# Patient Record
Sex: Female | Born: 1935 | Race: White | Hispanic: No | Smoking: Former smoker
Health system: Southern US, Community
[De-identification: ages and names within clinical notes are randomized; demographics above are authoritative.]

## PROBLEM LIST (undated history)

## (undated) DIAGNOSIS — R918 Other nonspecific abnormal finding of lung field: Secondary | ICD-10-CM

## (undated) DIAGNOSIS — I10 Essential (primary) hypertension: Secondary | ICD-10-CM

## (undated) DIAGNOSIS — K7689 Other specified diseases of liver: Secondary | ICD-10-CM

## (undated) DIAGNOSIS — Z923 Personal history of irradiation: Secondary | ICD-10-CM

## (undated) DIAGNOSIS — R0600 Dyspnea, unspecified: Secondary | ICD-10-CM

## (undated) DIAGNOSIS — H409 Unspecified glaucoma: Secondary | ICD-10-CM

## (undated) DIAGNOSIS — J449 Chronic obstructive pulmonary disease, unspecified: Secondary | ICD-10-CM

## (undated) DIAGNOSIS — R042 Hemoptysis: Secondary | ICD-10-CM

## (undated) DIAGNOSIS — C50919 Malignant neoplasm of unspecified site of unspecified female breast: Secondary | ICD-10-CM

## (undated) HISTORY — PX: GALLBLADDER SURGERY: SHX652

## (undated) HISTORY — PX: EYE SURGERY: SHX253

---

## 1958-10-25 HISTORY — PX: TONSILLECTOMY: SUR1361

## 1986-10-25 HISTORY — PX: CHOLECYSTECTOMY: SHX55

## 2003-10-26 DIAGNOSIS — C50919 Malignant neoplasm of unspecified site of unspecified female breast: Secondary | ICD-10-CM

## 2003-10-26 HISTORY — PX: BREAST EXCISIONAL BIOPSY: SUR124

## 2003-10-26 HISTORY — DX: Malignant neoplasm of unspecified site of unspecified female breast: C50.919

## 2004-03-25 HISTORY — PX: BREAST LUMPECTOMY: SHX2

## 2004-04-01 ENCOUNTER — Encounter: Admission: RE | Admit: 2004-04-01 | Discharge: 2004-04-01 | Payer: Self-pay | Admitting: *Deleted

## 2004-04-13 ENCOUNTER — Encounter (HOSPITAL_COMMUNITY): Admission: RE | Admit: 2004-04-13 | Discharge: 2004-07-12 | Payer: Self-pay | Admitting: Surgery

## 2004-04-20 ENCOUNTER — Ambulatory Visit (HOSPITAL_BASED_OUTPATIENT_CLINIC_OR_DEPARTMENT_OTHER): Admission: RE | Admit: 2004-04-20 | Discharge: 2004-04-20 | Payer: Self-pay | Admitting: Surgery

## 2004-04-20 ENCOUNTER — Encounter: Admission: RE | Admit: 2004-04-20 | Discharge: 2004-04-20 | Payer: Self-pay | Admitting: Surgery

## 2004-04-20 ENCOUNTER — Ambulatory Visit (HOSPITAL_COMMUNITY): Admission: RE | Admit: 2004-04-20 | Discharge: 2004-04-20 | Payer: Self-pay | Admitting: Surgery

## 2004-07-25 ENCOUNTER — Ambulatory Visit: Payer: Self-pay | Admitting: Internal Medicine

## 2004-10-07 ENCOUNTER — Ambulatory Visit: Payer: Self-pay | Admitting: Internal Medicine

## 2004-10-25 ENCOUNTER — Ambulatory Visit: Payer: Self-pay | Admitting: Internal Medicine

## 2004-11-12 ENCOUNTER — Encounter: Admission: RE | Admit: 2004-11-12 | Discharge: 2004-11-12 | Payer: Self-pay | Admitting: Surgery

## 2004-12-24 ENCOUNTER — Ambulatory Visit: Payer: Self-pay | Admitting: Internal Medicine

## 2005-01-23 ENCOUNTER — Ambulatory Visit: Payer: Self-pay | Admitting: Internal Medicine

## 2005-04-06 ENCOUNTER — Encounter: Admission: RE | Admit: 2005-04-06 | Discharge: 2005-04-06 | Payer: Self-pay | Admitting: Surgery

## 2005-05-04 ENCOUNTER — Ambulatory Visit: Payer: Self-pay | Admitting: Internal Medicine

## 2005-05-25 ENCOUNTER — Ambulatory Visit: Payer: Self-pay | Admitting: Internal Medicine

## 2005-07-01 ENCOUNTER — Ambulatory Visit: Payer: Self-pay | Admitting: Internal Medicine

## 2005-11-02 ENCOUNTER — Ambulatory Visit: Payer: Self-pay | Admitting: Internal Medicine

## 2005-11-25 ENCOUNTER — Ambulatory Visit: Payer: Self-pay | Admitting: Internal Medicine

## 2006-04-04 ENCOUNTER — Ambulatory Visit: Payer: Self-pay | Admitting: Oncology

## 2006-04-24 ENCOUNTER — Ambulatory Visit: Payer: Self-pay | Admitting: Oncology

## 2006-08-04 ENCOUNTER — Ambulatory Visit: Payer: Self-pay | Admitting: Radiation Oncology

## 2006-10-25 HISTORY — PX: JOINT REPLACEMENT: SHX530

## 2006-11-03 ENCOUNTER — Ambulatory Visit: Payer: Self-pay | Admitting: Oncology

## 2006-11-25 ENCOUNTER — Ambulatory Visit: Payer: Self-pay | Admitting: Oncology

## 2006-12-07 ENCOUNTER — Other Ambulatory Visit: Payer: Self-pay

## 2006-12-08 ENCOUNTER — Inpatient Hospital Stay: Payer: Self-pay | Admitting: Unknown Physician Specialty

## 2007-04-25 ENCOUNTER — Ambulatory Visit: Payer: Self-pay | Admitting: Internal Medicine

## 2007-05-12 ENCOUNTER — Ambulatory Visit: Payer: Self-pay | Admitting: Internal Medicine

## 2007-05-26 ENCOUNTER — Ambulatory Visit: Payer: Self-pay | Admitting: Internal Medicine

## 2007-07-26 ENCOUNTER — Ambulatory Visit: Payer: Self-pay | Admitting: Radiation Oncology

## 2007-08-17 ENCOUNTER — Ambulatory Visit: Payer: Self-pay | Admitting: Radiation Oncology

## 2007-08-26 ENCOUNTER — Ambulatory Visit: Payer: Self-pay | Admitting: Radiation Oncology

## 2007-10-26 ENCOUNTER — Ambulatory Visit: Payer: Self-pay | Admitting: Internal Medicine

## 2007-11-08 ENCOUNTER — Ambulatory Visit: Payer: Self-pay | Admitting: Internal Medicine

## 2007-11-26 ENCOUNTER — Ambulatory Visit: Payer: Self-pay | Admitting: Internal Medicine

## 2008-04-24 ENCOUNTER — Ambulatory Visit: Payer: Self-pay | Admitting: Internal Medicine

## 2008-05-25 ENCOUNTER — Ambulatory Visit: Payer: Self-pay | Admitting: Internal Medicine

## 2008-07-25 ENCOUNTER — Ambulatory Visit: Payer: Self-pay | Admitting: Radiation Oncology

## 2008-07-30 ENCOUNTER — Ambulatory Visit: Payer: Self-pay | Admitting: Internal Medicine

## 2008-08-25 ENCOUNTER — Ambulatory Visit: Payer: Self-pay | Admitting: Internal Medicine

## 2008-08-25 ENCOUNTER — Ambulatory Visit: Payer: Self-pay | Admitting: Radiation Oncology

## 2008-10-25 ENCOUNTER — Ambulatory Visit: Payer: Self-pay | Admitting: Internal Medicine

## 2008-11-05 ENCOUNTER — Ambulatory Visit: Payer: Self-pay | Admitting: Internal Medicine

## 2008-11-25 ENCOUNTER — Ambulatory Visit: Payer: Self-pay | Admitting: Internal Medicine

## 2009-07-25 ENCOUNTER — Ambulatory Visit: Payer: Self-pay | Admitting: Internal Medicine

## 2009-08-07 ENCOUNTER — Ambulatory Visit: Payer: Self-pay | Admitting: Internal Medicine

## 2009-08-25 ENCOUNTER — Ambulatory Visit: Payer: Self-pay | Admitting: Internal Medicine

## 2010-10-25 HISTORY — PX: COLONOSCOPY: SHX174

## 2011-07-06 ENCOUNTER — Ambulatory Visit: Payer: Self-pay | Admitting: Gastroenterology

## 2011-12-27 ENCOUNTER — Ambulatory Visit: Payer: Self-pay | Admitting: Internal Medicine

## 2012-03-28 DIAGNOSIS — L219 Seborrheic dermatitis, unspecified: Secondary | ICD-10-CM | POA: Insufficient documentation

## 2012-03-28 DIAGNOSIS — N189 Chronic kidney disease, unspecified: Secondary | ICD-10-CM | POA: Insufficient documentation

## 2012-07-25 ENCOUNTER — Ambulatory Visit: Payer: Self-pay | Admitting: Internal Medicine

## 2012-07-27 ENCOUNTER — Ambulatory Visit: Payer: Self-pay | Admitting: Internal Medicine

## 2013-03-15 ENCOUNTER — Ambulatory Visit: Payer: Self-pay | Admitting: Internal Medicine

## 2013-08-08 ENCOUNTER — Ambulatory Visit: Payer: Self-pay

## 2014-08-12 ENCOUNTER — Ambulatory Visit: Payer: Self-pay

## 2014-12-13 DIAGNOSIS — L920 Granuloma annulare: Secondary | ICD-10-CM | POA: Insufficient documentation

## 2015-05-27 ENCOUNTER — Other Ambulatory Visit: Payer: Self-pay | Admitting: Physician Assistant

## 2015-05-27 DIAGNOSIS — M5442 Lumbago with sciatica, left side: Secondary | ICD-10-CM

## 2015-05-27 DIAGNOSIS — R9389 Abnormal findings on diagnostic imaging of other specified body structures: Secondary | ICD-10-CM

## 2015-05-29 ENCOUNTER — Ambulatory Visit
Admission: RE | Admit: 2015-05-29 | Discharge: 2015-05-29 | Disposition: A | Discharge status: Home / Self Care | Payer: Medicare Other | Source: Ambulatory Visit | Attending: Physician Assistant | Admitting: Physician Assistant

## 2015-05-29 DIAGNOSIS — R934 Abnormal findings on diagnostic imaging of urinary organs: Secondary | ICD-10-CM | POA: Insufficient documentation

## 2015-05-29 DIAGNOSIS — M5442 Lumbago with sciatica, left side: Secondary | ICD-10-CM | POA: Insufficient documentation

## 2015-05-29 DIAGNOSIS — R938 Abnormal findings on diagnostic imaging of other specified body structures: Secondary | ICD-10-CM | POA: Diagnosis not present

## 2015-05-29 DIAGNOSIS — R9389 Abnormal findings on diagnostic imaging of other specified body structures: Secondary | ICD-10-CM

## 2015-06-02 ENCOUNTER — Other Ambulatory Visit: Payer: Self-pay | Admitting: Physician Assistant

## 2015-06-02 DIAGNOSIS — M5442 Lumbago with sciatica, left side: Secondary | ICD-10-CM

## 2015-06-03 ENCOUNTER — Ambulatory Visit
Admission: RE | Admit: 2015-06-03 | Discharge: 2015-06-03 | Disposition: A | Discharge status: Home / Self Care | Payer: Medicare Other | Source: Ambulatory Visit | Attending: Physician Assistant | Admitting: Physician Assistant

## 2015-06-03 ENCOUNTER — Other Ambulatory Visit: Payer: Self-pay | Admitting: Primary Care

## 2015-06-03 ENCOUNTER — Ambulatory Visit
Admission: RE | Admit: 2015-06-03 | Discharge: 2015-06-03 | Disposition: A | Discharge status: Home / Self Care | Payer: Medicare Other | Source: Ambulatory Visit | Attending: Primary Care | Admitting: Primary Care

## 2015-06-03 DIAGNOSIS — M5442 Lumbago with sciatica, left side: Secondary | ICD-10-CM

## 2015-06-03 DIAGNOSIS — M79605 Pain in left leg: Secondary | ICD-10-CM

## 2015-06-03 DIAGNOSIS — M5126 Other intervertebral disc displacement, lumbar region: Secondary | ICD-10-CM | POA: Diagnosis not present

## 2015-06-03 DIAGNOSIS — M5416 Radiculopathy, lumbar region: Secondary | ICD-10-CM | POA: Diagnosis not present

## 2015-06-16 DIAGNOSIS — M5432 Sciatica, left side: Secondary | ICD-10-CM | POA: Insufficient documentation

## 2015-06-18 ENCOUNTER — Other Ambulatory Visit: Payer: Self-pay | Admitting: Infectious Diseases

## 2015-06-18 DIAGNOSIS — Z1231 Encounter for screening mammogram for malignant neoplasm of breast: Secondary | ICD-10-CM

## 2015-08-14 ENCOUNTER — Other Ambulatory Visit: Payer: Self-pay | Admitting: Infectious Diseases

## 2015-08-14 ENCOUNTER — Ambulatory Visit
Admission: RE | Admit: 2015-08-14 | Discharge: 2015-08-14 | Disposition: A | Discharge status: Home / Self Care | Payer: Medicare Other | Source: Ambulatory Visit | Attending: Infectious Diseases | Admitting: Infectious Diseases

## 2015-08-14 DIAGNOSIS — Z1231 Encounter for screening mammogram for malignant neoplasm of breast: Secondary | ICD-10-CM

## 2015-08-14 HISTORY — DX: Malignant neoplasm of unspecified site of unspecified female breast: C50.919

## 2015-11-06 DIAGNOSIS — I10 Essential (primary) hypertension: Secondary | ICD-10-CM | POA: Diagnosis not present

## 2015-11-06 DIAGNOSIS — N2581 Secondary hyperparathyroidism of renal origin: Secondary | ICD-10-CM | POA: Diagnosis not present

## 2015-11-06 DIAGNOSIS — D631 Anemia in chronic kidney disease: Secondary | ICD-10-CM | POA: Diagnosis not present

## 2015-11-06 DIAGNOSIS — R319 Hematuria, unspecified: Secondary | ICD-10-CM | POA: Diagnosis not present

## 2015-11-06 DIAGNOSIS — N183 Chronic kidney disease, stage 3 (moderate): Secondary | ICD-10-CM | POA: Diagnosis not present

## 2015-11-14 DIAGNOSIS — E785 Hyperlipidemia, unspecified: Secondary | ICD-10-CM | POA: Diagnosis not present

## 2015-11-14 DIAGNOSIS — I129 Hypertensive chronic kidney disease with stage 1 through stage 4 chronic kidney disease, or unspecified chronic kidney disease: Secondary | ICD-10-CM | POA: Diagnosis not present

## 2015-11-14 DIAGNOSIS — N183 Chronic kidney disease, stage 3 (moderate): Secondary | ICD-10-CM | POA: Diagnosis not present

## 2015-12-15 DIAGNOSIS — E7800 Pure hypercholesterolemia, unspecified: Secondary | ICD-10-CM | POA: Diagnosis not present

## 2015-12-15 DIAGNOSIS — I10 Essential (primary) hypertension: Secondary | ICD-10-CM | POA: Diagnosis not present

## 2015-12-22 DIAGNOSIS — E7801 Familial hypercholesterolemia: Secondary | ICD-10-CM | POA: Diagnosis not present

## 2015-12-22 DIAGNOSIS — R7301 Impaired fasting glucose: Secondary | ICD-10-CM | POA: Diagnosis not present

## 2015-12-22 DIAGNOSIS — L409 Psoriasis, unspecified: Secondary | ICD-10-CM | POA: Diagnosis not present

## 2015-12-22 DIAGNOSIS — I10 Essential (primary) hypertension: Secondary | ICD-10-CM | POA: Diagnosis not present

## 2015-12-22 DIAGNOSIS — N183 Chronic kidney disease, stage 3 (moderate): Secondary | ICD-10-CM | POA: Diagnosis not present

## 2016-01-09 DIAGNOSIS — J40 Bronchitis, not specified as acute or chronic: Secondary | ICD-10-CM | POA: Diagnosis not present

## 2016-01-23 DIAGNOSIS — H40003 Preglaucoma, unspecified, bilateral: Secondary | ICD-10-CM | POA: Diagnosis not present

## 2016-03-29 DIAGNOSIS — M5136 Other intervertebral disc degeneration, lumbar region: Secondary | ICD-10-CM | POA: Diagnosis not present

## 2016-03-29 DIAGNOSIS — M9903 Segmental and somatic dysfunction of lumbar region: Secondary | ICD-10-CM | POA: Diagnosis not present

## 2016-03-29 DIAGNOSIS — M9905 Segmental and somatic dysfunction of pelvic region: Secondary | ICD-10-CM | POA: Diagnosis not present

## 2016-03-29 DIAGNOSIS — M5417 Radiculopathy, lumbosacral region: Secondary | ICD-10-CM | POA: Diagnosis not present

## 2016-03-30 DIAGNOSIS — M9905 Segmental and somatic dysfunction of pelvic region: Secondary | ICD-10-CM | POA: Diagnosis not present

## 2016-03-30 DIAGNOSIS — M5136 Other intervertebral disc degeneration, lumbar region: Secondary | ICD-10-CM | POA: Diagnosis not present

## 2016-03-30 DIAGNOSIS — M9903 Segmental and somatic dysfunction of lumbar region: Secondary | ICD-10-CM | POA: Diagnosis not present

## 2016-03-30 DIAGNOSIS — M5417 Radiculopathy, lumbosacral region: Secondary | ICD-10-CM | POA: Diagnosis not present

## 2016-04-02 DIAGNOSIS — M9903 Segmental and somatic dysfunction of lumbar region: Secondary | ICD-10-CM | POA: Diagnosis not present

## 2016-04-02 DIAGNOSIS — M5417 Radiculopathy, lumbosacral region: Secondary | ICD-10-CM | POA: Diagnosis not present

## 2016-04-02 DIAGNOSIS — M5136 Other intervertebral disc degeneration, lumbar region: Secondary | ICD-10-CM | POA: Diagnosis not present

## 2016-04-02 DIAGNOSIS — M9905 Segmental and somatic dysfunction of pelvic region: Secondary | ICD-10-CM | POA: Diagnosis not present

## 2016-04-05 DIAGNOSIS — M5136 Other intervertebral disc degeneration, lumbar region: Secondary | ICD-10-CM | POA: Diagnosis not present

## 2016-04-05 DIAGNOSIS — M9905 Segmental and somatic dysfunction of pelvic region: Secondary | ICD-10-CM | POA: Diagnosis not present

## 2016-04-05 DIAGNOSIS — M9903 Segmental and somatic dysfunction of lumbar region: Secondary | ICD-10-CM | POA: Diagnosis not present

## 2016-04-05 DIAGNOSIS — M5417 Radiculopathy, lumbosacral region: Secondary | ICD-10-CM | POA: Diagnosis not present

## 2016-04-07 DIAGNOSIS — M5136 Other intervertebral disc degeneration, lumbar region: Secondary | ICD-10-CM | POA: Diagnosis not present

## 2016-04-07 DIAGNOSIS — M9903 Segmental and somatic dysfunction of lumbar region: Secondary | ICD-10-CM | POA: Diagnosis not present

## 2016-04-07 DIAGNOSIS — M5417 Radiculopathy, lumbosacral region: Secondary | ICD-10-CM | POA: Diagnosis not present

## 2016-04-07 DIAGNOSIS — M9905 Segmental and somatic dysfunction of pelvic region: Secondary | ICD-10-CM | POA: Diagnosis not present

## 2016-04-09 DIAGNOSIS — M9903 Segmental and somatic dysfunction of lumbar region: Secondary | ICD-10-CM | POA: Diagnosis not present

## 2016-04-09 DIAGNOSIS — M5136 Other intervertebral disc degeneration, lumbar region: Secondary | ICD-10-CM | POA: Diagnosis not present

## 2016-04-09 DIAGNOSIS — M9905 Segmental and somatic dysfunction of pelvic region: Secondary | ICD-10-CM | POA: Diagnosis not present

## 2016-04-09 DIAGNOSIS — M5417 Radiculopathy, lumbosacral region: Secondary | ICD-10-CM | POA: Diagnosis not present

## 2016-04-19 DIAGNOSIS — M5136 Other intervertebral disc degeneration, lumbar region: Secondary | ICD-10-CM | POA: Diagnosis not present

## 2016-04-19 DIAGNOSIS — M5417 Radiculopathy, lumbosacral region: Secondary | ICD-10-CM | POA: Diagnosis not present

## 2016-04-19 DIAGNOSIS — M9903 Segmental and somatic dysfunction of lumbar region: Secondary | ICD-10-CM | POA: Diagnosis not present

## 2016-04-19 DIAGNOSIS — M9905 Segmental and somatic dysfunction of pelvic region: Secondary | ICD-10-CM | POA: Diagnosis not present

## 2016-04-21 DIAGNOSIS — M5417 Radiculopathy, lumbosacral region: Secondary | ICD-10-CM | POA: Diagnosis not present

## 2016-04-21 DIAGNOSIS — M9903 Segmental and somatic dysfunction of lumbar region: Secondary | ICD-10-CM | POA: Diagnosis not present

## 2016-04-21 DIAGNOSIS — M5136 Other intervertebral disc degeneration, lumbar region: Secondary | ICD-10-CM | POA: Diagnosis not present

## 2016-04-21 DIAGNOSIS — M9905 Segmental and somatic dysfunction of pelvic region: Secondary | ICD-10-CM | POA: Diagnosis not present

## 2016-04-23 DIAGNOSIS — M5136 Other intervertebral disc degeneration, lumbar region: Secondary | ICD-10-CM | POA: Diagnosis not present

## 2016-04-23 DIAGNOSIS — M5417 Radiculopathy, lumbosacral region: Secondary | ICD-10-CM | POA: Diagnosis not present

## 2016-04-23 DIAGNOSIS — M9905 Segmental and somatic dysfunction of pelvic region: Secondary | ICD-10-CM | POA: Diagnosis not present

## 2016-04-23 DIAGNOSIS — M9903 Segmental and somatic dysfunction of lumbar region: Secondary | ICD-10-CM | POA: Diagnosis not present

## 2016-04-26 DIAGNOSIS — M9905 Segmental and somatic dysfunction of pelvic region: Secondary | ICD-10-CM | POA: Diagnosis not present

## 2016-04-26 DIAGNOSIS — M5417 Radiculopathy, lumbosacral region: Secondary | ICD-10-CM | POA: Diagnosis not present

## 2016-04-26 DIAGNOSIS — M5136 Other intervertebral disc degeneration, lumbar region: Secondary | ICD-10-CM | POA: Diagnosis not present

## 2016-04-26 DIAGNOSIS — M9903 Segmental and somatic dysfunction of lumbar region: Secondary | ICD-10-CM | POA: Diagnosis not present

## 2016-04-28 DIAGNOSIS — M9903 Segmental and somatic dysfunction of lumbar region: Secondary | ICD-10-CM | POA: Diagnosis not present

## 2016-04-28 DIAGNOSIS — M9905 Segmental and somatic dysfunction of pelvic region: Secondary | ICD-10-CM | POA: Diagnosis not present

## 2016-04-28 DIAGNOSIS — M5417 Radiculopathy, lumbosacral region: Secondary | ICD-10-CM | POA: Diagnosis not present

## 2016-04-28 DIAGNOSIS — M5136 Other intervertebral disc degeneration, lumbar region: Secondary | ICD-10-CM | POA: Diagnosis not present

## 2016-04-30 DIAGNOSIS — M9903 Segmental and somatic dysfunction of lumbar region: Secondary | ICD-10-CM | POA: Diagnosis not present

## 2016-04-30 DIAGNOSIS — M9905 Segmental and somatic dysfunction of pelvic region: Secondary | ICD-10-CM | POA: Diagnosis not present

## 2016-04-30 DIAGNOSIS — M5417 Radiculopathy, lumbosacral region: Secondary | ICD-10-CM | POA: Diagnosis not present

## 2016-04-30 DIAGNOSIS — M5136 Other intervertebral disc degeneration, lumbar region: Secondary | ICD-10-CM | POA: Diagnosis not present

## 2016-05-03 DIAGNOSIS — M9903 Segmental and somatic dysfunction of lumbar region: Secondary | ICD-10-CM | POA: Diagnosis not present

## 2016-05-03 DIAGNOSIS — M5136 Other intervertebral disc degeneration, lumbar region: Secondary | ICD-10-CM | POA: Diagnosis not present

## 2016-05-03 DIAGNOSIS — M9905 Segmental and somatic dysfunction of pelvic region: Secondary | ICD-10-CM | POA: Diagnosis not present

## 2016-05-03 DIAGNOSIS — M5417 Radiculopathy, lumbosacral region: Secondary | ICD-10-CM | POA: Diagnosis not present

## 2016-05-05 DIAGNOSIS — M9903 Segmental and somatic dysfunction of lumbar region: Secondary | ICD-10-CM | POA: Diagnosis not present

## 2016-05-05 DIAGNOSIS — M5417 Radiculopathy, lumbosacral region: Secondary | ICD-10-CM | POA: Diagnosis not present

## 2016-05-05 DIAGNOSIS — M5136 Other intervertebral disc degeneration, lumbar region: Secondary | ICD-10-CM | POA: Diagnosis not present

## 2016-05-05 DIAGNOSIS — M9905 Segmental and somatic dysfunction of pelvic region: Secondary | ICD-10-CM | POA: Diagnosis not present

## 2016-05-07 DIAGNOSIS — M9905 Segmental and somatic dysfunction of pelvic region: Secondary | ICD-10-CM | POA: Diagnosis not present

## 2016-05-07 DIAGNOSIS — M5136 Other intervertebral disc degeneration, lumbar region: Secondary | ICD-10-CM | POA: Diagnosis not present

## 2016-05-07 DIAGNOSIS — M5417 Radiculopathy, lumbosacral region: Secondary | ICD-10-CM | POA: Diagnosis not present

## 2016-05-07 DIAGNOSIS — M9903 Segmental and somatic dysfunction of lumbar region: Secondary | ICD-10-CM | POA: Diagnosis not present

## 2016-05-10 DIAGNOSIS — N183 Chronic kidney disease, stage 3 (moderate): Secondary | ICD-10-CM | POA: Diagnosis not present

## 2016-05-10 DIAGNOSIS — N2581 Secondary hyperparathyroidism of renal origin: Secondary | ICD-10-CM | POA: Diagnosis not present

## 2016-05-10 DIAGNOSIS — M9903 Segmental and somatic dysfunction of lumbar region: Secondary | ICD-10-CM | POA: Diagnosis not present

## 2016-05-10 DIAGNOSIS — I10 Essential (primary) hypertension: Secondary | ICD-10-CM | POA: Diagnosis not present

## 2016-05-10 DIAGNOSIS — M5417 Radiculopathy, lumbosacral region: Secondary | ICD-10-CM | POA: Diagnosis not present

## 2016-05-10 DIAGNOSIS — M5136 Other intervertebral disc degeneration, lumbar region: Secondary | ICD-10-CM | POA: Diagnosis not present

## 2016-05-10 DIAGNOSIS — R319 Hematuria, unspecified: Secondary | ICD-10-CM | POA: Diagnosis not present

## 2016-05-10 DIAGNOSIS — D631 Anemia in chronic kidney disease: Secondary | ICD-10-CM | POA: Diagnosis not present

## 2016-05-10 DIAGNOSIS — M9905 Segmental and somatic dysfunction of pelvic region: Secondary | ICD-10-CM | POA: Diagnosis not present

## 2016-05-10 DIAGNOSIS — I129 Hypertensive chronic kidney disease with stage 1 through stage 4 chronic kidney disease, or unspecified chronic kidney disease: Secondary | ICD-10-CM | POA: Diagnosis not present

## 2016-05-12 DIAGNOSIS — M9905 Segmental and somatic dysfunction of pelvic region: Secondary | ICD-10-CM | POA: Diagnosis not present

## 2016-05-12 DIAGNOSIS — M9903 Segmental and somatic dysfunction of lumbar region: Secondary | ICD-10-CM | POA: Diagnosis not present

## 2016-05-12 DIAGNOSIS — M5136 Other intervertebral disc degeneration, lumbar region: Secondary | ICD-10-CM | POA: Diagnosis not present

## 2016-05-12 DIAGNOSIS — M5417 Radiculopathy, lumbosacral region: Secondary | ICD-10-CM | POA: Diagnosis not present

## 2016-05-14 DIAGNOSIS — N183 Chronic kidney disease, stage 3 (moderate): Secondary | ICD-10-CM | POA: Diagnosis not present

## 2016-05-14 DIAGNOSIS — E785 Hyperlipidemia, unspecified: Secondary | ICD-10-CM | POA: Diagnosis not present

## 2016-05-14 DIAGNOSIS — I129 Hypertensive chronic kidney disease with stage 1 through stage 4 chronic kidney disease, or unspecified chronic kidney disease: Secondary | ICD-10-CM | POA: Diagnosis not present

## 2016-05-17 DIAGNOSIS — M9905 Segmental and somatic dysfunction of pelvic region: Secondary | ICD-10-CM | POA: Diagnosis not present

## 2016-05-17 DIAGNOSIS — M9903 Segmental and somatic dysfunction of lumbar region: Secondary | ICD-10-CM | POA: Diagnosis not present

## 2016-05-17 DIAGNOSIS — M5136 Other intervertebral disc degeneration, lumbar region: Secondary | ICD-10-CM | POA: Diagnosis not present

## 2016-05-17 DIAGNOSIS — M5417 Radiculopathy, lumbosacral region: Secondary | ICD-10-CM | POA: Diagnosis not present

## 2016-05-19 DIAGNOSIS — M9903 Segmental and somatic dysfunction of lumbar region: Secondary | ICD-10-CM | POA: Diagnosis not present

## 2016-05-19 DIAGNOSIS — M5136 Other intervertebral disc degeneration, lumbar region: Secondary | ICD-10-CM | POA: Diagnosis not present

## 2016-05-19 DIAGNOSIS — M5417 Radiculopathy, lumbosacral region: Secondary | ICD-10-CM | POA: Diagnosis not present

## 2016-05-19 DIAGNOSIS — M9905 Segmental and somatic dysfunction of pelvic region: Secondary | ICD-10-CM | POA: Diagnosis not present

## 2016-05-24 DIAGNOSIS — M9903 Segmental and somatic dysfunction of lumbar region: Secondary | ICD-10-CM | POA: Diagnosis not present

## 2016-05-24 DIAGNOSIS — M5417 Radiculopathy, lumbosacral region: Secondary | ICD-10-CM | POA: Diagnosis not present

## 2016-05-24 DIAGNOSIS — M9905 Segmental and somatic dysfunction of pelvic region: Secondary | ICD-10-CM | POA: Diagnosis not present

## 2016-05-24 DIAGNOSIS — M5136 Other intervertebral disc degeneration, lumbar region: Secondary | ICD-10-CM | POA: Diagnosis not present

## 2016-05-31 DIAGNOSIS — M9903 Segmental and somatic dysfunction of lumbar region: Secondary | ICD-10-CM | POA: Diagnosis not present

## 2016-05-31 DIAGNOSIS — M9905 Segmental and somatic dysfunction of pelvic region: Secondary | ICD-10-CM | POA: Diagnosis not present

## 2016-05-31 DIAGNOSIS — M5417 Radiculopathy, lumbosacral region: Secondary | ICD-10-CM | POA: Diagnosis not present

## 2016-05-31 DIAGNOSIS — M5136 Other intervertebral disc degeneration, lumbar region: Secondary | ICD-10-CM | POA: Diagnosis not present

## 2016-06-08 DIAGNOSIS — M9903 Segmental and somatic dysfunction of lumbar region: Secondary | ICD-10-CM | POA: Diagnosis not present

## 2016-06-08 DIAGNOSIS — M5136 Other intervertebral disc degeneration, lumbar region: Secondary | ICD-10-CM | POA: Diagnosis not present

## 2016-06-08 DIAGNOSIS — M5417 Radiculopathy, lumbosacral region: Secondary | ICD-10-CM | POA: Diagnosis not present

## 2016-06-08 DIAGNOSIS — M9905 Segmental and somatic dysfunction of pelvic region: Secondary | ICD-10-CM | POA: Diagnosis not present

## 2016-06-15 DIAGNOSIS — E7801 Familial hypercholesterolemia: Secondary | ICD-10-CM | POA: Diagnosis not present

## 2016-06-15 DIAGNOSIS — I10 Essential (primary) hypertension: Secondary | ICD-10-CM | POA: Diagnosis not present

## 2016-06-15 DIAGNOSIS — R7301 Impaired fasting glucose: Secondary | ICD-10-CM | POA: Diagnosis not present

## 2016-06-15 DIAGNOSIS — L409 Psoriasis, unspecified: Secondary | ICD-10-CM | POA: Diagnosis not present

## 2016-06-15 DIAGNOSIS — N183 Chronic kidney disease, stage 3 (moderate): Secondary | ICD-10-CM | POA: Diagnosis not present

## 2016-06-22 ENCOUNTER — Other Ambulatory Visit: Payer: Self-pay | Admitting: Infectious Diseases

## 2016-06-22 DIAGNOSIS — N183 Chronic kidney disease, stage 3 (moderate): Secondary | ICD-10-CM | POA: Diagnosis not present

## 2016-06-22 DIAGNOSIS — Z0000 Encounter for general adult medical examination without abnormal findings: Secondary | ICD-10-CM | POA: Diagnosis not present

## 2016-06-22 DIAGNOSIS — R7301 Impaired fasting glucose: Secondary | ICD-10-CM | POA: Diagnosis not present

## 2016-06-22 DIAGNOSIS — Z1231 Encounter for screening mammogram for malignant neoplasm of breast: Secondary | ICD-10-CM

## 2016-06-22 DIAGNOSIS — M5432 Sciatica, left side: Secondary | ICD-10-CM | POA: Diagnosis not present

## 2016-06-22 DIAGNOSIS — Z1239 Encounter for other screening for malignant neoplasm of breast: Secondary | ICD-10-CM | POA: Diagnosis not present

## 2016-06-22 DIAGNOSIS — E7801 Familial hypercholesterolemia: Secondary | ICD-10-CM | POA: Diagnosis not present

## 2016-06-22 DIAGNOSIS — I10 Essential (primary) hypertension: Secondary | ICD-10-CM | POA: Diagnosis not present

## 2016-07-07 DIAGNOSIS — M9905 Segmental and somatic dysfunction of pelvic region: Secondary | ICD-10-CM | POA: Diagnosis not present

## 2016-07-07 DIAGNOSIS — M5136 Other intervertebral disc degeneration, lumbar region: Secondary | ICD-10-CM | POA: Diagnosis not present

## 2016-07-07 DIAGNOSIS — M9903 Segmental and somatic dysfunction of lumbar region: Secondary | ICD-10-CM | POA: Diagnosis not present

## 2016-07-07 DIAGNOSIS — M5417 Radiculopathy, lumbosacral region: Secondary | ICD-10-CM | POA: Diagnosis not present

## 2016-07-21 DIAGNOSIS — H40003 Preglaucoma, unspecified, bilateral: Secondary | ICD-10-CM | POA: Diagnosis not present

## 2016-07-26 DIAGNOSIS — H40003 Preglaucoma, unspecified, bilateral: Secondary | ICD-10-CM | POA: Diagnosis not present

## 2016-08-03 DIAGNOSIS — R200 Anesthesia of skin: Secondary | ICD-10-CM | POA: Diagnosis not present

## 2016-08-18 ENCOUNTER — Other Ambulatory Visit: Payer: Self-pay | Admitting: Infectious Diseases

## 2016-08-18 ENCOUNTER — Ambulatory Visit
Admission: RE | Admit: 2016-08-18 | Discharge: 2016-08-18 | Disposition: A | Discharge status: Home / Self Care | Payer: Medicare Other | Source: Ambulatory Visit | Attending: Infectious Diseases | Admitting: Infectious Diseases

## 2016-08-18 DIAGNOSIS — Z1231 Encounter for screening mammogram for malignant neoplasm of breast: Secondary | ICD-10-CM | POA: Diagnosis not present

## 2016-08-18 HISTORY — DX: Personal history of irradiation: Z92.3

## 2016-11-09 DIAGNOSIS — I129 Hypertensive chronic kidney disease with stage 1 through stage 4 chronic kidney disease, or unspecified chronic kidney disease: Secondary | ICD-10-CM | POA: Diagnosis not present

## 2016-11-09 DIAGNOSIS — N2581 Secondary hyperparathyroidism of renal origin: Secondary | ICD-10-CM | POA: Diagnosis not present

## 2016-11-09 DIAGNOSIS — D631 Anemia in chronic kidney disease: Secondary | ICD-10-CM | POA: Diagnosis not present

## 2016-11-09 DIAGNOSIS — R319 Hematuria, unspecified: Secondary | ICD-10-CM | POA: Diagnosis not present

## 2016-11-09 DIAGNOSIS — N183 Chronic kidney disease, stage 3 (moderate): Secondary | ICD-10-CM | POA: Diagnosis not present

## 2016-11-16 DIAGNOSIS — R809 Proteinuria, unspecified: Secondary | ICD-10-CM | POA: Diagnosis not present

## 2016-11-16 DIAGNOSIS — N183 Chronic kidney disease, stage 3 (moderate): Secondary | ICD-10-CM | POA: Diagnosis not present

## 2016-11-16 DIAGNOSIS — I129 Hypertensive chronic kidney disease with stage 1 through stage 4 chronic kidney disease, or unspecified chronic kidney disease: Secondary | ICD-10-CM | POA: Diagnosis not present

## 2016-11-16 DIAGNOSIS — R319 Hematuria, unspecified: Secondary | ICD-10-CM | POA: Diagnosis not present

## 2016-12-22 DIAGNOSIS — R7301 Impaired fasting glucose: Secondary | ICD-10-CM | POA: Diagnosis not present

## 2016-12-22 DIAGNOSIS — N183 Chronic kidney disease, stage 3 (moderate): Secondary | ICD-10-CM | POA: Diagnosis not present

## 2016-12-22 DIAGNOSIS — E7801 Familial hypercholesterolemia: Secondary | ICD-10-CM | POA: Diagnosis not present

## 2016-12-22 DIAGNOSIS — I10 Essential (primary) hypertension: Secondary | ICD-10-CM | POA: Diagnosis not present

## 2016-12-29 DIAGNOSIS — N183 Chronic kidney disease, stage 3 (moderate): Secondary | ICD-10-CM | POA: Diagnosis not present

## 2016-12-29 DIAGNOSIS — E7801 Familial hypercholesterolemia: Secondary | ICD-10-CM | POA: Diagnosis not present

## 2016-12-29 DIAGNOSIS — R7301 Impaired fasting glucose: Secondary | ICD-10-CM | POA: Diagnosis not present

## 2016-12-29 DIAGNOSIS — I10 Essential (primary) hypertension: Secondary | ICD-10-CM | POA: Diagnosis not present

## 2017-01-27 DIAGNOSIS — H40003 Preglaucoma, unspecified, bilateral: Secondary | ICD-10-CM | POA: Diagnosis not present

## 2017-05-17 DIAGNOSIS — D631 Anemia in chronic kidney disease: Secondary | ICD-10-CM | POA: Diagnosis not present

## 2017-05-17 DIAGNOSIS — N183 Chronic kidney disease, stage 3 (moderate): Secondary | ICD-10-CM | POA: Diagnosis not present

## 2017-05-17 DIAGNOSIS — I10 Essential (primary) hypertension: Secondary | ICD-10-CM | POA: Diagnosis not present

## 2017-05-17 DIAGNOSIS — R319 Hematuria, unspecified: Secondary | ICD-10-CM | POA: Diagnosis not present

## 2017-05-17 DIAGNOSIS — I129 Hypertensive chronic kidney disease with stage 1 through stage 4 chronic kidney disease, or unspecified chronic kidney disease: Secondary | ICD-10-CM | POA: Diagnosis not present

## 2017-05-17 DIAGNOSIS — N2581 Secondary hyperparathyroidism of renal origin: Secondary | ICD-10-CM | POA: Diagnosis not present

## 2017-05-23 DIAGNOSIS — I129 Hypertensive chronic kidney disease with stage 1 through stage 4 chronic kidney disease, or unspecified chronic kidney disease: Secondary | ICD-10-CM | POA: Diagnosis not present

## 2017-05-23 DIAGNOSIS — N183 Chronic kidney disease, stage 3 (moderate): Secondary | ICD-10-CM | POA: Diagnosis not present

## 2017-05-23 DIAGNOSIS — N2581 Secondary hyperparathyroidism of renal origin: Secondary | ICD-10-CM | POA: Diagnosis not present

## 2017-06-24 DIAGNOSIS — R7301 Impaired fasting glucose: Secondary | ICD-10-CM | POA: Diagnosis not present

## 2017-06-24 DIAGNOSIS — I10 Essential (primary) hypertension: Secondary | ICD-10-CM | POA: Diagnosis not present

## 2017-06-24 DIAGNOSIS — N183 Chronic kidney disease, stage 3 (moderate): Secondary | ICD-10-CM | POA: Diagnosis not present

## 2017-06-24 DIAGNOSIS — E7801 Familial hypercholesterolemia: Secondary | ICD-10-CM | POA: Diagnosis not present

## 2017-07-01 ENCOUNTER — Other Ambulatory Visit: Payer: Self-pay | Admitting: Infectious Diseases

## 2017-07-01 DIAGNOSIS — R053 Chronic cough: Secondary | ICD-10-CM

## 2017-07-01 DIAGNOSIS — R9389 Abnormal findings on diagnostic imaging of other specified body structures: Secondary | ICD-10-CM | POA: Insufficient documentation

## 2017-07-01 DIAGNOSIS — R938 Abnormal findings on diagnostic imaging of other specified body structures: Secondary | ICD-10-CM | POA: Diagnosis not present

## 2017-07-01 DIAGNOSIS — R05 Cough: Secondary | ICD-10-CM

## 2017-07-01 DIAGNOSIS — E7801 Familial hypercholesterolemia: Secondary | ICD-10-CM | POA: Diagnosis not present

## 2017-07-01 DIAGNOSIS — N183 Chronic kidney disease, stage 3 (moderate): Secondary | ICD-10-CM | POA: Diagnosis not present

## 2017-07-01 DIAGNOSIS — I10 Essential (primary) hypertension: Secondary | ICD-10-CM | POA: Diagnosis not present

## 2017-07-01 DIAGNOSIS — Z0000 Encounter for general adult medical examination without abnormal findings: Secondary | ICD-10-CM | POA: Diagnosis not present

## 2017-07-01 DIAGNOSIS — M5432 Sciatica, left side: Secondary | ICD-10-CM | POA: Diagnosis not present

## 2017-07-01 DIAGNOSIS — R7301 Impaired fasting glucose: Secondary | ICD-10-CM | POA: Diagnosis not present

## 2017-07-04 ENCOUNTER — Other Ambulatory Visit: Payer: Self-pay | Admitting: Infectious Diseases

## 2017-07-04 ENCOUNTER — Ambulatory Visit
Admission: RE | Admit: 2017-07-04 | Discharge: 2017-07-04 | Disposition: A | Discharge status: Home / Self Care | Payer: Medicare Other | Source: Ambulatory Visit | Attending: Infectious Diseases | Admitting: Infectious Diseases

## 2017-07-04 DIAGNOSIS — Z853 Personal history of malignant neoplasm of breast: Secondary | ICD-10-CM | POA: Insufficient documentation

## 2017-07-04 DIAGNOSIS — R05 Cough: Secondary | ICD-10-CM | POA: Diagnosis present

## 2017-07-04 DIAGNOSIS — R938 Abnormal findings on diagnostic imaging of other specified body structures: Secondary | ICD-10-CM | POA: Diagnosis present

## 2017-07-04 DIAGNOSIS — I700 Atherosclerosis of aorta: Secondary | ICD-10-CM | POA: Diagnosis not present

## 2017-07-04 DIAGNOSIS — J90 Pleural effusion, not elsewhere classified: Secondary | ICD-10-CM | POA: Diagnosis not present

## 2017-07-04 DIAGNOSIS — J9811 Atelectasis: Secondary | ICD-10-CM | POA: Insufficient documentation

## 2017-07-04 DIAGNOSIS — K769 Liver disease, unspecified: Secondary | ICD-10-CM | POA: Insufficient documentation

## 2017-07-04 DIAGNOSIS — R053 Chronic cough: Secondary | ICD-10-CM

## 2017-07-04 DIAGNOSIS — Z9889 Other specified postprocedural states: Secondary | ICD-10-CM | POA: Diagnosis not present

## 2017-07-04 DIAGNOSIS — I2510 Atherosclerotic heart disease of native coronary artery without angina pectoris: Secondary | ICD-10-CM | POA: Insufficient documentation

## 2017-07-04 DIAGNOSIS — Z1231 Encounter for screening mammogram for malignant neoplasm of breast: Secondary | ICD-10-CM

## 2017-07-04 DIAGNOSIS — I313 Pericardial effusion (noninflammatory): Secondary | ICD-10-CM | POA: Insufficient documentation

## 2017-07-04 DIAGNOSIS — R9389 Abnormal findings on diagnostic imaging of other specified body structures: Secondary | ICD-10-CM

## 2017-07-04 MED ORDER — IOPAMIDOL (ISOVUE-300) INJECTION 61%
60.0000 mL | Freq: Once | INTRAVENOUS | Status: AC | PRN
  Administered 2017-07-04: 60 mL via INTRAVENOUS

## 2017-07-08 ENCOUNTER — Ambulatory Visit
Admission: RE | Admit: 2017-07-08 | Discharge: 2017-07-08 | Disposition: A | Discharge status: Home / Self Care | Payer: Medicare Other | Source: Ambulatory Visit | Attending: Infectious Diseases | Admitting: Infectious Diseases

## 2017-07-08 DIAGNOSIS — K7689 Other specified diseases of liver: Secondary | ICD-10-CM | POA: Insufficient documentation

## 2017-07-08 DIAGNOSIS — K769 Liver disease, unspecified: Secondary | ICD-10-CM | POA: Diagnosis present

## 2017-07-08 MED ORDER — GADOBENATE DIMEGLUMINE 529 MG/ML IV SOLN
10.0000 mL | Freq: Once | INTRAVENOUS | Status: AC | PRN
  Administered 2017-07-08: 8 mL via INTRAVENOUS

## 2017-07-20 DIAGNOSIS — R0989 Other specified symptoms and signs involving the circulatory and respiratory systems: Secondary | ICD-10-CM | POA: Diagnosis not present

## 2017-07-26 DIAGNOSIS — H40003 Preglaucoma, unspecified, bilateral: Secondary | ICD-10-CM | POA: Diagnosis not present

## 2017-08-02 DIAGNOSIS — H40053 Ocular hypertension, bilateral: Secondary | ICD-10-CM | POA: Diagnosis not present

## 2017-08-05 DIAGNOSIS — I10 Essential (primary) hypertension: Secondary | ICD-10-CM | POA: Diagnosis not present

## 2017-08-05 DIAGNOSIS — R053 Chronic cough: Secondary | ICD-10-CM | POA: Insufficient documentation

## 2017-08-05 DIAGNOSIS — R05 Cough: Secondary | ICD-10-CM | POA: Insufficient documentation

## 2017-08-05 DIAGNOSIS — R9389 Abnormal findings on diagnostic imaging of other specified body structures: Secondary | ICD-10-CM | POA: Diagnosis not present

## 2017-08-05 DIAGNOSIS — J9811 Atelectasis: Secondary | ICD-10-CM | POA: Diagnosis not present

## 2017-08-05 DIAGNOSIS — R0609 Other forms of dyspnea: Secondary | ICD-10-CM | POA: Diagnosis not present

## 2017-08-19 ENCOUNTER — Ambulatory Visit
Admission: RE | Admit: 2017-08-19 | Discharge: 2017-08-19 | Disposition: A | Discharge status: Home / Self Care | Payer: Medicare Other | Source: Ambulatory Visit | Attending: Infectious Diseases | Admitting: Infectious Diseases

## 2017-08-19 DIAGNOSIS — R0609 Other forms of dyspnea: Secondary | ICD-10-CM | POA: Diagnosis not present

## 2017-08-19 DIAGNOSIS — Z1231 Encounter for screening mammogram for malignant neoplasm of breast: Secondary | ICD-10-CM | POA: Diagnosis not present

## 2017-08-25 DIAGNOSIS — J449 Chronic obstructive pulmonary disease, unspecified: Secondary | ICD-10-CM | POA: Diagnosis not present

## 2017-08-25 DIAGNOSIS — R0602 Shortness of breath: Secondary | ICD-10-CM | POA: Diagnosis not present

## 2017-08-25 DIAGNOSIS — J310 Chronic rhinitis: Secondary | ICD-10-CM | POA: Diagnosis not present

## 2017-09-20 DIAGNOSIS — R0609 Other forms of dyspnea: Secondary | ICD-10-CM | POA: Diagnosis not present

## 2017-09-20 DIAGNOSIS — R05 Cough: Secondary | ICD-10-CM | POA: Diagnosis not present

## 2017-09-20 DIAGNOSIS — J449 Chronic obstructive pulmonary disease, unspecified: Secondary | ICD-10-CM | POA: Diagnosis not present

## 2017-10-24 DIAGNOSIS — R05 Cough: Secondary | ICD-10-CM | POA: Diagnosis not present

## 2017-10-24 DIAGNOSIS — I10 Essential (primary) hypertension: Secondary | ICD-10-CM | POA: Diagnosis not present

## 2017-10-24 DIAGNOSIS — N183 Chronic kidney disease, stage 3 (moderate): Secondary | ICD-10-CM | POA: Diagnosis not present

## 2017-10-24 DIAGNOSIS — R9389 Abnormal findings on diagnostic imaging of other specified body structures: Secondary | ICD-10-CM | POA: Diagnosis not present

## 2017-10-25 DIAGNOSIS — R042 Hemoptysis: Secondary | ICD-10-CM

## 2017-10-25 HISTORY — DX: Hemoptysis: R04.2

## 2017-12-07 DIAGNOSIS — R0902 Hypoxemia: Secondary | ICD-10-CM | POA: Diagnosis not present

## 2017-12-07 DIAGNOSIS — R0602 Shortness of breath: Secondary | ICD-10-CM | POA: Diagnosis not present

## 2017-12-07 DIAGNOSIS — J439 Emphysema, unspecified: Secondary | ICD-10-CM | POA: Diagnosis not present

## 2017-12-20 DIAGNOSIS — R9389 Abnormal findings on diagnostic imaging of other specified body structures: Secondary | ICD-10-CM | POA: Diagnosis not present

## 2017-12-20 DIAGNOSIS — R05 Cough: Secondary | ICD-10-CM | POA: Diagnosis not present

## 2017-12-20 DIAGNOSIS — R042 Hemoptysis: Secondary | ICD-10-CM | POA: Diagnosis not present

## 2017-12-28 DIAGNOSIS — I10 Essential (primary) hypertension: Secondary | ICD-10-CM | POA: Diagnosis not present

## 2017-12-28 DIAGNOSIS — R42 Dizziness and giddiness: Secondary | ICD-10-CM | POA: Diagnosis not present

## 2017-12-28 DIAGNOSIS — E7801 Familial hypercholesterolemia: Secondary | ICD-10-CM | POA: Diagnosis not present

## 2017-12-28 DIAGNOSIS — R05 Cough: Secondary | ICD-10-CM | POA: Diagnosis not present

## 2017-12-28 DIAGNOSIS — J42 Unspecified chronic bronchitis: Secondary | ICD-10-CM | POA: Diagnosis not present

## 2017-12-28 DIAGNOSIS — N183 Chronic kidney disease, stage 3 (moderate): Secondary | ICD-10-CM | POA: Diagnosis not present

## 2017-12-28 DIAGNOSIS — R7301 Impaired fasting glucose: Secondary | ICD-10-CM | POA: Diagnosis not present

## 2018-01-02 DIAGNOSIS — R7301 Impaired fasting glucose: Secondary | ICD-10-CM | POA: Diagnosis not present

## 2018-01-02 DIAGNOSIS — K7689 Other specified diseases of liver: Secondary | ICD-10-CM | POA: Diagnosis not present

## 2018-01-02 DIAGNOSIS — R911 Solitary pulmonary nodule: Secondary | ICD-10-CM | POA: Diagnosis not present

## 2018-01-02 DIAGNOSIS — N183 Chronic kidney disease, stage 3 (moderate): Secondary | ICD-10-CM | POA: Diagnosis not present

## 2018-01-02 DIAGNOSIS — R9389 Abnormal findings on diagnostic imaging of other specified body structures: Secondary | ICD-10-CM | POA: Diagnosis not present

## 2018-01-02 DIAGNOSIS — R634 Abnormal weight loss: Secondary | ICD-10-CM | POA: Diagnosis not present

## 2018-01-02 DIAGNOSIS — E7801 Familial hypercholesterolemia: Secondary | ICD-10-CM | POA: Diagnosis not present

## 2018-01-02 DIAGNOSIS — I10 Essential (primary) hypertension: Secondary | ICD-10-CM | POA: Diagnosis not present

## 2018-01-03 DIAGNOSIS — R911 Solitary pulmonary nodule: Secondary | ICD-10-CM | POA: Diagnosis not present

## 2018-01-03 DIAGNOSIS — R0602 Shortness of breath: Secondary | ICD-10-CM | POA: Diagnosis not present

## 2018-01-03 DIAGNOSIS — R634 Abnormal weight loss: Secondary | ICD-10-CM | POA: Diagnosis not present

## 2018-01-05 ENCOUNTER — Other Ambulatory Visit: Payer: Self-pay | Admitting: Specialist

## 2018-01-05 DIAGNOSIS — R0602 Shortness of breath: Secondary | ICD-10-CM

## 2018-01-16 ENCOUNTER — Ambulatory Visit
Admission: RE | Admit: 2018-01-16 | Discharge: 2018-01-16 | Disposition: A | Discharge status: Home / Self Care | Payer: Medicare Other | Source: Ambulatory Visit | Attending: Specialist | Admitting: Specialist

## 2018-01-16 DIAGNOSIS — J439 Emphysema, unspecified: Secondary | ICD-10-CM | POA: Diagnosis not present

## 2018-01-16 DIAGNOSIS — R911 Solitary pulmonary nodule: Secondary | ICD-10-CM | POA: Insufficient documentation

## 2018-01-16 DIAGNOSIS — K449 Diaphragmatic hernia without obstruction or gangrene: Secondary | ICD-10-CM | POA: Insufficient documentation

## 2018-01-16 DIAGNOSIS — J90 Pleural effusion, not elsewhere classified: Secondary | ICD-10-CM | POA: Diagnosis not present

## 2018-01-16 DIAGNOSIS — R0602 Shortness of breath: Secondary | ICD-10-CM | POA: Diagnosis present

## 2018-01-16 DIAGNOSIS — R634 Abnormal weight loss: Secondary | ICD-10-CM | POA: Diagnosis present

## 2018-01-16 DIAGNOSIS — R918 Other nonspecific abnormal finding of lung field: Secondary | ICD-10-CM | POA: Insufficient documentation

## 2018-01-16 DIAGNOSIS — I700 Atherosclerosis of aorta: Secondary | ICD-10-CM | POA: Insufficient documentation

## 2018-01-16 DIAGNOSIS — I2510 Atherosclerotic heart disease of native coronary artery without angina pectoris: Secondary | ICD-10-CM | POA: Insufficient documentation

## 2018-01-18 ENCOUNTER — Other Ambulatory Visit: Payer: Self-pay | Admitting: Specialist

## 2018-01-18 DIAGNOSIS — R918 Other nonspecific abnormal finding of lung field: Secondary | ICD-10-CM

## 2018-01-18 DIAGNOSIS — J439 Emphysema, unspecified: Secondary | ICD-10-CM | POA: Diagnosis not present

## 2018-01-23 DIAGNOSIS — R918 Other nonspecific abnormal finding of lung field: Secondary | ICD-10-CM

## 2018-01-23 HISTORY — DX: Other nonspecific abnormal finding of lung field: R91.8

## 2018-01-24 ENCOUNTER — Ambulatory Visit
Admission: RE | Admit: 2018-01-24 | Discharge: 2018-01-24 | Disposition: A | Discharge status: Home / Self Care | Payer: Medicare Other | Source: Ambulatory Visit | Attending: Specialist | Admitting: Specialist

## 2018-01-24 DIAGNOSIS — K7689 Other specified diseases of liver: Secondary | ICD-10-CM | POA: Diagnosis not present

## 2018-01-24 DIAGNOSIS — J90 Pleural effusion, not elsewhere classified: Secondary | ICD-10-CM | POA: Insufficient documentation

## 2018-01-24 DIAGNOSIS — J439 Emphysema, unspecified: Secondary | ICD-10-CM | POA: Insufficient documentation

## 2018-01-24 DIAGNOSIS — R918 Other nonspecific abnormal finding of lung field: Secondary | ICD-10-CM | POA: Insufficient documentation

## 2018-01-24 DIAGNOSIS — I700 Atherosclerosis of aorta: Secondary | ICD-10-CM | POA: Insufficient documentation

## 2018-01-24 DIAGNOSIS — N281 Cyst of kidney, acquired: Secondary | ICD-10-CM | POA: Insufficient documentation

## 2018-01-24 DIAGNOSIS — I2510 Atherosclerotic heart disease of native coronary artery without angina pectoris: Secondary | ICD-10-CM | POA: Insufficient documentation

## 2018-01-24 LAB — GLUCOSE, CAPILLARY: Glucose-Capillary: 106 mg/dL — ABNORMAL HIGH (ref 65–99)

## 2018-01-24 MED ORDER — FLUDEOXYGLUCOSE F - 18 (FDG) INJECTION
8.6300 | Freq: Once | INTRAVENOUS | Status: AC | PRN
  Administered 2018-01-24: 8.63 via INTRAVENOUS

## 2018-01-25 DIAGNOSIS — R918 Other nonspecific abnormal finding of lung field: Secondary | ICD-10-CM | POA: Diagnosis not present

## 2018-01-25 DIAGNOSIS — R05 Cough: Secondary | ICD-10-CM | POA: Diagnosis not present

## 2018-01-31 DIAGNOSIS — H40053 Ocular hypertension, bilateral: Secondary | ICD-10-CM | POA: Diagnosis not present

## 2018-02-08 ENCOUNTER — Ambulatory Visit (INDEPENDENT_AMBULATORY_CARE_PROVIDER_SITE_OTHER): Payer: Medicare Other | Admitting: Internal Medicine

## 2018-02-08 ENCOUNTER — Telehealth: Payer: Self-pay | Admitting: *Deleted

## 2018-02-08 ENCOUNTER — Encounter: Payer: Self-pay | Admitting: Internal Medicine

## 2018-02-08 VITALS — BP 116/62 | HR 91 | Resp 16 | Ht 64.0 in | Wt 155.0 lb

## 2018-02-08 DIAGNOSIS — J449 Chronic obstructive pulmonary disease, unspecified: Secondary | ICD-10-CM | POA: Diagnosis not present

## 2018-02-08 DIAGNOSIS — E785 Hyperlipidemia, unspecified: Secondary | ICD-10-CM | POA: Insufficient documentation

## 2018-02-08 DIAGNOSIS — R918 Other nonspecific abnormal finding of lung field: Secondary | ICD-10-CM

## 2018-02-08 DIAGNOSIS — I10 Essential (primary) hypertension: Secondary | ICD-10-CM | POA: Insufficient documentation

## 2018-02-08 MED ORDER — UMECLIDINIUM-VILANTEROL 62.5-25 MCG/INH IN AEPB: 1.0000 | INHALATION_SPRAY | Freq: Every day | RESPIRATORY_TRACT | 0 refills | Status: DC

## 2018-02-08 NOTE — Addendum Note (Signed)
Addended by: Stephanie Coup on: 02/08/2018 12:11 PM   Modules accepted: Orders

## 2018-02-08 NOTE — H&P (View-Only) (Signed)
[facility] Consultation      Assessment and Plan:  Right hilar lung mass with right hilar lymphadenopathy. -Suspicious for cancer, will plan for EBUS bronchoscopy with biopsies. --Will call pt's son with results of biopsy.  --Pt will follow up with Dr. Raul Del afterwards.   COPD with dyspnea on exertion. - Mild dyspnea on exertion, patient is mildly symptomatic, though she has significant emphysematous changes on imaging. -She uses albuterol MDI occasionally with minimal relief.  She is given a sample of Anoro inhaler to use once daily until the plan of bronchoscopy.  Date: 02/08/2018  MRN# [id number] Brenda Parker 16-Jul-1936    Brenda Parker is a 82 y.o. old female seen in consultation for chief complaint of:    Chief Complaint  Patient presents with  . Consult    Referred by Dr. Raul Del for eval of lung mass possible bronch.RUL central lung mass    HPI:  She has a history of breast cancer, several years ago on the right breast and received radiation, it was found in June of 2005.  She coughed up some blood a few weeks ago, got a course of abx, and symptoms improved. Imaging showed a mass, she started coughing up blood yesterday.  She has COPD has an albuterol inhaler which she uses about once per day but does not think that it does not do anything. 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 She lives by herself and does her own ADL's.  She smoked until 2007, was smoking about a ppd.  No known cardiac issues.  Denies reflux, sinus drainage other than upon waking.   Additional surgical history:  Colonoscopy in 2012. Left hip replaced in 2008,  Cholecystectomy in 1988.  Tonsillectomy 1960.   Imaging personally reviewed, CT chest 01/16/18, there is right upper lobe scattered infiltrate, with volume loss and partial atelectasis.  There is a cut off at the right upper lobe bronchus, and narrowing of the bronchus intermedius, suggestive of endobronchial  lesions.  There is a right hilar mass, possible subcarinal lymphadenopathy, there is a mass in the liver, described by the radiologist as benign likely liver cyst.  PET scan 01/24/18, right suprahilar mass with SUV of 20, satellite lesions in the right upper lobe which were hypermetabolic and right lower lobe.  No extra pulmonary hypermetabolic findings. MRI 07/08/17; imaging in the liver consistent with liver cysts.   PMHX:   Past Medical History:  Diagnosis Date  . Breast cancer [facility]) 2005   right breast  . Personal history of radiation therapy    Surgical Hx:  Past Surgical History:  Procedure Laterality Date  . BREAST EXCISIONAL BIOPSY Right 2005   positive, radiation   Family Hx:  Family History  Problem Relation Age of Onset  . Breast cancer Mother 62   Social Hx:   Social History   Tobacco Use  . Smoking status: Former Research scientist (life sciences)  . Smokeless tobacco: Never Used  Substance Use Topics  . Alcohol use: Yes  . Drug use: Never   Medication:    Current Outpatient Medications:  .  albuterol (PROVENTIL HFA) 108 (90 Base) MCG/ACT inhaler, Inhale 2 puffs into the lungs as needed., Disp: , Rfl:  .  aspirin EC 81 MG tablet, Take 81 mg by mouth daily., Disp: , Rfl:  .  Cholecalciferol (VITAMIN D3) 3000 units TABS, Take 1 tablet by mouth daily., Disp: , Rfl:  .  latanoprost (XALATAN) 0.005 % ophthalmic solution, Place 1  drop into both eyes at bedtime., Disp: , Rfl:  .  simvastatin (ZOCOR) 40 MG tablet, Take 40 mg by mouth at bedtime., Disp: , Rfl:    Allergies:  Penicillins  Review of Systems: Gen:  Denies  fever, sweats, chills HEENT: Denies blurred vision, double vision. bleeds, sore throat Cvc:  No dizziness, chest pain. Resp:   Denies cough or sputum production, shortness of breath Gi: Denies swallowing difficulty, stomach pain. Gu:  Denies bladder incontinence, burning urine Ext:   No Joint pain, stiffness. Skin: No skin rash,  hives  Endoc:  No polyuria,  polydipsia. Psych: No depression, insomnia. Other:  All other systems were reviewed with the patient and were negative other that what is mentioned in the HPI.   Physical Examination:   VS: BP 116/62 (BP Location: Left Arm, Cuff Size: Normal)   Pulse 91   Resp 16   Ht 5\' 4"  (1.626 m)   Wt 155 lb (70.3 kg)   SpO2 97%   BMI 26.61 kg/m   General Appearance: No distress  Neuro:without focal findings,  speech normal,  HEENT: PERRLA, EOM intact.   Pulmonary: Decreased air entry in the right lung., No wheezing.  CardiovascularNormal S1,S2.  No m/r/g.   Abdomen: Benign, Soft, non-tender. Renal:  No costovertebral tenderness  GU:  No performed at this time. Endoc: No evident thyromegaly, no signs of acromegaly. Skin:   warm, no rashes, no ecchymosis  Extremities: normal, no cyanosis, clubbing.  Other findings:    LABORATORY PANEL:   CBC No results for input(s): WBC, HGB, HCT, PLT in the last 168 hours. ------------------------------------------------------------------------------------------------------------------  Chemistries  No results for input(s): NA, K, CL, CO2, GLUCOSE, BUN, CREATININE, CALCIUM, MG, AST, ALT, ALKPHOS, BILITOT in the last 168 hours.  Invalid input(s): GFRCGP ------------------------------------------------------------------------------------------------------------------  Cardiac Enzymes No results for input(s): TROPONINI in the last 168 hours. ------------------------------------------------------------  RADIOLOGY:  No results found.     Thank  you for the consultation and for allowing [facility] to assist in the care of your patient. Our recommendations are noted above.  Please contact us if we can be of further service.   Marda Stalker, MD.  Board Certified in Internal Medicine, Pulmonary Medicine, [place], and Sleep Medicine.  [facility] Office Number: [phone]  Patricia Pesa, M.D.  Merton Border, M.D  02/08/2018

## 2018-02-08 NOTE — Telephone Encounter (Signed)
Patient scheduled for EBUS on 4/30. CPT: [id number]....[id number] DX. Lung Mass Dr. Ashby Dawes

## 2018-02-08 NOTE — Patient Instructions (Addendum)
Will scheduled you for bronchoscopy.  Will start on Anoro once daily until bronchoscopy (will give sample).

## 2018-02-08 NOTE — Progress Notes (Signed)
[facility] Consultation      Assessment and Plan:  Right hilar lung mass with right hilar lymphadenopathy. -Suspicious for cancer, will plan for EBUS bronchoscopy with biopsies. --Will call pt's son with results of biopsy.  --Pt will follow up with Dr. Raul Del afterwards.   COPD with dyspnea on exertion. - Mild dyspnea on exertion, patient is mildly symptomatic, though she has significant emphysematous changes on imaging. -She uses albuterol MDI occasionally with minimal relief.  She is given a sample of Anoro inhaler to use once daily until the plan of bronchoscopy.  Date: 02/08/2018  MRN# [id number] Brenda Parker 06-19-1936    Brenda Parker is a 82 y.o. old female seen in consultation for chief complaint of:    Chief Complaint  Patient presents with  . Consult    Referred by Dr. Raul Del for eval of lung mass possible bronch.RUL central lung mass    HPI:  She has a history of breast cancer, several years ago on the right breast and received radiation, it was found in June of 2005.  She coughed up some blood a few weeks ago, got a course of abx, and symptoms improved. Imaging showed a mass, she started coughing up blood yesterday.  She has COPD has an albuterol inhaler which she uses about once per day but does not think that it does not do anything. 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 She lives by herself and does her own ADL's.  She smoked until 2007, was smoking about a ppd.  No known cardiac issues.  Denies reflux, sinus drainage other than upon waking.   Additional surgical history:  Colonoscopy in 2012. Left hip replaced in 2008,  Cholecystectomy in 1988.  Tonsillectomy 1960.   Imaging personally reviewed, CT chest 01/16/18, there is right upper lobe scattered infiltrate, with volume loss and partial atelectasis.  There is a cut off at the right upper lobe bronchus, and narrowing of the bronchus intermedius, suggestive of endobronchial  lesions.  There is a right hilar mass, possible subcarinal lymphadenopathy, there is a mass in the liver, described by the radiologist as benign likely liver cyst.  PET scan 01/24/18, right suprahilar mass with SUV of 20, satellite lesions in the right upper lobe which were hypermetabolic and right lower lobe.  No extra pulmonary hypermetabolic findings. MRI 07/08/17; imaging in the liver consistent with liver cysts.   PMHX:   Past Medical History:  Diagnosis Date  . Breast cancer [facility]) 2005   right breast  . Personal history of radiation therapy    Surgical Hx:  Past Surgical History:  Procedure Laterality Date  . BREAST EXCISIONAL BIOPSY Right 2005   positive, radiation   Family Hx:  Family History  Problem Relation Age of Onset  . Breast cancer Mother 55   Social Hx:   Social History   Tobacco Use  . Smoking status: Former Research scientist (life sciences)  . Smokeless tobacco: Never Used  Substance Use Topics  . Alcohol use: Yes  . Drug use: Never   Medication:    Current Outpatient Medications:  .  albuterol (PROVENTIL HFA) 108 (90 Base) MCG/ACT inhaler, Inhale 2 puffs into the lungs as needed., Disp: , Rfl:  .  aspirin EC 81 MG tablet, Take 81 mg by mouth daily., Disp: , Rfl:  .  Cholecalciferol (VITAMIN D3) 3000 units TABS, Take 1 tablet by mouth daily., Disp: , Rfl:  .  latanoprost (XALATAN) 0.005 % ophthalmic solution, Place 1  drop into both eyes at bedtime., Disp: , Rfl:  .  simvastatin (ZOCOR) 40 MG tablet, Take 40 mg by mouth at bedtime., Disp: , Rfl:    Allergies:  Penicillins  Review of Systems: Gen:  Denies  fever, sweats, chills HEENT: Denies blurred vision, double vision. bleeds, sore throat Cvc:  No dizziness, chest pain. Resp:   Denies cough or sputum production, shortness of breath Gi: Denies swallowing difficulty, stomach pain. Gu:  Denies bladder incontinence, burning urine Ext:   No Joint pain, stiffness. Skin: No skin rash,  hives  Endoc:  No polyuria,  polydipsia. Psych: No depression, insomnia. Other:  All other systems were reviewed with the patient and were negative other that what is mentioned in the HPI.   Physical Examination:   VS: BP 116/62 (BP Location: Left Arm, Cuff Size: Normal)   Pulse 91   Resp 16   Ht 5\' 4"  (1.626 m)   Wt 155 lb (70.3 kg)   SpO2 97%   BMI 26.61 kg/m   General Appearance: No distress  Neuro:without focal findings,  speech normal,  HEENT: PERRLA, EOM intact.   Pulmonary: Decreased air entry in the right lung., No wheezing.  CardiovascularNormal S1,S2.  No m/r/g.   Abdomen: Benign, Soft, non-tender. Renal:  No costovertebral tenderness  GU:  No performed at this time. Endoc: No evident thyromegaly, no signs of acromegaly. Skin:   warm, no rashes, no ecchymosis  Extremities: normal, no cyanosis, clubbing.  Other findings:    LABORATORY PANEL:   CBC No results for input(s): WBC, HGB, HCT, PLT in the last 168 hours. ------------------------------------------------------------------------------------------------------------------  Chemistries  No results for input(s): NA, K, CL, CO2, GLUCOSE, BUN, CREATININE, CALCIUM, MG, AST, ALT, ALKPHOS, BILITOT in the last 168 hours.  Invalid input(s): GFRCGP ------------------------------------------------------------------------------------------------------------------  Cardiac Enzymes No results for input(s): TROPONINI in the last 168 hours. ------------------------------------------------------------  RADIOLOGY:  No results found.     Thank  you for the consultation and for allowing [facility] to assist in the care of your patient. Our recommendations are noted above.  Please contact us if we can be of further service.   Marda Stalker, MD.  Board Certified in Internal Medicine, Pulmonary Medicine, [place], and Sleep Medicine.  [facility] Office Number: [phone]  Patricia Pesa, M.D.  Merton Border, M.D  02/08/2018

## 2018-02-09 NOTE — Telephone Encounter (Signed)
No PA required with [facility] or [employer] for EBUS. Procedure Codes [phone] or [phone] as long as done outpatient. Rhonda J Cobb

## 2018-02-10 ENCOUNTER — Telehealth: Payer: Self-pay | Admitting: Internal Medicine

## 2018-02-10 MED ORDER — UMECLIDINIUM-VILANTEROL 62.5-25 MCG/INH IN AEPB: 1.0000 | INHALATION_SPRAY | Freq: Every day | RESPIRATORY_TRACT | 0 refills | Status: AC

## 2018-02-10 NOTE — Telephone Encounter (Signed)
Pt is aware of below message and voiced his understanding.  One sample of Anoro has been placed up front for pick up. Nothing further is needed.

## 2018-02-10 NOTE — Telephone Encounter (Signed)
Pt states she has a bronc scheduled for 02/21/18 She states she was given samples of Anoro, but only has 1 box of it and it only has about 7 days worth in it. She is asking for another box. Also she states she saw on Tv people with Glaucoma should not be taking this  Would like advise on this as well   Please call back

## 2018-02-10 NOTE — Telephone Encounter (Signed)
It is ok for her to take Anoro, even with glaucoma, especially since this is a short course. Ok to give another sample to last until bronch.

## 2018-02-10 NOTE — Telephone Encounter (Signed)
Called and spoke to pt.  Pt states she is scheduled for bronch 02/21/18. Pt states she was provided with a sample of Anoro and was instructed to take this medication until Broch.  Pt states she has 1 box of Anoro left and will run out before bronch.  She also states she seen on TV where people with Glaucoma should not take this medication.  Will leave sample of Anoro up front after receiving DR response.   DR please advise. thanks

## 2018-02-14 ENCOUNTER — Other Ambulatory Visit: Payer: Self-pay

## 2018-02-14 ENCOUNTER — Encounter
Admission: RE | Admit: 2018-02-14 | Discharge: 2018-02-14 | Disposition: A | Discharge status: Home / Self Care | Payer: Medicare Other | Source: Ambulatory Visit | Attending: Internal Medicine | Admitting: Internal Medicine

## 2018-02-14 DIAGNOSIS — Z01810 Encounter for preprocedural cardiovascular examination: Secondary | ICD-10-CM | POA: Diagnosis not present

## 2018-02-14 HISTORY — DX: Other specified diseases of liver: K76.89

## 2018-02-14 HISTORY — DX: Essential (primary) hypertension: I10

## 2018-02-14 HISTORY — DX: Chronic obstructive pulmonary disease, unspecified: J44.9

## 2018-02-14 HISTORY — DX: Hemoptysis: R04.2

## 2018-02-14 HISTORY — DX: Other nonspecific abnormal finding of lung field: R91.8

## 2018-02-14 HISTORY — DX: Unspecified glaucoma: H40.9

## 2018-02-14 HISTORY — DX: Dyspnea, unspecified: R06.00

## 2018-02-14 NOTE — Patient Instructions (Signed)
Your procedure is scheduled on:  Tuesday, April 30TH  Report to [facility].  DO NOT STOP ON THE FIRST FLOOR TO REGISTER  To find out your arrival time please call [phone] between 1PM - 3PM                  on Monday, April 29TH  Remember: Instructions that are not followed completely may result in serious  medical risk, up to and including death, or upon the discretion of your surgeon  and anesthesiologist your surgery may need to be rescheduled.     _X__ 1. Do not eat food after midnight the night before your procedure.                 No gum chewing or hard candies.                   You may drink clear liquids up to 2 hours                 before you are scheduled to arrive for your surgery-                    DO not drink clear liquids within 2 hours of the start of your surgery.                 Clear Liquids include:  water, apple juice without pulp, clear carbohydrate                 drink such as Clearfast of Gatorade, Black Coffee or Tea (Do not add                 anything to coffee or tea).  __X__2.  On the morning of surgery brush your teeth with toothpaste and water,                        you may rinse your mouth with mouthwash if you wish.                               Do not swallow any toothpaste of mouthwash.     _X__ 3.  No Alcohol for 24 hours before or after surgery.   _X__ 4.  Do Not Smoke or use e-cigarettes For 24 Hours Prior to Your Surgery.                 Do not use any chewable tobacco products for at least 6 hours prior to                 surgery.  ____  5.  Bring all medications with you on the day of surgery if instructed.   ____  6.  Notify your doctor if there is any change in your medical condition      (cold, fever, infections).     Do not wear jewelry, make-up, hairpins, clips or nail polish. Do not wear lotions, powders, or perfumes. You may wear deodorant. Do not shave 48 hours prior to  surgery. Men may shave face and neck. Do not bring valuables to the hospital.    [facility] is not responsible for any belongings or valuables.  Contacts, dentures or bridgework may not be worn into surgery. Leave your suitcase in the car. After surgery it may be brought to your room. For patients  admitted to the hospital, discharge time is determined by your treatment team.   Patients discharged the day of surgery will not be allowed to drive home.   Please read over the following fact sheets that you were given:    PREPARING FOR SURGERY   ____ Take these medicines the morning of surgery with A SIP OF WATER:    1. ANORO  2. BRING ALBUTEROL WITH YOU TO [facility]  3. ZOCOR/SIMVASTATIN  4.  5.  6.  ____ Fleet Enema (as directed)   ____ Use CHG Soap as directed  _X__ Use inhalers on the day of surgery  _X___ Stop ALL ASPIRIN  PRODUCTS NOW!  ____ Stop Anti-inflammatories NOW!   ____ Stop supplements until after surgery.               YOU MAY CONTINUE YOUR VITAMIN D3 SUPPLEMENT BUT DO NOT TAKE ON THE MORNING OF SURGERY  ____ Bring C-Pap to the hospital.

## 2018-02-20 DIAGNOSIS — R918 Other nonspecific abnormal finding of lung field: Secondary | ICD-10-CM | POA: Diagnosis not present

## 2018-02-20 DIAGNOSIS — R634 Abnormal weight loss: Secondary | ICD-10-CM | POA: Diagnosis not present

## 2018-02-20 DIAGNOSIS — E7801 Familial hypercholesterolemia: Secondary | ICD-10-CM | POA: Diagnosis not present

## 2018-02-20 DIAGNOSIS — I10 Essential (primary) hypertension: Secondary | ICD-10-CM | POA: Diagnosis not present

## 2018-02-21 ENCOUNTER — Ambulatory Visit
Admission: RE | Admit: 2018-02-21 | Discharge: 2018-02-21 | Disposition: A | Discharge status: Home / Self Care | Payer: Medicare Other | Source: Ambulatory Visit | Attending: Internal Medicine | Admitting: Internal Medicine

## 2018-02-21 ENCOUNTER — Ambulatory Visit: Payer: Medicare Other | Admitting: Anesthesiology

## 2018-02-21 ENCOUNTER — Encounter
Admission: RE | Disposition: A | Discharge status: Home / Self Care | Payer: Self-pay | Source: Ambulatory Visit | Attending: Internal Medicine

## 2018-02-21 ENCOUNTER — Other Ambulatory Visit: Payer: Self-pay

## 2018-02-21 DIAGNOSIS — E785 Hyperlipidemia, unspecified: Secondary | ICD-10-CM | POA: Diagnosis not present

## 2018-02-21 DIAGNOSIS — C3401 Malignant neoplasm of right main bronchus: Secondary | ICD-10-CM | POA: Diagnosis not present

## 2018-02-21 DIAGNOSIS — R918 Other nonspecific abnormal finding of lung field: Secondary | ICD-10-CM | POA: Diagnosis not present

## 2018-02-21 DIAGNOSIS — I129 Hypertensive chronic kidney disease with stage 1 through stage 4 chronic kidney disease, or unspecified chronic kidney disease: Secondary | ICD-10-CM | POA: Diagnosis not present

## 2018-02-21 DIAGNOSIS — Z853 Personal history of malignant neoplasm of breast: Secondary | ICD-10-CM | POA: Diagnosis not present

## 2018-02-21 DIAGNOSIS — J449 Chronic obstructive pulmonary disease, unspecified: Secondary | ICD-10-CM | POA: Diagnosis not present

## 2018-02-21 DIAGNOSIS — Z7982 Long term (current) use of aspirin: Secondary | ICD-10-CM | POA: Insufficient documentation

## 2018-02-21 DIAGNOSIS — Z87891 Personal history of nicotine dependence: Secondary | ICD-10-CM | POA: Diagnosis not present

## 2018-02-21 DIAGNOSIS — C3412 Malignant neoplasm of upper lobe, left bronchus or lung: Secondary | ICD-10-CM | POA: Insufficient documentation

## 2018-02-21 DIAGNOSIS — N189 Chronic kidney disease, unspecified: Secondary | ICD-10-CM | POA: Diagnosis not present

## 2018-02-21 DIAGNOSIS — Z923 Personal history of irradiation: Secondary | ICD-10-CM | POA: Insufficient documentation

## 2018-02-21 DIAGNOSIS — C3411 Malignant neoplasm of upper lobe, right bronchus or lung: Secondary | ICD-10-CM | POA: Diagnosis not present

## 2018-02-21 DIAGNOSIS — C3491 Malignant neoplasm of unspecified part of right bronchus or lung: Secondary | ICD-10-CM | POA: Diagnosis not present

## 2018-02-21 DIAGNOSIS — Z79899 Other long term (current) drug therapy: Secondary | ICD-10-CM | POA: Insufficient documentation

## 2018-02-21 DIAGNOSIS — C3490 Malignant neoplasm of unspecified part of unspecified bronchus or lung: Secondary | ICD-10-CM | POA: Diagnosis not present

## 2018-02-21 HISTORY — PX: ENDOBRONCHIAL ULTRASOUND: SHX5096

## 2018-02-21 SURGERY — ENDOBRONCHIAL ULTRASOUND (EBUS)
Anesthesia: General

## 2018-02-21 MED ORDER — ONDANSETRON HCL 4 MG/2ML IJ SOLN: 4.0000 mg | Freq: Once | INTRAMUSCULAR | Status: DC | PRN

## 2018-02-21 MED ORDER — ONDANSETRON HCL 4 MG/2ML IJ SOLN
INTRAMUSCULAR | Status: AC
  Filled 2018-02-21: qty 2

## 2018-02-21 MED ORDER — PHENYLEPHRINE HCL 10 MG/ML IJ SOLN
INTRAMUSCULAR | Status: DC | PRN
  Administered 2018-02-21 (×2): 50 ug via INTRAVENOUS

## 2018-02-21 MED ORDER — PROPOFOL 10 MG/ML IV BOLUS
INTRAVENOUS | Status: AC
  Filled 2018-02-21: qty 20

## 2018-02-21 MED ORDER — LIDOCAINE HCL 2 % EX GEL
1.0000 "application " | Freq: Once | CUTANEOUS | Status: DC
  Filled 2018-02-21: qty 5

## 2018-02-21 MED ORDER — ESMOLOL HCL 100 MG/10ML IV SOLN
INTRAVENOUS | Status: DC | PRN
  Administered 2018-02-21: 5 mg via INTRAVENOUS
  Administered 2018-02-21: 10 mg via INTRAVENOUS

## 2018-02-21 MED ORDER — FENTANYL CITRATE (PF) 100 MCG/2ML IJ SOLN: 25.0000 ug | INTRAMUSCULAR | Status: DC | PRN

## 2018-02-21 MED ORDER — ONDANSETRON HCL 4 MG/2ML IJ SOLN
INTRAMUSCULAR | Status: DC | PRN
  Administered 2018-02-21: 4 mg via INTRAVENOUS

## 2018-02-21 MED ORDER — PHENYLEPHRINE HCL 0.25 % NA SOLN
1.0000 | Freq: Four times a day (QID) | NASAL | Status: DC | PRN
  Filled 2018-02-21: qty 15

## 2018-02-21 MED ORDER — SUGAMMADEX SODIUM 200 MG/2ML IV SOLN
INTRAVENOUS | Status: DC | PRN
  Administered 2018-02-21: 140 mg via INTRAVENOUS

## 2018-02-21 MED ORDER — SUCCINYLCHOLINE CHLORIDE 20 MG/ML IJ SOLN
INTRAMUSCULAR | Status: AC
  Filled 2018-02-21: qty 1

## 2018-02-21 MED ORDER — ESMOLOL HCL 100 MG/10ML IV SOLN
INTRAVENOUS | Status: AC
  Filled 2018-02-21: qty 10

## 2018-02-21 MED ORDER — FENTANYL CITRATE (PF) 100 MCG/2ML IJ SOLN
INTRAMUSCULAR | Status: DC | PRN
  Administered 2018-02-21 (×2): 25 ug via INTRAVENOUS
  Administered 2018-02-21: 50 ug via INTRAVENOUS

## 2018-02-21 MED ORDER — DEXAMETHASONE SODIUM PHOSPHATE 10 MG/ML IJ SOLN
INTRAMUSCULAR | Status: AC
  Filled 2018-02-21: qty 1

## 2018-02-21 MED ORDER — MIDAZOLAM HCL 2 MG/2ML IJ SOLN
INTRAMUSCULAR | Status: AC
Start: 2018-02-21 — End: 2018-02-21
  Filled 2018-02-21: qty 2

## 2018-02-21 MED ORDER — SUCCINYLCHOLINE CHLORIDE 20 MG/ML IJ SOLN
INTRAMUSCULAR | Status: DC | PRN
  Administered 2018-02-21: 100 mg via INTRAVENOUS

## 2018-02-21 MED ORDER — ROCURONIUM BROMIDE 50 MG/5ML IV SOLN
INTRAVENOUS | Status: AC
  Filled 2018-02-21: qty 1

## 2018-02-21 MED ORDER — LACTATED RINGERS IV SOLN
INTRAVENOUS | Status: DC
  Administered 2018-02-21: 12:00:00 via INTRAVENOUS

## 2018-02-21 MED ORDER — DEXAMETHASONE SODIUM PHOSPHATE 10 MG/ML IJ SOLN
INTRAMUSCULAR | Status: DC | PRN
  Administered 2018-02-21: 5 mg via INTRAVENOUS

## 2018-02-21 MED ORDER — FENTANYL CITRATE (PF) 100 MCG/2ML IJ SOLN
INTRAMUSCULAR | Status: AC
  Filled 2018-02-21: qty 2

## 2018-02-21 MED ORDER — LIDOCAINE HCL (CARDIAC) PF 100 MG/5ML IV SOSY
PREFILLED_SYRINGE | INTRAVENOUS | Status: DC | PRN
  Administered 2018-02-21: 100 mg via INTRAVENOUS

## 2018-02-21 MED ORDER — ROCURONIUM BROMIDE 100 MG/10ML IV SOLN
INTRAVENOUS | Status: DC | PRN
  Administered 2018-02-21: 5 mg via INTRAVENOUS
  Administered 2018-02-21: 15 mg via INTRAVENOUS

## 2018-02-21 MED ORDER — PROPOFOL 10 MG/ML IV BOLUS
INTRAVENOUS | Status: DC | PRN
  Administered 2018-02-21: 150 mg via INTRAVENOUS

## 2018-02-21 MED ORDER — BUTAMBEN-TETRACAINE-BENZOCAINE 2-2-14 % EX AERO
1.0000 | INHALATION_SPRAY | Freq: Once | CUTANEOUS | Status: DC
  Filled 2018-02-21: qty 20

## 2018-02-21 NOTE — Anesthesia Post-op Follow-up Note (Signed)
Anesthesia QCDR form completed.        

## 2018-02-21 NOTE — Anesthesia Procedure Notes (Signed)
Procedure Name: Intubation Date/Time: 02/21/2018 1:18 PM Performed by: Dionne Bucy, CRNA Pre-anesthesia Checklist: Patient identified, Patient being monitored, Timeout performed, Emergency Drugs available and Suction available Patient Re-evaluated:Patient Re-evaluated prior to induction Oxygen Delivery Method: Circle system utilized Preoxygenation: Pre-oxygenation with 100% oxygen Induction Type: IV induction Ventilation: Mask ventilation without difficulty Laryngoscope Size: Mac and 3 Grade View: Grade I Tube type: Oral Tube size: 8.0 mm Number of attempts: 1 Airway Equipment and Method: Stylet Placement Confirmation: ETT inserted through vocal cords under direct vision,  positive ETCO2 and breath sounds checked- equal and bilateral Secured at: 21 cm Tube secured with: Tape Dental Injury: Teeth and Oropharynx as per pre-operative assessment

## 2018-02-21 NOTE — Discharge Instructions (Signed)
Needle Biopsy of the Lung, Care After This sheet gives you information about how to care for yourself after your procedure. Your health care provider may also give you more specific instructions. If you have problems or questions, contact your health care provider. What can I expect after the procedure? After the procedure, it is common to have:  Soreness, pain, and tenderness where a tissue sample was taken (biopsy site).  A cough.  A sore throat.  Follow these instructions at home: Biopsy site care  Follow instructions from your health care provider about when to remove the bandage that was placed on the biopsy site.  Keep the bandage dry until it has been removed.  Check your biopsy site every day for signs of infection. Check for: ? More redness, swelling, or pain. ? More fluid or blood. ? Warmth to the touch. ? Pus or a bad smell. General instructions  Rest as directed by your health care provider. Ask your health care provider what activities are safe for you.  Do not take baths, swim, or use a hot tub until your health care provider approves.  Take over-the-counter and prescription medicines only as told by your health care provider.  If you have airplane travel scheduled, talk with your health care provider about when it is safe for you to travel by airplane.  It is up to you to get the results of your procedure. Ask your health care provider, or the department that is doing the procedure, when your results will be ready.  Keep all follow-up visits as told by your health care provider. This is important. Contact a health care provider if:  You have more redness, swelling, or pain around your biopsy site.  You have more fluid or blood coming from your biopsy site.  Your biopsy site feels warm to the touch.  You have pus or a bad smell coming from your biopsy site.  You have a fever.  You have pain that does not get better with medicine. Get help right away  if:  You have problems breathing.  You have chest pain.  You cough up blood.  You faint.  You have a fast heart rate. Summary  After a needle biopsy of the lung, it is common to have a cough, a sore throat, or soreness, pain, and tenderness where a tissue sample was taken (biopsy site).  You should check your biopsy area every day for signs of infection, including pus or a bad smell, warmth, more fluid or blood, or more redness, swelling, or pain.  You should not take baths, swim, or use a hot tub until your health care provider approves.  It is up to you to get the results of your procedure. Ask your health care provider, or the department that is doing the procedure, when your results will be ready. This information is not intended to replace advice given to you by your health care provider. Make sure you discuss any questions you have with your health care provider. Document Released: 08/08/2007 Document Revised: 09/01/2016 Document Reviewed: 09/01/2016 Elsevier Interactive Patient Education  2017 [place]   1) The drugs that you were given will stay in your system until tomorrow so for the next 24 hours you should not:  A) Drive an automobile B) Make any legal decisions C) Drink any alcoholic beverage   2) You may resume regular meals tomorrow.  Today it is better to start with liquids and  gradually work up to solid foods.  You may eat anything you prefer, but it is better to start with liquids, then soup and crackers, and gradually work up to solid foods.   3) Please notify your doctor immediately if you have any unusual bleeding, trouble breathing, redness and pain at the surgery site, drainage, fever, or pain not relieved by medication.    4) Additional Instructions:        Please contact your physician with any problems or Same Day Surgery at [phone], Monday through Friday 6 am to 4 pm, or [place]  at [facility] number at [phone].

## 2018-02-21 NOTE — Op Note (Signed)
[facility] Patient Name: Brenda Parker Procedure Date: 02/21/2018 1:26 PM MRN: [id number] Account #: [id number] Date of Birth: [birth year] Admit Type: Outpatient Age: 82 Room: [facility] PROCEDURE RM 01 Gender: Female Note Status: Finalized Attending MD: Laverle Hobby MD, MD Procedure:         Bronchoscopy Indications:       Right upper lobe mass Providers:         Laverle Hobby MD, MD Referring MD:       Medicines:         See the Anesthesia note for documentation of the                     administered medications Complications:     No immediate complications Procedure:         Pre-Anesthesia Assessment:                    - A History and Physical has been performed. Patient meds                     and allergies have been reviewed. The risks and benefits                     of the procedure and the sedation options and risks were                     discussed with the patient. All questions were answered                     and informed consent was obtained. Patient identification                     and proposed procedure were verified prior to the                     procedure. Mental Status Examination: normal. Airway                     Examination: normal oropharyngeal airway. Respiratory                     Examination: clear to auscultation. CV Examination:                     normal. ASA Grade Assessment: III - A patient with severe                     systemic disease. After reviewing the risks and benefits,                     the patient was deemed in satisfactory condition to                     undergo the procedure. The anesthesia plan was to use                     general anesthesia. Immediately prior to administration of                     medications, the patient was re-assessed for adequacy to                     receive sedatives. The heart rate, respiratory rate,  oxygen saturations, blood pressure, adequacy of  pulmonary                     ventilation, and response to care were monitored                     throughout the procedure. The physical status of the                     patient was re-assessed after the procedure.                    After obtaining informed consent, the bronchoscope was                     passed under direct vision. Throughout the procedure, the                     patient's blood pressure, pulse, and oxygen saturations                     were monitored continuously. the Bronchoscope was                     introduced through the mouth, via the endotracheal tube                     (the patient was intubated for the procedure) and advanced                     to the tracheobronchial tree. The procedure was                     accomplished without difficulty. The patient tolerated the                     procedure fairly well. Findings:      The nasopharynx/oropharynx appears normal. The larynx appears normal.       The vocal cords appear normal. The subglottic space is normal. The       trachea is of normal caliber. The carina is sharp. The tracheobronchial       tree of the left lung was examined to at least the first subsegmental       level. Bronchial mucosa and anatomy in the left lung are normal; there       are no endobronchial lesions, and no secretions.      Trachea/Carina Abnormalities: A small completely obstructing fungating       and infiltrative lesion was found.      Right Lung Abnormalities: A medium sized partially obstructing       fungating, infiltrative and polypoid lesion was found in the middle       portion in the right mainstem bronchus, in the bronchus intermedius and       in the right upper lobe. A large nearly obstructing (greater than 90%       obstructed) fungating lesion was found proximally, at the orifice.      Transbronchial needle aspirations of a lesion were performed in the       right paratracheal area. Two samples were  obtained.      Transbronchial biopsies of a lesion were performed in the right upper       lobe using a needle. Transbronchial biopsy technique was selected       because the sampling site  was not visible endoscopically. Three biopsy       passes were performed. Three biopsy samples were obtained.      Brushings of a lesion were obtained with a cytology brush. One sample       was obtained.      Bronchoalveolar lavage was performed in the lung and sent for cell       count, bacterial culture, viral smears & culture, and fungal & AFB       analysis and cytology. The return was bloody. Mucous plugs were present       in the return fluid.      Endobronchial biopsies of an area of infiltration were performed at the       carina using a forceps. Three samples were obtained. Impression:        - The airway examination of the left lung was normal.                    - A lesion was found.                    - A lesion was found in the right mainstem bronchus, in                     the bronchus intermedius and in the right upper lobe.                    - A lesion was found.                    - A transbronchial needle aspiration was performed.                    - Transbronchial lung biopsies were performed.                    - Brushings were obtained.                    - Bronchoalveolar lavage was performed.                    - An endobronchial biopsy was performed.                    - A blood clot was found in the bronchus intermedius. Recommendation:    - Await test results. Dorrell Mitcheltree Verne Spurr MD, MD 02/21/2018 2:24:40 PM This report has been signed electronically. Number of Addenda: 0 Note Initiated On: 02/21/2018 1:26 PM      [facility]

## 2018-02-21 NOTE — Transfer of Care (Signed)
Immediate Anesthesia Transfer of Care Note  Patient: Brenda Parker  Procedure(s) Performed: ENDOBRONCHIAL ULTRASOUND (N/A )  Patient Location: PACU  Anesthesia Type:General  Level of Consciousness: sedated  Airway & Oxygen Therapy: Patient Spontanous Breathing and Patient connected to face mask oxygen  Post-op Assessment: Report given to RN and Post -op Vital signs reviewed and stable  Post vital signs: Reviewed and stable  Last Vitals:  Vitals Value Taken Time  BP 133/61 02/21/2018  2:21 PM  Temp    Pulse 107 02/21/2018  2:22 PM  Resp 20 02/21/2018  2:22 PM  SpO2 100 % 02/21/2018  2:22 PM  Vitals shown include unvalidated device data.  Last Pain:  Vitals:   02/21/18 1144  TempSrc: Temporal         Complications: No apparent anesthesia complications

## 2018-02-21 NOTE — Interval H&P Note (Signed)
History and Physical Interval Note:  02/21/2018 12:59 PM  Brenda Parker  has presented today for surgery, with the diagnosis of LUNG MASS  The various methods of treatment have been discussed with the patient and family. After consideration of risks, benefits and other options for treatment, the patient has consented to  Procedure(s): ENDOBRONCHIAL ULTRASOUND (N/A) as a surgical intervention .  The patient's history has been reviewed, patient examined, no change in status, stable for surgery.  I have reviewed the patient's chart and labs.  Questions were answered to the patient's satisfaction.     Laverle Hobby

## 2018-02-21 NOTE — Anesthesia Preprocedure Evaluation (Addendum)
Anesthesia Evaluation  Patient identified by MRN, date of birth, ID band Patient awake    Reviewed: Allergy & Precautions, NPO status , Patient's Chart, lab work & pertinent test results  History of Anesthesia Complications Negative for: history of anesthetic complications  Airway Mallampati: II       Dental  (+) Upper Dentures, Lower Dentures   Pulmonary neg sleep apnea, COPD,  COPD inhaler, former smoker,           Cardiovascular hypertension, Pt. on medications (-) Past MI and (-) CHF (-) dysrhythmias (-) Valvular Problems/Murmurs     Neuro/Psych neg Seizures    GI/Hepatic Neg liver ROS, neg GERD  ,  Endo/Other  neg diabetes  Renal/GU Renal InsufficiencyRenal disease     Musculoskeletal   Abdominal   Peds  Hematology   Anesthesia Other Findings   Reproductive/Obstetrics                            Anesthesia Physical Anesthesia Plan  ASA: III  Anesthesia Plan:    Post-op Pain Management:    Induction: Intravenous  PONV Risk Score and Plan: 2  Airway Management Planned: Oral ETT  Additional Equipment:   Intra-op Plan:   Post-operative Plan:   Informed Consent: I have reviewed the patients History and Physical, chart, labs and discussed the procedure including the risks, benefits and alternatives for the proposed anesthesia with the patient or authorized representative who has indicated his/her understanding and acceptance.     Plan Discussed with:   Anesthesia Plan Comments:         Anesthesia Quick Evaluation

## 2018-02-21 NOTE — Anesthesia Postprocedure Evaluation (Signed)
Anesthesia Post Note  Patient: Brenda Parker  Procedure(s) Performed: ENDOBRONCHIAL ULTRASOUND (N/A )  Patient location during evaluation: PACU Anesthesia Type: General Level of consciousness: awake and alert Pain management: pain level controlled Vital Signs Assessment: post-procedure vital signs reviewed and stable Respiratory status: spontaneous breathing and respiratory function stable Cardiovascular status: stable Anesthetic complications: no     Last Vitals:  Vitals:   02/21/18 1144 02/21/18 1421  BP: (!) 144/88 133/61  Pulse: 97 (!) 107  Resp: 16 20  Temp: (!) 36.1 C (!) 36.2 C  SpO2: 98% 100%    Last Pain:  Vitals:   02/21/18 1421  TempSrc:   PainSc: 0-No pain                 KEPHART,WILLIAM K

## 2018-02-22 LAB — ACID FAST SMEAR (AFB, MYCOBACTERIA): Acid Fast Smear: NEGATIVE

## 2018-02-23 ENCOUNTER — Other Ambulatory Visit: Payer: Self-pay | Admitting: Anatomic Pathology & Clinical Pathology

## 2018-02-23 LAB — CYTOLOGY - NON PAP

## 2018-02-24 LAB — SURGICAL PATHOLOGY

## 2018-02-25 LAB — CULTURE, BAL-QUANTITATIVE W GRAM STAIN
Culture: 3000 — AB
Special Requests: NORMAL

## 2018-02-25 LAB — CULTURE, BAL-QUANTITATIVE

## 2018-02-27 NOTE — Progress Notes (Signed)
[facility] Consultation      Assessment and Plan:  Non-small cell lung cancer. -Status post bronchoscopy on 02/21/2018, consistent with non-small cell lung cancer, favor squamous. - Positive samples in both bronchus intermedius, as well as the main carina, and lymph nodes. --Pt will follow up with Dr. Raul Del afterwards.  Patient will be referred to cancer center.  Staphylococcal bronchitis. - Bronchoscopy shows staphylococcal species (non-aureus) with methicillin resistance. - We will give course of doxycycline.  COPD with dyspnea on exertion. - Stable, continue current treatment.  Meds ordered this encounter  Medications  . doxycycline (VIBRAMYCIN) 100 MG capsule    Sig: Take 1 capsule (100 mg total) by mouth 2 (two) times daily.    Dispense:  14 capsule    Refill:  0   Orders Placed This Encounter  Procedures  . Ambulatory referral to Oncology     Date: 02/27/2018  MRN# [id number] Brenda Parker [birth year]    Brenda Parker is a 82 y.o. old female seen in consultation for chief complaint of:    Chief Complaint  Patient presents with  . Follow-up    post bronchoscopy  . Cough  . Wheezing    HPI:  She has a history of breast cancer, several years ago on the right breast and received radiation, it was found in June of 2005.  She underwent a EBUS bronchoscopy 02/21/2018, with narrowing of the right mainstem bronchus, and right upper lobe mass.  Biopsies were positive for non-small cell lung cancer, culture showed methicillin consistent staph species, sensitive to Bactrim. Procedure was well-tolerated, she has no residual symptoms.  She feels her breathing is doing well.  She has concerns about potentially going on chemotherapy, because her husband was on chemotherapy, she is worried about losing her hair and not being able to live independently anymore. 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 She lives by herself and does her own ADL's.  She smoked  until 2007, was smoking about a ppd.    Additional surgical history:  Colonoscopy in 2012. Left hip replaced in 2008,  Cholecystectomy in 1988.  Tonsillectomy 1960.   Imaging personally reviewed, CT chest 01/16/18, there is right upper lobe scattered infiltrate, with volume loss and partial atelectasis.  There is a cut off at the right upper lobe bronchus, and narrowing of the bronchus intermedius, suggestive of endobronchial lesions.  There is a right hilar mass, possible subcarinal lymphadenopathy, there is a mass in the liver, described by the radiologist as benign likely liver cyst.  PET scan 01/24/18, right suprahilar mass with SUV of 20, satellite lesions in the right upper lobe which were hypermetabolic and right lower lobe.  No extra pulmonary hypermetabolic findings. MRI 07/08/17; imaging in the liver consistent with liver cysts.   Social Hx:   Social History   Tobacco Use  . Smoking status: Former Smoker    Packs/day: 1.00    Types: Cigarettes    Last attempt to quit: 10/25/2005    Years since quitting: 12.3  . Smokeless tobacco: Never Used  Substance Use Topics  . Alcohol use: Not Currently    Comment: occasional glass of beer  . Drug use: Never   Medication:    Current Outpatient Medications:  .  albuterol (PROVENTIL HFA) 108 (90 Base) MCG/ACT inhaler, Inhale 2 puffs into the lungs every 6 (six) hours as needed for wheezing or shortness of breath. , Disp: , Rfl:  .  aspirin EC 325 MG  tablet, Take 325 mg by mouth daily as needed for moderate pain., Disp: , Rfl:  .  aspirin EC 81 MG tablet, Take 81 mg by mouth daily., Disp: , Rfl:  .  Cholecalciferol (VITAMIN D3) 3000 units TABS, Take 3,000 Units by mouth daily. , Disp: , Rfl:  .  latanoprost (XALATAN) 0.005 % ophthalmic solution, Place 1 drop into both eyes at bedtime., Disp: , Rfl:  .  simvastatin (ZOCOR) 40 MG tablet, Take 40 mg by mouth daily. , Disp: , Rfl:  .  umeclidinium-vilanterol (ANORO ELLIPTA) 62.5-25 MCG/INH  AEPB, Inhale 1 puff into the lungs daily., Disp: 60 each, Rfl: 0   Allergies:  Penicillins  Review of Systems: Gen:  Denies  fever, sweats, chills HEENT: Denies blurred vision, double vision. bleeds, sore throat Cvc:  No dizziness, chest pain. Other:  All other systems were reviewed with the patient and were negative other that what is mentioned in the HPI.   Physical Examination:   VS: BP 130/70 (BP Location: Left Arm, Cuff Size: Normal)   Pulse 83   Resp 16   Ht 5\' 4"  (1.626 m)   Wt 147 lb (66.7 kg)   SpO2 98%   BMI 25.23 kg/m   General Appearance: No distress  Neuro:without focal findings,  speech normal,  HEENT: PERRLA, EOM intact.   Pulmonary: Decreased air entry in the right lung., No wheezing.  CardiovascularNormal S1,S2.  No m/r/g.   Abdomen: Benign, Soft, non-tender. Renal:  No costovertebral tenderness  GU:  No performed at this time. Endoc: No evident thyromegaly, no signs of acromegaly. Skin:   warm, no rashes, no ecchymosis  Extremities: normal, no cyanosis, clubbing.  Other findings:    LABORATORY PANEL:   CBC No results for input(s): WBC, HGB, HCT, PLT in the last 168 hours. ------------------------------------------------------------------------------------------------------------------  Chemistries  No results for input(s): NA, K, CL, CO2, GLUCOSE, BUN, CREATININE, CALCIUM, MG, AST, ALT, ALKPHOS, BILITOT in the last 168 hours.  Invalid input(s): GFRCGP ------------------------------------------------------------------------------------------------------------------  Cardiac Enzymes No results for input(s): TROPONINI in the last 168 hours. ------------------------------------------------------------  RADIOLOGY:  No results found.     Thank  you for the consultation and for allowing [facility] to assist in the care of your patient. Our recommendations are noted above.  Please contact us if we can be of further  service.   Marda Stalker, MD.  Board Certified in Internal Medicine, Pulmonary Medicine, [place], and Sleep Medicine.  [facility] Office Number: [phone]  Patricia Pesa, M.D.  Merton Border, M.D  02/27/2018

## 2018-02-28 ENCOUNTER — Encounter: Payer: Self-pay | Admitting: Internal Medicine

## 2018-02-28 ENCOUNTER — Ambulatory Visit (INDEPENDENT_AMBULATORY_CARE_PROVIDER_SITE_OTHER): Payer: Medicare Other | Admitting: Internal Medicine

## 2018-02-28 VITALS — BP 130/70 | HR 83 | Resp 16 | Ht 64.0 in | Wt 147.0 lb

## 2018-02-28 DIAGNOSIS — R918 Other nonspecific abnormal finding of lung field: Secondary | ICD-10-CM

## 2018-02-28 DIAGNOSIS — J4489 Other specified chronic obstructive pulmonary disease: Secondary | ICD-10-CM

## 2018-02-28 DIAGNOSIS — C3490 Malignant neoplasm of unspecified part of unspecified bronchus or lung: Secondary | ICD-10-CM | POA: Diagnosis not present

## 2018-02-28 DIAGNOSIS — J449 Chronic obstructive pulmonary disease, unspecified: Secondary | ICD-10-CM

## 2018-02-28 MED ORDER — DOXYCYCLINE HYCLATE 100 MG PO CAPS: 100.0000 mg | ORAL_CAPSULE | Freq: Two times a day (BID) | ORAL | 0 refills | Status: DC

## 2018-02-28 NOTE — Patient Instructions (Addendum)
Will refer you to oncology (cancer doctor) asap to discuss further testing and treatment.  You have non-small cell lung cancer.  There are 2 general types, adenocarcinoma and squamous carcinoma, yours is most likely squamous type. Will give you a course of antibiotic to treat a mild infection that was found on the bronchoscopy.   Continue to follow up with Dr. Raul Del.

## 2018-03-01 ENCOUNTER — Other Ambulatory Visit: Payer: Self-pay | Admitting: *Deleted

## 2018-03-01 DIAGNOSIS — C3490 Malignant neoplasm of unspecified part of unspecified bronchus or lung: Secondary | ICD-10-CM

## 2018-03-03 ENCOUNTER — Inpatient Hospital Stay: Payer: Medicare Other | Attending: Oncology | Admitting: Internal Medicine

## 2018-03-03 ENCOUNTER — Telehealth: Payer: Self-pay | Admitting: Internal Medicine

## 2018-03-03 ENCOUNTER — Inpatient Hospital Stay: Payer: Medicare Other

## 2018-03-03 ENCOUNTER — Encounter: Payer: Self-pay | Admitting: Internal Medicine

## 2018-03-03 DIAGNOSIS — Z853 Personal history of malignant neoplasm of breast: Secondary | ICD-10-CM

## 2018-03-03 DIAGNOSIS — Z5111 Encounter for antineoplastic chemotherapy: Secondary | ICD-10-CM | POA: Insufficient documentation

## 2018-03-03 DIAGNOSIS — R51 Headache: Secondary | ICD-10-CM | POA: Insufficient documentation

## 2018-03-03 DIAGNOSIS — R634 Abnormal weight loss: Secondary | ICD-10-CM | POA: Insufficient documentation

## 2018-03-03 DIAGNOSIS — R5383 Other fatigue: Secondary | ICD-10-CM | POA: Diagnosis not present

## 2018-03-03 DIAGNOSIS — C3411 Malignant neoplasm of upper lobe, right bronchus or lung: Secondary | ICD-10-CM | POA: Diagnosis not present

## 2018-03-03 DIAGNOSIS — R630 Anorexia: Secondary | ICD-10-CM | POA: Insufficient documentation

## 2018-03-03 DIAGNOSIS — J90 Pleural effusion, not elsewhere classified: Secondary | ICD-10-CM | POA: Insufficient documentation

## 2018-03-03 DIAGNOSIS — Z87891 Personal history of nicotine dependence: Secondary | ICD-10-CM | POA: Diagnosis not present

## 2018-03-03 DIAGNOSIS — J449 Chronic obstructive pulmonary disease, unspecified: Secondary | ICD-10-CM | POA: Diagnosis not present

## 2018-03-03 LAB — COMPREHENSIVE METABOLIC PANEL
ALBUMIN: 3.8 g/dL (ref 3.5–5.0)
ALK PHOS: 92 U/L (ref 38–126)
ALT: 14 U/L (ref 14–54)
ANION GAP: 10 (ref 5–15)
AST: 18 U/L (ref 15–41)
BUN: 22 mg/dL — AB (ref 6–20)
CALCIUM: 9.9 mg/dL (ref 8.9–10.3)
CO2: 22 mmol/L (ref 22–32)
Chloride: 107 mmol/L (ref 101–111)
Creatinine, Ser: 1.28 mg/dL — ABNORMAL HIGH (ref 0.44–1.00)
GFR calc Af Amer: 45 mL/min — ABNORMAL LOW (ref >60)
GFR calc non Af Amer: 39 mL/min — ABNORMAL LOW (ref >60)
GLUCOSE: 110 mg/dL — AB (ref 65–99)
Potassium: 4.7 mmol/L (ref 3.5–5.1)
SODIUM: 139 mmol/L (ref 135–145)
Total Bilirubin: 0.7 mg/dL (ref 0.3–1.2)
Total Protein: 8 g/dL (ref 6.5–8.1)

## 2018-03-03 LAB — CBC WITH DIFFERENTIAL/PLATELET
BASOS PCT: 1 %
Basophils Absolute: 0.1 10*3/uL (ref 0–0.1)
EOS PCT: 2 %
Eosinophils Absolute: 0.1 10*3/uL (ref 0–0.7)
HEMATOCRIT: 33.6 % — AB (ref 35.0–47.0)
Hemoglobin: 11.1 g/dL — ABNORMAL LOW (ref 12.0–16.0)
Lymphocytes Relative: 12 %
Lymphs Abs: 1 10*3/uL (ref 1.0–3.6)
MCH: 28.5 pg (ref 26.0–34.0)
MCHC: 33 g/dL (ref 32.0–36.0)
MCV: 86.5 fL (ref 80.0–100.0)
MONOS PCT: 5 %
Monocytes Absolute: 0.5 10*3/uL (ref 0.2–0.9)
NEUTROS ABS: 7.2 10*3/uL — AB (ref 1.4–6.5)
Neutrophils Relative %: 80 %
Platelets: 501 10*3/uL — ABNORMAL HIGH (ref 150–440)
RBC: 3.88 MIL/uL (ref 3.80–5.20)
RDW: 16.5 % — AB (ref 11.5–14.5)
WBC: 9 10*3/uL (ref 3.6–11.0)

## 2018-03-03 NOTE — Assessment & Plan Note (Addendum)
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  Small pleural effusions right side]  #Long discussion the patient and family regarding overall serious diagnosis/guarded prognosis.  Discussed the staging/pathology in detail.  #I discussed the possible treatment options-chemoradiation [carbotaxol weekly-with concurrent radiation] vs. systemic chemotherapy alone carbotaxol with pembrolizumab.   #I discussed the pros and cons of each option -I will discuss with Dr. Donella Stade radiation oncology on the 13th when available; regarding the feasibility of radiation given her prior radiation to the right breast.   #Also discussed the schedule/potential side effects including but not limited to to-increasing fatigue, nausea vomiting, diarrhea, hair loss, sores in the mouth, increase risk of infection and also neuropathy.  #Discussed that in general stage III lung cancers are cured 10 to 20% times; median survival is about 18 to 24 months; whereas stage IV lung cancer incurable-life expectancy is anywhere between 12 to 18 months.   #Await imaging of the brain early next week.  #COPD/stable  # will order omniseq.   #Discussed regarding port placement; chemotherapy education.   #Follow-up with me in approximately 10 days/CBC CMP/chemotherapy carbotaxol.   # I reviewed the blood work- with the patient in detail; also reviewed the imaging independently [as summarized above]; and with the patient in detail.   # 60 minutes face-to-face with the patient discussing the above plan of care; more than 50% of time spent on prognosis/ natural history; counseling and coordination.

## 2018-03-03 NOTE — Progress Notes (Signed)
[facility] NOTE  Patient Care Team: Leonel Ramsay, MD as PCP - General (Infectious Diseases) Telford Nab, RN as Registered Nurse  CHIEF COMPLAINTS/PURPOSE OF CONSULTATION:  Lung cancer  #  Oncology History   # RUL LUNG CANCER-non-small cell; favor squamous cell; PET scan T4N0 [right upper lobe mass involving[mediastinal/blood results] vs small pleural effusion [M1]    # Molecular testing-Omniseq  # Hx of Right breast ca [s/p lumpec; RT; No chemo; "pill" x5 years]  # COPD/Hx of smoking     Primary cancer of right upper lobe of lung ([place])     HISTORY OF PRESENTING ILLNESS:  Brenda Parker 82 y.o.  female long-standing history of smoking quit in 2017; also prior history of breast cancer 2005; has been referred to [place] for further evaluation recommendations for newly diagnosed lung cancer.  Patient states to have worsening shortness of breath and cough for the last 1 month.  This led to a CT scan-that showed left upper lobe mass-for which she had a subsequent bronchoscopy biopsy.   Patient also complains of about 40 pounds weight loss in the last 3 months.  She complains of poor appetite.  She denies any pain.  Patient denies any new lumps or bumps.  She complains of headaches especially in the mornings.  Intermittent dizzy spells.  No falls.  Review of Systems  Constitutional: Positive for malaise/fatigue and weight loss. Negative for chills, diaphoresis and fever.  HENT: Negative for nosebleeds and sore throat.   Eyes: Negative for double vision.  Respiratory: Positive for cough, hemoptysis, sputum production and shortness of breath. Negative for wheezing.   Cardiovascular: Negative for chest pain, palpitations, orthopnea and leg swelling.  Gastrointestinal: Negative for abdominal pain, blood in stool, constipation, diarrhea, heartburn, melena, nausea and vomiting.  Genitourinary: Negative for dysuria, frequency and urgency.  Musculoskeletal:  Negative for back pain and joint pain.  Skin: Negative.  Negative for itching and rash.  Neurological: Positive for dizziness and headaches. Negative for tingling, focal weakness and weakness.  Endo/Heme/Allergies: Does not bruise/bleed easily.  Psychiatric/Behavioral: Negative for depression. The patient is not nervous/anxious and does not have insomnia.      Breast ncacer-[June 2014-10-06 years; right no chmeo; s/p RT; Dr.Crystal; s/p lumpec;s/p Pills x5 years ]  MEDICAL HISTORY:  Past Medical History:  Diagnosis Date  . Benign liver cyst   . Breast cancer ([place]) 2005   right breast  . COPD (chronic obstructive pulmonary disease) ([place])   . Dyspnea   . Glaucoma   . Hemoptysis 2019  . Hypertension    Not on medication for htn. patient denies this  . Lung mass 01/2018  . Personal history of radiation therapy     SURGICAL HISTORY: Past Surgical History:  Procedure Laterality Date  . BREAST EXCISIONAL BIOPSY Right 2005   positive, radiation  . CHOLECYSTECTOMY  1988  . COLONOSCOPY  2012  . ENDOBRONCHIAL ULTRASOUND N/A 02/21/2018   Procedure: ENDOBRONCHIAL ULTRASOUND;  Surgeon: Laverle Hobby, MD;  Location: [facility] ORS;  Service: Pulmonary;  Laterality: N/A;  . EYE SURGERY Bilateral 2009,2010   cataract extractions with lens implant  . GALLBLADDER SURGERY Bilateral   . JOINT REPLACEMENT Left 2008   left hip replacement  . TONSILLECTOMY  1960    SOCIAL HISTORY: [place]; self; Network engineer; hx of smoking; occsioanal alcohol Social History   Socioeconomic History  . Marital status: Widowed    Spouse name: Not on file  . Number of children: Not on file  .  Years of education: Not on file  . Highest education level: Not on file  Occupational History  . Not on file  Social Needs  . Financial resource strain: Not on file  . Food insecurity:    Worry: Not on file    Inability: Not on file  . Transportation needs:    Medical: Not on file    Non-medical: Not on file   Tobacco Use  . Smoking status: Former Smoker    Packs/day: 1.00    Types: Cigarettes    Last attempt to quit: 10/25/2005    Years since quitting: 12.3  . Smokeless tobacco: Never Used  Substance and Sexual Activity  . Alcohol use: Not Currently    Comment: occasional glass of beer  . Drug use: Never  . Sexual activity: Not Currently  Lifestyle  . Physical activity:    Days per week: Not on file    Minutes per session: Not on file  . Stress: Not on file  Relationships  . Social connections:    Talks on phone: Not on file    Gets together: Not on file    Attends religious service: Not on file    Active member of club or organization: Not on file    Attends meetings of clubs or organizations: Not on file    Relationship status: Not on file  . Intimate partner violence:    Fear of current or ex partner: Not on file    Emotionally abused: Not on file    Physically abused: Not on file    Forced sexual activity: Not on file  Other Topics Concern  . Not on file  Social History Narrative  . Not on file    FAMILY HISTORY: Family History  Problem Relation Age of Onset  . Breast cancer Mother 66    ALLERGIES:  is allergic to penicillins.  MEDICATIONS:  Current Outpatient Medications  Medication Sig Dispense Refill  . albuterol (PROVENTIL HFA) 108 (90 Base) MCG/ACT inhaler Inhale 2 puffs into the lungs every 6 (six) hours as needed for wheezing or shortness of breath.     Marland Kitchen aspirin EC 81 MG tablet Take 81 mg by mouth daily.    . Cholecalciferol (VITAMIN D3) 3000 units TABS Take 3,000 Units by mouth daily.     Marland Kitchen latanoprost (XALATAN) 0.005 % ophthalmic solution Place 1 drop into both eyes at bedtime.    Marland Kitchen doxycycline (VIBRAMYCIN) 100 MG capsule Take 1 capsule (100 mg total) by mouth 2 (two) times daily. (Patient not taking: Reported on 03/03/2018) 14 capsule 0   No current facility-administered medications for this visit.       Marland Kitchen  PHYSICAL EXAMINATION: ECOG PERFORMANCE  STATUS: 1 - Symptomatic but completely ambulatory  Vitals:   03/03/18 0934  BP: 132/76  Pulse: 80  Resp: 18  Temp: (!) 97 F (36.1 C)  SpO2: 97%   Filed Weights   03/03/18 0934  Weight: 147 lb 3.2 oz (66.8 kg)    Physical Exam  GENERAL: Well-nourished well-developed; Alert, no distress and comfortable.  Accompanied by family.  EYES: no pallor or icterus OROPHARYNX: no thrush or ulceration; NECK: supple; no lymph nodes felt. LYMPH:  no palpable lymphadenopathy in the axillary or inguinal regions LUNGS: Decreased breath sounds auscultation bilaterally. No wheeze or crackles HEART/CVS: regular rate & rhythm and no murmurs; No lower extremity edema ABDOMEN:abdomen soft, non-tender and normal bowel sounds. No hepatomegaly or splenomegaly.  Musculoskeletal:no cyanosis of digits and no clubbing  PSYCH: alert & oriented x 3 with fluent speech NEURO: no focal motor/sensory deficits SKIN:  no rashes or significant lesions  LABORATORY DATA:  I have reviewed the data as listed Lab Results  Component Value Date   WBC 9.0 03/03/2018   HGB 11.1 (L) 03/03/2018   HCT 33.6 (L) 03/03/2018   MCV 86.5 03/03/2018   PLT 501 (H) 03/03/2018   Recent Labs    03/03/18 1132  NA 139  K 4.7  CL 107  CO2 22  GLUCOSE 110*  BUN 22*  CREATININE 1.28*  CALCIUM 9.9  GFRNONAA 39*  GFRAA 45*  PROT 8.0  ALBUMIN 3.8  AST 18  ALT 14  ALKPHOS 92  BILITOT 0.7    RADIOGRAPHIC STUDIES: I have personally reviewed the radiological images as listed and agreed with the findings in the report. No results found.  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  Small pleural effusions right side]  #Long discussion the patient and family regarding overall serious diagnosis/guarded prognosis.  Discussed the staging/pathology in detail.  #I discussed the possible treatment options-chemoradiation  [carbotaxol weekly-with concurrent radiation] vs. systemic chemotherapy alone carbotaxol with pembrolizumab.   #I discussed the pros and cons of each option -I will discuss with Dr. Donella Stade radiation oncology on the 13th when available; regarding the feasibility of radiation given her prior radiation to the right breast.   #Also discussed the schedule/potential side effects including but not limited to to-increasing fatigue, nausea vomiting, diarrhea, hair loss, sores in the mouth, increase risk of infection and also neuropathy.  #Discussed that in general stage III lung cancers are cured 10 to 20% times; median survival is about 18 to 24 months; whereas stage IV lung cancer incurable-life expectancy is anywhere between 12 to 18 months.   #Await imaging of the brain early next week.  #COPD/stable  # will order omniseq.   #Discussed regarding port placement; chemotherapy education.   #Follow-up with me in approximately 10 days/CBC CMP/chemotherapy carbotaxol.   # I reviewed the blood work- with the patient in detail; also reviewed the imaging independently [as summarized above]; and with the patient in detail.   # 60 minutes face-to-face with the patient discussing the above plan of care; more than 50% of time spent on prognosis/ natural history; counseling and coordination.   All questions were answered. The patient knows to call the clinic with any problems, questions or concerns.       [name], MD 03/03/2018 8:27 PM

## 2018-03-03 NOTE — Telephone Encounter (Signed)
Brenda Parker/Brenda Parker- Please order omniseq.

## 2018-03-04 LAB — CEA: CEA: 4.9 ng/mL — ABNORMAL HIGH (ref 0.0–4.7)

## 2018-03-06 ENCOUNTER — Other Ambulatory Visit: Payer: Self-pay

## 2018-03-06 ENCOUNTER — Ambulatory Visit
Admission: RE | Admit: 2018-03-06 | Discharge: 2018-03-06 | Disposition: A | Discharge status: Home / Self Care | Payer: Medicare Other | Source: Ambulatory Visit | Attending: Oncology | Admitting: Oncology

## 2018-03-06 ENCOUNTER — Other Ambulatory Visit: Payer: Self-pay | Admitting: *Deleted

## 2018-03-06 ENCOUNTER — Inpatient Hospital Stay: Payer: Medicare Other

## 2018-03-06 ENCOUNTER — Telehealth: Payer: Self-pay | Admitting: *Deleted

## 2018-03-06 ENCOUNTER — Ambulatory Visit
Admission: RE | Admit: 2018-03-06 | Discharge: 2018-03-06 | Disposition: A | Discharge status: Home / Self Care | Payer: Medicare Other | Source: Ambulatory Visit | Attending: Radiation Oncology | Admitting: Radiation Oncology

## 2018-03-06 ENCOUNTER — Encounter: Payer: Self-pay | Admitting: Radiation Oncology

## 2018-03-06 VITALS — BP 141/77 | HR 86 | Temp 95.7°F | Resp 18 | Wt 145.8 lb

## 2018-03-06 DIAGNOSIS — I6789 Other cerebrovascular disease: Secondary | ICD-10-CM | POA: Diagnosis not present

## 2018-03-06 DIAGNOSIS — Z923 Personal history of irradiation: Secondary | ICD-10-CM | POA: Insufficient documentation

## 2018-03-06 DIAGNOSIS — Z803 Family history of malignant neoplasm of breast: Secondary | ICD-10-CM | POA: Diagnosis not present

## 2018-03-06 DIAGNOSIS — C3411 Malignant neoplasm of upper lobe, right bronchus or lung: Secondary | ICD-10-CM | POA: Diagnosis not present

## 2018-03-06 DIAGNOSIS — Z7982 Long term (current) use of aspirin: Secondary | ICD-10-CM | POA: Diagnosis not present

## 2018-03-06 DIAGNOSIS — Z87891 Personal history of nicotine dependence: Secondary | ICD-10-CM | POA: Diagnosis not present

## 2018-03-06 DIAGNOSIS — Z853 Personal history of malignant neoplasm of breast: Secondary | ICD-10-CM | POA: Diagnosis not present

## 2018-03-06 DIAGNOSIS — J90 Pleural effusion, not elsewhere classified: Secondary | ICD-10-CM | POA: Insufficient documentation

## 2018-03-06 DIAGNOSIS — K7689 Other specified diseases of liver: Secondary | ICD-10-CM | POA: Insufficient documentation

## 2018-03-06 DIAGNOSIS — Z79899 Other long term (current) drug therapy: Secondary | ICD-10-CM | POA: Insufficient documentation

## 2018-03-06 DIAGNOSIS — J449 Chronic obstructive pulmonary disease, unspecified: Secondary | ICD-10-CM | POA: Diagnosis not present

## 2018-03-06 DIAGNOSIS — I10 Essential (primary) hypertension: Secondary | ICD-10-CM | POA: Insufficient documentation

## 2018-03-06 DIAGNOSIS — Z95828 Presence of other vascular implants and grafts: Secondary | ICD-10-CM

## 2018-03-06 DIAGNOSIS — C3490 Malignant neoplasm of unspecified part of unspecified bronchus or lung: Secondary | ICD-10-CM | POA: Diagnosis not present

## 2018-03-06 DIAGNOSIS — R110 Nausea: Secondary | ICD-10-CM

## 2018-03-06 DIAGNOSIS — T451X5A Adverse effect of antineoplastic and immunosuppressive drugs, initial encounter: Secondary | ICD-10-CM

## 2018-03-06 MED ORDER — GADOBENATE DIMEGLUMINE 529 MG/ML IV SOLN
10.0000 mL | Freq: Once | INTRAVENOUS | Status: AC | PRN
  Administered 2018-03-06: 7 mL via INTRAVENOUS

## 2018-03-06 MED ORDER — PROCHLORPERAZINE MALEATE 10 MG PO TABS: 10.0000 mg | ORAL_TABLET | Freq: Four times a day (QID) | ORAL | 3 refills | Status: DC | PRN

## 2018-03-06 MED ORDER — LIDOCAINE-PRILOCAINE 2.5-2.5 % EX CREA: 1.0000 "application " | TOPICAL_CREAM | CUTANEOUS | 6 refills | Status: DC | PRN

## 2018-03-06 MED ORDER — ONDANSETRON HCL 8 MG PO TABS: 8.0000 mg | ORAL_TABLET | Freq: Three times a day (TID) | ORAL | 3 refills | Status: DC | PRN

## 2018-03-06 NOTE — Patient Instructions (Signed)

## 2018-03-06 NOTE — Progress Notes (Signed)
NEW PATIENT EVALUATION  Name: Brenda Parker  MRN: [id number]  Date:   03/06/2018     DOB: May 30, 1936   This 82 y.o. female patient presents to the clinic for initial evaluation of stage III (T4 N0 M0) non-small cell lung, carcinoma of the right lobe favoring squamous cell carcinoma in patient previously treated 14 years prior forright breast cancer status post lumpectomy and external beam radiation  REFERRING PHYSICIAN: Leonel Ramsay, MD  CHIEF COMPLAINT:  Chief Complaint  Patient presents with  . Lung Cancer    Pt is here for initial consultation of lung cancer    DIAGNOSIS: The encounter diagnosis was Primary cancer of right upper lobe of lung ([place]).   PREVIOUS INVESTIGATIONS:  PET CT and CT scans reviewed brain scan to be reviewed after this evening Pathology reports reviewed Clinical notes reviewed  HPI: Brenda Parker is a 82 year old female well known to our department abdomen treated back in 2005 for early stage right breast cancer receiving external beam whole breast radiation status post lumpectomy. She's recently presented with weight loss cough was found to have a right lung mass.by scan she has a small right pleural effusion. She underwent bronchoscopy and biopsy which was positive for non-small cell lung cancer favoring squamous cell carcinoma.PET CT scan demonstrated right upper lobe suprahilar mass encasing the right mainstem bronchus including the right upper lobe bronchus extending to the carina.There are 2 satellite lesions present in the right upper lobe both hypermetabolic. Patient also did have a slight right pleural effusion.patient is scheduled for brain scan this evening to rule out metastatic disease she does have a history of slight morning headaches. She seen today for radiation oncology opinion.  PLANNED TREATMENT REGIMEN: concurrent chemoradiation therapy with curative intent  PAST MEDICAL HISTORY:  has a past medical history of Benign liver cyst, Breast cancer  ([place]) (2005), COPD (chronic obstructive pulmonary disease) ([place]), Dyspnea, Glaucoma, Hemoptysis (2019), Hypertension, Lung mass (01/2018), and Personal history of radiation therapy.    PAST SURGICAL HISTORY:  Past Surgical History:  Procedure Laterality Date  . BREAST EXCISIONAL BIOPSY Right 2005   positive, radiation  . CHOLECYSTECTOMY  1988  . COLONOSCOPY  2012  . ENDOBRONCHIAL ULTRASOUND N/A 02/21/2018   Procedure: ENDOBRONCHIAL ULTRASOUND;  Surgeon: Laverle Hobby, MD;  Location: [facility] ORS;  Service: Pulmonary;  Laterality: N/A;  . EYE SURGERY Bilateral 2009,2010   cataract extractions with lens implant  . GALLBLADDER SURGERY Bilateral   . JOINT REPLACEMENT Left 2008   left hip replacement  . TONSILLECTOMY  1960    FAMILY HISTORY: family history includes Breast cancer (age of onset: 57) in her mother.  SOCIAL HISTORY:  reports that she quit smoking about 12 years ago. Her smoking use included cigarettes. She smoked 1.00 pack per day. She has never used smokeless tobacco. She reports that she drank alcohol. She reports that she does not use drugs.  ALLERGIES: Penicillins  MEDICATIONS:  Current Outpatient Medications  Medication Sig Dispense Refill  . albuterol (PROVENTIL HFA) 108 (90 Base) MCG/ACT inhaler Inhale 2 puffs into the lungs every 6 (six) hours as needed for wheezing or shortness of breath.     Marland Kitchen aspirin EC 81 MG tablet Take 81 mg by mouth daily.    . Cholecalciferol (VITAMIN D3) 3000 units TABS Take 3,000 Units by mouth daily.     Marland Kitchen latanoprost (XALATAN) 0.005 % ophthalmic solution Place 1 drop into both eyes at bedtime.    . lidocaine-prilocaine (EMLA) cream Apply 1 application topically  as needed. To port a cath site 30 g 6  . ondansetron (ZOFRAN) 8 MG tablet Take 1 tablet (8 mg total) by mouth every 8 (eight) hours as needed for nausea or vomiting. 20 tablet 3  . prochlorperazine (COMPAZINE) 10 MG tablet Take 1 tablet (10 mg total) by mouth every 6 (six) hours  as needed for nausea or vomiting. 30 tablet 3  . doxycycline (VIBRAMYCIN) 100 MG capsule Take 1 capsule (100 mg total) by mouth 2 (two) times daily. (Patient not taking: Reported on 03/03/2018) 14 capsule 0   No current facility-administered medications for this encounter.     ECOG PERFORMANCE STATUS:  1 - Symptomatic but completely ambulatory  REVIEW OF SYSTEMS: except for the cough weight loss Patient denies any weight loss, fatigue, weakness, fever, chills or night sweats. Patient denies any loss of vision, blurred vision. Patient denies any ringing  of the ears or hearing loss. No irregular heartbeat. Patient denies heart murmur or history of fainting. Patient denies any chest pain or pain radiating to her upper extremities. Patient denies any shortness of breath, difficulty breathing at night, cough or hemoptysis. Patient denies any swelling in the lower legs. Patient denies any nausea vomiting, vomiting of blood, or coffee ground material in the vomitus. Patient denies any stomach pain. Patient states has had normal bowel movements no significant constipation or diarrhea. Patient denies any dysuria, hematuria or significant nocturia. Patient denies any problems walking, swelling in the joints or loss of balance. Patient denies any skin changes, loss of hair or loss of weight. Patient denies any excessive worrying or anxiety or significant depression. Patient denies any problems with insomnia. Patient denies excessive thirst, polyuria, polydipsia. Patient denies any swollen glands, patient denies easy bruising or easy bleeding. Patient denies any recent infections, allergies or URI. Patient "s visual fields have not changed significantly in recent time.    PHYSICAL EXAM: BP (!) 141/77   Pulse 86   Temp (!) 95.7 F (35.4 C)   Resp 18   Wt 145 lb 13.4 oz (66.2 kg)   BMI 25.03 kg/m  Well-developed well-nourished patient in NAD. HEENT reveals PERLA, EOMI, discs not visualized.  Oral cavity is  clear. No oral mucosal lesions are identified. Neck is clear without evidence of cervical or supraclavicular adenopathy. Lungs are clear to A&P. Cardiac examination is essentially unremarkable with regular rate and rhythm without murmur rub or thrill. Abdomen is benign with no organomegaly or masses noted. Motor sensory and DTR levels are equal and symmetric in the upper and lower extremities. Cranial nerves II through XII are grossly intact. Proprioception is intact. No peripheral adenopathy or edema is identified. No motor or sensory levels are noted. Crude visual fields are within normal range.  LABORATORY DATA: pathology and cytology reports reviewed    RADIOLOGY RESULTS:CT scan PET CT scans reviewed will review her brain scan when it becomes available   IMPRESSION: stage IIIanon-small cell lung cancer of the right lung in 83 year old female previously treated to her right breast 14 years prior with external beam radiation therapy  PLAN: at this time using I MRT radiation therapy treatment planning delivering believe we can spare her skin and focus radiation on the area of tumor involvement. I would plan on delivering 6600 cGy over 6 weeks. I would use PET CT fusion study during my treatment planning. I will use concurrent chemotherapy along with radiation. Risks and benefits of treatment including possible radiation esophagitis causing dysphasia alteration of blood counts fatigue skin  reaction all were discussed in detail with the patient. She seems to comprehend my treatment plan well.There will be extra effort by both professional staff as well as technical staff to coordinate and manage concurrent chemoradiation and ensuing side effects during her treatments.I first was set up and ordered CT simulation for later this week. Patient and son both seem to comprehend my treatment plan well.  I would like to take this opportunity to thank you for allowing me to participate in the care of your  patient.Noreene Filbert, MD

## 2018-03-06 NOTE — Telephone Encounter (Signed)
Per [facility]- she will send orders for [facility]

## 2018-03-06 NOTE — Telephone Encounter (Signed)
Request for omniseq testing has been faxed to pathology.

## 2018-03-06 NOTE — Telephone Encounter (Signed)
Port a cath has been scheduled for Friday, May 14'th  at 9 am. The patient will need to arrive at 8:00 am.  NPO- after midnight and pt must bring a driver.

## 2018-03-06 NOTE — Telephone Encounter (Signed)
Spoke with patient today in her chemotherapy class. She was made aware of these apts. Son also gave verbal understanding.

## 2018-03-07 ENCOUNTER — Telehealth: Payer: Self-pay | Admitting: *Deleted

## 2018-03-07 ENCOUNTER — Other Ambulatory Visit: Payer: Self-pay | Admitting: Internal Medicine

## 2018-03-07 NOTE — Telephone Encounter (Addendum)
Phone call returned. The apt times for port a cath and sims apts were reviewed with patient. and plan of care reviewed with patient.  She inquired about her MRI results. These results were reviewed with the patient. I explained that there was no evidence of malignancy in the brain. Pt states that she is very overwhelmed at the appointments. Reassurance was provided to patient.

## 2018-03-07 NOTE — Telephone Encounter (Signed)
Answering service faxed message:  Patient needs clarification about appointment on Friday. Please call her.  [phone]      dhs

## 2018-03-07 NOTE — Progress Notes (Signed)
START ON PATHWAY REGIMEN - Non-Small Cell Lung     Administer weekly:     Paclitaxel      Carboplatin   **Always confirm dose/schedule in your pharmacy ordering system**    Patient Characteristics: Stage III - Unresectable, PS = 0, 1 AJCC T Category: T4 Current Disease Status: No Distant Mets or Local Recurrence AJCC N Category: N0 AJCC M Category: M0 AJCC 8 Stage Grouping: IIIA Performance Status: PS = 0, 1 Intent of Therapy: Curative Intent, Discussed with Patient

## 2018-03-08 ENCOUNTER — Ambulatory Visit
Admission: RE | Admit: 2018-03-08 | Discharge: 2018-03-08 | Disposition: A | Discharge status: Home / Self Care | Payer: Medicare Other | Source: Ambulatory Visit | Attending: Radiation Oncology | Admitting: Radiation Oncology

## 2018-03-08 ENCOUNTER — Encounter: Payer: Self-pay | Admitting: *Deleted

## 2018-03-08 DIAGNOSIS — C3411 Malignant neoplasm of upper lobe, right bronchus or lung: Secondary | ICD-10-CM | POA: Insufficient documentation

## 2018-03-08 DIAGNOSIS — Z87891 Personal history of nicotine dependence: Secondary | ICD-10-CM | POA: Insufficient documentation

## 2018-03-08 DIAGNOSIS — Z510 Encounter for antineoplastic radiation therapy: Secondary | ICD-10-CM | POA: Insufficient documentation

## 2018-03-08 DIAGNOSIS — Z853 Personal history of malignant neoplasm of breast: Secondary | ICD-10-CM | POA: Diagnosis not present

## 2018-03-09 ENCOUNTER — Other Ambulatory Visit: Payer: Self-pay | Admitting: Radiology

## 2018-03-09 NOTE — Progress Notes (Signed)
  Oncology Nurse Navigator Documentation  Navigator Location: [facility]Med [facility]c (03/08/18 1145) Referral date to RadOnc/MedOnc: 02/28/18 (03/08/18 1145) )Navigator Encounter Type: Follow-up Appt (03/08/18 1145)   Abnormal Finding Date: 01/17/18 (03/08/18 1145) Confirmed Diagnosis Date: 02/24/18 (03/08/18 1145)               Patient Visit Type: RadOnc (03/08/18 1145) Treatment Phase: CT SIM (03/08/18 1145) Barriers/Navigation Needs: Coordination of Care (03/08/18 1145)   Interventions: Coordination of Care (03/08/18 1145)   Coordination of Care: Appts;Chemo (03/08/18 1145)        Acuity: Level 1 (03/08/18 1145) Acuity Level 1: Initial guidance, education and coordination as needed (03/08/18 1145)  met with patient after CT simulation to review upcoming appts. All questions answered at the time of visit. Reviewed with patient date for port placement and next visit with Dr. Rogue Bussing to start chemotherapy. Pt had no further questions. Encouraged to call if needs anything further. Pt verbalized understanding.     Time Spent with Patient: 45 (03/08/18 1145)

## 2018-03-10 ENCOUNTER — Ambulatory Visit
Admission: RE | Admit: 2018-03-10 | Discharge: 2018-03-10 | Disposition: A | Discharge status: Home / Self Care | Payer: Medicare Other | Source: Ambulatory Visit | Attending: Internal Medicine | Admitting: Internal Medicine

## 2018-03-10 DIAGNOSIS — Z853 Personal history of malignant neoplasm of breast: Secondary | ICD-10-CM | POA: Diagnosis not present

## 2018-03-10 DIAGNOSIS — C3411 Malignant neoplasm of upper lobe, right bronchus or lung: Secondary | ICD-10-CM | POA: Diagnosis not present

## 2018-03-10 DIAGNOSIS — C3491 Malignant neoplasm of unspecified part of right bronchus or lung: Secondary | ICD-10-CM | POA: Diagnosis not present

## 2018-03-10 DIAGNOSIS — H409 Unspecified glaucoma: Secondary | ICD-10-CM | POA: Diagnosis not present

## 2018-03-10 DIAGNOSIS — Z96642 Presence of left artificial hip joint: Secondary | ICD-10-CM | POA: Diagnosis not present

## 2018-03-10 DIAGNOSIS — Z7982 Long term (current) use of aspirin: Secondary | ICD-10-CM | POA: Diagnosis not present

## 2018-03-10 DIAGNOSIS — Z9049 Acquired absence of other specified parts of digestive tract: Secondary | ICD-10-CM | POA: Insufficient documentation

## 2018-03-10 DIAGNOSIS — Z803 Family history of malignant neoplasm of breast: Secondary | ICD-10-CM | POA: Insufficient documentation

## 2018-03-10 DIAGNOSIS — Z79899 Other long term (current) drug therapy: Secondary | ICD-10-CM | POA: Insufficient documentation

## 2018-03-10 DIAGNOSIS — Z9841 Cataract extraction status, right eye: Secondary | ICD-10-CM | POA: Diagnosis not present

## 2018-03-10 DIAGNOSIS — I10 Essential (primary) hypertension: Secondary | ICD-10-CM | POA: Diagnosis not present

## 2018-03-10 DIAGNOSIS — Z87891 Personal history of nicotine dependence: Secondary | ICD-10-CM | POA: Diagnosis not present

## 2018-03-10 DIAGNOSIS — Z9842 Cataract extraction status, left eye: Secondary | ICD-10-CM | POA: Diagnosis not present

## 2018-03-10 DIAGNOSIS — J449 Chronic obstructive pulmonary disease, unspecified: Secondary | ICD-10-CM | POA: Insufficient documentation

## 2018-03-10 DIAGNOSIS — Z452 Encounter for adjustment and management of vascular access device: Secondary | ICD-10-CM | POA: Diagnosis not present

## 2018-03-10 DIAGNOSIS — Z9889 Other specified postprocedural states: Secondary | ICD-10-CM | POA: Insufficient documentation

## 2018-03-10 DIAGNOSIS — Z880 Allergy status to penicillin: Secondary | ICD-10-CM | POA: Diagnosis not present

## 2018-03-10 HISTORY — PX: IR FLUORO GUIDE PORT INSERTION RIGHT: IMG5741

## 2018-03-10 HISTORY — PX: PORTACATH PLACEMENT: SHX2246

## 2018-03-10 LAB — PROTIME-INR
INR: 1.06
Prothrombin Time: 13.7 seconds (ref 11.4–15.2)

## 2018-03-10 MED ORDER — HEPARIN (PORCINE) IN NACL 1000-0.9 UT/500ML-% IV SOLN
INTRAVENOUS | Status: AC
  Filled 2018-03-10: qty 500

## 2018-03-10 MED ORDER — VANCOMYCIN HCL IN DEXTROSE 1-5 GM/200ML-% IV SOLN
1000.0000 mg | INTRAVENOUS | Status: AC
  Administered 2018-03-10: 1000 mg via INTRAVENOUS
  Filled 2018-03-10: qty 200

## 2018-03-10 MED ORDER — FENTANYL CITRATE (PF) 100 MCG/2ML IJ SOLN
INTRAMUSCULAR | Status: AC
  Filled 2018-03-10: qty 2

## 2018-03-10 MED ORDER — IOPAMIDOL (ISOVUE-300) INJECTION 61%: 30.0000 mL | Freq: Once | INTRAVENOUS | Status: DC | PRN

## 2018-03-10 MED ORDER — MIDAZOLAM HCL 5 MG/5ML IJ SOLN
INTRAMUSCULAR | Status: AC
  Filled 2018-03-10: qty 5

## 2018-03-10 MED ORDER — MIDAZOLAM HCL 2 MG/2ML IJ SOLN
INTRAMUSCULAR | Status: AC | PRN
  Administered 2018-03-10 (×2): 1 mg via INTRAVENOUS

## 2018-03-10 MED ORDER — SODIUM CHLORIDE 0.9 % IV SOLN
INTRAVENOUS | Status: DC
  Administered 2018-03-10: 09:00:00 via INTRAVENOUS

## 2018-03-10 MED ORDER — FENTANYL CITRATE (PF) 100 MCG/2ML IJ SOLN
INTRAMUSCULAR | Status: AC | PRN
  Administered 2018-03-10 (×2): 25 ug via INTRAVENOUS

## 2018-03-10 MED ORDER — LIDOCAINE-EPINEPHRINE (PF) 1 %-1:200000 IJ SOLN
INTRAMUSCULAR | Status: AC
  Filled 2018-03-10: qty 30

## 2018-03-10 MED ORDER — HEPARIN SOD (PORK) LOCK FLUSH 100 UNIT/ML IV SOLN
INTRAVENOUS | Status: AC
  Filled 2018-03-10: qty 5

## 2018-03-10 NOTE — Procedures (Signed)
Placement of left jugular port.  Tip at SVC/RA junction.  Minimal blood loss and no immediate complication.

## 2018-03-10 NOTE — H&P (Signed)
Chief Complaint: Patient was seen in consultation today for port placement at the request of [name],Brenda Parker  Referring Physician(s): [name]  Supervising Physician: Markus Daft  Patient Status: [facility] - Out-pt  History of Present Illness: Brenda Parker is a 82 y.o. female with newly diagnosed lung cancer. She is to begin both chemotherapy and radiation therapy. She is referred for port placement. PMHx, meds, labs, imaging, allergies reviewed. Feels well, no recent fevers, chills, illness. Has been NPO today as directed. Family at bedside.   Past Medical History:  Diagnosis Date  . Benign liver cyst   . Breast cancer ([place]) 2005   right breast  . COPD (chronic obstructive pulmonary disease) ([place])   . Dyspnea   . Glaucoma   . Hemoptysis 2019  . Hypertension    Not on medication for htn. patient denies this  . Lung mass 01/2018  . Personal history of radiation therapy     Past Surgical History:  Procedure Laterality Date  . BREAST EXCISIONAL BIOPSY Right 2005   positive, radiation  . CHOLECYSTECTOMY  1988  . COLONOSCOPY  2012  . ENDOBRONCHIAL ULTRASOUND N/A 02/21/2018   Procedure: ENDOBRONCHIAL ULTRASOUND;  Surgeon: Laverle Hobby, MD;  Location: [facility] ORS;  Service: Pulmonary;  Laterality: N/A;  . EYE SURGERY Bilateral 2009,2010   cataract extractions with lens implant  . GALLBLADDER SURGERY Bilateral   . JOINT REPLACEMENT Left 2008   left hip replacement  . TONSILLECTOMY  1960    Allergies: Penicillins  Medications: Prior to Admission medications   Medication Sig Start Date End Date Taking? Authorizing Provider  latanoprost (XALATAN) 0.005 % ophthalmic solution Place 1 drop into both eyes at bedtime. 12/25/17  Yes [provider]  albuterol (PROVENTIL HFA) 108 (90 Base) MCG/ACT inhaler Inhale 2 puffs into the lungs every 6 (six) hours as needed for wheezing or shortness of breath.  09/28/17 09/29/18  [provider]    aspirin EC 81 MG tablet Take 81 mg by mouth daily.    [provider]  Cholecalciferol (VITAMIN D3) 3000 units TABS Take 3,000 Units by mouth daily.     [provider]  doxycycline (VIBRAMYCIN) 100 MG capsule Take 1 capsule (100 mg total) by mouth 2 (two) times daily. Patient not taking: Reported on 03/03/2018 02/28/18   Laverle Hobby, MD  lidocaine-prilocaine (EMLA) cream Apply 1 application topically as needed. To port a cath site 03/06/18   [name], MD  ondansetron (ZOFRAN) 8 MG tablet Take 1 tablet (8 mg total) by mouth every 8 (eight) hours as needed for nausea or vomiting. 03/06/18   [name], MD  prochlorperazine (COMPAZINE) 10 MG tablet Take 1 tablet (10 mg total) by mouth every 6 (six) hours as needed for nausea or vomiting. 03/06/18   [name], MD     Family History  Problem Relation Age of Onset  . Breast cancer Mother 12    Social History   Socioeconomic History  . Marital status: Widowed    Spouse name: Not on file  . Number of children: Not on file  . Years of education: Not on file  . Highest education level: Not on file  Occupational History  . Not on file  Social Needs  . Financial resource strain: Not on file  . Food insecurity:    Worry: Not on file    Inability: Not on file  . Transportation needs:    Medical: Not on file    Non-medical:  Not on file  Tobacco Use  . Smoking status: Former Smoker    Packs/day: 1.00    Types: Cigarettes    Last attempt to quit: 10/25/2005    Years since quitting: 12.3  . Smokeless tobacco: Never Used  Substance and Sexual Activity  . Alcohol use: Not Currently    Comment: occasional glass of beer  . Drug use: Never  . Sexual activity: Not Currently  Lifestyle  . Physical activity:    Days per week: Not on file    Minutes per session: Not on file  . Stress: Not on file  Relationships  . Social connections:    Talks on phone: Not on file    Gets together:  Not on file    Attends religious service: Not on file    Active member of club or organization: Not on file    Attends meetings of clubs or organizations: Not on file    Relationship status: Not on file  Other Topics Concern  . Not on file  Social History Narrative  . Not on file     Review of Systems: A 12 point ROS discussed and pertinent positives are indicated in the HPI above.  All other systems are negative.  Review of Systems  Vital Signs: BP 135/73   Pulse 77   Temp 97.7 F (36.5 C) (Oral)   Resp (!) 22   Ht 5\' 4"  (1.626 m)   Wt 145 lb (65.8 kg)   SpO2 98%   BMI 24.89 kg/m   Physical Exam  Constitutional: She is oriented to person, place, and time. She appears well-developed. No distress.  HENT:  Head: Normocephalic.  Mouth/Throat: Oropharynx is clear and moist.  Neck: Normal range of motion. No JVD present. No tracheal deviation present.  Cardiovascular: Normal rate, regular rhythm and normal heart sounds.  Neurological: She is alert and oriented to person, place, and time.  Skin: Skin is warm and dry. No erythema.  (Parker)chest/subclavicle region radiation markers noted.  Psychiatric: She has a normal mood and affect.    Imaging: Mr Brenda Parker DT Contrast  Result Date: 03/06/2018 CLINICAL DATA:  Lung cancer staging. Additional history of breast cancer. EXAM: MRI HEAD WITHOUT AND WITH CONTRAST TECHNIQUE: Multiplanar, multiecho pulse sequences of the brain and surrounding structures were obtained without and with intravenous contrast. CONTRAST:  46mL MULTIHANCE GADOBENATE DIMEGLUMINE 529 MG/ML IV SOLN COMPARISON:  None. FINDINGS: BRAIN: The midline structures are normal. There is no acute infarct or acute hemorrhage. No mass lesion, hydrocephalus, dural abnormality or extra-axial collection. Early confluent hyperintense T2-weighted signal of the periventricular and deep white matter, most commonly due to chronic ischemic microangiopathy. No age-advanced or lobar  predominant atrophy. No chronic microhemorrhage or superficial siderosis. No abnormal contrast enhancement. VASCULAR: Major intracranial arterial and venous sinus flow voids are preserved. SKULL AND UPPER CERVICAL SPINE: The visualized skull base, calvarium, upper cervical spine and extracranial soft tissues are normal. SINUSES/ORBITS: No fluid levels or advanced mucosal thickening. No mastoid or middle ear effusion. Normal orbits. IMPRESSION: No intracranial metastatic disease. Electronically Signed   By: Ulyses Jarred M.D.   On: 03/06/2018 22:16    Labs:  CBC: Recent Labs    03/03/18 1132  WBC 9.0  HGB 11.1*  HCT 33.6*  PLT 501*    COAGS: No results for input(s): INR, APTT in the last 8760 hours.  BMP: Recent Labs    03/03/18 1132  NA 139  K 4.7  CL 107  CO2 22  GLUCOSE 110*  BUN 22*  CALCIUM 9.9  CREATININE 1.28*  GFRNONAA 39*  GFRAA 45*    LIVER FUNCTION TESTS: Recent Labs    03/03/18 1132  BILITOT 0.7  AST 18  ALT 14  ALKPHOS 92  PROT 8.0  ALBUMIN 3.8    TUMOR MARKERS: No results for input(s): AFPTM, CEA, CA199, CHROMGRNA in the last 8760 hours.  Assessment and Plan: Lung cancer For port placement Labs pending Risks and benefits of image guided port-a-catheter placement was discussed with the patient including, but not limited to bleeding, infection, pneumothorax, or fibrin sheath development and need for additional procedures.  All of the patient's questions were answered, patient is agreeable to proceed. Consent signed and in chart.    Thank you for this interesting consult.  I greatly enjoyed meeting LYNDEL DANCEL and look forward to participating in their care.  A copy of this report was sent to the requesting provider on this date.  Electronically Signed: Ascencion Dike, PA-C 03/10/2018, 8:38 AM   I spent a total of 20 minutes in face to face in clinical consultation, greater than 50% of which was counseling/coordinating care for port  placement

## 2018-03-13 ENCOUNTER — Encounter: Payer: Self-pay | Admitting: Internal Medicine

## 2018-03-13 ENCOUNTER — Inpatient Hospital Stay: Payer: Medicare Other

## 2018-03-13 ENCOUNTER — Inpatient Hospital Stay (HOSPITAL_BASED_OUTPATIENT_CLINIC_OR_DEPARTMENT_OTHER): Payer: Medicare Other | Admitting: Internal Medicine

## 2018-03-13 ENCOUNTER — Encounter: Payer: Self-pay | Admitting: *Deleted

## 2018-03-13 ENCOUNTER — Other Ambulatory Visit: Payer: Self-pay

## 2018-03-13 VITALS — BP 140/89 | HR 84 | Temp 97.9°F | Resp 20 | Ht 64.0 in | Wt 142.6 lb

## 2018-03-13 VITALS — BP 129/81 | HR 77 | Resp 20

## 2018-03-13 DIAGNOSIS — R5383 Other fatigue: Secondary | ICD-10-CM | POA: Diagnosis not present

## 2018-03-13 DIAGNOSIS — J449 Chronic obstructive pulmonary disease, unspecified: Secondary | ICD-10-CM | POA: Diagnosis not present

## 2018-03-13 DIAGNOSIS — R51 Headache: Secondary | ICD-10-CM | POA: Diagnosis not present

## 2018-03-13 DIAGNOSIS — Z87891 Personal history of nicotine dependence: Secondary | ICD-10-CM | POA: Diagnosis not present

## 2018-03-13 DIAGNOSIS — Z853 Personal history of malignant neoplasm of breast: Secondary | ICD-10-CM | POA: Diagnosis not present

## 2018-03-13 DIAGNOSIS — Z5111 Encounter for antineoplastic chemotherapy: Secondary | ICD-10-CM | POA: Diagnosis not present

## 2018-03-13 DIAGNOSIS — C3411 Malignant neoplasm of upper lobe, right bronchus or lung: Secondary | ICD-10-CM

## 2018-03-13 DIAGNOSIS — R634 Abnormal weight loss: Secondary | ICD-10-CM | POA: Diagnosis not present

## 2018-03-13 DIAGNOSIS — Z510 Encounter for antineoplastic radiation therapy: Secondary | ICD-10-CM | POA: Diagnosis present

## 2018-03-13 DIAGNOSIS — R630 Anorexia: Secondary | ICD-10-CM

## 2018-03-13 DIAGNOSIS — J90 Pleural effusion, not elsewhere classified: Secondary | ICD-10-CM | POA: Diagnosis not present

## 2018-03-13 LAB — CBC WITH DIFFERENTIAL/PLATELET
BASOS ABS: 0.1 10*3/uL (ref 0–0.1)
Basophils Relative: 1 %
Eosinophils Absolute: 0.2 10*3/uL (ref 0–0.7)
Eosinophils Relative: 2 %
HEMATOCRIT: 31.3 % — AB (ref 35.0–47.0)
Hemoglobin: 10.4 g/dL — ABNORMAL LOW (ref 12.0–16.0)
LYMPHS PCT: 11 %
Lymphs Abs: 0.9 10*3/uL — ABNORMAL LOW (ref 1.0–3.6)
MCH: 28.2 pg (ref 26.0–34.0)
MCHC: 33.2 g/dL (ref 32.0–36.0)
MCV: 85 fL (ref 80.0–100.0)
Monocytes Absolute: 0.6 10*3/uL (ref 0.2–0.9)
Monocytes Relative: 7 %
NEUTROS ABS: 7 10*3/uL — AB (ref 1.4–6.5)
Neutrophils Relative %: 79 %
Platelets: 297 10*3/uL (ref 150–440)
RBC: 3.68 MIL/uL — AB (ref 3.80–5.20)
RDW: 16.4 % — ABNORMAL HIGH (ref 11.5–14.5)
WBC: 8.8 10*3/uL (ref 3.6–11.0)

## 2018-03-13 LAB — COMPREHENSIVE METABOLIC PANEL
ALK PHOS: 80 U/L (ref 38–126)
ALT: 17 U/L (ref 14–54)
AST: 25 U/L (ref 15–41)
Albumin: 3.2 g/dL — ABNORMAL LOW (ref 3.5–5.0)
Anion gap: 9 (ref 5–15)
BILIRUBIN TOTAL: 0.8 mg/dL (ref 0.3–1.2)
BUN: 20 mg/dL (ref 6–20)
CALCIUM: 9.2 mg/dL (ref 8.9–10.3)
CHLORIDE: 107 mmol/L (ref 101–111)
CO2: 22 mmol/L (ref 22–32)
CREATININE: 1.17 mg/dL — AB (ref 0.44–1.00)
GFR calc Af Amer: 50 mL/min — ABNORMAL LOW (ref >60)
GFR, EST NON AFRICAN AMERICAN: 43 mL/min — AB (ref >60)
Glucose, Bld: 109 mg/dL — ABNORMAL HIGH (ref 65–99)
Potassium: 3.6 mmol/L (ref 3.5–5.1)
Sodium: 138 mmol/L (ref 135–145)
TOTAL PROTEIN: 7 g/dL (ref 6.5–8.1)

## 2018-03-13 MED ORDER — SODIUM CHLORIDE 0.9 % IV SOLN
130.0000 mg | Freq: Once | INTRAVENOUS | Status: AC
  Administered 2018-03-13: 130 mg via INTRAVENOUS
  Filled 2018-03-13: qty 13

## 2018-03-13 MED ORDER — DEXAMETHASONE SODIUM PHOSPHATE 100 MG/10ML IJ SOLN
20.0000 mg | Freq: Once | INTRAMUSCULAR | Status: AC
  Administered 2018-03-13: 20 mg via INTRAVENOUS
  Filled 2018-03-13: qty 2

## 2018-03-13 MED ORDER — DIPHENHYDRAMINE HCL 50 MG/ML IJ SOLN
25.0000 mg | Freq: Once | INTRAMUSCULAR | Status: AC
  Administered 2018-03-13: 25 mg via INTRAVENOUS
  Filled 2018-03-13: qty 1

## 2018-03-13 MED ORDER — FAMOTIDINE IN NACL 20-0.9 MG/50ML-% IV SOLN
20.0000 mg | Freq: Once | INTRAVENOUS | Status: AC
  Administered 2018-03-13: 20 mg via INTRAVENOUS
  Filled 2018-03-13: qty 50

## 2018-03-13 MED ORDER — FLUTICASONE-SALMETEROL 500-50 MCG/DOSE IN AEPB: 1.0000 | INHALATION_SPRAY | Freq: Two times a day (BID) | RESPIRATORY_TRACT | 3 refills | Status: DC

## 2018-03-13 MED ORDER — HEPARIN SOD (PORK) LOCK FLUSH 100 UNIT/ML IV SOLN
500.0000 [IU] | Freq: Once | INTRAVENOUS | Status: AC
  Administered 2018-03-13: 500 [IU] via INTRAVENOUS
  Filled 2018-03-13: qty 5

## 2018-03-13 MED ORDER — PALONOSETRON HCL INJECTION 0.25 MG/5ML
0.2500 mg | Freq: Once | INTRAVENOUS | Status: AC
  Administered 2018-03-13: 0.25 mg via INTRAVENOUS
  Filled 2018-03-13: qty 5

## 2018-03-13 MED ORDER — SODIUM CHLORIDE 0.9 % IV SOLN
45.0000 mg/m2 | Freq: Once | INTRAVENOUS | Status: AC
  Administered 2018-03-13: 78 mg via INTRAVENOUS
  Filled 2018-03-13: qty 13

## 2018-03-13 MED ORDER — SODIUM CHLORIDE 0.9% FLUSH
10.0000 mL | Freq: Once | INTRAVENOUS | Status: AC
  Administered 2018-03-13: 10 mL via INTRAVENOUS
  Filled 2018-03-13: qty 10

## 2018-03-13 MED ORDER — SODIUM CHLORIDE 0.9 % IV SOLN
Freq: Once | INTRAVENOUS | Status: AC
  Administered 2018-03-13: 11:00:00 via INTRAVENOUS
  Filled 2018-03-13: qty 1000

## 2018-03-13 NOTE — Progress Notes (Signed)
[facility] OFFICE PROGRESS NOTE  Patient Care Team: Leonel Ramsay, MD as PCP - General (Infectious Diseases) Telford Nab, RN as Registered Nurse  Cancer Staging No matching staging information was found for the patient.   Oncology History   # RUL LUNG CANCER-non-small cell; favor squamous cell; PET scan T4N0 [right upper lobe mass involving[mediastinal/blood results] vs small pleural effusion [M1]    # Molecular testing-Omniseq  # Hx of Right breast ca [s/p lumpec; RT; No chemo; "pill" x5 years]  # COPD/Hx of smoking  --------------------------------------------------    DIAGNOSIS: [ April 2019]SQUAMOUS CELL LUNG CA  STAGE: III ;GOALS: CURATIVE  CURRENT/MOST RECENT THERAPY [ May 2019]- Carbo-taxol with RT .       Primary cancer of right upper lobe of lung ([place])   03/06/2018 -  Chemotherapy    The patient had palonosetron (ALOXI) injection 0.25 mg, 0.25 mg, Intravenous,  Once, 1 of 7 cycles Administration: 0.25 mg (03/13/2018) CARBOplatin (PARAPLATIN) 130 mg in sodium chloride 0.9 % 250 mL chemo infusion, 130 mg (100 % of original dose 131.4 mg), Intravenous,  Once, 1 of 7 cycles Dose modification:   (original dose 131.4 mg, Cycle 1) Administration: 130 mg (03/13/2018) PACLitaxel (TAXOL) 78 mg in sodium chloride 0.9 % 250 mL chemo infusion (</= 80mg /m2), 45 mg/m2 = 78 mg, Intravenous,  Once, 1 of 7 cycles Administration: 78 mg (03/13/2018)  for chemotherapy treatment.          INTERVAL HISTORY:  Brenda Parker 82 y.o.  female pleasant patient above history of newly diagnosed stage III lung cancer is here to proceed with chemo therapy.  Patient continues to have chronic mild shortness of breath.  Continues to have mild fatigue.  Positive for weight loss poor appetite.  Continues to have intermittent headaches.  Review of Systems  Constitutional: Positive for malaise/fatigue and weight loss. Negative for chills, diaphoresis and fever.  HENT:  Negative for nosebleeds and sore throat.   Eyes: Negative for double vision.  Respiratory: Positive for shortness of breath. Negative for cough, hemoptysis, sputum production and wheezing.   Cardiovascular: Negative for chest pain, palpitations, orthopnea and leg swelling.  Gastrointestinal: Negative for abdominal pain, blood in stool, constipation, diarrhea, heartburn, melena, nausea and vomiting.  Genitourinary: Negative for dysuria, frequency and urgency.  Musculoskeletal: Negative for back pain and joint pain.  Skin: Negative.  Negative for itching and rash.  Neurological: Positive for headaches. Negative for dizziness, tingling, focal weakness and weakness.  Endo/Heme/Allergies: Does not bruise/bleed easily.  Psychiatric/Behavioral: Negative for depression. The patient is nervous/anxious.       PAST MEDICAL HISTORY :  Past Medical History:  Diagnosis Date  . Benign liver cyst   . Breast cancer ([place]) 2005   right breast  . COPD (chronic obstructive pulmonary disease) ([place])   . Dyspnea   . Glaucoma   . Hemoptysis 2019  . Hypertension    Not on medication for htn. patient denies this  . Lung mass 01/2018  . Personal history of radiation therapy     PAST SURGICAL HISTORY :   Past Surgical History:  Procedure Laterality Date  . BREAST EXCISIONAL BIOPSY Right 2005   positive, radiation  . CHOLECYSTECTOMY  1988  . COLONOSCOPY  2012  . ENDOBRONCHIAL ULTRASOUND N/A 02/21/2018   Procedure: ENDOBRONCHIAL ULTRASOUND;  Surgeon: Laverle Hobby, MD;  Location: [facility] ORS;  Service: Pulmonary;  Laterality: N/A;  . EYE SURGERY Bilateral 2009,2010   cataract extractions with lens implant  .  GALLBLADDER SURGERY Bilateral   . IR FLUORO GUIDE PORT INSERTION RIGHT  03/10/2018  . JOINT REPLACEMENT Left 2008   left hip replacement  . PORTACATH PLACEMENT  03/10/2018  . TONSILLECTOMY  1960    FAMILY HISTORY :   Family History  Problem Relation Age of Onset  . Breast cancer Mother 4     SOCIAL HISTORY:   Social History   Tobacco Use  . Smoking status: Former Smoker    Packs/day: 1.00    Types: Cigarettes    Last attempt to quit: 10/25/2005    Years since quitting: 12.3  . Smokeless tobacco: Never Used  Substance Use Topics  . Alcohol use: Not Currently    Comment: occasional glass of beer  . Drug use: Never    ALLERGIES:  is allergic to penicillins.  MEDICATIONS:  Current Outpatient Medications  Medication Sig Dispense Refill  . albuterol (PROVENTIL HFA) 108 (90 Base) MCG/ACT inhaler Inhale 2 puffs into the lungs every 6 (six) hours as needed for wheezing or shortness of breath.     Marland Kitchen aspirin EC 81 MG tablet Take 81 mg by mouth daily.    . Cholecalciferol (VITAMIN D3) 3000 units TABS Take 3,000 Units by mouth daily.     Marland Kitchen latanoprost (XALATAN) 0.005 % ophthalmic solution Place 1 drop into both eyes at bedtime.    . lidocaine-prilocaine (EMLA) cream Apply 1 application topically as needed. To port a cath site 30 g 6  . Fluticasone-Salmeterol (ADVAIR DISKUS) 500-50 MCG/DOSE AEPB Inhale 1 puff into the lungs 2 (two) times daily. 1 each 3  . ondansetron (ZOFRAN) 8 MG tablet Take 1 tablet (8 mg total) by mouth every 8 (eight) hours as needed for nausea or vomiting. (Patient not taking: Reported on 03/13/2018) 20 tablet 3  . prochlorperazine (COMPAZINE) 10 MG tablet Take 1 tablet (10 mg total) by mouth every 6 (six) hours as needed for nausea or vomiting. (Patient not taking: Reported on 03/13/2018) 30 tablet 3   No current facility-administered medications for this visit.     PHYSICAL EXAMINATION: ECOG PERFORMANCE STATUS: 1 - Symptomatic but completely ambulatory  BP 140/89   Pulse 84   Temp 97.9 F (36.6 C) (Tympanic)   Resp 20   Ht 5\' 4"  (1.626 m)   Wt 142 lb 9.6 oz (64.7 kg)   BMI 24.48 kg/m   Filed Weights   03/13/18 0950  Weight: 142 lb 9.6 oz (64.7 kg)    GENERAL: Well-nourished well-developed; Alert, no distress and comfortable.  Accompanied by  her son. EYES: no pallor or icterus OROPHARYNX: no thrush or ulceration; NECK: supple; no lymph nodes felt. LYMPH:  no palpable lymphadenopathy in the axillary or inguinal regions LUNGS: Decreased breath sounds auscultation bilaterally. No wheeze or crackles HEART/CVS: regular rate & rhythm and no murmurs; No lower extremity edema ABDOMEN:abdomen soft, non-tender and normal bowel sounds. No hepatomegaly or splenomegaly.  Musculoskeletal:no cyanosis of digits and no clubbing  PSYCH: alert & oriented x 3 with fluent speech NEURO: no focal motor/sensory deficits SKIN:  no rashes or significant lesions    LABORATORY DATA:  I have reviewed the data as listed    Component Value Date/Time   NA 138 03/13/2018 0925   K 3.6 03/13/2018 0925   CL 107 03/13/2018 0925   CO2 22 03/13/2018 0925   GLUCOSE 109 (H) 03/13/2018 0925   BUN 20 03/13/2018 0925   CREATININE 1.17 (H) 03/13/2018 0925   CALCIUM 9.2 03/13/2018 0925   PROT  7.0 03/13/2018 0925   ALBUMIN 3.2 (L) 03/13/2018 0925   AST 25 03/13/2018 0925   ALT 17 03/13/2018 0925   ALKPHOS 80 03/13/2018 0925   BILITOT 0.8 03/13/2018 0925   GFRNONAA 43 (L) 03/13/2018 0925   GFRAA 50 (L) 03/13/2018 0925    No results found for: SPEP, UPEP  Lab Results  Component Value Date   WBC 8.8 03/13/2018   NEUTROABS 7.0 (H) 03/13/2018   HGB 10.4 (L) 03/13/2018   HCT 31.3 (L) 03/13/2018   MCV 85.0 03/13/2018   PLT 297 03/13/2018      Chemistry      Component Value Date/Time   NA 138 03/13/2018 0925   K 3.6 03/13/2018 0925   CL 107 03/13/2018 0925   CO2 22 03/13/2018 0925   BUN 20 03/13/2018 0925   CREATININE 1.17 (H) 03/13/2018 0925      Component Value Date/Time   CALCIUM 9.2 03/13/2018 0925   ALKPHOS 80 03/13/2018 0925   AST 25 03/13/2018 0925   ALT 17 03/13/2018 0925   BILITOT 0.8 03/13/2018 0925       RADIOGRAPHIC STUDIES: I have personally reviewed the radiological images as listed and agreed with the findings in the  report. No results found.   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  # Proceed with carbo-taxol with RT.  Labs today reviewed;  acceptable for treatment today.   #COPD/worse-recommend adding Advair.  New prescription given.  # headaches- MRI brain neg; question etiology.  Recommend NSAID use.  # weight loss/ apetite-discussed regarding protein intake referral Jolie.   # follow up in 5/28-cabo-taxol; follow-up with me in 2 weeks/labs chemotherapy   Orders Placed This Encounter  Procedures  . CBC with Differential    Standing Status:   Standing    Number of Occurrences:   20    Standing Expiration Date:   03/14/2019  . Basic metabolic panel    Standing Status:   Future    Standing Expiration Date:   03/13/2019  . Comprehensive metabolic panel    Standing Status:   Future    Standing Expiration Date:   03/13/2019   All questions were answered. The patient knows to call the clinic with any problems, questions or concerns.      [name], MD 03/14/2018 4:36 PM

## 2018-03-13 NOTE — Progress Notes (Signed)
Nutrition Assessment   Reason for Assessment:   Patient identified on Malnutrition Screening report for poor appetite and weight loss  ASSESSMENT:  82 year old female with new diagnosis of lung cancer.  Past medical history of breast cancer s/p lumpectomy 2005, COPD, HTN.  Patient receiving carbo taxol today and starting radition therapy soon.    Met with patient during infusion this am. Patient reports poor appetite for couple months.  Noted shortness of breath during meeting today.  Reports that sometimes skips breakfast and sometimes eats cereal with 2% milk, lunch is sandwich with chips and dinner sometimes frozen dinner or goes out to eat.  Reports that she lives alone and prepares meals.  Sometimes will go out to eat with friends or son.  Has not tried ensure/boost shakes yet but has been given samples.    Nutrition Focused Physical Exam: deferred  Medications: Vit D3  Labs: glucose 110, BUN 22, creatinine 1.28.    Anthropometrics:   Height: 64 inches Weight: 142 lb 9.6 oz UBW: 190 lb (about 6 months ago per patient) BMI: 24  25% weight loss in the last 6 months, signficant  Reports she does not want to regain weight  Estimated Energy Needs  Kcals: 1600-1900 calories/d Protein: 80-95 g/d Fluid: 1.9 L/d  NUTRITION DIAGNOSIS: Inadequate oral intake related to shortness of breath, lung cancer, poor appetite as evidenced by weight loss of 25% in the last 6 months   INTERVENTION:   Discussed importance of good nutrition during treatment. Discussed foods high in calories and protein and list of those foods provided to patient.   Encouraged patient to try oral nutrition supplements.  Encouraged higher calorie shakes.  Patient reports "I don't want to regain my weight."     MONITORING, EVALUATION, GOAL: Patient will consume adequate calories and protein to meet nutritional needs   NEXT VISIT: June 3 during infusion  Winnell Bento B. Zenia Resides, [place], [place] Registered  Dietitian [phone] (pager)

## 2018-03-13 NOTE — Assessment & Plan Note (Addendum)
#  Right upper lobe lung cancer-squamous cell-stage III/ unresectable; new.  # Proceed with carbo-taxol with RT.  Labs today reviewed;  acceptable for treatment today.   #COPD/worse-recommend adding Advair.  New prescription given.  # headaches- MRI brain neg; question etiology.  Recommend NSAID use.  # weight loss/ apetite-discussed regarding protein intake referral Jolie.   # follow up in 5/28-cabo-taxol; follow-up with me in 2 weeks/labs chemotherapy

## 2018-03-13 NOTE — Progress Notes (Signed)
  Oncology Nurse Navigator Documentation  Navigator Location: [facility]-Med Onc (03/13/18 1000)   )Navigator Encounter Type: Treatment (03/13/18 1000)                   Treatment Initiated Date: 03/13/18 (03/13/18 1000) Patient Visit Type: MedOnc (03/13/18 1000) Treatment Phase: First Chemo Tx (03/13/18 1000) Barriers/Navigation Needs: No barriers at this time (03/13/18 1000)   Interventions: None required (03/13/18 1000)           met with patient prior to receiving first chemo treatment today. All questions answered at the time of visit. Reviewed with pt supportive services available and who to call if develops side effects from treatment. Pt states is nervous about starting chemo today. Reassurance provided. Reviewed upcoming appts with patient. Informed that will meet with dietitian today while in infusion. Pt verbalized understanding. Nothing further needed at this time.           Time Spent with Patient: 60 (03/13/18 1000)

## 2018-03-14 DIAGNOSIS — C3411 Malignant neoplasm of upper lobe, right bronchus or lung: Secondary | ICD-10-CM | POA: Diagnosis not present

## 2018-03-14 DIAGNOSIS — Z853 Personal history of malignant neoplasm of breast: Secondary | ICD-10-CM | POA: Diagnosis not present

## 2018-03-14 DIAGNOSIS — Z510 Encounter for antineoplastic radiation therapy: Secondary | ICD-10-CM | POA: Diagnosis not present

## 2018-03-14 DIAGNOSIS — Z87891 Personal history of nicotine dependence: Secondary | ICD-10-CM | POA: Diagnosis not present

## 2018-03-14 LAB — CULTURE, FUNGUS WITHOUT SMEAR

## 2018-03-21 ENCOUNTER — Ambulatory Visit
Admission: RE | Admit: 2018-03-21 | Discharge: 2018-03-21 | Disposition: A | Discharge status: Home / Self Care | Payer: Medicare Other | Source: Ambulatory Visit | Attending: Radiation Oncology | Admitting: Radiation Oncology

## 2018-03-21 ENCOUNTER — Inpatient Hospital Stay: Payer: Medicare Other

## 2018-03-21 DIAGNOSIS — J90 Pleural effusion, not elsewhere classified: Secondary | ICD-10-CM | POA: Diagnosis not present

## 2018-03-21 DIAGNOSIS — Z5111 Encounter for antineoplastic chemotherapy: Secondary | ICD-10-CM | POA: Diagnosis not present

## 2018-03-21 DIAGNOSIS — C3411 Malignant neoplasm of upper lobe, right bronchus or lung: Secondary | ICD-10-CM | POA: Diagnosis not present

## 2018-03-21 DIAGNOSIS — J449 Chronic obstructive pulmonary disease, unspecified: Secondary | ICD-10-CM | POA: Diagnosis not present

## 2018-03-21 DIAGNOSIS — R51 Headache: Secondary | ICD-10-CM | POA: Diagnosis not present

## 2018-03-21 DIAGNOSIS — R630 Anorexia: Secondary | ICD-10-CM | POA: Diagnosis not present

## 2018-03-21 LAB — CBC WITH DIFFERENTIAL/PLATELET
Basophils Absolute: 0.1 10*3/uL (ref 0–0.1)
Basophils Relative: 1 %
EOS ABS: 0.3 10*3/uL (ref 0–0.7)
EOS PCT: 4 %
HCT: 31.3 % — ABNORMAL LOW (ref 35.0–47.0)
Hemoglobin: 10.3 g/dL — ABNORMAL LOW (ref 12.0–16.0)
LYMPHS ABS: 0.8 10*3/uL — AB (ref 1.0–3.6)
LYMPHS PCT: 10 %
MCH: 27.9 pg (ref 26.0–34.0)
MCHC: 33 g/dL (ref 32.0–36.0)
MCV: 84.4 fL (ref 80.0–100.0)
MONO ABS: 0.4 10*3/uL (ref 0.2–0.9)
MONOS PCT: 6 %
Neutro Abs: 6.4 10*3/uL (ref 1.4–6.5)
Neutrophils Relative %: 79 %
PLATELETS: 322 10*3/uL (ref 150–440)
RBC: 3.71 MIL/uL — ABNORMAL LOW (ref 3.80–5.20)
RDW: 16.7 % — AB (ref 11.5–14.5)
WBC: 8.1 10*3/uL (ref 3.6–11.0)

## 2018-03-21 LAB — BASIC METABOLIC PANEL
Anion gap: 7 (ref 5–15)
BUN: 23 mg/dL — AB (ref 6–20)
CHLORIDE: 108 mmol/L (ref 101–111)
CO2: 25 mmol/L (ref 22–32)
CREATININE: 1.29 mg/dL — AB (ref 0.44–1.00)
Calcium: 9.8 mg/dL (ref 8.9–10.3)
GFR calc Af Amer: 44 mL/min — ABNORMAL LOW (ref >60)
GFR calc non Af Amer: 38 mL/min — ABNORMAL LOW (ref >60)
GLUCOSE: 117 mg/dL — AB (ref 65–99)
POTASSIUM: 4 mmol/L (ref 3.5–5.1)
SODIUM: 140 mmol/L (ref 135–145)

## 2018-03-21 MED ORDER — PALONOSETRON HCL INJECTION 0.25 MG/5ML
0.2500 mg | Freq: Once | INTRAVENOUS | Status: AC
  Administered 2018-03-21: 0.25 mg via INTRAVENOUS
  Filled 2018-03-21: qty 5

## 2018-03-21 MED ORDER — DIPHENHYDRAMINE HCL 50 MG/ML IJ SOLN
25.0000 mg | Freq: Once | INTRAMUSCULAR | Status: AC
  Administered 2018-03-21: 25 mg via INTRAVENOUS
  Filled 2018-03-21: qty 1

## 2018-03-21 MED ORDER — SODIUM CHLORIDE 0.9 % IV SOLN
20.0000 mg | Freq: Once | INTRAVENOUS | Status: AC
  Administered 2018-03-21: 20 mg via INTRAVENOUS
  Filled 2018-03-21: qty 2

## 2018-03-21 MED ORDER — SODIUM CHLORIDE 0.9 % IV SOLN
Freq: Once | INTRAVENOUS | Status: AC
  Administered 2018-03-21: 10:00:00 via INTRAVENOUS
  Filled 2018-03-21: qty 1000

## 2018-03-21 MED ORDER — HEPARIN SOD (PORK) LOCK FLUSH 100 UNIT/ML IV SOLN
500.0000 [IU] | Freq: Once | INTRAVENOUS | Status: AC
  Administered 2018-03-21: 500 [IU] via INTRAVENOUS
  Filled 2018-03-21: qty 5

## 2018-03-21 MED ORDER — SODIUM CHLORIDE 0.9% FLUSH
10.0000 mL | INTRAVENOUS | Status: DC | PRN
  Administered 2018-03-21: 10 mL via INTRAVENOUS
  Filled 2018-03-21: qty 10

## 2018-03-21 MED ORDER — SODIUM CHLORIDE 0.9 % IV SOLN
130.0000 mg | Freq: Once | INTRAVENOUS | Status: AC
  Administered 2018-03-21: 130 mg via INTRAVENOUS
  Filled 2018-03-21: qty 13

## 2018-03-21 MED ORDER — FAMOTIDINE IN NACL 20-0.9 MG/50ML-% IV SOLN
20.0000 mg | Freq: Once | INTRAVENOUS | Status: AC
  Administered 2018-03-21: 20 mg via INTRAVENOUS
  Filled 2018-03-21: qty 50

## 2018-03-21 MED ORDER — SODIUM CHLORIDE 0.9 % IV SOLN
45.0000 mg/m2 | Freq: Once | INTRAVENOUS | Status: AC
  Administered 2018-03-21: 78 mg via INTRAVENOUS
  Filled 2018-03-21: qty 13

## 2018-03-22 ENCOUNTER — Ambulatory Visit
Admission: RE | Admit: 2018-03-22 | Discharge: 2018-03-22 | Disposition: A | Discharge status: Home / Self Care | Payer: Medicare Other | Source: Ambulatory Visit | Attending: Radiation Oncology | Admitting: Radiation Oncology

## 2018-03-22 DIAGNOSIS — Z87891 Personal history of nicotine dependence: Secondary | ICD-10-CM | POA: Diagnosis not present

## 2018-03-22 DIAGNOSIS — Z853 Personal history of malignant neoplasm of breast: Secondary | ICD-10-CM | POA: Diagnosis not present

## 2018-03-22 DIAGNOSIS — Z510 Encounter for antineoplastic radiation therapy: Secondary | ICD-10-CM | POA: Diagnosis not present

## 2018-03-22 DIAGNOSIS — C3411 Malignant neoplasm of upper lobe, right bronchus or lung: Secondary | ICD-10-CM | POA: Diagnosis not present

## 2018-03-23 ENCOUNTER — Ambulatory Visit
Admission: RE | Admit: 2018-03-23 | Discharge: 2018-03-23 | Disposition: A | Discharge status: Home / Self Care | Payer: Medicare Other | Source: Ambulatory Visit | Attending: Radiation Oncology | Admitting: Radiation Oncology

## 2018-03-23 DIAGNOSIS — C3411 Malignant neoplasm of upper lobe, right bronchus or lung: Secondary | ICD-10-CM | POA: Diagnosis not present

## 2018-03-23 DIAGNOSIS — Z853 Personal history of malignant neoplasm of breast: Secondary | ICD-10-CM | POA: Diagnosis not present

## 2018-03-23 DIAGNOSIS — Z87891 Personal history of nicotine dependence: Secondary | ICD-10-CM | POA: Diagnosis not present

## 2018-03-23 DIAGNOSIS — Z510 Encounter for antineoplastic radiation therapy: Secondary | ICD-10-CM | POA: Diagnosis not present

## 2018-03-24 ENCOUNTER — Encounter: Payer: Self-pay | Admitting: Internal Medicine

## 2018-03-24 ENCOUNTER — Ambulatory Visit
Admission: RE | Admit: 2018-03-24 | Discharge: 2018-03-24 | Disposition: A | Discharge status: Home / Self Care | Payer: Medicare Other | Source: Ambulatory Visit | Attending: Radiation Oncology | Admitting: Radiation Oncology

## 2018-03-24 DIAGNOSIS — C3411 Malignant neoplasm of upper lobe, right bronchus or lung: Secondary | ICD-10-CM | POA: Diagnosis not present

## 2018-03-24 DIAGNOSIS — Z87891 Personal history of nicotine dependence: Secondary | ICD-10-CM | POA: Diagnosis not present

## 2018-03-24 DIAGNOSIS — Z853 Personal history of malignant neoplasm of breast: Secondary | ICD-10-CM | POA: Diagnosis not present

## 2018-03-24 DIAGNOSIS — Z510 Encounter for antineoplastic radiation therapy: Secondary | ICD-10-CM | POA: Diagnosis not present

## 2018-03-27 ENCOUNTER — Inpatient Hospital Stay (HOSPITAL_BASED_OUTPATIENT_CLINIC_OR_DEPARTMENT_OTHER): Payer: Medicare Other | Admitting: Internal Medicine

## 2018-03-27 ENCOUNTER — Encounter: Payer: Self-pay | Admitting: Internal Medicine

## 2018-03-27 ENCOUNTER — Inpatient Hospital Stay: Payer: Medicare Other

## 2018-03-27 ENCOUNTER — Ambulatory Visit
Admission: RE | Admit: 2018-03-27 | Discharge: 2018-03-27 | Disposition: A | Discharge status: Home / Self Care | Payer: Medicare Other | Source: Ambulatory Visit | Attending: Radiation Oncology | Admitting: Radiation Oncology

## 2018-03-27 ENCOUNTER — Inpatient Hospital Stay: Payer: Medicare Other | Attending: Internal Medicine

## 2018-03-27 ENCOUNTER — Other Ambulatory Visit: Payer: Self-pay

## 2018-03-27 VITALS — BP 119/74 | HR 84 | Resp 18

## 2018-03-27 VITALS — BP 137/79 | HR 88 | Temp 97.6°F | Resp 16 | Ht 64.0 in | Wt 143.6 lb

## 2018-03-27 DIAGNOSIS — R110 Nausea: Secondary | ICD-10-CM | POA: Diagnosis not present

## 2018-03-27 DIAGNOSIS — Z853 Personal history of malignant neoplasm of breast: Secondary | ICD-10-CM | POA: Insufficient documentation

## 2018-03-27 DIAGNOSIS — I10 Essential (primary) hypertension: Secondary | ICD-10-CM | POA: Diagnosis not present

## 2018-03-27 DIAGNOSIS — C3411 Malignant neoplasm of upper lobe, right bronchus or lung: Secondary | ICD-10-CM | POA: Insufficient documentation

## 2018-03-27 DIAGNOSIS — Z87891 Personal history of nicotine dependence: Secondary | ICD-10-CM | POA: Insufficient documentation

## 2018-03-27 DIAGNOSIS — T451X5S Adverse effect of antineoplastic and immunosuppressive drugs, sequela: Secondary | ICD-10-CM | POA: Diagnosis not present

## 2018-03-27 DIAGNOSIS — H409 Unspecified glaucoma: Secondary | ICD-10-CM | POA: Insufficient documentation

## 2018-03-27 DIAGNOSIS — R5383 Other fatigue: Secondary | ICD-10-CM | POA: Diagnosis not present

## 2018-03-27 DIAGNOSIS — K7689 Other specified diseases of liver: Secondary | ICD-10-CM | POA: Insufficient documentation

## 2018-03-27 DIAGNOSIS — R634 Abnormal weight loss: Secondary | ICD-10-CM | POA: Insufficient documentation

## 2018-03-27 DIAGNOSIS — R042 Hemoptysis: Secondary | ICD-10-CM | POA: Insufficient documentation

## 2018-03-27 DIAGNOSIS — Z79899 Other long term (current) drug therapy: Secondary | ICD-10-CM | POA: Insufficient documentation

## 2018-03-27 DIAGNOSIS — R51 Headache: Secondary | ICD-10-CM | POA: Diagnosis not present

## 2018-03-27 DIAGNOSIS — J90 Pleural effusion, not elsewhere classified: Secondary | ICD-10-CM | POA: Diagnosis not present

## 2018-03-27 DIAGNOSIS — R630 Anorexia: Secondary | ICD-10-CM | POA: Diagnosis not present

## 2018-03-27 DIAGNOSIS — Z5111 Encounter for antineoplastic chemotherapy: Secondary | ICD-10-CM | POA: Diagnosis not present

## 2018-03-27 DIAGNOSIS — J449 Chronic obstructive pulmonary disease, unspecified: Secondary | ICD-10-CM | POA: Diagnosis not present

## 2018-03-27 DIAGNOSIS — Z510 Encounter for antineoplastic radiation therapy: Secondary | ICD-10-CM | POA: Insufficient documentation

## 2018-03-27 LAB — COMPREHENSIVE METABOLIC PANEL
ALBUMIN: 3.6 g/dL (ref 3.5–5.0)
ALT: 15 U/L (ref 14–54)
ANION GAP: 6 (ref 5–15)
AST: 17 U/L (ref 15–41)
Alkaline Phosphatase: 65 U/L (ref 38–126)
BUN: 18 mg/dL (ref 6–20)
CHLORIDE: 109 mmol/L (ref 101–111)
CO2: 24 mmol/L (ref 22–32)
Calcium: 9.6 mg/dL (ref 8.9–10.3)
Creatinine, Ser: 1.09 mg/dL — ABNORMAL HIGH (ref 0.44–1.00)
GFR calc Af Amer: 54 mL/min — ABNORMAL LOW (ref >60)
GFR calc non Af Amer: 47 mL/min — ABNORMAL LOW (ref >60)
GLUCOSE: 104 mg/dL — AB (ref 65–99)
Potassium: 4.7 mmol/L (ref 3.5–5.1)
SODIUM: 139 mmol/L (ref 135–145)
Total Bilirubin: 0.3 mg/dL (ref 0.3–1.2)
Total Protein: 7.2 g/dL (ref 6.5–8.1)

## 2018-03-27 LAB — CBC WITH DIFFERENTIAL/PLATELET
Basophils Absolute: 0.1 10*3/uL (ref 0–0.1)
Basophils Relative: 1 %
Eosinophils Absolute: 0.2 10*3/uL (ref 0–0.7)
Eosinophils Relative: 3 %
HEMATOCRIT: 32.7 % — AB (ref 35.0–47.0)
HEMOGLOBIN: 10.7 g/dL — AB (ref 12.0–16.0)
LYMPHS ABS: 0.8 10*3/uL — AB (ref 1.0–3.6)
Lymphocytes Relative: 13 %
MCH: 27.9 pg (ref 26.0–34.0)
MCHC: 32.7 g/dL (ref 32.0–36.0)
MCV: 85.5 fL (ref 80.0–100.0)
MONOS PCT: 5 %
Monocytes Absolute: 0.3 10*3/uL (ref 0.2–0.9)
NEUTROS ABS: 4.8 10*3/uL (ref 1.4–6.5)
Neutrophils Relative %: 78 %
Platelets: 335 10*3/uL (ref 150–440)
RBC: 3.83 MIL/uL (ref 3.80–5.20)
RDW: 16.4 % — ABNORMAL HIGH (ref 11.5–14.5)
WBC: 6.2 10*3/uL (ref 3.6–11.0)

## 2018-03-27 MED ORDER — PALONOSETRON HCL INJECTION 0.25 MG/5ML
0.2500 mg | Freq: Once | INTRAVENOUS | Status: AC
  Administered 2018-03-27: 0.25 mg via INTRAVENOUS
  Filled 2018-03-27: qty 5

## 2018-03-27 MED ORDER — HEPARIN SOD (PORK) LOCK FLUSH 100 UNIT/ML IV SOLN
500.0000 [IU] | Freq: Once | INTRAVENOUS | Status: DC | PRN
  Filled 2018-03-27: qty 5

## 2018-03-27 MED ORDER — SODIUM CHLORIDE 0.9% FLUSH
10.0000 mL | INTRAVENOUS | Status: DC | PRN
  Administered 2018-03-27: 10 mL via INTRAVENOUS
  Filled 2018-03-27: qty 10

## 2018-03-27 MED ORDER — FAMOTIDINE IN NACL 20-0.9 MG/50ML-% IV SOLN
20.0000 mg | Freq: Once | INTRAVENOUS | Status: AC
  Administered 2018-03-27: 20 mg via INTRAVENOUS
  Filled 2018-03-27: qty 50

## 2018-03-27 MED ORDER — SODIUM CHLORIDE 0.9 % IV SOLN
45.0000 mg/m2 | Freq: Once | INTRAVENOUS | Status: AC
  Administered 2018-03-27: 78 mg via INTRAVENOUS
  Filled 2018-03-27: qty 13

## 2018-03-27 MED ORDER — SODIUM CHLORIDE 0.9 % IV SOLN
20.0000 mg | Freq: Once | INTRAVENOUS | Status: AC
  Administered 2018-03-27: 20 mg via INTRAVENOUS
  Filled 2018-03-27: qty 2

## 2018-03-27 MED ORDER — SODIUM CHLORIDE 0.9 % IV SOLN
Freq: Once | INTRAVENOUS | Status: AC
  Administered 2018-03-27: 11:00:00 via INTRAVENOUS
  Filled 2018-03-27: qty 1000

## 2018-03-27 MED ORDER — DIPHENHYDRAMINE HCL 50 MG/ML IJ SOLN
25.0000 mg | Freq: Once | INTRAMUSCULAR | Status: AC
  Administered 2018-03-27: 25 mg via INTRAVENOUS
  Filled 2018-03-27: qty 1

## 2018-03-27 MED ORDER — HEPARIN SOD (PORK) LOCK FLUSH 100 UNIT/ML IV SOLN
500.0000 [IU] | Freq: Once | INTRAVENOUS | Status: AC
  Administered 2018-03-27: 500 [IU] via INTRAVENOUS

## 2018-03-27 MED ORDER — SODIUM CHLORIDE 0.9 % IV SOLN
130.0000 mg | Freq: Once | INTRAVENOUS | Status: AC
  Administered 2018-03-27: 130 mg via INTRAVENOUS
  Filled 2018-03-27: qty 13

## 2018-03-27 NOTE — Progress Notes (Signed)
Patient here for pretreatment check. She reports that she has been feeling lightheaded since her last treatment.Lab unable to draw from port. Flushed fine.

## 2018-03-27 NOTE — Progress Notes (Signed)
Nutrition Follow-up:  Patient with lung cancer followed by Dr. Jacinto Reap.  Met with patient during infusion today.  Patient reports appetite is about the same.  Reports that she has been trying to eat protein foods.  Reports that she tried ensure/boost shakes and reports they are really thick.    No report of nutrition impact symptoms.  Medications: reviewed  Labs: reviewed  Anthropometrics:   Weight 143 lb 9.6 oz increased from 142 lb 9.6 oz.    UBW 190 lb (about 6 months ago)   NUTRITION DIAGNOSIS: Inadequate oral intake improving   INTERVENTION:  Reviewed good sources of protein with patient. Discussed strategies to help with thickness of ensure/boost shakes.      MONITORING, EVALUATION, GOAL: Patient will consume adequate calories and protein to meet nutritional needs   NEXT VISIT: June 17 during infusion  Brenda Parker B. Zenia Resides, [place], [place] Registered Dietitian [phone] (pager)

## 2018-03-27 NOTE — Assessment & Plan Note (Addendum)
#  Right upper lobe lung cancer-squamous cell-stage III/ unresectable; on carboplatin Taxol with radiation.  Stable  #Continue carbotaxol with radiation; Labs today reviewed;  acceptable for treatment today.  Discussed that in general would recommend repeat imaging after finishing chemoradiation sometime in end of July/August.   #COPD-worse; recommend compliance with Advair albuterol.  #Weight loss appetite-stable/improved.  # Fatigue-worsened ; secondary to COPD/chemoradiation.  Monitor closely.  # lab-taxol in 1 week; in 2 week/cbc/cmp/X-MD; taxol chemo

## 2018-03-27 NOTE — Progress Notes (Signed)
[place] OFFICE PROGRESS NOTE  Patient Care Team: Leonel Ramsay, MD as PCP - General (Infectious Diseases) Telford Nab, RN as Registered Nurse  Cancer Staging No matching staging information was found for the patient.   Oncology History   # RUL LUNG CANCER-non-small cell; favor squamous cell; PET scan T4N0 [right upper lobe mass involving[mediastinal/blood results] vs small pleural effusion [?M1]   # Hx of Right breast ca [s/p lumpec; RT; No chemo; "pill" x5 years]  # COPD/Hx of smoking  MOLECULAR TESTING/OMNISEQ: PDL-1 25% [TPS; TMB-H; No targets**]  --------------------------------------------------    DIAGNOSIS: [ April 2019]SQUAMOUS CELL LUNG CA  STAGE: III ;GOALS: CURATIVE  CURRENT/MOST RECENT THERAPY [ May 2019]- Carbo-taxol with RT .       Primary cancer of right upper lobe of lung ([place])   03/06/2018 -  Chemotherapy    The patient had palonosetron (ALOXI) injection 0.25 mg, 0.25 mg, Intravenous,  Once, 3 of 8 cycles Administration: 0.25 mg (03/13/2018), 0.25 mg (03/21/2018), 0.25 mg (03/27/2018) CARBOplatin (PARAPLATIN) 130 mg in sodium chloride 0.9 % 250 mL chemo infusion, 130 mg (100 % of original dose 131.4 mg), Intravenous,  Once, 3 of 8 cycles Dose modification:   (original dose 131.4 mg, Cycle 1) Administration: 130 mg (03/13/2018), 130 mg (03/21/2018), 130 mg (03/27/2018) PACLitaxel (TAXOL) 78 mg in sodium chloride 0.9 % 250 mL chemo infusion (</= 74m/m2), 45 mg/m2 = 78 mg, Intravenous,  Once, 3 of 8 cycles Administration: 78 mg (03/13/2018), 78 mg (03/21/2018), 78 mg (03/27/2018)  for chemotherapy treatment.          INTERVAL HISTORY:  Brenda [id number].o.  female pleasant patient above history of stage III squamous cell lung cancer currently on chemoradiation is here for follow-up.  Patient continues to complain of moderate fatigue.  Complains of cough and shortness of breath.  Unfortunately not using inhalers as recommended.  Review  of Systems  Constitutional: Positive for malaise/fatigue. Negative for chills, diaphoresis, fever and weight loss.  HENT: Negative for nosebleeds and sore throat.   Eyes: Negative for double vision.  Respiratory: Positive for cough and shortness of breath. Negative for hemoptysis, sputum production and wheezing.   Cardiovascular: Negative for chest pain, palpitations, orthopnea and leg swelling.  Gastrointestinal: Negative for abdominal pain, blood in stool, constipation, diarrhea, heartburn, melena, nausea and vomiting.  Genitourinary: Negative for dysuria, frequency and urgency.  Musculoskeletal: Negative for back pain and joint pain.  Skin: Negative.  Negative for itching and rash.  Neurological: Negative for dizziness, tingling, focal weakness, weakness and headaches.  Endo/Heme/Allergies: Does not bruise/bleed easily.  Psychiatric/Behavioral: Negative for depression. The patient is nervous/anxious. The patient does not have insomnia.       PAST MEDICAL HISTORY :  Past Medical History:  Diagnosis Date  . Benign liver cyst   . [place] cancer [place]   right breast  . [place]   . Dyspnea   . Glaucoma   . Hemoptysis 2019  . Hypertension    Not on medication for htn. patient denies this  . Lung mass 01/2018  . Personal history of radiation therapy     PAST SURGICAL HISTORY :   Past Surgical History:  Procedure Laterality Date  . BREAST EXCISIONAL BIOPSY Right 2005   positive, radiation  . CHOLECYSTECTOMY  1988  . COLONOSCOPY  2012  . ENDOBRONCHIAL ULTRASOUND N/A 02/21/2018   Procedure: ENDOBRONCHIAL ULTRASOUND;  Surgeon: RLaverle Hobby MD;  Location: [facility] ORS;  Service: Pulmonary;  Laterality: N/A;  . EYE SURGERY Bilateral 2009,2010   cataract extractions with lens implant  . GALLBLADDER SURGERY Bilateral   . IR FLUORO GUIDE PORT INSERTION RIGHT  03/10/2018  . JOINT REPLACEMENT Left 2008   left hip replacement  .  PORTACATH PLACEMENT  03/10/2018  . TONSILLECTOMY  1960    FAMILY HISTORY :   Family History  Problem Relation Age of Onset  . Breast cancer Mother 70    SOCIAL HISTORY:   Social History   Tobacco Use  . Smoking status: Former Smoker    Packs/day: 1.00    Types: Cigarettes    Last attempt to quit: 10/25/2005    Years since quitting: 12.4  . Smokeless tobacco: Never Used  Substance Use Topics  . Alcohol use: Not Currently    Comment: occasional glass of beer  . Drug use: Never    ALLERGIES:  is allergic to penicillins.  MEDICATIONS:  Current Outpatient Medications  Medication Sig Dispense Refill  . albuterol (PROVENTIL HFA) 108 (90 Base) MCG/ACT inhaler Inhale 2 puffs into the lungs every 6 (six) hours as needed for wheezing or shortness of breath.     Marland Kitchen aspirin EC 81 MG tablet Take 81 mg by mouth daily.    . Cholecalciferol (VITAMIN D3) 3000 units TABS Take 3,000 Units by mouth daily.     . Fluticasone-Salmeterol (ADVAIR DISKUS) 500-50 MCG/DOSE AEPB Inhale 1 puff into the lungs 2 (two) times daily. 1 each 3  . latanoprost (XALATAN) 0.005 % ophthalmic solution Place 1 drop into both eyes at bedtime.    . lidocaine-prilocaine (EMLA) cream Apply 1 application topically as needed. To port a cath site 30 g 6  . ondansetron (ZOFRAN) 8 MG tablet Take 1 tablet (8 mg total) by mouth every 8 (eight) hours as needed for nausea or vomiting. 20 tablet 3  . prochlorperazine (COMPAZINE) 10 MG tablet Take 1 tablet (10 mg total) by mouth every 6 (six) hours as needed for nausea or vomiting. 30 tablet 3   No current facility-administered medications for this visit.     PHYSICAL EXAMINATION: ECOG PERFORMANCE STATUS: 1 - Symptomatic but completely ambulatory  BP 137/79 (BP Location: Right Arm, Patient Position: Sitting)   Pulse 88   Temp 97.6 F (36.4 C) (Tympanic)   Resp 16   Ht 5' 4"  (1.626 m)   Wt 143 lb 9.6 oz (65.1 kg)   SpO2 97%   BMI 24.65 kg/m   Filed Weights   03/27/18  0919  Weight: 143 lb 9.6 oz (65.1 kg)    GENERAL: Well-nourished well-developed; Alert, no distress and comfortable.  Accompanied by her son  EYES: no pallor or icterus OROPHARYNX: no thrush or ulceration; NECK: supple; no lymph nodes felt. LYMPH:  no palpable lymphadenopathy in the axillary or inguinal regions LUNGS: Decreased breath sounds auscultation bilaterally. No wheeze or crackles HEART/CVS: regular rate & rhythm and no murmurs; No lower extremity edema ABDOMEN:abdomen soft, non-tender and normal bowel sounds. No hepatomegaly or splenomegaly.  Musculoskeletal:no cyanosis of digits and no clubbing  PSYCH: alert & oriented x 3 with fluent speech NEURO: no focal motor/sensory deficits SKIN:  no rashes or significant lesions    LABORATORY DATA:  I have reviewed the data as listed    Component Value Date/Time   NA 139 03/27/2018 0854   K 4.7 03/27/2018 0854   CL 109 03/27/2018 0854   CO2 24 03/27/2018 0854   GLUCOSE 104 (H) 03/27/2018 5374  BUN 18 03/27/2018 0854   CREATININE 1.09 (H) 03/27/2018 0854   CALCIUM 9.6 03/27/2018 0854   PROT 7.2 03/27/2018 0854   ALBUMIN 3.6 03/27/2018 0854   AST 17 03/27/2018 0854   ALT 15 03/27/2018 0854   ALKPHOS 65 03/27/2018 0854   BILITOT 0.3 03/27/2018 0854   GFRNONAA 47 (L) 03/27/2018 0854   GFRAA 54 (L) 03/27/2018 0854    No results found for: SPEP, UPEP  Lab Results  Component Value Date   WBC 6.2 03/27/2018   NEUTROABS 4.8 03/27/2018   HGB 10.7 (L) 03/27/2018   HCT 32.7 (L) 03/27/2018   MCV 85.5 03/27/2018   PLT 335 03/27/2018      Chemistry      Component Value Date/Time   NA 139 03/27/2018 0854   K 4.7 03/27/2018 0854   CL 109 03/27/2018 0854   CO2 24 03/27/2018 0854   BUN 18 03/27/2018 0854   CREATININE 1.09 (H) 03/27/2018 0854      Component Value Date/Time   CALCIUM 9.6 03/27/2018 0854   ALKPHOS 65 03/27/2018 0854   AST 17 03/27/2018 0854   ALT 15 03/27/2018 0854   BILITOT 0.3 03/27/2018 0854        RADIOGRAPHIC STUDIES: I have personally reviewed the radiological images as listed and agreed with the findings in the report. No results found.   ASSESSMENT & PLAN:  Primary cancer of right upper lobe of lung ([place]) #Right upper lobe lung cancer-squamous cell-stage III/ unresectable; on carboplatin Taxol with radiation.  Stable  #Continue carbotaxol with radiation; Labs today reviewed;  acceptable for treatment today.  Discussed that in general would recommend repeat imaging after finishing chemoradiation sometime in end of July/August.   #COPD-worse; recommend compliance with Advair albuterol.  #Weight loss appetite-stable/improved.  # Fatigue-worsened ; secondary to COPD/chemoradiation.  Monitor closely.  # lab-taxol in 1 week; in 2 week/cbc/cmp/X-MD; taxol chemo     Orders Placed This Encounter  Procedures  . Basic metabolic panel    Standing Status:   Future    Standing Expiration Date:   03/27/2019  . Comprehensive metabolic panel    Standing Status:   Future    Standing Expiration Date:   03/27/2019   All questions were answered. The patient knows to call the clinic with any problems, questions or concerns.      [name], MD 03/27/2018 10:50 PM

## 2018-03-28 ENCOUNTER — Ambulatory Visit
Admission: RE | Admit: 2018-03-28 | Discharge: 2018-03-28 | Disposition: A | Discharge status: Home / Self Care | Payer: Medicare Other | Source: Ambulatory Visit | Attending: Radiation Oncology | Admitting: Radiation Oncology

## 2018-03-28 DIAGNOSIS — Z87891 Personal history of nicotine dependence: Secondary | ICD-10-CM | POA: Diagnosis not present

## 2018-03-28 DIAGNOSIS — C3411 Malignant neoplasm of upper lobe, right bronchus or lung: Secondary | ICD-10-CM | POA: Diagnosis not present

## 2018-03-28 DIAGNOSIS — Z510 Encounter for antineoplastic radiation therapy: Secondary | ICD-10-CM | POA: Diagnosis not present

## 2018-03-28 DIAGNOSIS — K7689 Other specified diseases of liver: Secondary | ICD-10-CM | POA: Diagnosis not present

## 2018-03-28 DIAGNOSIS — Z853 Personal history of malignant neoplasm of breast: Secondary | ICD-10-CM | POA: Diagnosis not present

## 2018-03-28 DIAGNOSIS — J449 Chronic obstructive pulmonary disease, unspecified: Secondary | ICD-10-CM | POA: Diagnosis not present

## 2018-03-28 DIAGNOSIS — I10 Essential (primary) hypertension: Secondary | ICD-10-CM | POA: Diagnosis not present

## 2018-03-29 ENCOUNTER — Ambulatory Visit
Admission: RE | Admit: 2018-03-29 | Discharge: 2018-03-29 | Disposition: A | Discharge status: Home / Self Care | Payer: Medicare Other | Source: Ambulatory Visit | Attending: Radiation Oncology | Admitting: Radiation Oncology

## 2018-03-29 DIAGNOSIS — I10 Essential (primary) hypertension: Secondary | ICD-10-CM | POA: Diagnosis not present

## 2018-03-29 DIAGNOSIS — Z510 Encounter for antineoplastic radiation therapy: Secondary | ICD-10-CM | POA: Diagnosis not present

## 2018-03-29 DIAGNOSIS — Z87891 Personal history of nicotine dependence: Secondary | ICD-10-CM | POA: Diagnosis not present

## 2018-03-29 DIAGNOSIS — J449 Chronic obstructive pulmonary disease, unspecified: Secondary | ICD-10-CM | POA: Diagnosis not present

## 2018-03-29 DIAGNOSIS — C3411 Malignant neoplasm of upper lobe, right bronchus or lung: Secondary | ICD-10-CM | POA: Diagnosis not present

## 2018-03-29 DIAGNOSIS — K7689 Other specified diseases of liver: Secondary | ICD-10-CM | POA: Diagnosis not present

## 2018-03-29 DIAGNOSIS — Z853 Personal history of malignant neoplasm of breast: Secondary | ICD-10-CM | POA: Diagnosis not present

## 2018-03-30 ENCOUNTER — Ambulatory Visit
Admission: RE | Admit: 2018-03-30 | Discharge: 2018-03-30 | Disposition: A | Discharge status: Home / Self Care | Payer: Medicare Other | Source: Ambulatory Visit | Attending: Radiation Oncology | Admitting: Radiation Oncology

## 2018-03-30 DIAGNOSIS — C3411 Malignant neoplasm of upper lobe, right bronchus or lung: Secondary | ICD-10-CM | POA: Diagnosis not present

## 2018-03-30 DIAGNOSIS — J449 Chronic obstructive pulmonary disease, unspecified: Secondary | ICD-10-CM | POA: Diagnosis not present

## 2018-03-30 DIAGNOSIS — Z510 Encounter for antineoplastic radiation therapy: Secondary | ICD-10-CM | POA: Diagnosis not present

## 2018-03-30 DIAGNOSIS — Z87891 Personal history of nicotine dependence: Secondary | ICD-10-CM | POA: Diagnosis not present

## 2018-03-30 DIAGNOSIS — Z853 Personal history of malignant neoplasm of breast: Secondary | ICD-10-CM | POA: Diagnosis not present

## 2018-03-30 DIAGNOSIS — K7689 Other specified diseases of liver: Secondary | ICD-10-CM | POA: Diagnosis not present

## 2018-03-30 DIAGNOSIS — I10 Essential (primary) hypertension: Secondary | ICD-10-CM | POA: Diagnosis not present

## 2018-03-31 ENCOUNTER — Ambulatory Visit
Admission: RE | Admit: 2018-03-31 | Discharge: 2018-03-31 | Disposition: A | Discharge status: Home / Self Care | Payer: Medicare Other | Source: Ambulatory Visit | Attending: Radiation Oncology | Admitting: Radiation Oncology

## 2018-03-31 DIAGNOSIS — J449 Chronic obstructive pulmonary disease, unspecified: Secondary | ICD-10-CM | POA: Diagnosis not present

## 2018-03-31 DIAGNOSIS — Z87891 Personal history of nicotine dependence: Secondary | ICD-10-CM | POA: Diagnosis not present

## 2018-03-31 DIAGNOSIS — Z853 Personal history of malignant neoplasm of breast: Secondary | ICD-10-CM | POA: Diagnosis not present

## 2018-03-31 DIAGNOSIS — I10 Essential (primary) hypertension: Secondary | ICD-10-CM | POA: Diagnosis not present

## 2018-03-31 DIAGNOSIS — Z510 Encounter for antineoplastic radiation therapy: Secondary | ICD-10-CM | POA: Diagnosis not present

## 2018-03-31 DIAGNOSIS — K7689 Other specified diseases of liver: Secondary | ICD-10-CM | POA: Diagnosis not present

## 2018-03-31 DIAGNOSIS — C3411 Malignant neoplasm of upper lobe, right bronchus or lung: Secondary | ICD-10-CM | POA: Diagnosis not present

## 2018-04-03 ENCOUNTER — Inpatient Hospital Stay: Payer: Medicare Other

## 2018-04-03 ENCOUNTER — Ambulatory Visit
Admission: RE | Admit: 2018-04-03 | Discharge: 2018-04-03 | Disposition: A | Discharge status: Home / Self Care | Payer: Medicare Other | Source: Ambulatory Visit | Attending: Radiation Oncology | Admitting: Radiation Oncology

## 2018-04-03 VITALS — BP 136/81 | HR 96 | Temp 96.0°F | Resp 18 | Wt 141.4 lb

## 2018-04-03 DIAGNOSIS — Z5111 Encounter for antineoplastic chemotherapy: Secondary | ICD-10-CM | POA: Diagnosis not present

## 2018-04-03 DIAGNOSIS — I10 Essential (primary) hypertension: Secondary | ICD-10-CM | POA: Diagnosis not present

## 2018-04-03 DIAGNOSIS — C3411 Malignant neoplasm of upper lobe, right bronchus or lung: Secondary | ICD-10-CM

## 2018-04-03 DIAGNOSIS — T451X5S Adverse effect of antineoplastic and immunosuppressive drugs, sequela: Secondary | ICD-10-CM | POA: Diagnosis not present

## 2018-04-03 DIAGNOSIS — Z510 Encounter for antineoplastic radiation therapy: Secondary | ICD-10-CM | POA: Diagnosis not present

## 2018-04-03 DIAGNOSIS — Z853 Personal history of malignant neoplasm of breast: Secondary | ICD-10-CM | POA: Diagnosis not present

## 2018-04-03 DIAGNOSIS — Z87891 Personal history of nicotine dependence: Secondary | ICD-10-CM | POA: Diagnosis not present

## 2018-04-03 DIAGNOSIS — J90 Pleural effusion, not elsewhere classified: Secondary | ICD-10-CM | POA: Diagnosis not present

## 2018-04-03 DIAGNOSIS — J449 Chronic obstructive pulmonary disease, unspecified: Secondary | ICD-10-CM | POA: Diagnosis not present

## 2018-04-03 DIAGNOSIS — R110 Nausea: Secondary | ICD-10-CM | POA: Diagnosis not present

## 2018-04-03 DIAGNOSIS — K7689 Other specified diseases of liver: Secondary | ICD-10-CM | POA: Diagnosis not present

## 2018-04-03 LAB — COMPREHENSIVE METABOLIC PANEL
ALBUMIN: 3.5 g/dL (ref 3.5–5.0)
ALT: 25 U/L (ref 14–54)
AST: 24 U/L (ref 15–41)
Alkaline Phosphatase: 71 U/L (ref 38–126)
Anion gap: 6 (ref 5–15)
BILIRUBIN TOTAL: 0.6 mg/dL (ref 0.3–1.2)
BUN: 17 mg/dL (ref 6–20)
CO2: 23 mmol/L (ref 22–32)
CREATININE: 1.18 mg/dL — AB (ref 0.44–1.00)
Calcium: 9.5 mg/dL (ref 8.9–10.3)
Chloride: 109 mmol/L (ref 101–111)
GFR calc Af Amer: 49 mL/min — ABNORMAL LOW (ref >60)
GFR, EST NON AFRICAN AMERICAN: 43 mL/min — AB (ref >60)
GLUCOSE: 102 mg/dL — AB (ref 65–99)
POTASSIUM: 4.1 mmol/L (ref 3.5–5.1)
Sodium: 138 mmol/L (ref 135–145)
TOTAL PROTEIN: 7.1 g/dL (ref 6.5–8.1)

## 2018-04-03 LAB — CBC WITH DIFFERENTIAL/PLATELET
BASOS ABS: 0.1 10*3/uL (ref 0–0.1)
BASOS PCT: 1 %
Eosinophils Absolute: 0.1 10*3/uL (ref 0–0.7)
Eosinophils Relative: 3 %
HEMATOCRIT: 32.1 % — AB (ref 35.0–47.0)
Hemoglobin: 10.8 g/dL — ABNORMAL LOW (ref 12.0–16.0)
LYMPHS PCT: 11 %
Lymphs Abs: 0.5 10*3/uL — ABNORMAL LOW (ref 1.0–3.6)
MCH: 28.6 pg (ref 26.0–34.0)
MCHC: 33.7 g/dL (ref 32.0–36.0)
MCV: 84.7 fL (ref 80.0–100.0)
Monocytes Absolute: 0.4 10*3/uL (ref 0.2–0.9)
Monocytes Relative: 7 %
NEUTROS PCT: 78 %
Neutro Abs: 4 10*3/uL (ref 1.4–6.5)
Platelets: 317 10*3/uL (ref 150–440)
RBC: 3.79 MIL/uL — AB (ref 3.80–5.20)
RDW: 17 % — ABNORMAL HIGH (ref 11.5–14.5)
WBC: 5.1 10*3/uL (ref 3.6–11.0)

## 2018-04-03 MED ORDER — SODIUM CHLORIDE 0.9 % IV SOLN
Freq: Once | INTRAVENOUS | Status: AC
  Administered 2018-04-03: 10:00:00 via INTRAVENOUS
  Filled 2018-04-03: qty 1000

## 2018-04-03 MED ORDER — SODIUM CHLORIDE 0.9 % IV SOLN
45.0000 mg/m2 | Freq: Once | INTRAVENOUS | Status: AC
  Administered 2018-04-03: 78 mg via INTRAVENOUS
  Filled 2018-04-03: qty 13

## 2018-04-03 MED ORDER — PALONOSETRON HCL INJECTION 0.25 MG/5ML
0.2500 mg | Freq: Once | INTRAVENOUS | Status: AC
Start: 2018-04-03 — End: 2018-04-03
  Administered 2018-04-03: 0.25 mg via INTRAVENOUS
  Filled 2018-04-03: qty 5

## 2018-04-03 MED ORDER — SODIUM CHLORIDE 0.9% FLUSH
10.0000 mL | Freq: Once | INTRAVENOUS | Status: AC
  Administered 2018-04-03: 10 mL via INTRAVENOUS
  Filled 2018-04-03: qty 10

## 2018-04-03 MED ORDER — DIPHENHYDRAMINE HCL 50 MG/ML IJ SOLN
25.0000 mg | Freq: Once | INTRAMUSCULAR | Status: AC
  Administered 2018-04-03: 25 mg via INTRAVENOUS
  Filled 2018-04-03: qty 1

## 2018-04-03 MED ORDER — FAMOTIDINE IN NACL 20-0.9 MG/50ML-% IV SOLN
20.0000 mg | Freq: Once | INTRAVENOUS | Status: AC
  Administered 2018-04-03: 20 mg via INTRAVENOUS
  Filled 2018-04-03: qty 50

## 2018-04-03 MED ORDER — HEPARIN SOD (PORK) LOCK FLUSH 100 UNIT/ML IV SOLN: 500.0000 [IU] | Freq: Once | INTRAVENOUS | Status: DC | PRN

## 2018-04-03 MED ORDER — SODIUM CHLORIDE 0.9 % IV SOLN
130.8000 mg | Freq: Once | INTRAVENOUS | Status: AC
  Administered 2018-04-03: 130 mg via INTRAVENOUS
  Filled 2018-04-03: qty 13

## 2018-04-03 MED ORDER — HEPARIN SOD (PORK) LOCK FLUSH 100 UNIT/ML IV SOLN
500.0000 [IU] | Freq: Once | INTRAVENOUS | Status: AC
  Administered 2018-04-03: 500 [IU] via INTRAVENOUS
  Filled 2018-04-03: qty 5

## 2018-04-03 MED ORDER — SODIUM CHLORIDE 0.9% FLUSH
10.0000 mL | INTRAVENOUS | Status: DC | PRN
  Filled 2018-04-03: qty 10

## 2018-04-03 MED ORDER — SODIUM CHLORIDE 0.9 % IV SOLN
20.0000 mg | Freq: Once | INTRAVENOUS | Status: AC
  Administered 2018-04-03: 20 mg via INTRAVENOUS
  Filled 2018-04-03: qty 2

## 2018-04-04 ENCOUNTER — Ambulatory Visit
Admission: RE | Admit: 2018-04-04 | Discharge: 2018-04-04 | Disposition: A | Discharge status: Home / Self Care | Payer: Medicare Other | Source: Ambulatory Visit | Attending: Radiation Oncology | Admitting: Radiation Oncology

## 2018-04-04 DIAGNOSIS — Z510 Encounter for antineoplastic radiation therapy: Secondary | ICD-10-CM | POA: Diagnosis not present

## 2018-04-04 DIAGNOSIS — K7689 Other specified diseases of liver: Secondary | ICD-10-CM | POA: Diagnosis not present

## 2018-04-04 DIAGNOSIS — Z853 Personal history of malignant neoplasm of breast: Secondary | ICD-10-CM | POA: Diagnosis not present

## 2018-04-04 DIAGNOSIS — J449 Chronic obstructive pulmonary disease, unspecified: Secondary | ICD-10-CM | POA: Diagnosis not present

## 2018-04-04 DIAGNOSIS — I10 Essential (primary) hypertension: Secondary | ICD-10-CM | POA: Diagnosis not present

## 2018-04-04 DIAGNOSIS — Z87891 Personal history of nicotine dependence: Secondary | ICD-10-CM | POA: Diagnosis not present

## 2018-04-04 DIAGNOSIS — C3411 Malignant neoplasm of upper lobe, right bronchus or lung: Secondary | ICD-10-CM | POA: Diagnosis not present

## 2018-04-04 LAB — ACID FAST CULTURE WITH REFLEXED SENSITIVITIES (MYCOBACTERIA): Acid Fast Culture: NEGATIVE

## 2018-04-05 ENCOUNTER — Ambulatory Visit
Admission: RE | Admit: 2018-04-05 | Discharge: 2018-04-05 | Disposition: A | Discharge status: Home / Self Care | Payer: Medicare Other | Source: Ambulatory Visit | Attending: Radiation Oncology | Admitting: Radiation Oncology

## 2018-04-05 DIAGNOSIS — C3411 Malignant neoplasm of upper lobe, right bronchus or lung: Secondary | ICD-10-CM | POA: Diagnosis not present

## 2018-04-05 DIAGNOSIS — K7689 Other specified diseases of liver: Secondary | ICD-10-CM | POA: Diagnosis not present

## 2018-04-05 DIAGNOSIS — Z87891 Personal history of nicotine dependence: Secondary | ICD-10-CM | POA: Diagnosis not present

## 2018-04-05 DIAGNOSIS — J449 Chronic obstructive pulmonary disease, unspecified: Secondary | ICD-10-CM | POA: Diagnosis not present

## 2018-04-05 DIAGNOSIS — Z510 Encounter for antineoplastic radiation therapy: Secondary | ICD-10-CM | POA: Diagnosis not present

## 2018-04-05 DIAGNOSIS — Z853 Personal history of malignant neoplasm of breast: Secondary | ICD-10-CM | POA: Diagnosis not present

## 2018-04-05 DIAGNOSIS — I10 Essential (primary) hypertension: Secondary | ICD-10-CM | POA: Diagnosis not present

## 2018-04-06 ENCOUNTER — Ambulatory Visit
Admission: RE | Admit: 2018-04-06 | Discharge: 2018-04-06 | Disposition: A | Discharge status: Home / Self Care | Payer: Medicare Other | Source: Ambulatory Visit | Attending: Radiation Oncology | Admitting: Radiation Oncology

## 2018-04-06 DIAGNOSIS — K7689 Other specified diseases of liver: Secondary | ICD-10-CM | POA: Diagnosis not present

## 2018-04-06 DIAGNOSIS — Z87891 Personal history of nicotine dependence: Secondary | ICD-10-CM | POA: Diagnosis not present

## 2018-04-06 DIAGNOSIS — I10 Essential (primary) hypertension: Secondary | ICD-10-CM | POA: Diagnosis not present

## 2018-04-06 DIAGNOSIS — C3411 Malignant neoplasm of upper lobe, right bronchus or lung: Secondary | ICD-10-CM | POA: Diagnosis not present

## 2018-04-06 DIAGNOSIS — Z510 Encounter for antineoplastic radiation therapy: Secondary | ICD-10-CM | POA: Diagnosis not present

## 2018-04-06 DIAGNOSIS — Z853 Personal history of malignant neoplasm of breast: Secondary | ICD-10-CM | POA: Diagnosis not present

## 2018-04-06 DIAGNOSIS — J449 Chronic obstructive pulmonary disease, unspecified: Secondary | ICD-10-CM | POA: Diagnosis not present

## 2018-04-07 ENCOUNTER — Ambulatory Visit
Admission: RE | Admit: 2018-04-07 | Discharge: 2018-04-07 | Disposition: A | Discharge status: Home / Self Care | Payer: Medicare Other | Source: Ambulatory Visit | Attending: Radiation Oncology | Admitting: Radiation Oncology

## 2018-04-07 DIAGNOSIS — J449 Chronic obstructive pulmonary disease, unspecified: Secondary | ICD-10-CM | POA: Diagnosis not present

## 2018-04-07 DIAGNOSIS — K7689 Other specified diseases of liver: Secondary | ICD-10-CM | POA: Diagnosis not present

## 2018-04-07 DIAGNOSIS — I10 Essential (primary) hypertension: Secondary | ICD-10-CM | POA: Diagnosis not present

## 2018-04-07 DIAGNOSIS — C3411 Malignant neoplasm of upper lobe, right bronchus or lung: Secondary | ICD-10-CM | POA: Diagnosis not present

## 2018-04-07 DIAGNOSIS — Z853 Personal history of malignant neoplasm of breast: Secondary | ICD-10-CM | POA: Diagnosis not present

## 2018-04-07 DIAGNOSIS — Z87891 Personal history of nicotine dependence: Secondary | ICD-10-CM | POA: Diagnosis not present

## 2018-04-07 DIAGNOSIS — Z510 Encounter for antineoplastic radiation therapy: Secondary | ICD-10-CM | POA: Diagnosis not present

## 2018-04-10 ENCOUNTER — Inpatient Hospital Stay (HOSPITAL_BASED_OUTPATIENT_CLINIC_OR_DEPARTMENT_OTHER): Payer: Medicare Other | Admitting: Oncology

## 2018-04-10 ENCOUNTER — Other Ambulatory Visit: Payer: Self-pay

## 2018-04-10 ENCOUNTER — Ambulatory Visit
Admission: RE | Admit: 2018-04-10 | Discharge: 2018-04-10 | Disposition: A | Discharge status: Home / Self Care | Payer: Medicare Other | Source: Ambulatory Visit | Attending: Radiation Oncology | Admitting: Radiation Oncology

## 2018-04-10 ENCOUNTER — Inpatient Hospital Stay: Payer: Medicare Other

## 2018-04-10 ENCOUNTER — Encounter: Payer: Self-pay | Admitting: Oncology

## 2018-04-10 VITALS — BP 135/82 | HR 88 | Temp 97.5°F | Wt 144.1 lb

## 2018-04-10 DIAGNOSIS — C3411 Malignant neoplasm of upper lobe, right bronchus or lung: Secondary | ICD-10-CM

## 2018-04-10 DIAGNOSIS — R110 Nausea: Secondary | ICD-10-CM

## 2018-04-10 DIAGNOSIS — R630 Anorexia: Secondary | ICD-10-CM

## 2018-04-10 DIAGNOSIS — R51 Headache: Secondary | ICD-10-CM | POA: Diagnosis not present

## 2018-04-10 DIAGNOSIS — Z5111 Encounter for antineoplastic chemotherapy: Secondary | ICD-10-CM

## 2018-04-10 DIAGNOSIS — J449 Chronic obstructive pulmonary disease, unspecified: Secondary | ICD-10-CM | POA: Diagnosis not present

## 2018-04-10 DIAGNOSIS — J90 Pleural effusion, not elsewhere classified: Secondary | ICD-10-CM | POA: Diagnosis not present

## 2018-04-10 DIAGNOSIS — Z853 Personal history of malignant neoplasm of breast: Secondary | ICD-10-CM | POA: Diagnosis not present

## 2018-04-10 DIAGNOSIS — R634 Abnormal weight loss: Secondary | ICD-10-CM | POA: Diagnosis not present

## 2018-04-10 DIAGNOSIS — K7689 Other specified diseases of liver: Secondary | ICD-10-CM | POA: Diagnosis not present

## 2018-04-10 DIAGNOSIS — Z510 Encounter for antineoplastic radiation therapy: Secondary | ICD-10-CM | POA: Diagnosis not present

## 2018-04-10 DIAGNOSIS — T451X5A Adverse effect of antineoplastic and immunosuppressive drugs, initial encounter: Secondary | ICD-10-CM

## 2018-04-10 DIAGNOSIS — Z87891 Personal history of nicotine dependence: Secondary | ICD-10-CM

## 2018-04-10 DIAGNOSIS — I10 Essential (primary) hypertension: Secondary | ICD-10-CM | POA: Diagnosis not present

## 2018-04-10 DIAGNOSIS — T451X5S Adverse effect of antineoplastic and immunosuppressive drugs, sequela: Secondary | ICD-10-CM

## 2018-04-10 DIAGNOSIS — H409 Unspecified glaucoma: Secondary | ICD-10-CM | POA: Diagnosis not present

## 2018-04-10 DIAGNOSIS — R5383 Other fatigue: Secondary | ICD-10-CM | POA: Diagnosis not present

## 2018-04-10 LAB — CBC WITH DIFFERENTIAL/PLATELET
BASOS ABS: 0.1 10*3/uL (ref 0–0.1)
BASOS PCT: 1 %
EOS PCT: 2 %
Eosinophils Absolute: 0.1 10*3/uL (ref 0–0.7)
HCT: 31.4 % — ABNORMAL LOW (ref 35.0–47.0)
Hemoglobin: 10.7 g/dL — ABNORMAL LOW (ref 12.0–16.0)
Lymphocytes Relative: 9 %
Lymphs Abs: 0.5 10*3/uL — ABNORMAL LOW (ref 1.0–3.6)
MCH: 28.7 pg (ref 26.0–34.0)
MCHC: 34 g/dL (ref 32.0–36.0)
MCV: 84.5 fL (ref 80.0–100.0)
MONO ABS: 0.5 10*3/uL (ref 0.2–0.9)
Monocytes Relative: 8 %
NEUTROS ABS: 4.5 10*3/uL (ref 1.4–6.5)
Neutrophils Relative %: 80 %
PLATELETS: 238 10*3/uL (ref 150–440)
RBC: 3.72 MIL/uL — AB (ref 3.80–5.20)
RDW: 17.4 % — AB (ref 11.5–14.5)
WBC: 5.7 10*3/uL (ref 3.6–11.0)

## 2018-04-10 LAB — BASIC METABOLIC PANEL
ANION GAP: 6 (ref 5–15)
BUN: 22 mg/dL — ABNORMAL HIGH (ref 6–20)
CALCIUM: 9.4 mg/dL (ref 8.9–10.3)
CO2: 23 mmol/L (ref 22–32)
Chloride: 110 mmol/L (ref 101–111)
Creatinine, Ser: 1.17 mg/dL — ABNORMAL HIGH (ref 0.44–1.00)
GFR calc Af Amer: 50 mL/min — ABNORMAL LOW (ref >60)
GFR, EST NON AFRICAN AMERICAN: 43 mL/min — AB (ref >60)
Glucose, Bld: 100 mg/dL — ABNORMAL HIGH (ref 65–99)
POTASSIUM: 4.1 mmol/L (ref 3.5–5.1)
Sodium: 139 mmol/L (ref 135–145)

## 2018-04-10 MED ORDER — SODIUM CHLORIDE 0.9% FLUSH
10.0000 mL | INTRAVENOUS | Status: DC | PRN
  Administered 2018-04-10: 10 mL via INTRAVENOUS
  Filled 2018-04-10: qty 10

## 2018-04-10 MED ORDER — HEPARIN SOD (PORK) LOCK FLUSH 100 UNIT/ML IV SOLN
500.0000 [IU] | Freq: Once | INTRAVENOUS | Status: AC
  Administered 2018-04-10: 500 [IU] via INTRAVENOUS
  Filled 2018-04-10: qty 5

## 2018-04-10 MED ORDER — FAMOTIDINE IN NACL 20-0.9 MG/50ML-% IV SOLN
20.0000 mg | Freq: Once | INTRAVENOUS | Status: AC
  Administered 2018-04-10: 20 mg via INTRAVENOUS
  Filled 2018-04-10: qty 50

## 2018-04-10 MED ORDER — SODIUM CHLORIDE 0.9 % IV SOLN
Freq: Once | INTRAVENOUS | Status: AC
  Administered 2018-04-10: 10:00:00 via INTRAVENOUS
  Filled 2018-04-10: qty 1000

## 2018-04-10 MED ORDER — PACLITAXEL CHEMO INJECTION 300 MG/50ML
45.0000 mg/m2 | Freq: Once | INTRAVENOUS | Status: AC
  Administered 2018-04-10: 78 mg via INTRAVENOUS
  Filled 2018-04-10: qty 13

## 2018-04-10 MED ORDER — DEXAMETHASONE SODIUM PHOSPHATE 100 MG/10ML IJ SOLN
20.0000 mg | Freq: Once | INTRAMUSCULAR | Status: AC
  Administered 2018-04-10: 20 mg via INTRAVENOUS
  Filled 2018-04-10: qty 2

## 2018-04-10 MED ORDER — PALONOSETRON HCL INJECTION 0.25 MG/5ML
0.2500 mg | Freq: Once | INTRAVENOUS | Status: AC
  Administered 2018-04-10: 0.25 mg via INTRAVENOUS
  Filled 2018-04-10: qty 5

## 2018-04-10 MED ORDER — SODIUM CHLORIDE 0.9 % IV SOLN
131.4000 mg | Freq: Once | INTRAVENOUS | Status: AC
  Administered 2018-04-10: 130 mg via INTRAVENOUS
  Filled 2018-04-10: qty 13

## 2018-04-10 MED ORDER — DIPHENHYDRAMINE HCL 50 MG/ML IJ SOLN
25.0000 mg | Freq: Once | INTRAMUSCULAR | Status: AC
  Administered 2018-04-10: 25 mg via INTRAVENOUS
  Filled 2018-04-10: qty 1

## 2018-04-10 NOTE — Progress Notes (Signed)
[place] OFFICE PROGRESS NOTE  Patient Care Team: Leonel Ramsay, MD as PCP - General (Infectious Diseases) Telford Nab, RN as Registered Nurse  Cancer Staging No matching staging information was found for the patient.   Oncology History   # RUL LUNG CANCER-non-small cell; favor squamous cell; PET scan T4N0 [right upper lobe mass involving[mediastinal/blood results] vs small pleural effusion [?M1]   # Hx of Right breast ca [s/p lumpec; RT; No chemo; "pill" x5 years]  # COPD/Hx of smoking  MOLECULAR TESTING/OMNISEQ: PDL-1 25% [TPS; TMB-H; No targets**]  --------------------------------------------------    DIAGNOSIS: [ April 2019]SQUAMOUS CELL LUNG CA  STAGE: III ;GOALS: CURATIVE  CURRENT/MOST RECENT THERAPY [ May 2019]- Carbo-taxol with RT .       Primary cancer of right upper lobe of lung ([place])   03/06/2018 -  Chemotherapy    The patient had palonosetron (ALOXI) injection 0.25 mg, 0.25 mg, Intravenous,  Once, 5 of 8 cycles Administration: 0.25 mg (03/13/2018), 0.25 mg (04/10/2018), 0.25 mg (03/21/2018), 0.25 mg (03/27/2018), 0.25 mg (04/03/2018) CARBOplatin (PARAPLATIN) 130 mg in sodium chloride 0.9 % 250 mL chemo infusion, 130 mg (100 % of original dose 131.4 mg), Intravenous,  Once, 5 of 8 cycles Dose modification:   (original dose 131.4 mg, Cycle 1) Administration: 130 mg (03/13/2018), 130 mg (04/10/2018), 130 mg (03/21/2018), 130 mg (03/27/2018), 130 mg (04/03/2018) PACLitaxel (TAXOL) 78 mg in sodium chloride 0.9 % 250 mL chemo infusion (</= 87m/m2), 45 mg/m2 = 78 mg, Intravenous,  Once, 5 of 8 cycles Administration: 78 mg (03/13/2018), 78 mg (04/10/2018), 78 mg (03/21/2018), 78 mg (03/27/2018), 78 mg (04/03/2018)  for chemotherapy treatment.          INTERVAL HISTORY:  Brenda [id number].o.  female pleasant patient above history of stage III squamous cell lung cancer currently on chemo and radiation for assessment prior to chemotherapy. I am covering  Dr.Brhamanday to see this patient. Medical history review was performed.  Patient was started on concurrent chemoradiation with carbotaxol since May 2019.  #Chemotherapy: Reports tolerating treatment well.  Denies any nausea vomiting. #COPD : She was prescribed with Advair inhalers but is concerned about potential worsening of glaucoma.  Shortness of breath at baseline.  Stable. #Fatigue: Stable.   Review of Systems  Constitutional: Positive for malaise/fatigue. Negative for chills, diaphoresis, fever and weight loss.  HENT: Negative for congestion, ear discharge, ear pain, nosebleeds, sinus pain and sore throat.   Eyes: Negative for double vision, photophobia, pain, discharge and redness.  Respiratory: Positive for shortness of breath. Negative for cough, hemoptysis, sputum production and wheezing.   Cardiovascular: Negative for chest pain, palpitations, orthopnea, claudication and leg swelling.  Gastrointestinal: Negative for abdominal pain, blood in stool, constipation, diarrhea, heartburn, melena, nausea and vomiting.  Genitourinary: Negative for dysuria, flank pain, frequency, hematuria and urgency.  Musculoskeletal: Negative for back pain, joint pain, myalgias and neck pain.  Skin: Negative for itching and rash.  Neurological: Negative for dizziness, tingling, tremors, focal weakness, weakness and headaches.  Endo/Heme/Allergies: Negative for environmental allergies. Does not bruise/bleed easily.  Psychiatric/Behavioral: Negative for depression and hallucinations. The patient is nervous/anxious. The patient does not have insomnia.       PAST MEDICAL HISTORY :  Past Medical History:  Diagnosis Date  . Benign liver cyst   . Breast cancer ([place] 2005   right breast  . COPD ([place] obstructive pulmonary disease) ([place]   . Dyspnea   . Glaucoma   . Hemoptysis 2019  .  Hypertension    Not on medication for htn. patient denies this  . Lung mass 01/2018  . Personal history of  radiation therapy     PAST SURGICAL HISTORY :   Past Surgical History:  Procedure Laterality Date  . BREAST EXCISIONAL BIOPSY Right 2005   positive, radiation  . CHOLECYSTECTOMY  1988  . COLONOSCOPY  2012  . ENDOBRONCHIAL ULTRASOUND N/A 02/21/2018   Procedure: ENDOBRONCHIAL ULTRASOUND;  Surgeon: Laverle Hobby, MD;  Location: [facility] ORS;  Service: Pulmonary;  Laterality: N/A;  . EYE SURGERY Bilateral 2009,2010   cataract extractions with lens implant  . GALLBLADDER SURGERY Bilateral   . IR FLUORO GUIDE PORT INSERTION RIGHT  03/10/2018  . JOINT REPLACEMENT Left 2008   left hip replacement  . PORTACATH PLACEMENT  03/10/2018  . TONSILLECTOMY  1960    FAMILY HISTORY :   Family History  Problem Relation Age of Onset  . Breast cancer Mother 19    SOCIAL HISTORY:   Social History   Tobacco Use  . Smoking status: Former Smoker    Packs/day: 1.00    Types: Cigarettes    Last attempt to quit: 10/25/2005    Years since quitting: 12.4  . Smokeless tobacco: Never Used  Substance Use Topics  . Alcohol use: Not Currently    Comment: occasional glass of beer  . Drug use: Never    ALLERGIES:  is allergic to penicillins.  MEDICATIONS:  Current Outpatient Medications  Medication Sig Dispense Refill  . aspirin EC 81 MG tablet Take 81 mg by mouth daily.    . Cholecalciferol (VITAMIN D3) 3000 units TABS Take 3,000 Units by mouth daily.     Marland Kitchen latanoprost (XALATAN) 0.005 % ophthalmic solution Place 1 drop into both eyes at bedtime.    . lidocaine-prilocaine (EMLA) cream Apply 1 application topically as needed. To port a cath site 30 g 6  . albuterol (PROVENTIL HFA) 108 (90 Base) MCG/ACT inhaler Inhale 2 puffs into the lungs every 6 (six) hours as needed for wheezing or shortness of breath.     . Fluticasone-Salmeterol (ADVAIR DISKUS) 500-50 MCG/DOSE AEPB Inhale 1 puff into the lungs 2 (two) times daily. (Patient not taking: Reported on 04/10/2018) 1 each 3  . ondansetron (ZOFRAN) 8 MG  tablet Take 1 tablet (8 mg total) by mouth every 8 (eight) hours as needed for nausea or vomiting. (Patient not taking: Reported on 04/10/2018) 20 tablet 3  . prochlorperazine (COMPAZINE) 10 MG tablet Take 1 tablet (10 mg total) by mouth every 6 (six) hours as needed for nausea or vomiting. (Patient not taking: Reported on 04/10/2018) 30 tablet 3   No current facility-administered medications for this visit.     PHYSICAL EXAMINATION: ECOG PERFORMANCE STATUS: 1 - Symptomatic but completely ambulatory  BP 135/82 (BP Location: Left Arm, Patient Position: Sitting)   Pulse 88   Temp (!) 97.5 F (36.4 C) (Oral)   Wt 144 lb 1.6 oz (65.4 kg)   BMI 24.73 kg/m   Filed Weights   04/10/18 0920  Weight: 144 lb 1.6 oz (65.4 kg)   Physical Exam  Constitutional: She is oriented to person, place, and time and well-developed, well-nourished, and in no distress. No distress.  HENT:  Head: Normocephalic and atraumatic.  Nose: Nose normal.  Mouth/Throat: Oropharynx is clear and moist. No oropharyngeal exudate.  Eyes: Pupils are equal, round, and reactive to light. EOM are normal. Left eye exhibits no discharge. No scleral icterus.  Neck: Normal range of motion. Neck  supple. No JVD present.  Cardiovascular: Normal rate, regular rhythm and normal heart sounds.  No murmur heard. Pulmonary/Chest: Effort normal. No respiratory distress. She has no wheezes.  Decreased breath bilaterally.  Abdominal: Soft. She exhibits no distension and no mass. There is no tenderness. There is no rebound.  Musculoskeletal: Normal range of motion. She exhibits no edema or tenderness.  Lymphadenopathy:    She has no cervical adenopathy.  Neurological: She is alert and oriented to person, place, and time. No cranial nerve deficit. She exhibits normal muscle tone. Coordination normal.  Skin: Skin is warm and dry. No rash noted. She is not diaphoretic. No erythema.  Psychiatric: Affect and judgment normal.     LABORATORY  DATA:  I have reviewed the data as listed    Component Value Date/Time   NA 139 04/10/2018 0902   K 4.1 04/10/2018 0902   CL 110 04/10/2018 0902   CO2 23 04/10/2018 0902   GLUCOSE 100 (H) 04/10/2018 0902   BUN 22 (H) 04/10/2018 0902   CREATININE 1.17 (H) 04/10/2018 0902   CALCIUM 9.4 04/10/2018 0902   PROT 7.1 04/03/2018 0942   ALBUMIN 3.5 04/03/2018 0942   AST 24 04/03/2018 0942   ALT 25 04/03/2018 0942   ALKPHOS 71 04/03/2018 0942   BILITOT 0.6 04/03/2018 0942   GFRNONAA 43 (L) 04/10/2018 0902   GFRAA 50 (L) 04/10/2018 0902    No results found for: SPEP, UPEP  Lab Results  Component Value Date   WBC 5.7 04/10/2018   NEUTROABS 4.5 04/10/2018   HGB 10.7 (L) 04/10/2018   HCT 31.4 (L) 04/10/2018   MCV 84.5 04/10/2018   PLT 238 04/10/2018      Chemistry      Component Value Date/Time   NA 139 04/10/2018 0902   K 4.1 04/10/2018 0902   CL 110 04/10/2018 0902   CO2 23 04/10/2018 0902   BUN 22 (H) 04/10/2018 0902   CREATININE 1.17 (H) 04/10/2018 0902      Component Value Date/Time   CALCIUM 9.4 04/10/2018 0902   ALKPHOS 71 04/03/2018 0942   AST 24 04/03/2018 0942   ALT 25 04/03/2018 0942   BILITOT 0.6 04/03/2018 0942       RADIOGRAPHIC STUDIES: I have personally reviewed the radiological images as listed and agreed with the findings in the report. No results found.   ASSESSMENT & PLAN:  1. Primary cancer of right upper lobe of lung ([place])   2. Chemotherapy-induced nausea   3. Chronic obstructive pulmonary disease, unspecified COPD type ([place])   4. Encounter for antineoplastic chemotherapy    #Lung cancer: Patient appears tolerating chemo and radiation very well.  Labs are reviewed counts acceptable proceed with today's carbotaxol treatment.  #Patient is concerned that her inhalers will need to react with her glaucoma.  Discussed with patient that with the  mild steroids she inhaled, should be fine with her glaucoma.  And she is also on glaucoma treatment.  I  also advised her to give her ophthalmologist to call and make sure that there is no interaction between the inhaler and her glaucoma.  Patient and her son voiced understanding. # Chemotherapy-induced nausea# : Stable.  Manageable with antiemetics.  She will follow-up in 1 week lab MD with Dr. B for assessment prior to next weekly treatment.  Orders Placed This Encounter  Procedures  . CBC with Differential/Platelet    Standing Status:   Future    Standing Expiration Date:   04/11/2019  .  Comprehensive metabolic panel    Standing Status:   Future    Standing Expiration Date:   04/11/2019   All questions were answered. The patient knows to call the clinic with any problems, questions or concerns.  Total face to face encounter time for this patient visit was 25 min. >50% of the time was  spent in counseling and coordination of care.     Earlie Server, MD 04/10/2018 4:38 PM

## 2018-04-10 NOTE — Progress Notes (Signed)
Nutrition Follow-up:  Patient with lung cancer followed by Dr. Jacinto Reap.  Met with patient during infusion today.  Patient reports appetite is good, tends to eat more in the evening.  "I gained 2 pounds so you should be proud."  Patient denies nutrition impact symptoms at this time.    Medications: reviewed  Labs: BUN 22, creatinine 1.17  Anthropometrics:   Weight increased to 144 lb 1.6 oz  UBW of 190 lb about 6 months ago)   NUTRITION DIAGNOSIS: Inadequate oral intake improving   INTERVENTION:   Encouraged patient to continue good nutrition during treatment    MONITORING, EVALUATION, GOAL: Patient will consume adequate calories and protein to meet nutritional needs   NEXT VISIT: June 24 during infusion  Brenda Parker, [place], [place] Registered Dietitian [phone] (pager)

## 2018-04-10 NOTE — Progress Notes (Signed)
Patient here today for follow up. Pt concerned about inhalers and side effects.

## 2018-04-11 ENCOUNTER — Ambulatory Visit
Admission: RE | Admit: 2018-04-11 | Discharge: 2018-04-11 | Disposition: A | Discharge status: Home / Self Care | Payer: Medicare Other | Source: Ambulatory Visit | Attending: Radiation Oncology | Admitting: Radiation Oncology

## 2018-04-11 DIAGNOSIS — I10 Essential (primary) hypertension: Secondary | ICD-10-CM | POA: Diagnosis not present

## 2018-04-11 DIAGNOSIS — J449 Chronic obstructive pulmonary disease, unspecified: Secondary | ICD-10-CM | POA: Diagnosis not present

## 2018-04-11 DIAGNOSIS — Z853 Personal history of malignant neoplasm of breast: Secondary | ICD-10-CM | POA: Diagnosis not present

## 2018-04-11 DIAGNOSIS — K7689 Other specified diseases of liver: Secondary | ICD-10-CM | POA: Diagnosis not present

## 2018-04-11 DIAGNOSIS — Z510 Encounter for antineoplastic radiation therapy: Secondary | ICD-10-CM | POA: Diagnosis not present

## 2018-04-11 DIAGNOSIS — Z87891 Personal history of nicotine dependence: Secondary | ICD-10-CM | POA: Diagnosis not present

## 2018-04-11 DIAGNOSIS — C3411 Malignant neoplasm of upper lobe, right bronchus or lung: Secondary | ICD-10-CM | POA: Diagnosis not present

## 2018-04-12 ENCOUNTER — Other Ambulatory Visit: Payer: Self-pay | Admitting: *Deleted

## 2018-04-12 ENCOUNTER — Ambulatory Visit
Admission: RE | Admit: 2018-04-12 | Discharge: 2018-04-12 | Disposition: A | Discharge status: Home / Self Care | Payer: Medicare Other | Source: Ambulatory Visit | Attending: Radiation Oncology | Admitting: Radiation Oncology

## 2018-04-12 DIAGNOSIS — Z87891 Personal history of nicotine dependence: Secondary | ICD-10-CM | POA: Diagnosis not present

## 2018-04-12 DIAGNOSIS — J449 Chronic obstructive pulmonary disease, unspecified: Secondary | ICD-10-CM | POA: Diagnosis not present

## 2018-04-12 DIAGNOSIS — C3411 Malignant neoplasm of upper lobe, right bronchus or lung: Secondary | ICD-10-CM | POA: Diagnosis not present

## 2018-04-12 DIAGNOSIS — Z853 Personal history of malignant neoplasm of breast: Secondary | ICD-10-CM | POA: Diagnosis not present

## 2018-04-12 DIAGNOSIS — K7689 Other specified diseases of liver: Secondary | ICD-10-CM | POA: Diagnosis not present

## 2018-04-12 DIAGNOSIS — Z510 Encounter for antineoplastic radiation therapy: Secondary | ICD-10-CM | POA: Diagnosis not present

## 2018-04-12 DIAGNOSIS — I10 Essential (primary) hypertension: Secondary | ICD-10-CM | POA: Diagnosis not present

## 2018-04-12 MED ORDER — FLUTICASONE-SALMETEROL 500-50 MCG/DOSE IN AEPB: 1.0000 | INHALATION_SPRAY | Freq: Two times a day (BID) | RESPIRATORY_TRACT | 0 refills | Status: DC

## 2018-04-13 ENCOUNTER — Ambulatory Visit
Admission: RE | Admit: 2018-04-13 | Discharge: 2018-04-13 | Disposition: A | Discharge status: Home / Self Care | Payer: Medicare Other | Source: Ambulatory Visit | Attending: Radiation Oncology | Admitting: Radiation Oncology

## 2018-04-13 DIAGNOSIS — J449 Chronic obstructive pulmonary disease, unspecified: Secondary | ICD-10-CM | POA: Diagnosis not present

## 2018-04-13 DIAGNOSIS — C3411 Malignant neoplasm of upper lobe, right bronchus or lung: Secondary | ICD-10-CM | POA: Diagnosis not present

## 2018-04-13 DIAGNOSIS — Z87891 Personal history of nicotine dependence: Secondary | ICD-10-CM | POA: Diagnosis not present

## 2018-04-13 DIAGNOSIS — Z853 Personal history of malignant neoplasm of breast: Secondary | ICD-10-CM | POA: Diagnosis not present

## 2018-04-13 DIAGNOSIS — Z510 Encounter for antineoplastic radiation therapy: Secondary | ICD-10-CM | POA: Diagnosis not present

## 2018-04-13 DIAGNOSIS — K7689 Other specified diseases of liver: Secondary | ICD-10-CM | POA: Diagnosis not present

## 2018-04-13 DIAGNOSIS — I10 Essential (primary) hypertension: Secondary | ICD-10-CM | POA: Diagnosis not present

## 2018-04-14 ENCOUNTER — Ambulatory Visit
Admission: RE | Admit: 2018-04-14 | Discharge: 2018-04-14 | Disposition: A | Discharge status: Home / Self Care | Payer: Medicare Other | Source: Ambulatory Visit | Attending: Radiation Oncology | Admitting: Radiation Oncology

## 2018-04-14 DIAGNOSIS — I10 Essential (primary) hypertension: Secondary | ICD-10-CM | POA: Diagnosis not present

## 2018-04-14 DIAGNOSIS — Z510 Encounter for antineoplastic radiation therapy: Secondary | ICD-10-CM | POA: Diagnosis not present

## 2018-04-14 DIAGNOSIS — C3411 Malignant neoplasm of upper lobe, right bronchus or lung: Secondary | ICD-10-CM | POA: Diagnosis not present

## 2018-04-14 DIAGNOSIS — C3491 Malignant neoplasm of unspecified part of right bronchus or lung: Secondary | ICD-10-CM | POA: Diagnosis not present

## 2018-04-14 DIAGNOSIS — Z853 Personal history of malignant neoplasm of breast: Secondary | ICD-10-CM | POA: Diagnosis not present

## 2018-04-14 DIAGNOSIS — J439 Emphysema, unspecified: Secondary | ICD-10-CM | POA: Diagnosis not present

## 2018-04-14 DIAGNOSIS — K7689 Other specified diseases of liver: Secondary | ICD-10-CM | POA: Diagnosis not present

## 2018-04-14 DIAGNOSIS — J449 Chronic obstructive pulmonary disease, unspecified: Secondary | ICD-10-CM | POA: Diagnosis not present

## 2018-04-14 DIAGNOSIS — Z87891 Personal history of nicotine dependence: Secondary | ICD-10-CM | POA: Diagnosis not present

## 2018-04-14 DIAGNOSIS — R0609 Other forms of dyspnea: Secondary | ICD-10-CM | POA: Diagnosis not present

## 2018-04-17 ENCOUNTER — Inpatient Hospital Stay: Payer: Medicare Other

## 2018-04-17 ENCOUNTER — Encounter: Payer: Self-pay | Admitting: Internal Medicine

## 2018-04-17 ENCOUNTER — Inpatient Hospital Stay (HOSPITAL_BASED_OUTPATIENT_CLINIC_OR_DEPARTMENT_OTHER): Payer: Medicare Other | Admitting: Internal Medicine

## 2018-04-17 ENCOUNTER — Other Ambulatory Visit: Payer: Self-pay

## 2018-04-17 ENCOUNTER — Ambulatory Visit
Admission: RE | Admit: 2018-04-17 | Discharge: 2018-04-17 | Disposition: A | Discharge status: Home / Self Care | Payer: Medicare Other | Source: Ambulatory Visit | Attending: Radiation Oncology | Admitting: Radiation Oncology

## 2018-04-17 VITALS — BP 145/85 | HR 85 | Temp 97.9°F | Resp 18 | Ht 64.0 in | Wt 145.0 lb

## 2018-04-17 DIAGNOSIS — J90 Pleural effusion, not elsewhere classified: Secondary | ICD-10-CM | POA: Diagnosis not present

## 2018-04-17 DIAGNOSIS — Z5111 Encounter for antineoplastic chemotherapy: Secondary | ICD-10-CM | POA: Diagnosis not present

## 2018-04-17 DIAGNOSIS — R51 Headache: Secondary | ICD-10-CM | POA: Diagnosis not present

## 2018-04-17 DIAGNOSIS — R630 Anorexia: Secondary | ICD-10-CM

## 2018-04-17 DIAGNOSIS — I10 Essential (primary) hypertension: Secondary | ICD-10-CM | POA: Diagnosis not present

## 2018-04-17 DIAGNOSIS — Z79899 Other long term (current) drug therapy: Secondary | ICD-10-CM

## 2018-04-17 DIAGNOSIS — H409 Unspecified glaucoma: Secondary | ICD-10-CM | POA: Diagnosis not present

## 2018-04-17 DIAGNOSIS — R634 Abnormal weight loss: Secondary | ICD-10-CM | POA: Diagnosis not present

## 2018-04-17 DIAGNOSIS — K7689 Other specified diseases of liver: Secondary | ICD-10-CM | POA: Diagnosis not present

## 2018-04-17 DIAGNOSIS — Z510 Encounter for antineoplastic radiation therapy: Secondary | ICD-10-CM | POA: Diagnosis not present

## 2018-04-17 DIAGNOSIS — Z87891 Personal history of nicotine dependence: Secondary | ICD-10-CM

## 2018-04-17 DIAGNOSIS — C3411 Malignant neoplasm of upper lobe, right bronchus or lung: Secondary | ICD-10-CM

## 2018-04-17 DIAGNOSIS — Z853 Personal history of malignant neoplasm of breast: Secondary | ICD-10-CM | POA: Diagnosis not present

## 2018-04-17 DIAGNOSIS — R110 Nausea: Secondary | ICD-10-CM | POA: Diagnosis not present

## 2018-04-17 DIAGNOSIS — R5383 Other fatigue: Secondary | ICD-10-CM

## 2018-04-17 DIAGNOSIS — J449 Chronic obstructive pulmonary disease, unspecified: Secondary | ICD-10-CM | POA: Diagnosis not present

## 2018-04-17 DIAGNOSIS — T451X5S Adverse effect of antineoplastic and immunosuppressive drugs, sequela: Secondary | ICD-10-CM | POA: Diagnosis not present

## 2018-04-17 LAB — COMPREHENSIVE METABOLIC PANEL
ALBUMIN: 3.5 g/dL (ref 3.5–5.0)
ALT: 18 U/L (ref 14–54)
AST: 20 U/L (ref 15–41)
Alkaline Phosphatase: 70 U/L (ref 38–126)
Anion gap: 7 (ref 5–15)
BUN: 20 mg/dL (ref 6–20)
CO2: 22 mmol/L (ref 22–32)
CREATININE: 1.14 mg/dL — AB (ref 0.44–1.00)
Calcium: 9.3 mg/dL (ref 8.9–10.3)
Chloride: 111 mmol/L (ref 101–111)
GFR calc non Af Amer: 45 mL/min — ABNORMAL LOW (ref >60)
GFR, EST AFRICAN AMERICAN: 52 mL/min — AB (ref >60)
GLUCOSE: 101 mg/dL — AB (ref 65–99)
Potassium: 4 mmol/L (ref 3.5–5.1)
SODIUM: 140 mmol/L (ref 135–145)
Total Bilirubin: 0.5 mg/dL (ref 0.3–1.2)
Total Protein: 6.8 g/dL (ref 6.5–8.1)

## 2018-04-17 LAB — CBC WITH DIFFERENTIAL/PLATELET
Basophils Absolute: 0 10*3/uL (ref 0–0.1)
Basophils Relative: 1 %
EOS ABS: 0.2 10*3/uL (ref 0–0.7)
Eosinophils Relative: 4 %
HCT: 31 % — ABNORMAL LOW (ref 35.0–47.0)
HEMOGLOBIN: 10.3 g/dL — AB (ref 12.0–16.0)
LYMPHS ABS: 0.4 10*3/uL — AB (ref 1.0–3.6)
Lymphocytes Relative: 9 %
MCH: 28.5 pg (ref 26.0–34.0)
MCHC: 33.2 g/dL (ref 32.0–36.0)
MCV: 85.8 fL (ref 80.0–100.0)
MONOS PCT: 8 %
Monocytes Absolute: 0.4 10*3/uL (ref 0.2–0.9)
NEUTROS PCT: 78 %
Neutro Abs: 3.7 10*3/uL (ref 1.4–6.5)
Platelets: 215 10*3/uL (ref 150–440)
RBC: 3.62 MIL/uL — ABNORMAL LOW (ref 3.80–5.20)
RDW: 17.7 % — ABNORMAL HIGH (ref 11.5–14.5)
WBC: 4.7 10*3/uL (ref 3.6–11.0)

## 2018-04-17 MED ORDER — SODIUM CHLORIDE 0.9% FLUSH
10.0000 mL | INTRAVENOUS | Status: DC | PRN
  Administered 2018-04-17: 10 mL via INTRAVENOUS
  Filled 2018-04-17: qty 10

## 2018-04-17 MED ORDER — SODIUM CHLORIDE 0.9 % IV SOLN
133.6000 mg | Freq: Once | INTRAVENOUS | Status: AC
  Administered 2018-04-17: 130 mg via INTRAVENOUS
  Filled 2018-04-17: qty 13

## 2018-04-17 MED ORDER — SODIUM CHLORIDE 0.9 % IV SOLN
Freq: Once | INTRAVENOUS | Status: AC
  Administered 2018-04-17: 11:00:00 via INTRAVENOUS
  Filled 2018-04-17: qty 1000

## 2018-04-17 MED ORDER — SODIUM CHLORIDE 0.9 % IV SOLN
20.0000 mg | Freq: Once | INTRAVENOUS | Status: AC
  Administered 2018-04-17: 20 mg via INTRAVENOUS
  Filled 2018-04-17: qty 2

## 2018-04-17 MED ORDER — DIPHENHYDRAMINE HCL 50 MG/ML IJ SOLN
25.0000 mg | Freq: Once | INTRAMUSCULAR | Status: AC
  Administered 2018-04-17: 25 mg via INTRAVENOUS
  Filled 2018-04-17: qty 1

## 2018-04-17 MED ORDER — HEPARIN SOD (PORK) LOCK FLUSH 100 UNIT/ML IV SOLN
500.0000 [IU] | Freq: Once | INTRAVENOUS | Status: AC
  Administered 2018-04-17: 500 [IU] via INTRAVENOUS
  Filled 2018-04-17: qty 5

## 2018-04-17 MED ORDER — PALONOSETRON HCL INJECTION 0.25 MG/5ML
0.2500 mg | Freq: Once | INTRAVENOUS | Status: AC
  Administered 2018-04-17: 0.25 mg via INTRAVENOUS
  Filled 2018-04-17: qty 5

## 2018-04-17 MED ORDER — FAMOTIDINE IN NACL 20-0.9 MG/50ML-% IV SOLN
20.0000 mg | Freq: Once | INTRAVENOUS | Status: AC
  Administered 2018-04-17: 20 mg via INTRAVENOUS
  Filled 2018-04-17: qty 50

## 2018-04-17 MED ORDER — SODIUM CHLORIDE 0.9 % IV SOLN
45.0000 mg/m2 | Freq: Once | INTRAVENOUS | Status: AC
  Administered 2018-04-17: 78 mg via INTRAVENOUS
  Filled 2018-04-17: qty 13

## 2018-04-17 NOTE — Progress Notes (Signed)
[place] OFFICE PROGRESS NOTE  Patient Care Team: Leonel Ramsay, MD as PCP - General (Infectious Diseases) Telford Nab, RN as Registered Nurse  Cancer Staging No matching staging information was found for the patient.   Oncology History   # RUL LUNG CANCER-non-small cell; favor squamous cell; PET scan T4N0 [right upper lobe mass involving[mediastinal/blood results] vs small pleural effusion [?M1]   # Hx of Right breast ca [s/p lumpec; RT; No chemo; "pill" x5 years]  # COPD/Hx of smoking  MOLECULAR TESTING/OMNISEQ: PDL-1 25% [TPS; TMB-H; No targets**]  --------------------------------------------------    DIAGNOSIS: [ April 2019]SQUAMOUS CELL LUNG CA  STAGE: III ;GOALS: CURATIVE  CURRENT/MOST RECENT THERAPY [ May 2019]- Carbo-taxol with RT .       Primary cancer of right upper lobe of lung ([place])   03/06/2018 -  Chemotherapy    The patient had palonosetron (ALOXI) injection 0.25 mg, 0.25 mg, Intravenous,  Once, 6 of 8 cycles Administration: 0.25 mg (03/13/2018), 0.25 mg (04/10/2018), 0.25 mg (04/17/2018), 0.25 mg (03/21/2018), 0.25 mg (03/27/2018), 0.25 mg (04/03/2018) CARBOplatin (PARAPLATIN) 130 mg in sodium chloride 0.9 % 250 mL chemo infusion, 130 mg (100 % of original dose 131.4 mg), Intravenous,  Once, 6 of 8 cycles Dose modification:   (original dose 131.4 mg, Cycle 1) Administration: 130 mg (03/13/2018), 130 mg (04/10/2018), 130 mg (04/17/2018), 130 mg (03/21/2018), 130 mg (03/27/2018), 130 mg (04/03/2018) PACLitaxel (TAXOL) 78 mg in sodium chloride 0.9 % 250 mL chemo infusion (</= 61m/m2), 45 mg/m2 = 78 mg, Intravenous,  Once, 6 of 8 cycles Administration: 78 mg (03/13/2018), 78 mg (04/10/2018), 78 mg (04/17/2018), 78 mg (03/21/2018), 78 mg (03/27/2018), 78 mg (04/03/2018)  for chemotherapy treatment.          INTERVAL HISTORY:  Brenda BEERS89y.o.  female pleasant patient above history of stage III lung cancer on chemoradiation is here for  follow-up.  Patient continues to have mild to moderate shortness of breath and cough.  Otherwise no hemoptysis.  No tingling or numbness.  Positive for fatigue.  No bone pain.   Review of Systems  Constitutional: Positive for malaise/fatigue. Negative for chills, diaphoresis, fever and weight loss.  HENT: Negative for nosebleeds and sore throat.   Eyes: Negative for double vision.  Respiratory: Positive for cough and shortness of breath. Negative for hemoptysis, sputum production and wheezing.   Cardiovascular: Negative for chest pain, palpitations, orthopnea and leg swelling.  Gastrointestinal: Negative for abdominal pain, blood in stool, constipation, diarrhea, heartburn, melena, nausea and vomiting.  Genitourinary: Negative for dysuria, frequency and urgency.  Musculoskeletal: Negative for back pain and joint pain.  Skin: Negative.  Negative for itching and rash.  Neurological: Negative for dizziness, tingling, focal weakness, weakness and headaches.  Endo/Heme/Allergies: Does not bruise/bleed easily.  Psychiatric/Behavioral: Negative for depression. The patient is not nervous/anxious and does not have insomnia.       PAST MEDICAL HISTORY :  Past Medical History:  Diagnosis Date  . Benign liver cyst   . [place] [facility] [place]   [place]  . [place]   . Dyspnea   . Glaucoma   . Hemoptysis 2019  . Hypertension    Not on medication for htn. patient denies this  . Lung mass 01/2018  . Personal history of radiation therapy     PAST SURGICAL HISTORY :   Past Surgical History:  Procedure Laterality Date  . BREAST EXCISIONAL BIOPSY Right 2005   positive,  radiation  . CHOLECYSTECTOMY  1988  . COLONOSCOPY  2012  . ENDOBRONCHIAL ULTRASOUND N/A 02/21/2018   Procedure: ENDOBRONCHIAL ULTRASOUND;  Surgeon: Laverle Hobby, MD;  Location: [facility] ORS;  Service: Pulmonary;  Laterality: N/A;  . EYE SURGERY Bilateral 2009,2010   cataract  extractions with lens implant  . GALLBLADDER SURGERY Bilateral   . IR FLUORO GUIDE PORT INSERTION RIGHT  03/10/2018  . JOINT REPLACEMENT Left 2008   left hip replacement  . PORTACATH PLACEMENT  03/10/2018  . TONSILLECTOMY  1960    FAMILY HISTORY :   Family History  Problem Relation Age of Onset  . Breast cancer Mother 61    SOCIAL HISTORY:   Social History   Tobacco Use  . Smoking status: Former Smoker    Packs/day: 1.00    Types: Cigarettes    Last attempt to quit: 10/25/2005    Years since quitting: 12.5  . Smokeless tobacco: Never Used  Substance Use Topics  . Alcohol use: Not Currently    Comment: occasional glass of beer  . Drug use: Never    ALLERGIES:  is allergic to penicillins.  MEDICATIONS:  Current Outpatient Medications  Medication Sig Dispense Refill  . albuterol (PROVENTIL HFA) 108 (90 Base) MCG/ACT inhaler Inhale 2 puffs into the lungs every 6 (six) hours as needed for wheezing or shortness of breath.     Marland Kitchen aspirin EC 81 MG tablet Take 81 mg by mouth daily.    . Cholecalciferol (VITAMIN D3) 3000 units TABS Take 3,000 Units by mouth daily.     . Fluticasone-Salmeterol (ADVAIR DISKUS) 500-50 MCG/DOSE AEPB Inhale 1 puff into the lungs 2 (two) times daily. 3 each 0  . latanoprost (XALATAN) 0.005 % ophthalmic solution Place 1 drop into both eyes at bedtime.    . lidocaine-prilocaine (EMLA) cream Apply 1 application topically as needed. To port a cath site 30 g 6  . ondansetron (ZOFRAN) 8 MG tablet Take 1 tablet (8 mg total) by mouth every 8 (eight) hours as needed for nausea or vomiting. 20 tablet 3  . prochlorperazine (COMPAZINE) 10 MG tablet Take 1 tablet (10 mg total) by mouth every 6 (six) hours as needed for nausea or vomiting. 30 tablet 3   No current facility-administered medications for this visit.     PHYSICAL EXAMINATION: ECOG PERFORMANCE STATUS: 1 - Symptomatic but completely ambulatory  BP (!) 145/85 (BP Location: Right Arm, Patient Position:  Sitting)   Pulse 85   Temp 97.9 F (36.6 C) (Tympanic)   Resp 18   Ht _0  (1.626 m)   Wt 145 lb (65.8 kg)   BMI 24.89 kg/m   Filed Weights   04/17/18 0944  Weight: 145 lb (65.8 kg)    GENERAL: Well-nourished well-developed; Alert, no distress and comfortable.  Accompanied by family.  EYES: no pallor or icterus OROPHARYNX: no thrush or ulceration; NECK: supple; no lymph nodes felt. LYMPH:  no palpable lymphadenopathy in the axillary or inguinal regions LUNGS: Decreased breath sounds auscultation bilaterally. No wheeze or crackles HEART/CVS: regular rate & rhythm and no murmurs; No lower extremity edema ABDOMEN:abdomen soft, non-tender and normal bowel sounds. No hepatomegaly or splenomegaly.  Musculoskeletal:no cyanosis of digits and no clubbing  PSYCH: alert & oriented x 3 with fluent speech NEURO: no focal motor/sensory deficits SKIN:  no rashes or significant lesions    LABORATORY DATA:  I have reviewed the data as listed    Component Value Date/Time   NA 140 04/17/2018 0929   K  4.0 04/17/2018 0929   CL 111 04/17/2018 0929   CO2 22 04/17/2018 0929   GLUCOSE 101 (H) 04/17/2018 0929   BUN 20 04/17/2018 0929   CREATININE 1.14 (H) 04/17/2018 0929   CALCIUM 9.3 04/17/2018 0929   PROT 6.8 04/17/2018 0929   ALBUMIN 3.5 04/17/2018 0929   AST 20 04/17/2018 0929   ALT 18 04/17/2018 0929   ALKPHOS 70 04/17/2018 0929   BILITOT 0.5 04/17/2018 0929   GFRNONAA 45 (L) 04/17/2018 0929   GFRAA 52 (L) 04/17/2018 0929    No results found for: SPEP, UPEP  Lab Results  Component Value Date   WBC 4.7 04/17/2018   NEUTROABS 3.7 04/17/2018   HGB 10.3 (L) 04/17/2018   HCT 31.0 (L) 04/17/2018   MCV 85.8 04/17/2018   PLT 215 04/17/2018      Chemistry      Component Value Date/Time   NA 140 04/17/2018 0929   K 4.0 04/17/2018 0929   CL 111 04/17/2018 0929   CO2 22 04/17/2018 0929   BUN 20 04/17/2018 0929   CREATININE 1.14 (H) 04/17/2018 0929      Component Value  Date/Time   CALCIUM 9.3 04/17/2018 0929   ALKPHOS 70 04/17/2018 0929   AST 20 04/17/2018 0929   ALT 18 04/17/2018 0929   BILITOT 0.5 04/17/2018 0929       RADIOGRAPHIC STUDIES: I have personally reviewed the radiological images as listed and agreed with the findings in the report. No results found.   ASSESSMENT & PLAN:  Primary cancer of right upper lobe of lung ([place]) #Right upper lobe lung cancer-squamous cell-stage III/ unresectable; on carboplatin Taxol with radiation.  Stable  #Continue carbotaxol with radiation; Labs today reviewed;  acceptable for treatment today.  Discussed that in general would recommend repeat imaging after finishing chemoradiation sometime in end of July/August.   #COPD-worse; recommend compliance with Advair albuterol.  #Weight loss appetite-stable/improved.  # Fatigue-stable.   # lab-taxol in 1 week; in 2 week/cbc/cmp/-MD; taxol chemo [most likley]     No orders of the defined types were placed in this encounter.  All questions were answered. The patient knows to call the clinic with any problems, questions or concerns.      [name], MD 04/23/2018 4:45 PM

## 2018-04-17 NOTE — Assessment & Plan Note (Addendum)
#  Right upper lobe lung cancer-squamous cell-stage III/ unresectable; on carboplatin Taxol with radiation.  Stable  #Continue carbotaxol with radiation; Labs today reviewed;  acceptable for treatment today.  Discussed that in general would recommend repeat imaging after finishing chemoradiation sometime in end of July/August.   #COPD-worse; recommend compliance with Advair albuterol.  #Weight loss appetite-stable/improved.  # Fatigue-stable.   # lab-taxol in 1 week; in 2 week/cbc/cmp/-MD; taxol chemo [most likley]

## 2018-04-17 NOTE — Progress Notes (Signed)
Nutrition Follow-up:  Patient with lung cancer followed by Dr. Jacinto Reap  Met with patient during infusion today.  Patient reports that appetite continues to be good.  Reports that yesterday ate frosted flakes for breakfast, bologna sandwich for lunch and pulled pork with sweet potatoes and vegetables for dinner last night.  Reports neighbour brought over pot roast with vegetables and will eat tonight for dinner.    Medications: reviewed  Labs: reviewed  Anthropometrics:   Weight 145 lb today increased 1 pounds from 144 lb on 6/17  UBW of 190 lb about 6 months ago   NUTRITION DIAGNOSIS: Inadequate oral intake improving   INTERVENTION:   Encouraged patient to continue good sources of protein and high calorie foods to continue weight gain.     MONITORING, EVALUATION, GOAL: Patient will consume adequate calories and protein to meet nutritional needs   NEXT VISIT: July 15 after radiation  Elicia Lui B. Zenia Resides, [place], [place] Registered Dietitian [phone] (pager)

## 2018-04-18 ENCOUNTER — Ambulatory Visit
Admission: RE | Admit: 2018-04-18 | Discharge: 2018-04-18 | Disposition: A | Discharge status: Home / Self Care | Payer: Medicare Other | Source: Ambulatory Visit | Attending: Radiation Oncology | Admitting: Radiation Oncology

## 2018-04-18 DIAGNOSIS — C3411 Malignant neoplasm of upper lobe, right bronchus or lung: Secondary | ICD-10-CM | POA: Diagnosis not present

## 2018-04-18 DIAGNOSIS — I10 Essential (primary) hypertension: Secondary | ICD-10-CM | POA: Diagnosis not present

## 2018-04-18 DIAGNOSIS — K7689 Other specified diseases of liver: Secondary | ICD-10-CM | POA: Diagnosis not present

## 2018-04-18 DIAGNOSIS — Z510 Encounter for antineoplastic radiation therapy: Secondary | ICD-10-CM | POA: Diagnosis not present

## 2018-04-18 DIAGNOSIS — J449 Chronic obstructive pulmonary disease, unspecified: Secondary | ICD-10-CM | POA: Diagnosis not present

## 2018-04-18 DIAGNOSIS — Z853 Personal history of malignant neoplasm of breast: Secondary | ICD-10-CM | POA: Diagnosis not present

## 2018-04-18 DIAGNOSIS — Z87891 Personal history of nicotine dependence: Secondary | ICD-10-CM | POA: Diagnosis not present

## 2018-04-19 ENCOUNTER — Ambulatory Visit: Payer: Medicare Other

## 2018-04-20 ENCOUNTER — Ambulatory Visit
Admission: RE | Admit: 2018-04-20 | Discharge: 2018-04-20 | Disposition: A | Discharge status: Home / Self Care | Payer: Medicare Other | Source: Ambulatory Visit | Attending: Radiation Oncology | Admitting: Radiation Oncology

## 2018-04-20 DIAGNOSIS — K7689 Other specified diseases of liver: Secondary | ICD-10-CM | POA: Diagnosis not present

## 2018-04-20 DIAGNOSIS — C3411 Malignant neoplasm of upper lobe, right bronchus or lung: Secondary | ICD-10-CM | POA: Diagnosis not present

## 2018-04-20 DIAGNOSIS — J449 Chronic obstructive pulmonary disease, unspecified: Secondary | ICD-10-CM | POA: Diagnosis not present

## 2018-04-20 DIAGNOSIS — Z853 Personal history of malignant neoplasm of breast: Secondary | ICD-10-CM | POA: Diagnosis not present

## 2018-04-20 DIAGNOSIS — I10 Essential (primary) hypertension: Secondary | ICD-10-CM | POA: Diagnosis not present

## 2018-04-20 DIAGNOSIS — Z510 Encounter for antineoplastic radiation therapy: Secondary | ICD-10-CM | POA: Diagnosis not present

## 2018-04-21 ENCOUNTER — Ambulatory Visit
Admission: RE | Admit: 2018-04-21 | Discharge: 2018-04-21 | Disposition: A | Discharge status: Home / Self Care | Payer: Medicare Other | Source: Ambulatory Visit | Attending: Radiation Oncology | Admitting: Radiation Oncology

## 2018-04-21 DIAGNOSIS — Z853 Personal history of malignant neoplasm of breast: Secondary | ICD-10-CM | POA: Diagnosis not present

## 2018-04-21 DIAGNOSIS — I10 Essential (primary) hypertension: Secondary | ICD-10-CM | POA: Diagnosis not present

## 2018-04-21 DIAGNOSIS — Z510 Encounter for antineoplastic radiation therapy: Secondary | ICD-10-CM | POA: Diagnosis not present

## 2018-04-21 DIAGNOSIS — K7689 Other specified diseases of liver: Secondary | ICD-10-CM | POA: Diagnosis not present

## 2018-04-21 DIAGNOSIS — J449 Chronic obstructive pulmonary disease, unspecified: Secondary | ICD-10-CM | POA: Diagnosis not present

## 2018-04-21 DIAGNOSIS — C3411 Malignant neoplasm of upper lobe, right bronchus or lung: Secondary | ICD-10-CM | POA: Diagnosis not present

## 2018-04-24 ENCOUNTER — Ambulatory Visit: Payer: Medicare Other

## 2018-04-24 ENCOUNTER — Ambulatory Visit
Admission: RE | Admit: 2018-04-24 | Discharge: 2018-04-24 | Disposition: A | Discharge status: Home / Self Care | Payer: Medicare Other | Source: Ambulatory Visit | Attending: Radiation Oncology | Admitting: Radiation Oncology

## 2018-04-24 ENCOUNTER — Other Ambulatory Visit: Payer: Medicare Other

## 2018-04-24 DIAGNOSIS — Z510 Encounter for antineoplastic radiation therapy: Secondary | ICD-10-CM | POA: Diagnosis not present

## 2018-04-24 DIAGNOSIS — C3411 Malignant neoplasm of upper lobe, right bronchus or lung: Secondary | ICD-10-CM | POA: Insufficient documentation

## 2018-04-24 DIAGNOSIS — Z87891 Personal history of nicotine dependence: Secondary | ICD-10-CM | POA: Diagnosis not present

## 2018-04-25 ENCOUNTER — Other Ambulatory Visit: Payer: Self-pay | Admitting: Internal Medicine

## 2018-04-25 ENCOUNTER — Inpatient Hospital Stay: Payer: Medicare Other

## 2018-04-25 ENCOUNTER — Ambulatory Visit
Admission: RE | Admit: 2018-04-25 | Discharge: 2018-04-25 | Disposition: A | Discharge status: Home / Self Care | Payer: Medicare Other | Source: Ambulatory Visit | Attending: Radiation Oncology | Admitting: Radiation Oncology

## 2018-04-25 ENCOUNTER — Inpatient Hospital Stay: Payer: Medicare Other | Attending: Internal Medicine

## 2018-04-25 VITALS — BP 123/73 | HR 85 | Temp 96.7°F | Resp 18 | Wt 143.2 lb

## 2018-04-25 DIAGNOSIS — J449 Chronic obstructive pulmonary disease, unspecified: Secondary | ICD-10-CM | POA: Insufficient documentation

## 2018-04-25 DIAGNOSIS — C3411 Malignant neoplasm of upper lobe, right bronchus or lung: Secondary | ICD-10-CM | POA: Diagnosis not present

## 2018-04-25 DIAGNOSIS — Z853 Personal history of malignant neoplasm of breast: Secondary | ICD-10-CM | POA: Diagnosis not present

## 2018-04-25 DIAGNOSIS — Z510 Encounter for antineoplastic radiation therapy: Secondary | ICD-10-CM | POA: Diagnosis not present

## 2018-04-25 DIAGNOSIS — Z87891 Personal history of nicotine dependence: Secondary | ICD-10-CM | POA: Diagnosis not present

## 2018-04-25 DIAGNOSIS — Z95828 Presence of other vascular implants and grafts: Secondary | ICD-10-CM

## 2018-04-25 DIAGNOSIS — Z5111 Encounter for antineoplastic chemotherapy: Secondary | ICD-10-CM | POA: Insufficient documentation

## 2018-04-25 LAB — CBC WITH DIFFERENTIAL/PLATELET
BASOS PCT: 1 %
Basophils Absolute: 0 10*3/uL (ref 0–0.1)
EOS ABS: 0.1 10*3/uL (ref 0–0.7)
Eosinophils Relative: 3 %
HCT: 31.5 % — ABNORMAL LOW (ref 35.0–47.0)
Hemoglobin: 10.4 g/dL — ABNORMAL LOW (ref 12.0–16.0)
LYMPHS ABS: 0.4 10*3/uL — AB (ref 1.0–3.6)
Lymphocytes Relative: 10 %
MCH: 28.6 pg (ref 26.0–34.0)
MCHC: 33 g/dL (ref 32.0–36.0)
MCV: 86.7 fL (ref 80.0–100.0)
MONOS PCT: 10 %
Monocytes Absolute: 0.5 10*3/uL (ref 0.2–0.9)
Neutro Abs: 3.3 10*3/uL (ref 1.4–6.5)
Neutrophils Relative %: 76 %
Platelets: 256 10*3/uL (ref 150–440)
RBC: 3.64 MIL/uL — AB (ref 3.80–5.20)
RDW: 20.1 % — ABNORMAL HIGH (ref 11.5–14.5)
WBC: 4.4 10*3/uL (ref 3.6–11.0)

## 2018-04-25 LAB — COMPREHENSIVE METABOLIC PANEL
ALK PHOS: 64 U/L (ref 38–126)
ALT: 18 U/L (ref 0–44)
AST: 19 U/L (ref 15–41)
Albumin: 3.8 g/dL (ref 3.5–5.0)
Anion gap: 9 (ref 5–15)
BUN: 22 mg/dL (ref 8–23)
CALCIUM: 9.5 mg/dL (ref 8.9–10.3)
CHLORIDE: 107 mmol/L (ref 98–111)
CO2: 23 mmol/L (ref 22–32)
CREATININE: 1.18 mg/dL — AB (ref 0.44–1.00)
GFR, EST AFRICAN AMERICAN: 49 mL/min — AB (ref >60)
GFR, EST NON AFRICAN AMERICAN: 43 mL/min — AB (ref >60)
Glucose, Bld: 91 mg/dL (ref 70–99)
Potassium: 4 mmol/L (ref 3.5–5.1)
Sodium: 139 mmol/L (ref 135–145)
Total Bilirubin: 0.5 mg/dL (ref 0.3–1.2)
Total Protein: 7 g/dL (ref 6.5–8.1)

## 2018-04-25 MED ORDER — HEPARIN SOD (PORK) LOCK FLUSH 100 UNIT/ML IV SOLN
500.0000 [IU] | Freq: Once | INTRAVENOUS | Status: AC
  Administered 2018-04-25: 500 [IU] via INTRAVENOUS
  Filled 2018-04-25: qty 5

## 2018-04-25 MED ORDER — SODIUM CHLORIDE 0.9 % IV SOLN
130.8000 mg | Freq: Once | INTRAVENOUS | Status: AC
  Administered 2018-04-25: 130 mg via INTRAVENOUS
  Filled 2018-04-25: qty 13

## 2018-04-25 MED ORDER — SODIUM CHLORIDE 0.9 % IV SOLN
Freq: Once | INTRAVENOUS | Status: AC
  Administered 2018-04-25: 10:00:00 via INTRAVENOUS
  Filled 2018-04-25: qty 1000

## 2018-04-25 MED ORDER — PALONOSETRON HCL INJECTION 0.25 MG/5ML
0.2500 mg | Freq: Once | INTRAVENOUS | Status: AC
  Administered 2018-04-25: 0.25 mg via INTRAVENOUS
  Filled 2018-04-25: qty 5

## 2018-04-25 MED ORDER — PACLITAXEL CHEMO INJECTION 300 MG/50ML
45.0000 mg/m2 | Freq: Once | INTRAVENOUS | Status: AC
  Administered 2018-04-25: 78 mg via INTRAVENOUS
  Filled 2018-04-25: qty 13

## 2018-04-25 MED ORDER — SODIUM CHLORIDE 0.9 % IV SOLN
20.0000 mg | Freq: Once | INTRAVENOUS | Status: AC
  Administered 2018-04-25: 20 mg via INTRAVENOUS
  Filled 2018-04-25: qty 2

## 2018-04-25 MED ORDER — DIPHENHYDRAMINE HCL 50 MG/ML IJ SOLN
25.0000 mg | Freq: Once | INTRAMUSCULAR | Status: AC
  Administered 2018-04-25: 25 mg via INTRAVENOUS
  Filled 2018-04-25: qty 1

## 2018-04-25 MED ORDER — SODIUM CHLORIDE 0.9% FLUSH
10.0000 mL | INTRAVENOUS | Status: DC | PRN
  Administered 2018-04-25: 10 mL via INTRAVENOUS
  Filled 2018-04-25: qty 10

## 2018-04-25 MED ORDER — FAMOTIDINE IN NACL 20-0.9 MG/50ML-% IV SOLN
20.0000 mg | Freq: Once | INTRAVENOUS | Status: AC
  Administered 2018-04-25: 20 mg via INTRAVENOUS
  Filled 2018-04-25: qty 50

## 2018-04-26 ENCOUNTER — Ambulatory Visit
Admission: RE | Admit: 2018-04-26 | Discharge: 2018-04-26 | Disposition: A | Discharge status: Home / Self Care | Payer: Medicare Other | Source: Ambulatory Visit | Attending: Radiation Oncology | Admitting: Radiation Oncology

## 2018-04-26 DIAGNOSIS — Z510 Encounter for antineoplastic radiation therapy: Secondary | ICD-10-CM | POA: Diagnosis not present

## 2018-04-26 DIAGNOSIS — Z87891 Personal history of nicotine dependence: Secondary | ICD-10-CM | POA: Diagnosis not present

## 2018-04-26 DIAGNOSIS — C3411 Malignant neoplasm of upper lobe, right bronchus or lung: Secondary | ICD-10-CM | POA: Diagnosis not present

## 2018-04-28 ENCOUNTER — Ambulatory Visit
Admission: RE | Admit: 2018-04-28 | Discharge: 2018-04-28 | Disposition: A | Discharge status: Home / Self Care | Payer: Medicare Other | Source: Ambulatory Visit | Attending: Radiation Oncology | Admitting: Radiation Oncology

## 2018-04-28 DIAGNOSIS — Z510 Encounter for antineoplastic radiation therapy: Secondary | ICD-10-CM | POA: Diagnosis not present

## 2018-04-28 DIAGNOSIS — C3411 Malignant neoplasm of upper lobe, right bronchus or lung: Secondary | ICD-10-CM | POA: Diagnosis not present

## 2018-04-28 DIAGNOSIS — Z87891 Personal history of nicotine dependence: Secondary | ICD-10-CM | POA: Diagnosis not present

## 2018-05-01 ENCOUNTER — Ambulatory Visit
Admission: RE | Admit: 2018-05-01 | Discharge: 2018-05-01 | Disposition: A | Discharge status: Home / Self Care | Payer: Medicare Other | Source: Ambulatory Visit | Attending: Radiation Oncology | Admitting: Radiation Oncology

## 2018-05-01 ENCOUNTER — Encounter (INDEPENDENT_AMBULATORY_CARE_PROVIDER_SITE_OTHER): Payer: Self-pay

## 2018-05-01 ENCOUNTER — Inpatient Hospital Stay: Payer: Medicare Other

## 2018-05-01 ENCOUNTER — Inpatient Hospital Stay (HOSPITAL_BASED_OUTPATIENT_CLINIC_OR_DEPARTMENT_OTHER): Payer: Medicare Other | Admitting: Internal Medicine

## 2018-05-01 ENCOUNTER — Encounter: Payer: Self-pay | Admitting: Internal Medicine

## 2018-05-01 VITALS — BP 122/80 | HR 92 | Temp 97.1°F | Resp 16 | Wt 142.8 lb

## 2018-05-01 DIAGNOSIS — Z510 Encounter for antineoplastic radiation therapy: Secondary | ICD-10-CM | POA: Diagnosis not present

## 2018-05-01 DIAGNOSIS — C3411 Malignant neoplasm of upper lobe, right bronchus or lung: Secondary | ICD-10-CM

## 2018-05-01 DIAGNOSIS — J449 Chronic obstructive pulmonary disease, unspecified: Secondary | ICD-10-CM | POA: Diagnosis not present

## 2018-05-01 DIAGNOSIS — Z853 Personal history of malignant neoplasm of breast: Secondary | ICD-10-CM

## 2018-05-01 DIAGNOSIS — Z87891 Personal history of nicotine dependence: Secondary | ICD-10-CM | POA: Diagnosis not present

## 2018-05-01 DIAGNOSIS — Z5111 Encounter for antineoplastic chemotherapy: Secondary | ICD-10-CM | POA: Diagnosis not present

## 2018-05-01 LAB — CBC WITH DIFFERENTIAL/PLATELET
Basophils Absolute: 0 10*3/uL (ref 0–0.1)
Basophils Relative: 1 %
Eosinophils Absolute: 0.1 10*3/uL (ref 0–0.7)
Eosinophils Relative: 2 %
HCT: 31.7 % — ABNORMAL LOW (ref 35.0–47.0)
Hemoglobin: 10.6 g/dL — ABNORMAL LOW (ref 12.0–16.0)
LYMPHS ABS: 0.4 10*3/uL — AB (ref 1.0–3.6)
LYMPHS PCT: 10 %
MCH: 29 pg (ref 26.0–34.0)
MCHC: 33.4 g/dL (ref 32.0–36.0)
MCV: 86.7 fL (ref 80.0–100.0)
MONOS PCT: 8 %
Monocytes Absolute: 0.3 10*3/uL (ref 0.2–0.9)
NEUTROS ABS: 3.1 10*3/uL (ref 1.4–6.5)
NEUTROS PCT: 79 %
Platelets: 270 10*3/uL (ref 150–440)
RBC: 3.65 MIL/uL — AB (ref 3.80–5.20)
RDW: 21.4 % — AB (ref 11.5–14.5)
WBC: 4 10*3/uL (ref 3.6–11.0)

## 2018-05-01 LAB — COMPREHENSIVE METABOLIC PANEL
ALT: 19 U/L (ref 0–44)
ANION GAP: 5 (ref 5–15)
AST: 20 U/L (ref 15–41)
Albumin: 3.4 g/dL — ABNORMAL LOW (ref 3.5–5.0)
Alkaline Phosphatase: 69 U/L (ref 38–126)
BUN: 20 mg/dL (ref 8–23)
CO2: 22 mmol/L (ref 22–32)
Calcium: 9.3 mg/dL (ref 8.9–10.3)
Chloride: 109 mmol/L (ref 98–111)
Creatinine, Ser: 1.11 mg/dL — ABNORMAL HIGH (ref 0.44–1.00)
GFR calc non Af Amer: 46 mL/min — ABNORMAL LOW (ref >60)
GFR, EST AFRICAN AMERICAN: 53 mL/min — AB (ref >60)
Glucose, Bld: 92 mg/dL (ref 70–99)
POTASSIUM: 4.1 mmol/L (ref 3.5–5.1)
SODIUM: 136 mmol/L (ref 135–145)
Total Bilirubin: 0.5 mg/dL (ref 0.3–1.2)
Total Protein: 6.9 g/dL (ref 6.5–8.1)

## 2018-05-01 MED ORDER — HEPARIN SOD (PORK) LOCK FLUSH 100 UNIT/ML IV SOLN
500.0000 [IU] | Freq: Once | INTRAVENOUS | Status: AC
  Administered 2018-05-01: 500 [IU] via INTRAVENOUS
  Filled 2018-05-01: qty 5

## 2018-05-01 MED ORDER — PALONOSETRON HCL INJECTION 0.25 MG/5ML
0.2500 mg | Freq: Once | INTRAVENOUS | Status: AC
Start: 2018-05-01 — End: 2018-05-01
  Administered 2018-05-01: 0.25 mg via INTRAVENOUS
  Filled 2018-05-01: qty 5

## 2018-05-01 MED ORDER — DEXAMETHASONE SODIUM PHOSPHATE 100 MG/10ML IJ SOLN
20.0000 mg | Freq: Once | INTRAMUSCULAR | Status: AC
  Administered 2018-05-01: 20 mg via INTRAVENOUS
  Filled 2018-05-01: qty 2

## 2018-05-01 MED ORDER — FAMOTIDINE IN NACL 20-0.9 MG/50ML-% IV SOLN
20.0000 mg | Freq: Once | INTRAVENOUS | Status: AC
Start: 2018-05-01 — End: 2018-05-01
  Administered 2018-05-01: 20 mg via INTRAVENOUS
  Filled 2018-05-01: qty 50

## 2018-05-01 MED ORDER — SODIUM CHLORIDE 0.9 % IV SOLN
Freq: Once | INTRAVENOUS | Status: AC
Start: 2018-05-01 — End: 2018-05-01
  Administered 2018-05-01: 10:00:00 via INTRAVENOUS
  Filled 2018-05-01: qty 1000

## 2018-05-01 MED ORDER — SODIUM CHLORIDE 0.9 % IV SOLN
45.0000 mg/m2 | Freq: Once | INTRAVENOUS | Status: AC
  Administered 2018-05-01: 78 mg via INTRAVENOUS
  Filled 2018-05-01: qty 13

## 2018-05-01 MED ORDER — DIPHENHYDRAMINE HCL 50 MG/ML IJ SOLN
25.0000 mg | Freq: Once | INTRAMUSCULAR | Status: AC
  Administered 2018-05-01: 25 mg via INTRAVENOUS
  Filled 2018-05-01: qty 1

## 2018-05-01 MED ORDER — SODIUM CHLORIDE 0.9% FLUSH
10.0000 mL | INTRAVENOUS | Status: DC | PRN
  Administered 2018-05-01: 10 mL via INTRAVENOUS
  Filled 2018-05-01: qty 10

## 2018-05-01 MED ORDER — SODIUM CHLORIDE 0.9 % IV SOLN
130.0000 mg | Freq: Once | INTRAVENOUS | Status: AC
  Administered 2018-05-01: 130 mg via INTRAVENOUS
  Filled 2018-05-01: qty 13

## 2018-05-01 NOTE — Assessment & Plan Note (Addendum)
#  Right upper lobe lung cancer-squamous cell-stage III/ unresectable; on carboplatin Taxol with radiation-we will finished radiation on June 17.  #Continue carbotaxol with radiation; Labs today reviewed;  acceptable for treatment today.  Long discussion regarding the role for sensitizing chemotherapy with radiation; tolerating treatments fairly well I would recommend proceeding with carbotaxol again next week.   #  Discussed that in general would recommend repeat imaging after finishing chemoradiation sometime in mid Aug; followed by maintenance immunotherapy- like Imfinzi.   #COPD-stable.  Continue inhalers.  # Fatigue-grade 1-2 stable.  # lab-taxol in 1 week; labs/crabo-taxol; follow up in 1st week of aug 2019/labs.  Will order imaging at that time prior to proceeding with chemotherapy.

## 2018-05-01 NOTE — Progress Notes (Signed)
[place] OFFICE PROGRESS NOTE  Patient Care Team: Leonel Ramsay, MD as PCP - General (Infectious Diseases) Telford Nab, RN as Registered Nurse  Cancer Staging No matching staging information was found for the patient.   Oncology History   # RUL LUNG CANCER-non-small cell; favor squamous cell; PET scan T4N0 [right upper lobe mass involving[mediastinal/blood results] vs small pleural effusion [?M1]   # Hx of Right breast ca [s/p lumpec; RT; No chemo; "pill" x5 years]  # COPD/Hx of smoking  MOLECULAR TESTING/OMNISEQ: PDL-1 25% [TPS; TMB-H; No targets**]  --------------------------------------------------    DIAGNOSIS: [ April 2019]SQUAMOUS CELL LUNG CA  STAGE: III ;GOALS: CURATIVE  CURRENT/MOST RECENT THERAPY [ May 2019]- Carbo-taxol with RT .       Primary cancer of right upper lobe of lung ([place])   03/06/2018 -  Chemotherapy    The patient had palonosetron (ALOXI) injection 0.25 mg, 0.25 mg, Intravenous,  Once, 8 of 9 cycles Administration: 0.25 mg (03/13/2018), 0.25 mg (04/10/2018), 0.25 mg (04/17/2018), 0.25 mg (03/21/2018), 0.25 mg (04/25/2018), 0.25 mg (03/27/2018), 0.25 mg (04/03/2018), 0.25 mg (05/01/2018) CARBOplatin (PARAPLATIN) 130 mg in sodium chloride 0.9 % 250 mL chemo infusion, 130 mg (100 % of original dose 131.4 mg), Intravenous,  Once, 8 of 9 cycles Dose modification:   (original dose 131.4 mg, Cycle 1) Administration: 130 mg (03/13/2018), 130 mg (04/10/2018), 130 mg (04/17/2018), 130 mg (03/21/2018), 130 mg (04/25/2018), 130 mg (03/27/2018), 130 mg (04/03/2018), 130 mg (05/01/2018) PACLitaxel (TAXOL) 78 mg in sodium chloride 0.9 % 250 mL chemo infusion (</= 77m/m2), 45 mg/m2 = 78 mg, Intravenous,  Once, 8 of 9 cycles Administration: 78 mg (03/13/2018), 78 mg (04/10/2018), 78 mg (04/17/2018), 78 mg (03/21/2018), 78 mg (04/25/2018), 78 mg (03/27/2018), 78 mg (04/03/2018), 78 mg (05/01/2018)  for chemotherapy treatment.          INTERVAL HISTORY:  Brenda Parker 82y.o.  female pleasant patient above history of stage III lung cancer chemoradiation for follow-up.  Patient complains of mild fatigue.  Denies any worsening shortness of breath or cough.  Review of Systems  Constitutional: Positive for malaise/fatigue. Negative for chills, diaphoresis, fever and weight loss.  HENT: Negative for nosebleeds and sore throat.   Eyes: Negative for double vision.  Respiratory: Negative for cough, hemoptysis, sputum production, shortness of breath and wheezing.   Cardiovascular: Negative for chest pain, palpitations, orthopnea and leg swelling.  Gastrointestinal: Negative for abdominal pain, blood in stool, constipation, diarrhea, heartburn, melena, nausea and vomiting.  Genitourinary: Negative for dysuria, frequency and urgency.  Musculoskeletal: Negative for back pain and joint pain.  Skin: Negative.  Negative for itching and rash.  Neurological: Negative for dizziness, tingling, focal weakness, weakness and headaches.  Endo/Heme/Allergies: Does not bruise/bleed easily.  Psychiatric/Behavioral: Negative for depression. The patient is not nervous/anxious and does not have insomnia.       PAST MEDICAL HISTORY :  Past Medical History:  Diagnosis Date  . Benign liver cyst   . [place]   [place]  . [place] disease[place]   . Dyspnea   . Glaucoma   . Hemoptysis 2019  . Hypertension    Not on medication for htn. patient denies this  . Lung mass 01/2018  . Personal history of radiation therapy     PAST SURGICAL HISTORY :   Past Surgical History:  Procedure Laterality Date  . BREAST EXCISIONAL BIOPSY Right 2005   positive, radiation  . CHOLECYSTECTOMY  [place]  2012  . ENDOBRONCHIAL ULTRASOUND N/A 02/21/2018   Procedure: ENDOBRONCHIAL ULTRASOUND;  Surgeon: Laverle Hobby, MD;  Location: [facility] ORS;  Service: Pulmonary;  Laterality: N/A;  . EYE SURGERY Bilateral 2009,2010   cataract  extractions with lens implant  . GALLBLADDER SURGERY Bilateral   . IR FLUORO GUIDE PORT INSERTION RIGHT  03/10/2018  . JOINT REPLACEMENT Left 2008   left hip replacement  . PORTACATH PLACEMENT  03/10/2018  . TONSILLECTOMY  1960    FAMILY HISTORY :   Family History  Problem Relation Age of Onset  . Breast cancer Mother 25    SOCIAL HISTORY:   Social History   Tobacco Use  . Smoking status: Former Smoker    Packs/day: 1.00    Types: Cigarettes    Last attempt to quit: 10/25/2005    Years since quitting: 12.5  . Smokeless tobacco: Never Used  Substance Use Topics  . Alcohol use: Not Currently    Comment: occasional glass of beer  . Drug use: Never    ALLERGIES:  is allergic to penicillins.  MEDICATIONS:  Current Outpatient Medications  Medication Sig Dispense Refill  . albuterol (PROVENTIL HFA) 108 (90 Base) MCG/ACT inhaler Inhale 2 puffs into the lungs every 6 (six) hours as needed for wheezing or shortness of breath.     Marland Kitchen aspirin EC 81 MG tablet Take 81 mg by mouth daily.    . Cholecalciferol (VITAMIN D3) 3000 units TABS Take 3,000 Units by mouth daily.     . Fluticasone-Salmeterol (ADVAIR DISKUS) 500-50 MCG/DOSE AEPB Inhale 1 puff into the lungs 2 (two) times daily. 3 each 0  . latanoprost (XALATAN) 0.005 % ophthalmic solution Place 1 drop into both eyes at bedtime.    . lidocaine-prilocaine (EMLA) cream Apply 1 application topically as needed. To port a cath site 30 g 6  . ondansetron (ZOFRAN) 8 MG tablet Take 1 tablet (8 mg total) by mouth every 8 (eight) hours as needed for nausea or vomiting. 20 tablet 3  . prochlorperazine (COMPAZINE) 10 MG tablet Take 1 tablet (10 mg total) by mouth every 6 (six) hours as needed for nausea or vomiting. 30 tablet 3   No current facility-administered medications for this visit.     PHYSICAL EXAMINATION: ECOG PERFORMANCE STATUS: 0 - Asymptomatic  BP 122/80 (BP Location: Left Arm, Patient Position: Sitting)   Pulse 92   Temp (!)  97.1 F (36.2 C) (Tympanic)   Resp 16   Wt 142 lb 12.8 oz (64.8 kg)   BMI 24.51 kg/m   Filed Weights   05/01/18 0935  Weight: 142 lb 12.8 oz (64.8 kg)    GENERAL: Well-nourished well-developed; Alert, no distress and comfortable. Accompanied by family.  EYES: no pallor or icterus OROPHARYNX: no thrush or ulceration; NECK: supple; no lymph nodes felt. LYMPH:  no palpable lymphadenopathy in the axillary or inguinal regions LUNGS: Decreased breath sounds auscultation bilaterally. No wheeze or crackles HEART/CVS: regular rate & rhythm and no murmurs; No lower extremity edema ABDOMEN:abdomen soft, non-tender and normal bowel sounds. No hepatomegaly or splenomegaly.  Musculoskeletal:no cyanosis of digits and no clubbing  PSYCH: alert & oriented x 3 with fluent speech NEURO: no focal motor/sensory deficits SKIN:  no rashes or significant lesions    LABORATORY DATA:  I have reviewed the data as listed    Component Value Date/Time   NA 136 05/01/2018 0916   K 4.1 05/01/2018 0916   CL 109 05/01/2018 0916   CO2  22 05/01/2018 0916   GLUCOSE 92 05/01/2018 0916   BUN 20 05/01/2018 0916   CREATININE 1.11 (H) 05/01/2018 0916   CALCIUM 9.3 05/01/2018 0916   PROT 6.9 05/01/2018 0916   ALBUMIN 3.4 (L) 05/01/2018 0916   AST 20 05/01/2018 0916   ALT 19 05/01/2018 0916   ALKPHOS 69 05/01/2018 0916   BILITOT 0.5 05/01/2018 0916   GFRNONAA 46 (L) 05/01/2018 0916   GFRAA 53 (L) 05/01/2018 0916    No results found for: SPEP, UPEP  Lab Results  Component Value Date   WBC 4.0 05/01/2018   NEUTROABS 3.1 05/01/2018   HGB 10.6 (L) 05/01/2018   HCT 31.7 (L) 05/01/2018   MCV 86.7 05/01/2018   PLT 270 05/01/2018      Chemistry      Component Value Date/Time   NA 136 05/01/2018 0916   K 4.1 05/01/2018 0916   CL 109 05/01/2018 0916   CO2 22 05/01/2018 0916   BUN 20 05/01/2018 0916   CREATININE 1.11 (H) 05/01/2018 0916      Component Value Date/Time   CALCIUM 9.3 05/01/2018 0916    ALKPHOS 69 05/01/2018 0916   AST 20 05/01/2018 0916   ALT 19 05/01/2018 0916   BILITOT 0.5 05/01/2018 0916       RADIOGRAPHIC STUDIES: I have personally reviewed the radiological images as listed and agreed with the findings in the report. No results found.   ASSESSMENT & PLAN:  Primary cancer of right upper lobe of lung ([place]) #Right upper lobe lung cancer-squamous cell-stage III/ unresectable; on carboplatin Taxol with radiation-we will finished radiation on June 17.  #Continue carbotaxol with radiation; Labs today reviewed;  acceptable for treatment today.  Long discussion regarding the role for sensitizing chemotherapy with radiation; tolerating treatments fairly well I would recommend proceeding with carbotaxol again next week.   #  Discussed that in general would recommend repeat imaging after finishing chemoradiation sometime in mid Aug; followed by maintenance immunotherapy- like Imfinzi.   #COPD-stable.  Continue inhalers.  # Fatigue-grade 1-2 stable.  # lab-taxol in 1 week; labs/crabo-taxol; follow up in 1st week of aug 2019/labs.  Will order imaging at that time prior to proceeding with chemotherapy.   Orders Placed This Encounter  Procedures  . CBC with Differential    Standing Status:   Future    Standing Expiration Date:   05/01/2019  . CBC with Differential    Standing Status:   Future    Standing Expiration Date:   05/01/2019  . Basic metabolic panel    Standing Status:   Future    Standing Expiration Date:   05/01/2019   All questions were answered. The patient knows to call the clinic with any problems, questions or concerns.      [name], MD 05/01/2018 8:46 PM

## 2018-05-02 ENCOUNTER — Ambulatory Visit
Admission: RE | Admit: 2018-05-02 | Discharge: 2018-05-02 | Disposition: A | Discharge status: Home / Self Care | Payer: Medicare Other | Source: Ambulatory Visit | Attending: Radiation Oncology | Admitting: Radiation Oncology

## 2018-05-02 DIAGNOSIS — Z87891 Personal history of nicotine dependence: Secondary | ICD-10-CM | POA: Diagnosis not present

## 2018-05-02 DIAGNOSIS — C3411 Malignant neoplasm of upper lobe, right bronchus or lung: Secondary | ICD-10-CM | POA: Diagnosis not present

## 2018-05-02 DIAGNOSIS — Z510 Encounter for antineoplastic radiation therapy: Secondary | ICD-10-CM | POA: Diagnosis not present

## 2018-05-03 ENCOUNTER — Ambulatory Visit
Admission: RE | Admit: 2018-05-03 | Discharge: 2018-05-03 | Disposition: A | Discharge status: Home / Self Care | Payer: Medicare Other | Source: Ambulatory Visit | Attending: Radiation Oncology | Admitting: Radiation Oncology

## 2018-05-03 ENCOUNTER — Telehealth: Payer: Self-pay | Admitting: *Deleted

## 2018-05-03 DIAGNOSIS — Z87891 Personal history of nicotine dependence: Secondary | ICD-10-CM | POA: Diagnosis not present

## 2018-05-03 DIAGNOSIS — C3411 Malignant neoplasm of upper lobe, right bronchus or lung: Secondary | ICD-10-CM | POA: Diagnosis not present

## 2018-05-03 DIAGNOSIS — Z510 Encounter for antineoplastic radiation therapy: Secondary | ICD-10-CM | POA: Diagnosis not present

## 2018-05-03 NOTE — Telephone Encounter (Signed)
Call attempt made. Unable to leave vm. Phone just rings.

## 2018-05-03 NOTE — Telephone Encounter (Signed)
-----   Message from [place], [place] sent at 05/02/2018  2:45 PM EDT ----- Regarding: Patient call FYI...Marland KitchenMarland KitchenMarland Kitchen  Patient came in and asked to speak with [facility] or Dr. B in regards to some meds. She asked if someone could give her a call @ [phone]  Thanks, Ellison Hughs

## 2018-05-04 ENCOUNTER — Ambulatory Visit
Admission: RE | Admit: 2018-05-04 | Discharge: 2018-05-04 | Disposition: A | Discharge status: Home / Self Care | Payer: Medicare Other | Source: Ambulatory Visit | Attending: Radiation Oncology | Admitting: Radiation Oncology

## 2018-05-04 DIAGNOSIS — C3411 Malignant neoplasm of upper lobe, right bronchus or lung: Secondary | ICD-10-CM | POA: Diagnosis not present

## 2018-05-04 DIAGNOSIS — Z510 Encounter for antineoplastic radiation therapy: Secondary | ICD-10-CM | POA: Diagnosis not present

## 2018-05-04 DIAGNOSIS — Z87891 Personal history of nicotine dependence: Secondary | ICD-10-CM | POA: Diagnosis not present

## 2018-05-05 ENCOUNTER — Ambulatory Visit
Admission: RE | Admit: 2018-05-05 | Discharge: 2018-05-05 | Disposition: A | Discharge status: Home / Self Care | Payer: Medicare Other | Source: Ambulatory Visit | Attending: Radiation Oncology | Admitting: Radiation Oncology

## 2018-05-05 DIAGNOSIS — Z87891 Personal history of nicotine dependence: Secondary | ICD-10-CM | POA: Diagnosis not present

## 2018-05-05 DIAGNOSIS — Z510 Encounter for antineoplastic radiation therapy: Secondary | ICD-10-CM | POA: Diagnosis not present

## 2018-05-05 DIAGNOSIS — C3411 Malignant neoplasm of upper lobe, right bronchus or lung: Secondary | ICD-10-CM | POA: Diagnosis not present

## 2018-05-08 ENCOUNTER — Inpatient Hospital Stay: Payer: Medicare Other

## 2018-05-08 ENCOUNTER — Ambulatory Visit
Admission: RE | Admit: 2018-05-08 | Discharge: 2018-05-08 | Disposition: A | Discharge status: Home / Self Care | Payer: Medicare Other | Source: Ambulatory Visit | Attending: Radiation Oncology | Admitting: Radiation Oncology

## 2018-05-08 VITALS — BP 125/79 | HR 99 | Temp 96.7°F | Resp 18

## 2018-05-08 DIAGNOSIS — Z510 Encounter for antineoplastic radiation therapy: Secondary | ICD-10-CM | POA: Diagnosis not present

## 2018-05-08 DIAGNOSIS — Z5111 Encounter for antineoplastic chemotherapy: Secondary | ICD-10-CM | POA: Diagnosis not present

## 2018-05-08 DIAGNOSIS — Z87891 Personal history of nicotine dependence: Secondary | ICD-10-CM | POA: Diagnosis not present

## 2018-05-08 DIAGNOSIS — Z853 Personal history of malignant neoplasm of breast: Secondary | ICD-10-CM | POA: Diagnosis not present

## 2018-05-08 DIAGNOSIS — C3411 Malignant neoplasm of upper lobe, right bronchus or lung: Secondary | ICD-10-CM | POA: Diagnosis not present

## 2018-05-08 DIAGNOSIS — J449 Chronic obstructive pulmonary disease, unspecified: Secondary | ICD-10-CM | POA: Diagnosis not present

## 2018-05-08 LAB — CBC WITH DIFFERENTIAL/PLATELET
BASOS PCT: 1 %
Basophils Absolute: 0 10*3/uL (ref 0–0.1)
EOS ABS: 0.1 10*3/uL (ref 0–0.7)
EOS PCT: 3 %
HCT: 30 % — ABNORMAL LOW (ref 35.0–47.0)
Hemoglobin: 10.1 g/dL — ABNORMAL LOW (ref 12.0–16.0)
LYMPHS ABS: 0.4 10*3/uL — AB (ref 1.0–3.6)
Lymphocytes Relative: 10 %
MCH: 29.5 pg (ref 26.0–34.0)
MCHC: 33.6 g/dL (ref 32.0–36.0)
MCV: 87.6 fL (ref 80.0–100.0)
MONO ABS: 0.5 10*3/uL (ref 0.2–0.9)
MONOS PCT: 12 %
Neutro Abs: 2.8 10*3/uL (ref 1.4–6.5)
Neutrophils Relative %: 74 %
Platelets: 286 10*3/uL (ref 150–440)
RBC: 3.42 MIL/uL — ABNORMAL LOW (ref 3.80–5.20)
RDW: 23.7 % — AB (ref 11.5–14.5)
WBC: 3.8 10*3/uL (ref 3.6–11.0)

## 2018-05-08 LAB — BASIC METABOLIC PANEL
Anion gap: 6 (ref 5–15)
BUN: 18 mg/dL (ref 8–23)
CALCIUM: 9.4 mg/dL (ref 8.9–10.3)
CHLORIDE: 109 mmol/L (ref 98–111)
CO2: 23 mmol/L (ref 22–32)
CREATININE: 1.12 mg/dL — AB (ref 0.44–1.00)
GFR calc non Af Amer: 45 mL/min — ABNORMAL LOW (ref >60)
GFR, EST AFRICAN AMERICAN: 52 mL/min — AB (ref >60)
Glucose, Bld: 94 mg/dL (ref 70–99)
Potassium: 3.9 mmol/L (ref 3.5–5.1)
SODIUM: 138 mmol/L (ref 135–145)

## 2018-05-08 MED ORDER — DIPHENHYDRAMINE HCL 50 MG/ML IJ SOLN
25.0000 mg | Freq: Once | INTRAMUSCULAR | Status: AC
  Administered 2018-05-08: 25 mg via INTRAVENOUS
  Filled 2018-05-08: qty 1

## 2018-05-08 MED ORDER — SODIUM CHLORIDE 0.9 % IV SOLN
133.8000 mg | Freq: Once | INTRAVENOUS | Status: AC
  Administered 2018-05-08: 130 mg via INTRAVENOUS
  Filled 2018-05-08: qty 13

## 2018-05-08 MED ORDER — PACLITAXEL CHEMO INJECTION 300 MG/50ML
45.0000 mg/m2 | Freq: Once | INTRAVENOUS | Status: AC
  Administered 2018-05-08: 78 mg via INTRAVENOUS
  Filled 2018-05-08: qty 13

## 2018-05-08 MED ORDER — SODIUM CHLORIDE 0.9% FLUSH
10.0000 mL | Freq: Once | INTRAVENOUS | Status: AC
  Administered 2018-05-08: 10 mL via INTRAVENOUS
  Filled 2018-05-08: qty 10

## 2018-05-08 MED ORDER — SODIUM CHLORIDE 0.9 % IV SOLN
Freq: Once | INTRAVENOUS | Status: AC
Start: 2018-05-08 — End: 2018-05-08
  Administered 2018-05-08: 09:00:00 via INTRAVENOUS
  Filled 2018-05-08: qty 1000

## 2018-05-08 MED ORDER — SODIUM CHLORIDE 0.9 % IV SOLN
20.0000 mg | Freq: Once | INTRAVENOUS | Status: AC
  Administered 2018-05-08: 20 mg via INTRAVENOUS
  Filled 2018-05-08: qty 2

## 2018-05-08 MED ORDER — PALONOSETRON HCL INJECTION 0.25 MG/5ML
0.2500 mg | Freq: Once | INTRAVENOUS | Status: AC
  Administered 2018-05-08: 0.25 mg via INTRAVENOUS
  Filled 2018-05-08: qty 5

## 2018-05-08 MED ORDER — HEPARIN SOD (PORK) LOCK FLUSH 100 UNIT/ML IV SOLN
500.0000 [IU] | Freq: Once | INTRAVENOUS | Status: AC
  Administered 2018-05-08: 500 [IU] via INTRAVENOUS
  Filled 2018-05-08: qty 5

## 2018-05-08 MED ORDER — FAMOTIDINE IN NACL 20-0.9 MG/50ML-% IV SOLN
20.0000 mg | Freq: Once | INTRAVENOUS | Status: AC
  Administered 2018-05-08: 20 mg via INTRAVENOUS
  Filled 2018-05-08: qty 50

## 2018-05-08 NOTE — Progress Notes (Signed)
Nutrition Follow-up:  Patient with lung cancer followed by Dr. Jacinto Reap  Met with patient during infusion.  Patient continues to report good appetite.  Not drinking oral nutrition supplement shakes.  Reports yesterday ate frosted flakes with milk, roast beef sandwich for lunch with chips and cheerwine. Supper was lo mein with pork, ice cream and chocolate chip cookies.    Denies nutrition impact symptoms.      Medications: reviewed  Labs: reviewed  Anthropometrics:   Weight 142 lb 12.8 oz on 7/8 slight decrease from 145 lb on 6/24.   NUTRITION DIAGNOSIS: Inadequate oral intake improving   INTERVENTION:  Encouraged patient to continue good sources of protein and high calorie foods to continue weight gain.    MONITORING, EVALUATION, GOAL: Patient will consume adequate calories and protein to meet nutritional needs   NEXT VISIT: as needed  Brenda Parker B. Zenia Resides, [place], [place] Registered Dietitian [phone] (pager)

## 2018-05-08 NOTE — Progress Notes (Signed)
Proceed with treatment today per Dr. Rogue Bussing.

## 2018-05-09 ENCOUNTER — Ambulatory Visit
Admission: RE | Admit: 2018-05-09 | Discharge: 2018-05-09 | Disposition: A | Discharge status: Home / Self Care | Payer: Medicare Other | Source: Ambulatory Visit | Attending: Radiation Oncology | Admitting: Radiation Oncology

## 2018-05-09 DIAGNOSIS — Z87891 Personal history of nicotine dependence: Secondary | ICD-10-CM | POA: Diagnosis not present

## 2018-05-09 DIAGNOSIS — C3411 Malignant neoplasm of upper lobe, right bronchus or lung: Secondary | ICD-10-CM | POA: Diagnosis not present

## 2018-05-09 DIAGNOSIS — Z510 Encounter for antineoplastic radiation therapy: Secondary | ICD-10-CM | POA: Diagnosis not present

## 2018-05-10 ENCOUNTER — Ambulatory Visit
Admission: RE | Admit: 2018-05-10 | Discharge: 2018-05-10 | Disposition: A | Discharge status: Home / Self Care | Payer: Medicare Other | Source: Ambulatory Visit | Attending: Radiation Oncology | Admitting: Radiation Oncology

## 2018-05-10 ENCOUNTER — Ambulatory Visit: Payer: Medicare Other

## 2018-05-10 DIAGNOSIS — Z510 Encounter for antineoplastic radiation therapy: Secondary | ICD-10-CM | POA: Diagnosis not present

## 2018-05-10 DIAGNOSIS — Z87891 Personal history of nicotine dependence: Secondary | ICD-10-CM | POA: Diagnosis not present

## 2018-05-10 DIAGNOSIS — C3411 Malignant neoplasm of upper lobe, right bronchus or lung: Secondary | ICD-10-CM | POA: Diagnosis not present

## 2018-05-11 ENCOUNTER — Ambulatory Visit
Admission: RE | Admit: 2018-05-11 | Discharge: 2018-05-11 | Disposition: A | Discharge status: Home / Self Care | Payer: Medicare Other | Source: Ambulatory Visit | Attending: Radiation Oncology | Admitting: Radiation Oncology

## 2018-05-11 DIAGNOSIS — C3411 Malignant neoplasm of upper lobe, right bronchus or lung: Secondary | ICD-10-CM | POA: Diagnosis not present

## 2018-05-11 DIAGNOSIS — Z510 Encounter for antineoplastic radiation therapy: Secondary | ICD-10-CM | POA: Diagnosis not present

## 2018-05-11 DIAGNOSIS — Z87891 Personal history of nicotine dependence: Secondary | ICD-10-CM | POA: Diagnosis not present

## 2018-05-29 ENCOUNTER — Inpatient Hospital Stay (HOSPITAL_BASED_OUTPATIENT_CLINIC_OR_DEPARTMENT_OTHER): Payer: Medicare Other | Admitting: Internal Medicine

## 2018-05-29 ENCOUNTER — Inpatient Hospital Stay: Payer: Medicare Other | Attending: Internal Medicine

## 2018-05-29 VITALS — BP 130/82 | HR 89 | Temp 97.8°F | Resp 16 | Wt 145.6 lb

## 2018-05-29 DIAGNOSIS — Z9221 Personal history of antineoplastic chemotherapy: Secondary | ICD-10-CM | POA: Diagnosis not present

## 2018-05-29 DIAGNOSIS — N189 Chronic kidney disease, unspecified: Secondary | ICD-10-CM | POA: Diagnosis not present

## 2018-05-29 DIAGNOSIS — J449 Chronic obstructive pulmonary disease, unspecified: Secondary | ICD-10-CM | POA: Insufficient documentation

## 2018-05-29 DIAGNOSIS — Z923 Personal history of irradiation: Secondary | ICD-10-CM | POA: Insufficient documentation

## 2018-05-29 DIAGNOSIS — R042 Hemoptysis: Secondary | ICD-10-CM | POA: Diagnosis not present

## 2018-05-29 DIAGNOSIS — Z87891 Personal history of nicotine dependence: Secondary | ICD-10-CM | POA: Diagnosis not present

## 2018-05-29 DIAGNOSIS — C3411 Malignant neoplasm of upper lobe, right bronchus or lung: Secondary | ICD-10-CM

## 2018-05-29 DIAGNOSIS — Z79899 Other long term (current) drug therapy: Secondary | ICD-10-CM | POA: Insufficient documentation

## 2018-05-29 DIAGNOSIS — F449 Dissociative and conversion disorder, unspecified: Secondary | ICD-10-CM | POA: Diagnosis not present

## 2018-05-29 DIAGNOSIS — I129 Hypertensive chronic kidney disease with stage 1 through stage 4 chronic kidney disease, or unspecified chronic kidney disease: Secondary | ICD-10-CM | POA: Diagnosis not present

## 2018-05-29 DIAGNOSIS — H409 Unspecified glaucoma: Secondary | ICD-10-CM | POA: Insufficient documentation

## 2018-05-29 DIAGNOSIS — Z7982 Long term (current) use of aspirin: Secondary | ICD-10-CM | POA: Diagnosis not present

## 2018-05-29 LAB — CBC WITH DIFFERENTIAL/PLATELET
Basophils Absolute: 0 10*3/uL (ref 0–0.1)
Basophils Relative: 1 %
EOS ABS: 0.3 10*3/uL (ref 0–0.7)
Eosinophils Relative: 4 %
HCT: 33.2 % — ABNORMAL LOW (ref 35.0–47.0)
HEMOGLOBIN: 11 g/dL — AB (ref 12.0–16.0)
Lymphocytes Relative: 8 %
Lymphs Abs: 0.6 10*3/uL — ABNORMAL LOW (ref 1.0–3.6)
MCH: 29.7 pg (ref 26.0–34.0)
MCHC: 33.1 g/dL (ref 32.0–36.0)
MCV: 89.8 fL (ref 80.0–100.0)
Monocytes Absolute: 0.7 10*3/uL (ref 0.2–0.9)
Monocytes Relative: 10 %
Neutro Abs: 5.3 10*3/uL (ref 1.4–6.5)
Neutrophils Relative %: 77 %
Platelets: 244 10*3/uL (ref 150–440)
RBC: 3.7 MIL/uL — ABNORMAL LOW (ref 3.80–5.20)
RDW: 24.7 % — ABNORMAL HIGH (ref 11.5–14.5)
WBC: 6.8 10*3/uL (ref 3.6–11.0)

## 2018-05-29 NOTE — Assessment & Plan Note (Addendum)
#  Right upper lobe lung cancer-squamous cell-stage III/ unresectable; on carboplatin Taxol with radiation; currently status post chemo-radiation July 18.  Clinically stable.  #Plan getting CT imaging prior to starting maintenance immunotherapy with durvalumab.  #COPD-continue inhalers stable.  # Fatigue-grade 1-2-multifactorial.  Stable  #Chronic kidney disease 1.2-1.4.  Stable.  # follow up in 4 weeks/labs- CT scan prior; Imfinzi.   Cc; Dr.Kolluru

## 2018-05-29 NOTE — Progress Notes (Signed)
[place] OFFICE PROGRESS NOTE  Patient Care Team: Leonel Ramsay, MD as PCP - General (Infectious Diseases) Telford Nab, RN as Registered Nurse  Cancer Staging No matching staging information was found for the patient.   Oncology History   # RUL LUNG CANCER-non-small cell; favor squamous cell; PET scan T4N0 [right upper lobe mass involving[mediastinal/blood results] vs small pleural effusion [?M1]   # Hx of Right breast ca [s/p lumpec; RT; No chemo; "pill" x5 years]  # COPD/Hx of smoking  MOLECULAR TESTING/OMNISEQ: PDL-1 25% [TPS; TMB-H; No targets**]  --------------------------------------------------    DIAGNOSIS: [ April 2019]SQUAMOUS CELL LUNG CA  STAGE: III ;GOALS: CURATIVE  CURRENT/MOST RECENT THERAPY [ May 2019]- Carbo-taxol with RT .       Primary cancer of right upper lobe of lung ([place])   03/06/2018 -  Chemotherapy    The patient had palonosetron (ALOXI) injection 0.25 mg, 0.25 mg, Intravenous,  Once, 9 of 9 cycles Administration: 0.25 mg (03/13/2018), 0.25 mg (04/10/2018), 0.25 mg (04/17/2018), 0.25 mg (03/21/2018), 0.25 mg (04/25/2018), 0.25 mg (03/27/2018), 0.25 mg (04/03/2018), 0.25 mg (05/01/2018), 0.25 mg (05/08/2018) CARBOplatin (PARAPLATIN) 130 mg in sodium chloride 0.9 % 250 mL chemo infusion, 130 mg (100 % of original dose 131.4 mg), Intravenous,  Once, 9 of 9 cycles Dose modification:   (original dose 131.4 mg, Cycle 1) Administration: 130 mg (03/13/2018), 130 mg (04/10/2018), 130 mg (04/17/2018), 130 mg (03/21/2018), 130 mg (04/25/2018), 130 mg (03/27/2018), 130 mg (04/03/2018), 130 mg (05/01/2018), 130 mg (05/08/2018) PACLitaxel (TAXOL) 78 mg in sodium chloride 0.9 % 250 mL chemo infusion (</= 93m/m2), 45 mg/m2 = 78 mg, Intravenous,  Once, 9 of 9 cycles Administration: 78 mg (03/13/2018), 78 mg (04/10/2018), 78 mg (04/17/2018), 78 mg (03/21/2018), 78 mg (04/25/2018), 78 mg (03/27/2018), 78 mg (04/03/2018), 78 mg (05/01/2018), 78 mg (05/08/2018)  for chemotherapy  treatment.        INTERVAL HISTORY:  Brenda BROUHARD89y.o.  female pleasant patient above history of stage III lung cancer is here for follow-up.  Patient is currently status post chemoradiation.  Patient complains of mild to moderate fatigue.  Complains of mild cough mild shortness of breath.  Otherwise no hemoptysis.  No nausea no vomiting.  No tingling or numbness.  Review of Systems  Constitutional: Positive for malaise/fatigue. Negative for chills, diaphoresis, fever and weight loss.  HENT: Negative for nosebleeds and sore throat.   Eyes: Negative for double vision.  Respiratory: Positive for cough and shortness of breath. Negative for hemoptysis, sputum production and wheezing.   Cardiovascular: Negative for chest pain, palpitations, orthopnea and leg swelling.  Gastrointestinal: Negative for abdominal pain, blood in stool, constipation, diarrhea, heartburn, melena, nausea and vomiting.  Genitourinary: Negative for dysuria, frequency and urgency.  Musculoskeletal: Negative for back pain and joint pain.  Skin: Negative.  Negative for itching and rash.  Neurological: Negative for dizziness, tingling, focal weakness, weakness and headaches.  Endo/Heme/Allergies: Does not bruise/bleed easily.  Psychiatric/Behavioral: Negative for depression. The patient is not nervous/anxious and does not have insomnia.       PAST MEDICAL HISTORY :  Past Medical History:  Diagnosis Date  . Benign liver cyst   . [place]   [place] breast  . [place] disease) [place]   . Dyspnea   . Glaucoma   . Hemoptysis 2019  . Hypertension    Not on medication for htn. patient denies this  . Lung mass 01/2018  . Personal history  of radiation therapy     PAST SURGICAL HISTORY :   Past Surgical History:  Procedure Laterality Date  . BREAST EXCISIONAL BIOPSY Right 2005   positive, radiation  . CHOLECYSTECTOMY  1988  . COLONOSCOPY  2012  . ENDOBRONCHIAL  ULTRASOUND N/A 02/21/2018   Procedure: ENDOBRONCHIAL ULTRASOUND;  Surgeon: Laverle Hobby, MD;  Location: [facility] ORS;  Service: Pulmonary;  Laterality: N/A;  . EYE SURGERY Bilateral 2009,2010   cataract extractions with lens implant  . GALLBLADDER SURGERY Bilateral   . IR FLUORO GUIDE PORT INSERTION RIGHT  03/10/2018  . JOINT REPLACEMENT Left 2008   left hip replacement  . PORTACATH PLACEMENT  03/10/2018  . TONSILLECTOMY  1960    FAMILY HISTORY :   Family History  Problem Relation Age of Onset  . Breast cancer Mother 67    SOCIAL HISTORY:   Social History   Tobacco Use  . Smoking status: Former Smoker    Packs/day: 1.00    Types: Cigarettes    Last attempt to quit: 10/25/2005    Years since quitting: 12.6  . Smokeless tobacco: Never Used  Substance Use Topics  . Alcohol use: Not Currently    Comment: occasional glass of beer  . Drug use: Never    ALLERGIES:  is allergic to penicillins.  MEDICATIONS:  Current Outpatient Medications  Medication Sig Dispense Refill  . albuterol (PROVENTIL HFA) 108 (90 Base) MCG/ACT inhaler Inhale 2 puffs into the lungs every 6 (six) hours as needed for wheezing or shortness of breath.     Marland Kitchen aspirin EC 81 MG tablet Take 81 mg by mouth daily.    . Cholecalciferol (VITAMIN D3) 3000 units TABS Take 3,000 Units by mouth daily.     . Fluticasone-Salmeterol (ADVAIR DISKUS) 500-50 MCG/DOSE AEPB Inhale 1 puff into the lungs 2 (two) times daily. 3 each 0  . latanoprost (XALATAN) 0.005 % ophthalmic solution Place 1 drop into both eyes at bedtime.    . lidocaine-prilocaine (EMLA) cream Apply 1 application topically as needed. To port a cath site 30 g 6  . ondansetron (ZOFRAN) 8 MG tablet Take 1 tablet (8 mg total) by mouth every 8 (eight) hours as needed for nausea or vomiting. 20 tablet 3  . prochlorperazine (COMPAZINE) 10 MG tablet Take 1 tablet (10 mg total) by mouth every 6 (six) hours as needed for nausea or vomiting. 30 tablet 3   No current  facility-administered medications for this visit.     PHYSICAL EXAMINATION: ECOG PERFORMANCE STATUS: 1 - Symptomatic but completely ambulatory  BP 130/82 (BP Location: Left Arm, Patient Position: Sitting)   Pulse 89   Temp 97.8 F (36.6 C) (Tympanic)   Resp 16   Wt 145 lb 9.6 oz (66 kg)   BMI 24.99 kg/m   Filed Weights   05/29/18 1021  Weight: 145 lb 9.6 oz (66 kg)    GENERAL: Well-nourished well-developed; Alert, no distress and comfortable.  Accompanied by family. EYES: no pallor or icterus OROPHARYNX: no thrush or ulceration; NECK: supple; no lymph nodes felt. LYMPH:  no palpable lymphadenopathy in the axillary or inguinal regions LUNGS: Decreased breath sounds auscultation bilaterally. No wheeze or crackles HEART/CVS: regular rate & rhythm and no murmurs; No lower extremity edema ABDOMEN:abdomen soft, non-tender and normal bowel sounds. No hepatomegaly or splenomegaly.  Musculoskeletal:no cyanosis of digits and no clubbing  PSYCH: alert & oriented x 3 with fluent speech NEURO: no focal motor/sensory deficits SKIN:  no rashes or significant lesions  LABORATORY DATA:  I have reviewed the data as listed    Component Value Date/Time   NA 137 06/16/2018 1333   K 3.8 06/16/2018 1333   CL 105 06/16/2018 1333   CO2 23 06/16/2018 1333   GLUCOSE 126 (H) 06/16/2018 1333   BUN 22 06/16/2018 1333   CREATININE 1.41 (H) 06/16/2018 1333   CALCIUM 9.4 06/16/2018 1333   PROT 7.6 06/16/2018 1333   ALBUMIN 3.7 06/16/2018 1333   AST 20 06/16/2018 1333   ALT 12 06/16/2018 1333   ALKPHOS 90 06/16/2018 1333   BILITOT 0.8 06/16/2018 1333   GFRNONAA 34 (L) 06/16/2018 1333   GFRAA 40 (L) 06/16/2018 1333    No results found for: SPEP, UPEP  Lab Results  Component Value Date   WBC 10.0 06/16/2018   NEUTROABS 8.7 (H) 06/16/2018   HGB 11.7 (L) 06/16/2018   HCT 34.7 (L) 06/16/2018   MCV 91.2 06/16/2018   PLT 295 06/16/2018      Chemistry      Component Value Date/Time    NA 137 06/16/2018 1333   K 3.8 06/16/2018 1333   CL 105 06/16/2018 1333   CO2 23 06/16/2018 1333   BUN 22 06/16/2018 1333   CREATININE 1.41 (H) 06/16/2018 1333      Component Value Date/Time   CALCIUM 9.4 06/16/2018 1333   ALKPHOS 90 06/16/2018 1333   AST 20 06/16/2018 1333   ALT 12 06/16/2018 1333   BILITOT 0.8 06/16/2018 1333       RADIOGRAPHIC STUDIES: I have personally reviewed the radiological images as listed and agreed with the findings in the report. No results found.   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  Clinically stable.  #Plan getting CT imaging prior to starting maintenance immunotherapy with durvalumab.  #COPD-continue inhalers stable.  # Fatigue-grade 1-2-multifactorial.  Stable  #Chronic kidney disease 1.2-1.4.  Stable.  # follow up in 4 weeks/labs- CT scan prior; Im[facility].   Cc; Dr.Kolluru   Orders Placed This Encounter  Procedures  . CT CHEST WO CONTRAST    Standing Status:   Future    Standing Expiration Date:   05/30/2019    Order Specific Question:   Preferred imaging location?    Answer:   [facility]    Order Specific Question:   Radiology Contrast Protocol - do NOT remove file path    Answer:   _[email or web address]    Order Specific Question:   ** REASON FOR EXAM (FREE TEXT)    Answer:   lung cancer s/p chemo- RT  . CBC with Differential    Standing Status:   Future    Standing Expiration Date:   05/29/2019  . Comprehensive metabolic panel    Standing Status:   Future    Standing Expiration Date:   05/29/2019  . TSH    Standing Status:   Future    Standing Expiration Date:   05/30/2019   All questions were answered. The patient knows to call the clinic with any problems, questions or concerns.      [name],  MD 06/19/2018 9:46 PM

## 2018-06-16 ENCOUNTER — Inpatient Hospital Stay (HOSPITAL_BASED_OUTPATIENT_CLINIC_OR_DEPARTMENT_OTHER): Payer: Medicare Other | Admitting: Nurse Practitioner

## 2018-06-16 ENCOUNTER — Telehealth: Payer: Self-pay | Admitting: *Deleted

## 2018-06-16 ENCOUNTER — Ambulatory Visit
Admission: RE | Admit: 2018-06-16 | Discharge: 2018-06-16 | Disposition: A | Discharge status: Home / Self Care | Payer: Medicare Other | Source: Ambulatory Visit | Attending: Nurse Practitioner | Admitting: Nurse Practitioner

## 2018-06-16 ENCOUNTER — Encounter: Payer: Self-pay | Admitting: Nurse Practitioner

## 2018-06-16 ENCOUNTER — Inpatient Hospital Stay: Payer: Medicare Other

## 2018-06-16 VITALS — BP 135/82 | HR 99 | Temp 99.4°F | Resp 22 | Wt 145.8 lb

## 2018-06-16 DIAGNOSIS — R042 Hemoptysis: Secondary | ICD-10-CM

## 2018-06-16 DIAGNOSIS — Z5189 Encounter for other specified aftercare: Secondary | ICD-10-CM | POA: Insufficient documentation

## 2018-06-16 DIAGNOSIS — J449 Chronic obstructive pulmonary disease, unspecified: Secondary | ICD-10-CM

## 2018-06-16 DIAGNOSIS — N189 Chronic kidney disease, unspecified: Secondary | ICD-10-CM | POA: Diagnosis not present

## 2018-06-16 DIAGNOSIS — Z79899 Other long term (current) drug therapy: Secondary | ICD-10-CM | POA: Diagnosis not present

## 2018-06-16 DIAGNOSIS — I129 Hypertensive chronic kidney disease with stage 1 through stage 4 chronic kidney disease, or unspecified chronic kidney disease: Secondary | ICD-10-CM | POA: Diagnosis not present

## 2018-06-16 DIAGNOSIS — C3411 Malignant neoplasm of upper lobe, right bronchus or lung: Secondary | ICD-10-CM | POA: Diagnosis not present

## 2018-06-16 DIAGNOSIS — Z9221 Personal history of antineoplastic chemotherapy: Secondary | ICD-10-CM | POA: Diagnosis not present

## 2018-06-16 DIAGNOSIS — Z923 Personal history of irradiation: Secondary | ICD-10-CM | POA: Diagnosis not present

## 2018-06-16 DIAGNOSIS — R918 Other nonspecific abnormal finding of lung field: Secondary | ICD-10-CM | POA: Insufficient documentation

## 2018-06-16 DIAGNOSIS — C3490 Malignant neoplasm of unspecified part of unspecified bronchus or lung: Secondary | ICD-10-CM | POA: Diagnosis not present

## 2018-06-16 LAB — CBC WITH DIFFERENTIAL/PLATELET
BASOS ABS: 0 10*3/uL (ref 0–0.1)
BASOS PCT: 0 %
EOS ABS: 0 10*3/uL (ref 0–0.7)
Eosinophils Relative: 0 %
HCT: 34.7 % — ABNORMAL LOW (ref 35.0–47.0)
HEMOGLOBIN: 11.7 g/dL — AB (ref 12.0–16.0)
Lymphocytes Relative: 5 %
Lymphs Abs: 0.5 10*3/uL — ABNORMAL LOW (ref 1.0–3.6)
MCH: 30.7 pg (ref 26.0–34.0)
MCHC: 33.7 g/dL (ref 32.0–36.0)
MCV: 91.2 fL (ref 80.0–100.0)
MONOS PCT: 7 %
Monocytes Absolute: 0.7 10*3/uL (ref 0.2–0.9)
NEUTROS ABS: 8.7 10*3/uL — AB (ref 1.4–6.5)
Neutrophils Relative %: 88 %
Platelets: 295 10*3/uL (ref 150–440)
RBC: 3.81 MIL/uL (ref 3.80–5.20)
RDW: 21.8 % — AB (ref 11.5–14.5)
WBC: 10 10*3/uL (ref 3.6–11.0)

## 2018-06-16 LAB — COMPREHENSIVE METABOLIC PANEL
ALK PHOS: 90 U/L (ref 38–126)
ALT: 12 U/L (ref 0–44)
ANION GAP: 9 (ref 5–15)
AST: 20 U/L (ref 15–41)
Albumin: 3.7 g/dL (ref 3.5–5.0)
BILIRUBIN TOTAL: 0.8 mg/dL (ref 0.3–1.2)
BUN: 22 mg/dL (ref 8–23)
CALCIUM: 9.4 mg/dL (ref 8.9–10.3)
CO2: 23 mmol/L (ref 22–32)
CREATININE: 1.41 mg/dL — AB (ref 0.44–1.00)
Chloride: 105 mmol/L (ref 98–111)
GFR calc Af Amer: 40 mL/min — ABNORMAL LOW (ref >60)
GFR, EST NON AFRICAN AMERICAN: 34 mL/min — AB (ref >60)
Glucose, Bld: 126 mg/dL — ABNORMAL HIGH (ref 70–99)
Potassium: 3.8 mmol/L (ref 3.5–5.1)
Sodium: 137 mmol/L (ref 135–145)
TOTAL PROTEIN: 7.6 g/dL (ref 6.5–8.1)

## 2018-06-16 NOTE — Patient Instructions (Signed)
Hemoptysis Hemoptysis is when you cough up blood. It can be mild or serious. If it is mild, you may cough up bloody spit and mucus (sputum). If you cough up 1-2 cups (240-480 mL) of blood within 24 hours (massive hemoptysis), it is an emergency. If you cough up blood, it is important to go and see your doctor. Follow these instructions at home:  Watch your condition for any changes.  Take over-the-counter and prescription medicines only as told by your doctor.  If you were prescribed an antibiotic medicine, take it as told by your doctor. Do not stop taking the antibiotic even if you start to feel better.  Go back to your normal activities as told by your doctor. Ask your doctor what activities are safe for you to do.  Do not use any products that contain nicotine or tobacco. These include cigarettes and e-cigarettes. If you need help quitting, ask your doctor.  Keep all follow-up visits as told by your doctor. This is important. Contact a doctor if:  You have a fever.  You cough up bloody spit and mucus. Get help right away if:  You cough up fresh blood or blood clots.  You have trouble breathing.  You have chest pain. This information is not intended to replace advice given to you by your health care provider. Make sure you discuss any questions you have with your health care provider. Document Released: 09/27/2012 Document Revised: 07/09/2016 Document Reviewed: 07/09/2016 Elsevier Interactive Patient Education  [employer].

## 2018-06-16 NOTE — Telephone Encounter (Signed)
Patient coming in at 100 for cxr then to cc for lab NP

## 2018-06-16 NOTE — Telephone Encounter (Signed)
Patient called and reports that she is coughing up blood. She is s/p chemotherapy radiation therapy for lung cancer

## 2018-06-16 NOTE — Progress Notes (Signed)
[facility] [place]  [phone] [phone]  Patient Care Team: Leonel Ramsay, MD as PCP - General (Infectious Diseases) Telford Nab, RN as Registered Nurse [name], MD as Medical Oncologist (Medical Oncology)   Name of the patient: Brenda Parker  [id number]  1936-05-22   Date of visit: 06/16/18  Diagnosis- Lung Cancer  Chief complaint/ Reason for visit- hemoptysis  Heme/Onc history:  Oncology History   # RUL LUNG CANCER-non-small cell; favor squamous cell; PET scan T4N0 [right upper lobe mass involving[mediastinal/blood results] vs small pleural effusion [?M1]   # Hx of Right breast ca [s/p lumpec; RT; No chemo; "pill" x5 years]  # COPD/Hx of smoking  MOLECULAR TESTING/OMNISEQ: PDL-1 25% [TPS; TMB-H; No targets**]  --------------------------------------------------    DIAGNOSIS: [ April 2019]SQUAMOUS CELL LUNG CA  STAGE: III ;GOALS: CURATIVE  CURRENT/MOST RECENT THERAPY [ May 2019]- Carbo-taxol with RT .       Primary cancer of right upper lobe of lung ([place])   03/06/2018 -  Chemotherapy    The patient had palonosetron (ALOXI) injection 0.25 mg, 0.25 mg, Intravenous,  Once, 9 of 9 cycles Administration: 0.25 mg (03/13/2018), 0.25 mg (04/10/2018), 0.25 mg (04/17/2018), 0.25 mg (03/21/2018), 0.25 mg (04/25/2018), 0.25 mg (03/27/2018), 0.25 mg (04/03/2018), 0.25 mg (05/01/2018), 0.25 mg (05/08/2018) CARBOplatin (PARAPLATIN) 130 mg in sodium chloride 0.9 % 250 mL chemo infusion, 130 mg (100 % of original dose 131.4 mg), Intravenous,  Once, 9 of 9 cycles Dose modification:   (original dose 131.4 mg, Cycle 1) Administration: 130 mg (03/13/2018), 130 mg (04/10/2018), 130 mg (04/17/2018), 130 mg (03/21/2018), 130 mg (04/25/2018), 130 mg (03/27/2018), 130 mg (04/03/2018), 130 mg (05/01/2018), 130 mg (05/08/2018) PACLitaxel (TAXOL) 78 mg in sodium chloride 0.9 % 250 mL chemo infusion (</= 68m/m2), 45 mg/m2 = 78 mg,  Intravenous,  Once, 9 of 9 cycles Administration: 78 mg (03/13/2018), 78 mg (04/10/2018), 78 mg (04/17/2018), 78 mg (03/21/2018), 78 mg (04/25/2018), 78 mg (03/27/2018), 78 mg (04/03/2018), 78 mg (05/01/2018), 78 mg (05/08/2018)  for chemotherapy treatment.      Interval history- [facility] Parker 82[facility]. Symptoms occurred once this morning and not since that time. She estimates 1-2 small clots, less than teaspoon. Describes as bright red. She denies constant cough, cough after eating, difficulty breathing, drainage from nose, dry cough, dyspnea on exertion, dyspnea when laying down, edema, frequent throat clearing, tightness in chest and wheezing. Associated symptoms include none.  She has not had recent travel. Weight has been stable. Appetite has been increased. Symptoms resolved spontaneously without improving or exacerbating factors. She takes low dose aspirin daily. Not on other blood thinners. Hx of COPD but does not feel her breathing has worsened. She states that she feels well but wanted to get 'checked out'.   ECOG FS:1 - Symptomatic but completely ambulatory  Review of systems- Review of Systems  Constitutional: Negative for chills, fever, malaise/fatigue and weight loss.  HENT: Negative for congestion, ear discharge, ear pain, sinus pain, sore throat and tinnitus.   Eyes: Negative.   Respiratory: Positive for hemoptysis. Negative for cough, sputum production and shortness of breath.   Cardiovascular: Negative for chest pain, palpitations, orthopnea, claudication and leg swelling.  Gastrointestinal: Negative for abdominal pain, blood in stool, constipation, diarrhea, heartburn, nausea and vomiting.  Genitourinary: Negative.   Musculoskeletal: Negative.   Skin: Negative.   Neurological: Negative for dizziness, tingling, weakness and headaches.  Endo/Heme/Allergies: Negative.  Psychiatric/Behavioral: Negative.     Current treatment- s/p chemo-rad on  05/11/18  Allergies  Allergen Reactions  . Penicillins Rash    Patient had a rash after an injection in 1973.  Unsure if taken orally will cause any reaction  Has patient had a PCN reaction causing immediate rash, facial/tongue/throat swelling, SOB or lightheadedness with hypotension: Yes Has patient had a PCN reaction causing severe rash involving mucus membranes or skin necrosis: No Has patient had a PCN reaction that required hospitalization: No Has patient had a PCN reaction occurring within the last 10 years: No     Past Medical History:  Diagnosis Date  . Benign liver cyst   . Breast cancer ([place]) 2005   right breast  . COPD (chronic obstructive pulmonary disease) ([place])   . Dyspnea   . Glaucoma   . Hemoptysis 2019  . Hypertension    Not on medication for htn. patient denies this  . Lung mass 01/2018  . Personal history of radiation therapy     Past Surgical History:  Procedure Laterality Date  . BREAST EXCISIONAL BIOPSY Right 2005   positive, radiation  . CHOLECYSTECTOMY  1988  . COLONOSCOPY  2012  . ENDOBRONCHIAL ULTRASOUND N/A 02/21/2018   Procedure: ENDOBRONCHIAL ULTRASOUND;  Surgeon: Laverle Hobby, MD;  Location: [facility] ORS;  Service: Pulmonary;  Laterality: N/A;  . EYE SURGERY Bilateral 2009,2010   cataract extractions with lens implant  . GALLBLADDER SURGERY Bilateral   . IR FLUORO GUIDE PORT INSERTION RIGHT  03/10/2018  . JOINT REPLACEMENT Left 2008   left hip replacement  . PORTACATH PLACEMENT  03/10/2018  . TONSILLECTOMY  1960    Social History   Socioeconomic History  . Marital status: Widowed    Spouse name: Not on file  . Number of children: Not on file  . Years of education: Not on file  . Highest education level: Not on file  Occupational History  . Not on file  Social Needs  . Financial resource strain: Not on file  . Food insecurity:    Worry: Not on file    Inability: Not on file  . Transportation needs:    Medical: Not on file     Non-medical: Not on file  Tobacco Use  . Smoking status: Former Smoker    Packs/day: 1.00    Types: Cigarettes    Last attempt to quit: 10/25/2005    Years since quitting: 12.6  . Smokeless tobacco: Never Used  Substance and Sexual Activity  . Alcohol use: Not Currently    Comment: occasional glass of beer  . Drug use: Never  . Sexual activity: Not Currently  Lifestyle  . Physical activity:    Days per week: Not on file    Minutes per session: Not on file  . Stress: Not on file  Relationships  . Social connections:    Talks on phone: Not on file    Gets together: Not on file    Attends religious service: Not on file    Active member of club or organization: Not on file    Attends meetings of clubs or organizations: Not on file    Relationship status: Not on file  . Intimate partner violence:    Fear of current or ex partner: Not on file    Emotionally abused: Not on file    Physically abused: Not on file    Forced sexual activity: Not on file  Other Topics Concern  . Not on file  Social History Narrative  . Not on file    Family History  Problem Relation Age of Onset  . Breast cancer Mother 76     Current Outpatient Medications:  .  albuterol (PROVENTIL HFA) 108 (90 Base) MCG/ACT inhaler, Inhale 2 puffs into the lungs every 6 (six) hours as needed for wheezing or shortness of breath. , Disp: , Rfl:  .  aspirin EC 81 MG tablet, Take 81 mg by mouth daily., Disp: , Rfl:  .  Cholecalciferol (VITAMIN D3) 3000 units TABS, Take 3,000 Units by mouth daily. , Disp: , Rfl:  .  Fluticasone-Salmeterol (ADVAIR DISKUS) 500-50 MCG/DOSE AEPB, Inhale 1 puff into the lungs 2 (two) times daily., Disp: 3 each, Rfl: 0 .  latanoprost (XALATAN) 0.005 % ophthalmic solution, Place 1 drop into both eyes at bedtime., Disp: , Rfl:  .  lidocaine-prilocaine (EMLA) cream, Apply 1 application topically as needed. To port a cath site, Disp: 30 g, Rfl: 6 .  ondansetron (ZOFRAN) 8 MG tablet, Take 1  tablet (8 mg total) by mouth every 8 (eight) hours as needed for nausea or vomiting., Disp: 20 tablet, Rfl: 3 .  prochlorperazine (COMPAZINE) 10 MG tablet, Take 1 tablet (10 mg total) by mouth every 6 (six) hours as needed for nausea or vomiting., Disp: 30 tablet, Rfl: 3  Physical exam:  Vitals:   06/16/18 1417 06/16/18 1422  Resp: (!) 22   Temp:  (!) 100.5 F (38.1 C)  TempSrc:  Tympanic  SpO2: 95%   Weight: 145 lb 12.8 oz (66.1 kg)    Physical Exam  Constitutional: She is oriented to person, place, and time. She appears well-developed and well-nourished.  Elderly female. Accompanied by son.   HENT:  Head: Atraumatic.  Nose: Nose normal.  Mouth/Throat: No oropharyngeal exudate.  Eyes: Conjunctivae are normal. No scleral icterus.  Neck: Normal range of motion. Neck supple.  Cardiovascular: Normal rate, regular rhythm and normal heart sounds.  No murmur heard. Pulmonary/Chest: Effort normal and breath sounds normal. No stridor. No respiratory distress. She has no wheezes. She has no rales.  Abdominal: Soft. Bowel sounds are normal. She exhibits no distension. There is no tenderness. There is no rebound.  Musculoskeletal: She exhibits no edema.  Neurological: She is alert and oriented to person, place, and time.  Skin: Skin is warm and dry.  Psychiatric: She has a normal mood and affect.     CMP Latest Ref Rng & Units 06/16/2018  Glucose 70 - 99 mg/dL 126(H)  BUN 8 - 23 mg/dL 22  Creatinine 0.44 - 1.00 mg/dL 1.41(H)  Sodium 135 - 145 mmol/L 137  Potassium 3.5 - 5.1 mmol/L 3.8  Chloride 98 - 111 mmol/L 105  CO2 22 - 32 mmol/L 23  Calcium 8.9 - 10.3 mg/dL 9.4  Total Protein 6.5 - 8.1 g/dL 7.6  Total Bilirubin 0.3 - 1.2 mg/dL 0.8  Alkaline Phos 38 - 126 U/L 90  AST 15 - 41 U/L 20  ALT 0 - 44 U/L 12   CBC Latest Ref Rng & Units 06/16/2018  WBC 3.6 - 11.0 K/uL 10.0  Hemoglobin 12.0 - 16.0 g/dL 11.7(L)  Hematocrit 35.0 - 47.0 % 34.7(L)  Platelets 150 - 440 K/uL 295    No  images are attached to the encounter.  Dg Chest 2 View  Result Date: 06/16/2018 CLINICAL DATA:  Hemoptysis, lung cancer EXAM: CHEST - 2 VIEW COMPARISON:  01/16/2018 chest CT FINDINGS: Left internal jugular MediPort terminates in the lower third of   the SVC. Heart size. Stable mediastinal contour with prominence of the right lower paratracheal mediastinum. No pneumothorax. No pleural effusion. Curvilinear perihilar right upper lung opacities appear decreased slightly. Small nodular opacity in the peripheral right mid lung is not appreciably changed. No acute consolidative airspace disease. No pulmonary edema. IMPRESSION: 1. No acute cardiopulmonary disease. 2. Stable prominence of the right lower paratracheal with decreased curvilinear right upper parahilar lung opacities, compatible with a combination of known tumor and post treatment change. Stable nodular opacity in the peripheral right mid lung. Electronically Signed   By: Jason A Poff M.D.   On: 06/16/2018 13:29    Assessment and plan- Patient is a 82 y.o. female diagnosed with lung cancer who [facility]sis.   1.  Stage IIIa non-small cell lung cancer of right upper lobe-unresectable.  Completed carboplatin-Taxol with radiation on 05/11/2018 with Dr. [name] and Dr. Chrystal. Plans to repeat imaging prior to consideration of initiation of maintenance immunotherapy with durvalumab.   2. Hemoptysis-one episode of trace hemoptysis; Labs today reviewed.  Hemoglobin 11.7, platelets 295.  Vitals stable.  On daily aspirin; no chronic anticoagulants.  Etiology unclear but may be related to recent radiation/inflammatory/underlying infection vs neoplasm.  X-ray today stable/improved.  Given small volume and single episode, will monitor her symptoms at this time.  Recommend holding aspirin for 1 week and if no additional episodes of hemoptysis can restart aspirin.  If hemoptysis increases in frequency or volume, would  consider further work-up.  rtc on 06/28/18 for labs & evaluation with Dr. [name].  Patient advised to notify the clinic if there is no improvement in symptoms or if symptoms worsen.     Visit Diagnosis 1. Primary cancer of right upper lobe of lung ([facility])   2. Hemoptysis     Patient expressed understanding and was in agreement with this plan. She also understands that She can call clinic at any time with any questions, concerns, or complaints.   Thank you for allowing me to participate in the care of this very pleasant patient.   Lauren Allen, DNP, AGNP-[facility] [phone] (work cell) [phone] (office)    

## 2018-06-19 ENCOUNTER — Encounter: Payer: Self-pay | Admitting: Radiation Oncology

## 2018-06-19 ENCOUNTER — Ambulatory Visit
Admission: RE | Admit: 2018-06-19 | Discharge: 2018-06-19 | Disposition: A | Discharge status: Home / Self Care | Payer: Medicare Other | Source: Ambulatory Visit | Attending: Radiation Oncology | Admitting: Radiation Oncology

## 2018-06-19 ENCOUNTER — Other Ambulatory Visit: Payer: Self-pay

## 2018-06-19 DIAGNOSIS — C3490 Malignant neoplasm of unspecified part of unspecified bronchus or lung: Secondary | ICD-10-CM

## 2018-06-19 DIAGNOSIS — C7931 Secondary malignant neoplasm of brain: Principal | ICD-10-CM

## 2018-06-19 NOTE — Progress Notes (Signed)
Radiation Oncology Follow up Note  Name: Brenda Parker   Date:   06/19/2018 MRN:  [id number] DOB: 1936-08-18    This 82 y.o. female presents to the clinic today for one-month follow-up status post radiation therapy for stage IIIa (T4 N0 M0) non-small cell lung cancer of the right lobe.  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 Seen today in routine follow-up she is doing well. She started a few days ago have some slight trace hemoptysis has discontinued her daily aspirin. She states otherwise she feels fine no chest tightness.  COMPLICATIONS OF TREATMENT: none  FOLLOW UP COMPLIANCE: keeps appointments   PHYSICAL EXAM:  BP (P) 113/69 (BP Location: Left Arm, Patient Position: Sitting)   Pulse (P) 80   Temp (P) 97.6 F (36.4 C) (Tympanic)   Resp (!) (P) 22   Wt (P) 144 lb 8.2 oz (65.5 kg)   BMI (P) 24.81 kg/m  Well-developed well-nourished patient in NAD. HEENT reveals PERLA, EOMI, discs not visualized.  Oral cavity is clear. No oral mucosal lesions are identified. Neck is clear without evidence of cervical or supraclavicular adenopathy. Lungs are clear to A&P. Cardiac examination is essentially unremarkable with regular rate and rhythm without murmur rub or thrill. Abdomen is benign with no organomegaly or masses noted. Motor sensory and DTR levels are equal and symmetric in the upper and lower extremities. Cranial nerves II through XII are grossly intact. Proprioception is intact. No peripheral adenopathy or edema is identified. No motor or sensory levels are noted. Crude visual fields are within normal range.  RADIOLOGY RESULTS: CT scan has been scheduled for later this week  PLAN: I will review his CT scan when it becomes available. Otherwise she slated to see medical oncology  for possible continuation of treatment. She'll stay off her aspirin for next week or so and then I would feel comfortable restarting that. I've otherwise asked to see her back in 3 months for follow-up. Patient is to call sooner with any concerns.  I would like to take this opportunity to thank you for allowing me to participate in the care of your patient.Noreene Filbert, MD

## 2018-06-23 ENCOUNTER — Ambulatory Visit
Admission: RE | Admit: 2018-06-23 | Discharge: 2018-06-23 | Disposition: A | Discharge status: Home / Self Care | Payer: Medicare Other | Source: Ambulatory Visit | Attending: Internal Medicine | Admitting: Internal Medicine

## 2018-06-23 DIAGNOSIS — J439 Emphysema, unspecified: Secondary | ICD-10-CM | POA: Diagnosis not present

## 2018-06-23 DIAGNOSIS — I700 Atherosclerosis of aorta: Secondary | ICD-10-CM | POA: Insufficient documentation

## 2018-06-23 DIAGNOSIS — I2510 Atherosclerotic heart disease of native coronary artery without angina pectoris: Secondary | ICD-10-CM | POA: Diagnosis not present

## 2018-06-23 DIAGNOSIS — C3411 Malignant neoplasm of upper lobe, right bronchus or lung: Secondary | ICD-10-CM | POA: Diagnosis not present

## 2018-06-23 DIAGNOSIS — J189 Pneumonia, unspecified organism: Secondary | ICD-10-CM | POA: Diagnosis not present

## 2018-06-28 ENCOUNTER — Inpatient Hospital Stay: Payer: Medicare Other | Attending: Internal Medicine

## 2018-06-28 ENCOUNTER — Inpatient Hospital Stay (HOSPITAL_BASED_OUTPATIENT_CLINIC_OR_DEPARTMENT_OTHER): Payer: Medicare Other | Admitting: Internal Medicine

## 2018-06-28 ENCOUNTER — Encounter: Payer: Self-pay | Admitting: Internal Medicine

## 2018-06-28 ENCOUNTER — Inpatient Hospital Stay: Payer: Medicare Other

## 2018-06-28 ENCOUNTER — Inpatient Hospital Stay
Admission: AD | Admit: 2018-06-28 | Discharge: 2018-06-30 | DRG: 871 | Disposition: A | Discharge status: Home Health | Payer: Medicare Other | Source: Ambulatory Visit | Attending: Internal Medicine | Admitting: Internal Medicine

## 2018-06-28 ENCOUNTER — Other Ambulatory Visit: Payer: Self-pay

## 2018-06-28 VITALS — BP 119/69 | HR 114 | Temp 98.9°F | Resp 16

## 2018-06-28 DIAGNOSIS — Z79899 Other long term (current) drug therapy: Secondary | ICD-10-CM

## 2018-06-28 DIAGNOSIS — W19XXXA Unspecified fall, initial encounter: Secondary | ICD-10-CM | POA: Diagnosis not present

## 2018-06-28 DIAGNOSIS — Z9221 Personal history of antineoplastic chemotherapy: Secondary | ICD-10-CM | POA: Insufficient documentation

## 2018-06-28 DIAGNOSIS — N179 Acute kidney failure, unspecified: Secondary | ICD-10-CM | POA: Diagnosis present

## 2018-06-28 DIAGNOSIS — Z8679 Personal history of other diseases of the circulatory system: Secondary | ICD-10-CM

## 2018-06-28 DIAGNOSIS — M7989 Other specified soft tissue disorders: Secondary | ICD-10-CM | POA: Insufficient documentation

## 2018-06-28 DIAGNOSIS — Z853 Personal history of malignant neoplasm of breast: Secondary | ICD-10-CM

## 2018-06-28 DIAGNOSIS — N189 Chronic kidney disease, unspecified: Secondary | ICD-10-CM | POA: Insufficient documentation

## 2018-06-28 DIAGNOSIS — K5900 Constipation, unspecified: Secondary | ICD-10-CM | POA: Insufficient documentation

## 2018-06-28 DIAGNOSIS — J440 Chronic obstructive pulmonary disease with acute lower respiratory infection: Secondary | ICD-10-CM | POA: Diagnosis not present

## 2018-06-28 DIAGNOSIS — R05 Cough: Secondary | ICD-10-CM | POA: Diagnosis not present

## 2018-06-28 DIAGNOSIS — Z96642 Presence of left artificial hip joint: Secondary | ICD-10-CM | POA: Diagnosis present

## 2018-06-28 DIAGNOSIS — Z923 Personal history of irradiation: Secondary | ICD-10-CM

## 2018-06-28 DIAGNOSIS — Z7951 Long term (current) use of inhaled steroids: Secondary | ICD-10-CM

## 2018-06-28 DIAGNOSIS — M545 Low back pain: Secondary | ICD-10-CM | POA: Diagnosis present

## 2018-06-28 DIAGNOSIS — Z23 Encounter for immunization: Secondary | ICD-10-CM | POA: Diagnosis not present

## 2018-06-28 DIAGNOSIS — H409 Unspecified glaucoma: Secondary | ICD-10-CM | POA: Diagnosis present

## 2018-06-28 DIAGNOSIS — Z95828 Presence of other vascular implants and grafts: Secondary | ICD-10-CM

## 2018-06-28 DIAGNOSIS — R042 Hemoptysis: Secondary | ICD-10-CM | POA: Diagnosis present

## 2018-06-28 DIAGNOSIS — C3411 Malignant neoplasm of upper lobe, right bronchus or lung: Secondary | ICD-10-CM

## 2018-06-28 DIAGNOSIS — N183 Chronic kidney disease, stage 3 (moderate): Secondary | ICD-10-CM | POA: Diagnosis present

## 2018-06-28 DIAGNOSIS — A419 Sepsis, unspecified organism: Secondary | ICD-10-CM | POA: Diagnosis present

## 2018-06-28 DIAGNOSIS — R634 Abnormal weight loss: Secondary | ICD-10-CM | POA: Diagnosis present

## 2018-06-28 DIAGNOSIS — R0902 Hypoxemia: Secondary | ICD-10-CM | POA: Diagnosis present

## 2018-06-28 DIAGNOSIS — Z6824 Body mass index (BMI) 24.0-24.9, adult: Secondary | ICD-10-CM | POA: Diagnosis not present

## 2018-06-28 DIAGNOSIS — R197 Diarrhea, unspecified: Secondary | ICD-10-CM | POA: Diagnosis present

## 2018-06-28 DIAGNOSIS — Z87891 Personal history of nicotine dependence: Secondary | ICD-10-CM

## 2018-06-28 DIAGNOSIS — C3490 Malignant neoplasm of unspecified part of unspecified bronchus or lung: Secondary | ICD-10-CM | POA: Diagnosis not present

## 2018-06-28 DIAGNOSIS — Z66 Do not resuscitate: Secondary | ICD-10-CM | POA: Diagnosis present

## 2018-06-28 DIAGNOSIS — D649 Anemia, unspecified: Secondary | ICD-10-CM | POA: Insufficient documentation

## 2018-06-28 DIAGNOSIS — J189 Pneumonia, unspecified organism: Secondary | ICD-10-CM | POA: Diagnosis present

## 2018-06-28 DIAGNOSIS — R059 Cough, unspecified: Secondary | ICD-10-CM

## 2018-06-28 DIAGNOSIS — I129 Hypertensive chronic kidney disease with stage 1 through stage 4 chronic kidney disease, or unspecified chronic kidney disease: Secondary | ICD-10-CM | POA: Insufficient documentation

## 2018-06-28 DIAGNOSIS — Z880 Allergy status to penicillin: Secondary | ICD-10-CM

## 2018-06-28 DIAGNOSIS — Z8701 Personal history of pneumonia (recurrent): Secondary | ICD-10-CM | POA: Insufficient documentation

## 2018-06-28 LAB — CBC WITH DIFFERENTIAL/PLATELET
Basophils Absolute: 0.1 10*3/uL (ref 0–0.1)
Basophils Relative: 0 %
Eosinophils Absolute: 0 10*3/uL (ref 0–0.7)
Eosinophils Relative: 0 %
HEMATOCRIT: 28.5 % — AB (ref 35.0–47.0)
Hemoglobin: 9.4 g/dL — ABNORMAL LOW (ref 12.0–16.0)
LYMPHS PCT: 2 %
Lymphs Abs: 0.3 10*3/uL — ABNORMAL LOW (ref 1.0–3.6)
MCH: 30.1 pg (ref 26.0–34.0)
MCHC: 33.1 g/dL (ref 32.0–36.0)
MCV: 91 fL (ref 80.0–100.0)
MONOS PCT: 7 %
Monocytes Absolute: 1.3 10*3/uL — ABNORMAL HIGH (ref 0.2–0.9)
NEUTROS ABS: 18.3 10*3/uL — AB (ref 1.4–6.5)
Neutrophils Relative %: 91 %
Platelets: 334 10*3/uL (ref 150–440)
RBC: 3.13 MIL/uL — ABNORMAL LOW (ref 3.80–5.20)
RDW: 18.4 % — AB (ref 11.5–14.5)
WBC: 20 10*3/uL — ABNORMAL HIGH (ref 3.6–11.0)

## 2018-06-28 LAB — TSH: TSH: 0.777 u[IU]/mL (ref 0.350–4.500)

## 2018-06-28 LAB — COMPREHENSIVE METABOLIC PANEL
ALT: 20 U/L (ref 0–44)
AST: 35 U/L (ref 15–41)
Albumin: 3 g/dL — ABNORMAL LOW (ref 3.5–5.0)
Alkaline Phosphatase: 91 U/L (ref 38–126)
Anion gap: 14 (ref 5–15)
BILIRUBIN TOTAL: 0.9 mg/dL (ref 0.3–1.2)
BUN: 30 mg/dL — ABNORMAL HIGH (ref 8–23)
CO2: 20 mmol/L — AB (ref 22–32)
Calcium: 9.8 mg/dL (ref 8.9–10.3)
Chloride: 101 mmol/L (ref 98–111)
Creatinine, Ser: 1.54 mg/dL — ABNORMAL HIGH (ref 0.44–1.00)
GFR calc Af Amer: 36 mL/min — ABNORMAL LOW (ref >60)
GFR, EST NON AFRICAN AMERICAN: 31 mL/min — AB (ref >60)
Glucose, Bld: 109 mg/dL — ABNORMAL HIGH (ref 70–99)
Potassium: 4 mmol/L (ref 3.5–5.1)
Sodium: 135 mmol/L (ref 135–145)
Total Protein: 7.5 g/dL (ref 6.5–8.1)

## 2018-06-28 LAB — MRSA PCR SCREENING: MRSA by PCR: NEGATIVE

## 2018-06-28 MED ORDER — ONDANSETRON HCL 4 MG PO TABS: 4.0000 mg | ORAL_TABLET | Freq: Four times a day (QID) | ORAL | Status: DC | PRN

## 2018-06-28 MED ORDER — INFLUENZA VAC SPLIT HIGH-DOSE 0.5 ML IM SUSY
0.5000 mL | PREFILLED_SYRINGE | INTRAMUSCULAR | Status: AC
  Administered 2018-06-30: 12:00:00 0.5 mL via INTRAMUSCULAR
  Filled 2018-06-28 (×2): qty 0.5

## 2018-06-28 MED ORDER — MOMETASONE FURO-FORMOTEROL FUM 200-5 MCG/ACT IN AERO
2.0000 | INHALATION_SPRAY | Freq: Two times a day (BID) | RESPIRATORY_TRACT | Status: DC
  Administered 2018-06-28 – 2018-06-30 (×4): 2 via RESPIRATORY_TRACT
  Filled 2018-06-28: qty 8.8

## 2018-06-28 MED ORDER — ACETAMINOPHEN 325 MG PO TABS: 650.0000 mg | ORAL_TABLET | Freq: Four times a day (QID) | ORAL | Status: DC | PRN

## 2018-06-28 MED ORDER — VANCOMYCIN HCL IN DEXTROSE 1-5 GM/200ML-% IV SOLN
1000.0000 mg | Freq: Once | INTRAVENOUS | Status: AC
  Administered 2018-06-28: 1000 mg via INTRAVENOUS
  Filled 2018-06-28: qty 200

## 2018-06-28 MED ORDER — SODIUM CHLORIDE 0.9% FLUSH
10.0000 mL | Freq: Once | INTRAVENOUS | Status: AC
  Administered 2018-06-28: 10 mL via INTRAVENOUS
  Filled 2018-06-28: qty 10

## 2018-06-28 MED ORDER — ALBUTEROL SULFATE HFA 108 (90 BASE) MCG/ACT IN AERS: 2.0000 | INHALATION_SPRAY | Freq: Four times a day (QID) | RESPIRATORY_TRACT | Status: DC | PRN

## 2018-06-28 MED ORDER — LEVOFLOXACIN IN D5W 500 MG/100ML IV SOLN
500.0000 mg | INTRAVENOUS | Status: DC
  Administered 2018-06-28 – 2018-06-29 (×2): 500 mg via INTRAVENOUS
  Filled 2018-06-28 (×3): qty 100

## 2018-06-28 MED ORDER — VITAMIN D 1000 UNITS PO TABS
3000.0000 [IU] | ORAL_TABLET | Freq: Every day | ORAL | Status: DC
  Administered 2018-06-28 – 2018-06-30 (×3): 3000 [IU] via ORAL
  Filled 2018-06-28 (×3): qty 3

## 2018-06-28 MED ORDER — ONDANSETRON HCL 4 MG/2ML IJ SOLN: 4.0000 mg | Freq: Four times a day (QID) | INTRAMUSCULAR | Status: DC | PRN

## 2018-06-28 MED ORDER — ACETAMINOPHEN 650 MG RE SUPP: 650.0000 mg | Freq: Four times a day (QID) | RECTAL | Status: DC | PRN

## 2018-06-28 MED ORDER — LATANOPROST 0.005 % OP SOLN
1.0000 [drp] | Freq: Every day | OPHTHALMIC | Status: DC
  Administered 2018-06-28 – 2018-06-29 (×2): 1 [drp] via OPHTHALMIC
  Filled 2018-06-28: qty 2.5

## 2018-06-28 MED ORDER — VANCOMYCIN HCL IN DEXTROSE 1-5 GM/200ML-% IV SOLN
1000.0000 mg | INTRAVENOUS | Status: DC
  Administered 2018-06-29: 09:00:00 1000 mg via INTRAVENOUS
  Filled 2018-06-28: qty 200

## 2018-06-28 MED ORDER — ALBUTEROL SULFATE (2.5 MG/3ML) 0.083% IN NEBU: 2.5000 mg | INHALATION_SOLUTION | Freq: Four times a day (QID) | RESPIRATORY_TRACT | Status: DC | PRN

## 2018-06-28 MED ORDER — OXYCODONE HCL 5 MG PO TABS
5.0000 mg | ORAL_TABLET | Freq: Four times a day (QID) | ORAL | Status: DC | PRN
  Administered 2018-06-28 – 2018-06-29 (×2): 5 mg via ORAL
  Filled 2018-06-28 (×2): qty 1

## 2018-06-28 MED ORDER — LIDOCAINE-PRILOCAINE 2.5-2.5 % EX CREA
1.0000 "application " | TOPICAL_CREAM | CUTANEOUS | Status: DC | PRN
  Filled 2018-06-28: qty 5

## 2018-06-28 MED ORDER — SODIUM CHLORIDE 0.9 % IV SOLN
INTRAVENOUS | Status: DC
  Administered 2018-06-28 – 2018-06-29 (×2): via INTRAVENOUS

## 2018-06-28 NOTE — Progress Notes (Signed)
Patient presents to clinic today in follow-up. Patient became weak after walking down the exam room hallway. Fall prevented by RN and CMA. Patient placed in w/c. Vitals taken- stable. Patient c/o diarrhea 2 episodes daily for several days. She has not taken any imodium. Her imodium at home has likely expired per pt's son. Patient reports that she is "very weak and often requires assistance getting off the commode at home."  Pt fell yesterday at her residence and laid on the floor for 2 hours and "managed to get her self off the floor very slowly." pt denies hitting her head. She states that she fell on her side but does not recall which slide. Patient's son, who is a Engineer, structural for [employer], stated that his mother did call him after the fall. He did not receive the voice msg until after his shift. Pt did not call emergency services. Patient is short of breath today. sats at 94% RA. Pt does have oxygen therapy home orders, but per son, pt often refuses to use oxygen at home.  Per Dr. Rogue Bussing. V/o to admit patient under the care of Dr. Leslye Peer for pneumonia. Bed placement notified at 1104.

## 2018-06-28 NOTE — H&P (Signed)
[place] [facility] [place] NAME: Brenda Parker    MR#:  [id number]  DATE OF BIRTH:  [birth year]  DATE OF ADMISSION:  06/28/2018  PRIMARY CARE PHYSICIAN: Dr. Harrel Lemon  REQUESTING/REFERRING PHYSICIAN: Dr. Rogue Bussing  CHIEF COMPLAINT:  Sent in for weakness and pneumonia  HISTORY OF PRESENT ILLNESS:  Brenda Parker  is a 82 y.o. female with a known history of stage III lung cancer.  She presents to the hospital after being sent in by Dr. Rogue Bussing for trouble breathing, shortness of breath, coughing, weakness and diarrhea.  She had a fall last night.  Was on the floor for 2 to 3 hours and is very weak.  She does not want to eat secondary to diarrhea.  Poor appetite.  30 to 40 pound weight loss.  They recently stopped her aspirin because she was coughing up some blood.  No blood in the bowel movements.  Patient having a shaking chill.  PAST MEDICAL HISTORY:   Past Medical History:  Diagnosis Date  . Benign liver cyst   . Breast cancer ([place]) 2005   right breast  . COPD (chronic obstructive pulmonary disease) ([place])   . Dyspnea   . Glaucoma   . Hemoptysis 2019  . Hypertension    Not on medication for htn. patient denies this  . Lung mass 01/2018  . Personal history of radiation therapy     PAST SURGICAL HISTORY:   Past Surgical History:  Procedure Laterality Date  . BREAST EXCISIONAL BIOPSY Right 2005   positive, radiation  . CHOLECYSTECTOMY  1988  . COLONOSCOPY  2012  . ENDOBRONCHIAL ULTRASOUND N/A 02/21/2018   Procedure: ENDOBRONCHIAL ULTRASOUND;  Surgeon: Laverle Hobby, MD;  Location: [facility] ORS;  Service: Pulmonary;  Laterality: N/A;  . EYE SURGERY Bilateral 2009,2010   cataract extractions with lens implant  . GALLBLADDER SURGERY Bilateral   . IR FLUORO GUIDE PORT INSERTION RIGHT  03/10/2018  . JOINT REPLACEMENT Left 2008   left hip replacement  . PORTACATH PLACEMENT  03/10/2018  . TONSILLECTOMY  1960    SOCIAL HISTORY:    Social History   Tobacco Use  . Smoking status: Former Smoker    Packs/day: 1.00    Types: Cigarettes    Last attempt to quit: 10/25/2005    Years since quitting: 12.6  . Smokeless tobacco: Never Used  Substance Use Topics  . Alcohol use: Not Currently    Comment: occasional glass of beer    FAMILY HISTORY:   Family History  Problem Relation Age of Onset  . Breast cancer Mother 94  . CAD Father     DRUG ALLERGIES:   Allergies  Allergen Reactions  . Penicillins Rash    Patient had a rash after an injection in 1973.  Unsure if taken orally will cause any reaction  Has patient had a PCN reaction causing immediate rash, facial/tongue/throat swelling, SOB or lightheadedness with hypotension: Yes Has patient had a PCN reaction causing severe rash involving mucus membranes or skin necrosis: No Has patient had a PCN reaction that required hospitalization: No Has patient had a PCN reaction occurring within the last 10 years: No     REVIEW OF SYSTEMS:  CONSTITUTIONAL: No fever, positive for shaking chill.  Positive for fatigue and weakness.  Positive for weight loss 30 to 40 pounds EYES: No blurred or double vision.  Wears glasses. EARS, NOSE, AND THROAT: No tinnitus or ear pain. No sore throat RESPIRATORY: Positive for cough and  shortness of breath.  History of hemoptysis.  CARDIOVASCULAR: No chest pain, orthopnea, edema.  GASTROINTESTINAL: No nausea, vomiting, or abdominal pain. No blood in bowel movements.  Positive for diarrhea. GENITOURINARY: No dysuria, hematuria.  ENDOCRINE: No polyuria, nocturia,  HEMATOLOGY: No anemia, easy bruising or bleeding SKIN: No rash or lesion. MUSCULOSKELETAL: Low back pain.  Patient's urinalysis is negative, chest x-ray showed no acute pathology NEUROLOGIC: No tingling, numbness, weakness.  PSYCHIATRY: No anxiety or depression.   MEDICATIONS AT HOME:   Prior to Admission medications   Medication Sig Start Date End Date Taking?  Authorizing Provider  albuterol (PROVENTIL HFA) 108 (90 Base) MCG/ACT inhaler Inhale 2 puffs into the lungs every 6 (six) hours as needed for wheezing or shortness of breath.  09/28/17 09/29/18  [provider]  Cholecalciferol (VITAMIN D3) 1000 units CAPS Take 3,000 Units by mouth daily.     [provider]  Fluticasone-Salmeterol (ADVAIR DISKUS) 500-50 MCG/DOSE AEPB Inhale 1 puff into the lungs 2 (two) times daily. 04/12/18   Jacquelin Hawking, NP  latanoprost (XALATAN) 0.005 % ophthalmic solution Place 1 drop into both eyes at bedtime. 12/25/17   [provider]  lidocaine-prilocaine (EMLA) cream Apply 1 application topically as needed. To port a cath site 03/06/18   [name], MD      VITAL SIGNS:  Blood pressure (!) 142/75, pulse (!) 111, temperature (!) 97.4 F (36.3 C), temperature source Oral, resp. rate 20, height 5\' 4"  (1.626 m), weight 65.3 kg, SpO2 100 %.  PHYSICAL EXAMINATION:  GENERAL:  82 y.o.-year-old patient lying in the bed with no acute distress.  EYES: Pupils equal, round, reactive to light and accommodation. No scleral icterus. Extraocular muscles intact.  HEENT: Head atraumatic, normocephalic. Oropharynx and nasopharynx clear.  NECK:  Supple, no jugular venous distention. No thyroid enlargement, no tenderness.  LUNGS: Psychosis decreased breath sounds bilaterally, no wheezing, rales,rhonchi or crepitation. No use of accessory muscles of respiration.  CARDIOVASCULAR: S1, S2 normal. No murmurs, rubs, or gallops.  ABDOMEN: Soft, nontender, nondistended. Bowel sounds present. No organomegaly or mass.  EXTREMITIES: No pedal edema, cyanosis, or clubbing.  NEUROLOGIC: Cranial nerves II through XII are intact. Muscle strength 5/5 in all extremities. Sensation intact. Gait not checked.  PSYCHIATRIC: The patient is alert and oriented x 3.  SKIN: No rash, lesion, or ulcer.   LABORATORY PANEL:   CBC Recent Labs  Lab 06/28/18 0933  WBC 20.0*   HGB 9.4*  HCT 28.5*  PLT 334   ------------------------------------------------------------------------------------------------------------------  Chemistries  Recent Labs  Lab 06/28/18 0933  NA 135  K 4.0  CL 101  CO2 20*  GLUCOSE 109*  BUN 30*  CREATININE 1.54*  CALCIUM 9.8  AST 35  ALT 20  ALKPHOS 91  BILITOT 0.9   ------------------------------------------------------------------------------------------------------------------    RADIOLOGY:  Chest x-ray ordered    IMPRESSION AND PLAN:   1.  Clinical sepsis with suspected pneumonia, tachycardia and leukocytosis.  Blood cultures and sputum culture ordered.  IV Levaquin ordered. 2.  Diarrhea.  Send off stool studies. 3.  Stage III lung cancer.  Patient wishes to be a DO NOT RESUSCITATE. 4.  History of hypertension recently taken off antihypertensive medications. 5.  COPD.  Continue inhalers. 6.  Acute kidney injury on chronic kidney disease stage III.  IV fluid hydration.  Encouraged eating.    All the records are reviewed and case discussed with ED provider. Management plans discussed with the patient, family and they are in agreement.  CODE STATUS: Full code  TOTAL TIME TAKING CARE OF THIS PATIENT: 50 minutes, including ACP time.    Loletha Grayer M.D on 06/28/2018 at 1:41 PM  Between 7am to 6pm - Pager - [phone]  After 6pm call admission pager [phone]  [facility]  [phone]  CC: Primary care physician; Dr. Harrel Lemon

## 2018-06-28 NOTE — Progress Notes (Addendum)
Pharmacy Antibiotic Note  Brenda Parker is a 82 y.o. female admitted on 06/28/2018 with pneumonia.  Pharmacy has been consulted for vancomycin dosing. 82 y.o. She has stage III lung cancer and presents to the hospital after being sent in by Dr. Rogue Bussing for trouble breathing, shortness of breath, coughing, weakness and diarrhea  Plan: Vancomycin 1000mg  IV every 24 hours beginning 14 hours after first dose (stacked dosing), K 0.029, Vd: 46L, T1/2: 24h, calculated concentrations at steady-state: 37.3/19.2 mcg/mL, Vt prior to 4th dose   Goal trough 15-20 mcg/mL.  Height: 5\' 4"  (162.6 cm) Weight: 144 lb (65.3 kg) IBW/kg (Calculated) : 54.7  Temp (24hrs), Avg:98.2 F (36.8 C), Min:97.4 F (36.3 C), Max:98.9 F (37.2 C)  Recent Labs  Lab 06/28/18 0933  WBC 20.0*  CREATININE 1.54*    Estimated Creatinine Clearance: 25.2 mL/min (A) (by C-G formula based on SCr of 1.54 mg/dL (H)).    Allergies  Allergen Reactions  . Penicillins Rash    Patient had a rash after an injection in 1973.  Unsure if taken orally will cause any reaction  Has patient had a PCN reaction causing immediate rash, facial/tongue/throat swelling, SOB or lightheadedness with hypotension: Yes Has patient had a PCN reaction causing severe rash involving mucus membranes or skin necrosis: No Has patient had a PCN reaction that required hospitalization: No Has patient had a PCN reaction occurring within the last 10 years: No     Antimicrobials this admission: vancomycin 9/4 >>  Levaquin 9/4 >>    Microbiology results: 9/4 BCx: pending 9/5 MRSA PCR: pending  Thank you for allowing pharmacy to be a part of this patient's care.  Dallie Piles, PharmD 06/28/2018 4:08 PM

## 2018-06-28 NOTE — Progress Notes (Signed)
Received phone call from [facility] in admitting. Patient will be admitted to room 106.  Spoke with [name], RN. 1134 am - RN will contact me at [phone] after am rounds for call report.

## 2018-06-28 NOTE — Progress Notes (Signed)
Accessed port prior to HP admission H20G port; blood return noted.  Called volunteer for transport at 1222 for volunteer to transport pt via w/c to [facility] mall admitting/reg. Desk.

## 2018-06-28 NOTE — Progress Notes (Signed)
Patient ID: Brenda Parker, female   DOB: [birth year], 82 y.o.   MRN: [id number]  ACP note  Patient and son at the bedside  Diagnosis: Clinical sepsis, suspected pneumonia, stage III lung cancer, COPD, history of hypertension, low back pain  CODE STATUS discussed and patient wishes to be a DO NOT RESUSCITATE  Plan.  Blood cultures and sputum culture and stool studies.  Empiric Levaquin.  Physical therapy evaluation.  Encouraged eating.  IV fluid hydration for dehydration.  Time spent on ACP discussion 17 minutes Dr. Loletha Grayer

## 2018-06-28 NOTE — Progress Notes (Signed)
[facility] OFFICE PROGRESS NOTE  Patient Care Team: Leonel Ramsay, MD as PCP - General (Infectious Diseases) Telford Nab, RN as Registered Nurse [name], MD as Medical Oncologist (Medical Oncology)  Cancer Staging No matching staging information was found for the patient.   Oncology History   # RUL LUNG CANCER-non-small cell; favor squamous cell; PET scan T4N0 [right upper lobe mass involving[mediastinal/blood results] vs small pleural effusion [?M1]   # Hx of Right breast ca [s/p lumpec; RT; No chemo; "pill" x5 years]  # COPD/Hx of smoking  MOLECULAR TESTING/OMNISEQ: PDL-1 25% [TPS; TMB-H; No targets**]  --------------------------------------------------    DIAGNOSIS: [ April 2019]SQUAMOUS CELL LUNG CA  STAGE: III ;GOALS: CURATIVE  CURRENT/MOST RECENT THERAPY [ May 2019]- Carbo-taxol with RT .       Primary cancer of right upper lobe of lung ([place])   03/06/2018 -  Chemotherapy    The patient had palonosetron (ALOXI) injection 0.25 mg, 0.25 mg, Intravenous,  Once, 9 of 9 cycles Administration: 0.25 mg (03/13/2018), 0.25 mg (04/10/2018), 0.25 mg (04/17/2018), 0.25 mg (03/21/2018), 0.25 mg (04/25/2018), 0.25 mg (03/27/2018), 0.25 mg (04/03/2018), 0.25 mg (05/01/2018), 0.25 mg (05/08/2018) CARBOplatin (PARAPLATIN) 130 mg in sodium chloride 0.9 % 250 mL chemo infusion, 130 mg (100 % of original dose 131.4 mg), Intravenous,  Once, 9 of 9 cycles Dose modification:   (original dose 131.4 mg, Cycle 1) Administration: 130 mg (03/13/2018), 130 mg (04/10/2018), 130 mg (04/17/2018), 130 mg (03/21/2018), 130 mg (04/25/2018), 130 mg (03/27/2018), 130 mg (04/03/2018), 130 mg (05/01/2018), 130 mg (05/08/2018) PACLitaxel (TAXOL) 78 mg in sodium chloride 0.9 % 250 mL chemo infusion (</= 45m/m2), 45 mg/m2 = 78 mg, Intravenous,  Once, 9 of 9 cycles Administration: 78 mg (03/13/2018), 78 mg (04/10/2018), 78 mg (04/17/2018), 78 mg (03/21/2018), 78 mg (04/25/2018), 78 mg (03/27/2018), 78 mg  (04/03/2018), 78 mg (05/01/2018), 78 mg (05/08/2018)  for chemotherapy treatment.        INTERVAL HISTORY: Patient is a vague historian/patient's son along with the patient  Brenda HAYDEN89y.o.  female pleasant patient above history of stage III lung cancer is here for follow-up.  Patient is currently status post chemoradiation/review the results of the restaging CT scan.  As per the son patient has had fall at home; feeling weak overall.  No fevers or chills.  Complains of worsening shortness of breath.  Complains of cough.  No hemoptysis.   2-3 loose stools.  Complains of significant fatigue.  Review of Systems  Constitutional: Positive for malaise/fatigue. Negative for chills, diaphoresis, fever and weight loss.  HENT: Negative for nosebleeds and sore throat.   Eyes: Negative for double vision.  Respiratory: Positive for cough and shortness of breath. Negative for hemoptysis, sputum production and wheezing.   Cardiovascular: Negative for chest pain, palpitations, orthopnea and leg swelling.  Gastrointestinal: Positive for diarrhea. Negative for abdominal pain, blood in stool, constipation, heartburn, melena, nausea and vomiting.  Genitourinary: Negative for dysuria, frequency and urgency.  Musculoskeletal: Negative for back pain and joint pain.  Skin: Negative.  Negative for itching and rash.  Neurological: Positive for dizziness. Negative for tingling, focal weakness, weakness and headaches.       Positive for fall.  Endo/Heme/Allergies: Does not bruise/bleed easily.  Psychiatric/Behavioral: Negative for depression. The patient is not nervous/anxious and does not have insomnia.       PAST MEDICAL HISTORY :  Past Medical History:  Diagnosis Date  . Benign liver cyst   . Breast  cancer [facility]) 2005   right breast  . COPD (chronic obstructive pulmonary disease) ([place])   . Dyspnea   . Glaucoma   . Hemoptysis 2019  . Hypertension    Not on medication for htn. patient denies this  .  Lung mass 01/2018  . Personal history of radiation therapy     PAST SURGICAL HISTORY :   Past Surgical History:  Procedure Laterality Date  . BREAST EXCISIONAL BIOPSY Right 2005   positive, radiation  . CHOLECYSTECTOMY  1988  . COLONOSCOPY  2012  . ENDOBRONCHIAL ULTRASOUND N/A 02/21/2018   Procedure: ENDOBRONCHIAL ULTRASOUND;  Surgeon: Laverle Hobby, MD;  Location: [facility] ORS;  Service: Pulmonary;  Laterality: N/A;  . EYE SURGERY Bilateral 2009,2010   cataract extractions with lens implant  . GALLBLADDER SURGERY Bilateral   . IR FLUORO GUIDE PORT INSERTION RIGHT  03/10/2018  . JOINT REPLACEMENT Left 2008   left hip replacement  . PORTACATH PLACEMENT  03/10/2018  . TONSILLECTOMY  1960    FAMILY HISTORY :   Family History  Problem Relation Age of Onset  . Breast cancer Mother 25    SOCIAL HISTORY:   Social History   Tobacco Use  . Smoking status: Former Smoker    Packs/day: 1.00    Types: Cigarettes    Last attempt to quit: 10/25/2005    Years since quitting: 12.6  . Smokeless tobacco: Never Used  Substance Use Topics  . Alcohol use: Not Currently    Comment: occasional glass of beer  . Drug use: Never    ALLERGIES:  is allergic to penicillins.  MEDICATIONS:  No current outpatient medications on file.   No current facility-administered medications for this visit.     PHYSICAL EXAMINATION: ECOG PERFORMANCE STATUS: 1 - Symptomatic but completely ambulatory  BP 119/69 (BP Location: Left Arm, Patient Position: Sitting)   Pulse (!) 114   Temp 98.9 F (37.2 C) (Tympanic)   Resp 16   SpO2 94%   There were no vitals filed for this visit.  Physical Exam  Constitutional: She is oriented to person, place, and time.  She feels weak.  She is in a wheelchair.  Accompanied by son.  HENT:  Head: Normocephalic and atraumatic.  Mouth/Throat: Oropharynx is clear and moist. No oropharyngeal exudate.  Eyes: Pupils are equal, round, and reactive to light.  Neck:  Normal range of motion. Neck supple.  Cardiovascular: Normal rate and regular rhythm.  Pulmonary/Chest: No respiratory distress. She has no wheezes.  Abdominal: Soft. Bowel sounds are normal. She exhibits no distension and no mass. There is no tenderness. There is no rebound and no guarding.  Musculoskeletal: Normal range of motion. She exhibits no edema or tenderness.  Neurological: She is alert and oriented to person, place, and time.  Skin: Skin is warm.  Psychiatric: Affect normal.       LABORATORY DATA:  I have reviewed the data as listed    Component Value Date/Time   NA 135 06/28/2018 0933   K 4.0 06/28/2018 0933   CL 101 06/28/2018 0933   CO2 20 (L) 06/28/2018 0933   GLUCOSE 109 (H) 06/28/2018 0933   BUN 30 (H) 06/28/2018 0933   CREATININE 1.54 (H) 06/28/2018 0933   CALCIUM 9.8 06/28/2018 0933   PROT 7.5 06/28/2018 0933   ALBUMIN 3.0 (L) 06/28/2018 0933   AST 35 06/28/2018 0933   ALT 20 06/28/2018 0933   ALKPHOS 91 06/28/2018 0933   BILITOT 0.9 06/28/2018 0933   GFRNONAA 31 (  L) 06/28/2018 0933   GFRAA 36 (L) 06/28/2018 0933    No results found for: SPEP, UPEP  Lab Results  Component Value Date   WBC 20.0 (H) 06/28/2018   NEUTROABS 18.3 (H) 06/28/2018   HGB 9.4 (L) 06/28/2018   HCT 28.5 (L) 06/28/2018   MCV 91.0 06/28/2018   PLT 334 06/28/2018      Chemistry      Component Value Date/Time   NA 135 06/28/2018 0933   K 4.0 06/28/2018 0933   CL 101 06/28/2018 0933   CO2 20 (L) 06/28/2018 0933   BUN 30 (H) 06/28/2018 0933   CREATININE 1.54 (H) 06/28/2018 0933      Component Value Date/Time   CALCIUM 9.8 06/28/2018 0933   ALKPHOS 91 06/28/2018 0933   AST 35 06/28/2018 0933   ALT 20 06/28/2018 0933   BILITOT 0.9 06/28/2018 0933       RADIOGRAPHIC STUDIES: I have personally reviewed the radiological images as listed and agreed with the findings in the report. No results found.   ASSESSMENT & PLAN:  Primary cancer of right upper lobe of lung  ([place]) #Right upper lobe lung cancer-squamous cell-stage III/ unresectable; on carboplatin Taxol with radiation; currently status post chemo-radiation July 18.  AUG 30th  2019-CT scan improved right upper lobe malignancy; however bilateral groundglass opacities/new right lower lobe 17 mm nodule [see discussion below]  #Hold off consolidation immunotherapy given acute illness/changes on the CT scan.  See discussion below  # falling/weakness/ bil ground glass opacities-? Pneumonia [more likely] vs drug reaction/radiation [less likely].  Recommend antibiotics IV Levaquin/cover atypical infections; if not improved steroids could be considered.  #Chronic kidney disease- slightly worse ; creatinine- 1.5.  IV fluids.  Long discussion with the patient and her son regarding the above concern.  Recommend admission to the hospital.  Discussed with Dr. Cyndi Bender agrees to admit the patient.  Follow-up to be based on discharge in the hospital.  # I reviewed the blood work- with the patient in detail; also reviewed the imaging independently [as summarized above]; and with the patient in detail.   Cc; Dr.Kolluru   No orders of the defined types were placed in this encounter.  All questions were answered. The patient knows to call the clinic with any problems, questions or concerns.      [name], MD 06/28/2018 12:53 PM

## 2018-06-28 NOTE — Progress Notes (Signed)
1210-call report given to [name], Therapist, sports. Port to be accessed per request of RN on 1C.  Volunteer arrived at 1230 - to transport patient to admitting reg. Desk.

## 2018-06-28 NOTE — Assessment & Plan Note (Addendum)
#  Right upper lobe lung cancer-squamous cell-stage III/ unresectable; on carboplatin Taxol with radiation; currently status post chemo-radiation July 18.  AUG 30th  2019-CT scan improved right upper lobe malignancy; however bilateral groundglass opacities/new right lower lobe 17 mm nodule [see discussion below]  #Hold off consolidation immunotherapy given acute illness/changes on the CT scan.  See discussion below  # falling/weakness/ bil ground glass opacities-? Pneumonia [more likely] vs drug reaction/radiation [less likely].  Recommend antibiotics IV Levaquin/cover atypical infections; if not improved steroids could be considered.  #Chronic kidney disease- slightly worse ; creatinine- 1.5.  IV fluids.  Long discussion with the patient and her son regarding the above concern.  Recommend admission to the hospital.  Discussed with Dr. Cyndi Bender agrees to admit the patient.  Follow-up to be based on discharge in the hospital.  # I reviewed the blood work- with the patient in detail; also reviewed the imaging independently [as summarized above]; and with the patient in detail.   Cc; Dr.Kolluru

## 2018-06-29 LAB — BASIC METABOLIC PANEL
Anion gap: 11 (ref 5–15)
BUN: 25 mg/dL — AB (ref 8–23)
CHLORIDE: 102 mmol/L (ref 98–111)
CO2: 21 mmol/L — AB (ref 22–32)
CREATININE: 1.27 mg/dL — AB (ref 0.44–1.00)
Calcium: 9.1 mg/dL (ref 8.9–10.3)
GFR calc Af Amer: 45 mL/min — ABNORMAL LOW (ref >60)
GFR calc non Af Amer: 39 mL/min — ABNORMAL LOW (ref >60)
Glucose, Bld: 105 mg/dL — ABNORMAL HIGH (ref 70–99)
Potassium: 3.3 mmol/L — ABNORMAL LOW (ref 3.5–5.1)
Sodium: 134 mmol/L — ABNORMAL LOW (ref 135–145)

## 2018-06-29 LAB — GASTROINTESTINAL PANEL BY PCR, STOOL (REPLACES STOOL CULTURE)

## 2018-06-29 LAB — C DIFFICILE QUICK SCREEN W PCR REFLEX
C DIFFICILE (CDIFF) TOXIN: NEGATIVE
C DIFFICLE (CDIFF) ANTIGEN: NEGATIVE
C Diff interpretation: NOT DETECTED

## 2018-06-29 LAB — CBC
HCT: 25.3 % — ABNORMAL LOW (ref 35.0–47.0)
Hemoglobin: 8.5 g/dL — ABNORMAL LOW (ref 12.0–16.0)
MCH: 30.5 pg (ref 26.0–34.0)
MCHC: 33.8 g/dL (ref 32.0–36.0)
MCV: 90.2 fL (ref 80.0–100.0)
PLATELETS: 275 10*3/uL (ref 150–440)
RBC: 2.8 MIL/uL — ABNORMAL LOW (ref 3.80–5.20)
RDW: 17.9 % — AB (ref 11.5–14.5)
WBC: 18.9 10*3/uL — ABNORMAL HIGH (ref 3.6–11.0)

## 2018-06-29 LAB — OCCULT BLOOD X 1 CARD TO LAB, STOOL: FECAL OCCULT BLD: NEGATIVE

## 2018-06-29 MED ORDER — ADULT MULTIVITAMIN W/MINERALS CH
1.0000 | ORAL_TABLET | Freq: Every day | ORAL | Status: DC
  Administered 2018-06-30: 1 via ORAL
  Filled 2018-06-29: qty 1

## 2018-06-29 MED ORDER — ALTEPLASE 2 MG IJ SOLR
2.0000 mg | Freq: Once | INTRAMUSCULAR | Status: AC
  Administered 2018-06-29: 06:00:00 2 mg
  Filled 2018-06-29: qty 2

## 2018-06-29 MED ORDER — SODIUM CHLORIDE 0.9% FLUSH
10.0000 mL | INTRAVENOUS | Status: DC | PRN
  Administered 2018-06-29: 20 mL
  Administered 2018-06-29: 06:00:00 40 mL
  Filled 2018-06-29: qty 40

## 2018-06-29 MED ORDER — NEPRO/CARBSTEADY PO LIQD
237.0000 mL | Freq: Two times a day (BID) | ORAL | Status: DC
  Administered 2018-06-30: 11:00:00 237 mL via ORAL

## 2018-06-29 NOTE — Progress Notes (Signed)
Patient refusing bed alarm. Educated on safety.

## 2018-06-29 NOTE — Progress Notes (Signed)
Initial Nutrition Assessment  DOCUMENTATION CODES:   Not applicable  INTERVENTION:   Nepro Shake po BID, each supplement provides 425 kcal and 19 grams protein  MVI daily  NUTRITION DIAGNOSIS:   Increased nutrient needs related to cancer and cancer related treatments as evidenced by increased estimated needs.  GOAL:   Patient will meet greater than or equal to 90% of their needs  MONITOR:   PO intake, Supplement acceptance, Labs, Weight trends, Skin, I & O's  REASON FOR ASSESSMENT:   Malnutrition Screening Tool    ASSESSMENT:   48 f/o female admiited for pneumonia w/ sepsis. Pt with h/o stage III lung cancer, COPD, hypertension, low back pain, and breast cancer s/p lumpectomy and XRT   Met with pt in room today. Pt reports intermittent poor appetite and oral intake pta. Pt reports her appetite is poor today. Pt eating <25% of meals. Pt does not drink supplements but is willing to try a Nepro flavor. Pt reports chronic loose stools. Per chart, pt lost 39lbs(21%) over the past year and 16lbs(10%) from February to June but has remained wt stable since June. RD discussed with pt the importance of adequate protein and calorie intake needed to preserve lean muscle. RD will order supplements and MVI to help pt meet her estimated needs.   Medications reviewed and include: vitamin D, NaCl _0 /hr  Labs reviewed: N 134(L), K 3.3(L), BUN 25(H), creat 1.27(H) Wbc- 18.9(H), Hgb 8.5(L), Hct 25.3(L)  NUTRITION - FOCUSED PHYSICAL EXAM:    Most Recent Value  Orbital Region  Mild depletion  Upper Arm Region  No depletion  Thoracic and Lumbar Region  No depletion  Buccal Region  No depletion  Temple Region  Mild depletion  Clavicle Bone Region  Mild depletion  Clavicle and Acromion Bone Region  Mild depletion  Scapular Bone Region  Mild depletion  Dorsal Hand  Mild depletion  Patellar Region  Mild depletion  Anterior Thigh Region  No depletion  Posterior Calf Region  Mild  depletion  Edema (RD Assessment)  None  Hair  Reviewed  Eyes  Reviewed  Mouth  Reviewed  Skin  Reviewed  Nails  Reviewed     Diet Order:   Diet Order            Diet regular Room service appropriate? Yes; Fluid consistency: Thin  Diet effective now             EDUCATION NEEDS:   Education needs have been addressed  Skin:  Skin Assessment: Reviewed RN Assessment  Last BM:  9/4- type 6  Height:   Ht Readings from Last 1 Encounters:  06/28/18 5' 4" (1.626 m)    Weight:   Wt Readings from Last 1 Encounters:  06/28/18 65.3 kg    Ideal Body Weight:  54.5 kg  BMI:  Body mass index is 24.72 kg/m.  Estimated Nutritional Needs:   Kcal:  1500-1700kcal/day   Protein:  78-92g/day   Fluid:  >1.6L/day   Koleen Distance MS, RD, LDN Pager #- [phone] Office#- [phone] After Hours Pager: [phone]

## 2018-06-29 NOTE — Progress Notes (Signed)
[facility] [place] NAME: Brenda Parker    MR#:  [id number]  DATE OF BIRTH:  [birth year]  SUBJECTIVE:  CHIEF COMPLAINT:  No chief complaint on file. Sent from oncology clinic because of trouble breathing and hypoxia.  Also had generalized weakness and a fall and could not get up from the floor last week.  Feels much better today.  REVIEW OF SYSTEMS:  CONSTITUTIONAL: No fever, fatigue or weakness.  EYES: No blurred or double vision.  EARS, NOSE, AND THROAT: No tinnitus or ear pain.  RESPIRATORY: No cough, shortness of breath, wheezing or hemoptysis.  CARDIOVASCULAR: No chest pain, orthopnea, edema.  GASTROINTESTINAL: No nausea, vomiting, diarrhea or abdominal pain.  GENITOURINARY: No dysuria, hematuria.  ENDOCRINE: No polyuria, nocturia,  HEMATOLOGY: No anemia, easy bruising or bleeding SKIN: No rash or lesion. MUSCULOSKELETAL: No joint pain or arthritis.   NEUROLOGIC: No tingling, numbness, weakness.  PSYCHIATRY: No anxiety or depression.   ROS  DRUG ALLERGIES:   Allergies  Allergen Reactions  . Penicillins Rash    Patient had a rash after an injection in 1973.  Unsure if taken orally will cause any reaction  Has patient had a PCN reaction causing immediate rash, facial/tongue/throat swelling, SOB or lightheadedness with hypotension: Yes Has patient had a PCN reaction causing severe rash involving mucus membranes or skin necrosis: No Has patient had a PCN reaction that required hospitalization: No Has patient had a PCN reaction occurring within the last 10 years: No     VITALS:  Blood pressure (!) 101/59, pulse 95, temperature 97.8 F (36.6 C), temperature source Oral, resp. rate 20, height 5\' 4"  (1.626 m), weight 65.3 kg, SpO2 97 %.  PHYSICAL EXAMINATION:  GENERAL:  82 y.o.-year-old patient lying in the bed with no acute distress.  EYES: Pupils equal, round, reactive to light and accommodation. No scleral icterus. Extraocular  muscles intact.  HEENT: Head atraumatic, normocephalic. Oropharynx and nasopharynx clear.  NECK:  Supple, no jugular venous distention. No thyroid enlargement, no tenderness.  LUNGS: Normal breath sounds bilaterally, no wheezing, some crepitation. No use of accessory muscles of respiration.  CARDIOVASCULAR: S1, S2 normal. No murmurs, rubs, or gallops.  ABDOMEN: Soft, nontender, nondistended. Bowel sounds present. No organomegaly or mass.  EXTREMITIES: No pedal edema, cyanosis, or clubbing.  NEUROLOGIC: Cranial nerves II through XII are intact. Muscle strength 4/5 in all extremities. Sensation intact. Gait not checked.  PSYCHIATRIC: The patient is alert and oriented x 3.  SKIN: No obvious rash, lesion, or ulcer.   Physical Exam LABORATORY PANEL:   CBC Recent Labs  Lab 06/29/18 0732  WBC 18.9*  HGB 8.5*  HCT 25.3*  PLT 275   ------------------------------------------------------------------------------------------------------------------  Chemistries  Recent Labs  Lab 06/28/18 0933 06/29/18 0732  NA 135 134*  K 4.0 3.3*  CL 101 102  CO2 20* 21*  GLUCOSE 109* 105*  BUN 30* 25*  CREATININE 1.54* 1.27*  CALCIUM 9.8 9.1  AST 35  --   ALT 20  --   ALKPHOS 91  --   BILITOT 0.9  --    ------------------------------------------------------------------------------------------------------------------  Cardiac Enzymes No results for input(s): TROPONINI in the last 168 hours. ------------------------------------------------------------------------------------------------------------------  RADIOLOGY:  Dg Chest 2 View  Result Date: 06/28/2018 CLINICAL DATA:  Cough. History of a lung mass, right breast malignancy, COPD, former smoker. EXAM: CHEST - 2 VIEW COMPARISON:  Chest CT scan of June 23, 2018 and chest x-ray of June 16, 2018 FINDINGS: The cavitary mass  like lesion in the right lower lobe has increased in size. The cavitary portion measures as much as 3.8 cm in diameter.  There is a stable density projecting over the posterolateral aspect of the right eighth rib and is stable. Density in the inferior aspect of the right upper lobe is fairly stable. The left lung is clear. The heart and pulmonary vascularity are normal. There is calcification in the wall of the aortic arch. The porta catheter tip projects over the midportion of the SVC. IMPRESSION: Interval increase in size of the cavitary process projecting in the right lower lobe. Chest CT scanning is recommended for better characterization of this lesion. Elsewhere the findings in the right lung are clear. Thoracic aortic atherosclerosis. Electronically Signed   By: David  Martinique M.D.   On: 06/28/2018 14:40    ASSESSMENT AND PLAN:   Active Problems:   Pneumonia  1.  Clinical sepsis with suspected pneumonia, tachycardia and leukocytosis.  Blood cultures and sputum culture ordered.  IV Levaquin ordered.  MRSA PCR negative- stopped vanc. 2.  Diarrhea.  Send off stool studies. Negative for c diff. 3.  Stage III lung cancer.  Patient wishes to be a DO NOT RESUSCITATE. 4.  History of hypertension recently taken off antihypertensive medications. 5.  COPD.  Continue inhalers. 6.  Acute kidney injury on chronic kidney disease stage III.  IV fluid hydration.  Encouraged eating.    renal func slight improved.  All the records are reviewed and case discussed with Care Management/Social Workerr. Management plans discussed with the patient, family and they are in agreement.  CODE STATUS: DNR  TOTAL TIME TAKING CARE OF THIS PATIENT: 35 minutes.   Patient was advised to go to SNF by physical therapy, but she lives alone and her son is nearby and can go to her house at any time.  Patient also feels confident that she would be able to walk with help of a walker without falling down now so 80 able to go home with home health agencies letter than going to a rehab center.  POSSIBLE D/C IN 1 DAYS, DEPENDING ON CLINICAL  CONDITION.   Vaughan Basta M.D on 06/29/2018   Between 7am to 6pm - Pager - [phone]  After 6pm go to [email or web address] - password [email or web address] [facility]  Office  [phone]  CC: Primary care physician; Leonel Ramsay, MD  Note: This dictation was prepared with Dragon dictation along with smaller phrase technology. Any transcriptional errors that result from this process are unintentional.

## 2018-06-29 NOTE — Clinical Social Work Note (Signed)
Clinical Social Work Assessment  Patient Details  Name: Brenda Parker MRN: [id number] Date of Birth: 1935-11-11  Date of referral:  06/29/18               Reason for consult:  Facility Placement                Permission sought to share information with:  Case Manager, Customer service manager, Family Supports Permission granted to share information::  Yes, Verbal Permission Granted  Name::        Agency::     Relationship::     Contact Information:     Housing/Transportation Living arrangements for the past 2 months:  Single Family Home Source of Information:  Patient Patient Interpreter Needed:  None Criminal Activity/Legal Involvement Pertinent to Current Situation/Hospitalization:  No - Comment as needed Significant Relationships:  Adult Children Lives with:  Self Do you feel safe going back to the place where you live?  Yes Need for family participation in patient care:  No (Coment)  Care giving concerns:  Patient lives alone    Facilities manager / plan:  CSW consulted for SNF placement. CSW met with patient to discuss discharge plan. CSW introduced self and explained role. Patient states that she lives alone. Patient also states that she has a son that is a Engineer, structural that helps her when he is able. CSW explained that PT is recommending SNF for rehab. Patient states that she does not want to go to SNF and would prefer to go home and have home health. CSW notified RNCM of above. Patient will go home with home health when medically ready.   Employment status:  Retired Forensic scientist:  [employer] PT Recommendations:  [place] / Referral to community resources:  [place]  Patient/Family's Response to care:  Patient thanked CSW for assistance   Patient/Family's Understanding of and Emotional Response to Diagnosis, Current Treatment, and Prognosis:  Patient is understanding of her current treatment and wants to  return home   Emotional Assessment Appearance:  Appears younger than stated age Attitude/Demeanor/Rapport:    Affect (typically observed):  Accepting, Pleasant Orientation:  Oriented to Self, Oriented to  Time, Oriented to Situation, Oriented to Place Alcohol / Substance use:  Not Applicable Psych involvement (Current and /or in the community):  No (Comment)  Discharge Needs  Concerns to be addressed:  Discharge Planning Concerns Readmission within the last 30 days:  No Current discharge risk:  None Barriers to Discharge:  Continued Medical Work up   [employer], [place] 06/29/2018, 2:08 PM

## 2018-06-29 NOTE — Progress Notes (Signed)
IV team has finished with port (tPA removed) and said it is okay to use again.

## 2018-06-29 NOTE — Evaluation (Signed)
Physical Therapy Evaluation Patient Details Name: Brenda Parker MRN: [id number] DOB: [birth year] Today's Date: 06/29/2018   History of Present Illness  82 y.o. female with a known history of stage III lung cancer.  She presents to the hospital after being sent in by Dr. Rogue Bussing for trouble breathing, shortness of breath, coughing, weakness and diarrhea.  She had a fall last night.  Was on the floor for 2 to 3 hours and is very weak.    Clinical Impression  Pt initially did well with PT exam, but struggled with prolonged ambulation. She was able initiate ambulation w/o AD confidently but quickly not only fatigued but started having increased buckling and stagger steps and needed direct assist from PT multiple times to insure she stayed upright (~10 minutes gait training apart from PT exam).  Pt would struggle at home in current state, is not at her baseline and per today's performance will need STR on d/c.      Follow Up Recommendations SNF(per progress; she would like to go home)    Equipment Recommendations  (discussed getting a cane)    Recommendations for Other Services       Precautions / Restrictions Precautions Precautions: Fall Restrictions Weight Bearing Restrictions: No      Mobility  Bed Mobility Overal bed mobility: Independent             General bed mobility comments: Pt needed rails to get to sitting, but no direct assist from PT  Transfers Overall transfer level: Modified independent Equipment used: None             General transfer comment: Pt is able to rise to standing with good relatively confidence and no direct assist  Ambulation/Gait Ambulation/Gait assistance: Mod assist Gait Distance (Feet): 100 Feet Assistive device: None       General Gait Details: Pt inconsistent t/o the bout of ambulation.  She initially showed good effort and confidence, but had multiple bouts of buckling, mis-steps and generally was not nearly as safe as her  baseline.  Pt needed direct assist on multiple occasions to insure than she did not have any full LOBs.  Pt overall weak and limited with poor prolonged ambulation tolerance and was unsafe w/o AD.   Stairs            Wheelchair Mobility    Modified Rankin (Stroke Patients Only)       Balance Overall balance assessment: Needs assistance Sitting-balance support: Feet supported Sitting balance-Leahy Scale: Good       Standing balance-Leahy Scale: Fair Standing balance comment: Pt's static standing was relatively good, however dynamically she had many stagger steps and bouts of buckling.                             Pertinent Vitals/Pain Pain Assessment: No/denies pain    Home Living Family/patient expects to be discharged to:: Private residence Living Arrangements: Alone Available Help at Discharge: Available PRN/intermittently;Family;Friend(s)   Home Access: Level entry(pt reports small threshold to enter home)       Home Equipment: Walker - 2 wheels(does not use)      Prior Function Level of Independence: Independent         Comments: Pt reports not being too active, but able to do all she needs     Hand Dominance        Extremity/Trunk Assessment   Upper Extremity Assessment Upper Extremity Assessment: Generalized weakness  Lower Extremity Assessment Lower Extremity Assessment: Generalized weakness       Communication   Communication: No difficulties  Cognition Arousal/Alertness: Awake/alert Behavior During Therapy: WFL for tasks assessed/performed Overall Cognitive Status: Within Functional Limits for tasks assessed                                        General Comments      Exercises     Assessment/Plan    PT Assessment Patient needs continued PT services  PT Problem List Decreased strength;Decreased range of motion;Decreased activity tolerance;Decreased balance;Decreased mobility;Decreased  coordination;Decreased knowledge of use of DME;Decreased safety awareness       PT Treatment Interventions DME instruction;Gait training;Stair training;Functional mobility training;Therapeutic activities;Therapeutic exercise;Balance training;Neuromuscular re-education;Patient/family education    PT Goals (Current goals can be found in the Care Plan section)  Acute Rehab PT Goals Patient Stated Goal: go home PT Goal Formulation: With patient Time For Goal Achievement: 07/13/18 Potential to Achieve Goals: Good    Frequency Min 2X/week   Barriers to discharge        Co-evaluation               AM-PAC PT "6 Clicks" Daily Activity  Outcome Measure Difficulty turning over in bed (including adjusting bedclothes, sheets and blankets)?: None Difficulty moving from lying on back to sitting on the side of the bed? : None Difficulty sitting down on and standing up from a chair with arms (e.g., wheelchair, bedside commode, etc,.)?: None Help needed moving to and from a bed to chair (including a wheelchair)?: None Help needed walking in hospital room?: A Little Help needed climbing 3-5 steps with a railing? : A Lot 6 Click Score: 21    End of Session Equipment Utilized During Treatment: Gait belt Activity Tolerance: Patient limited by fatigue Patient left: with bed alarm set;with call bell/phone within reach Nurse Communication: Mobility status PT Visit Diagnosis: Difficulty in walking, not elsewhere classified (R26.2);Muscle weakness (generalized) (M62.81);History of falling (Z91.81)    Time: 6767-2094 PT Time Calculation (min) (ACUTE ONLY): 28 min   Charges:   PT Evaluation $PT Eval Low Complexity: 1 Low PT Treatments $Gait Training: 8-22 mins        Kreg Shropshire, DPT 06/29/2018, 11:54 AM

## 2018-06-30 ENCOUNTER — Telehealth: Payer: Self-pay | Admitting: *Deleted

## 2018-06-30 LAB — BLOOD CULTURE ID PANEL (REFLEXED)
ACINETOBACTER BAUMANNII: NOT DETECTED
CANDIDA ALBICANS: NOT DETECTED
CANDIDA GLABRATA: NOT DETECTED
CANDIDA KRUSEI: NOT DETECTED
Candida parapsilosis: NOT DETECTED
Candida tropicalis: NOT DETECTED
Carbapenem resistance: NOT DETECTED
ENTEROBACTER CLOACAE COMPLEX: NOT DETECTED
ENTEROBACTERIACEAE SPECIES: DETECTED — AB
ESCHERICHIA COLI: NOT DETECTED
Enterococcus species: NOT DETECTED
Haemophilus influenzae: NOT DETECTED
KLEBSIELLA OXYTOCA: NOT DETECTED
Klebsiella pneumoniae: NOT DETECTED
LISTERIA MONOCYTOGENES: NOT DETECTED
Neisseria meningitidis: NOT DETECTED
PSEUDOMONAS AERUGINOSA: NOT DETECTED
Proteus species: NOT DETECTED
STREPTOCOCCUS PNEUMONIAE: NOT DETECTED
STREPTOCOCCUS PYOGENES: NOT DETECTED
Serratia marcescens: DETECTED — AB
Staphylococcus aureus (BCID): NOT DETECTED
Staphylococcus species: NOT DETECTED
Streptococcus agalactiae: NOT DETECTED
Streptococcus species: NOT DETECTED

## 2018-06-30 MED ORDER — HEPARIN SOD (PORK) LOCK FLUSH 100 UNIT/ML IV SOLN
500.0000 [IU] | Freq: Once | INTRAVENOUS | Status: AC
  Administered 2018-06-30: 12:00:00 500 [IU] via INTRAVENOUS
  Filled 2018-06-30: qty 5

## 2018-06-30 MED ORDER — ADULT MULTIVITAMIN W/MINERALS CH: 1.0000 | ORAL_TABLET | Freq: Every day | ORAL | 0 refills | Status: DC

## 2018-06-30 MED ORDER — OXYCODONE HCL 5 MG PO TABS: 5.0000 mg | ORAL_TABLET | Freq: Two times a day (BID) | ORAL | 0 refills | Status: DC | PRN

## 2018-06-30 MED ORDER — NEPRO/CARBSTEADY PO LIQD: 237.0000 mL | Freq: Two times a day (BID) | ORAL | 0 refills | Status: DC

## 2018-06-30 MED ORDER — LEVOFLOXACIN 500 MG PO TABS: 500.0000 mg | ORAL_TABLET | Freq: Every day | ORAL | 0 refills | Status: AC

## 2018-06-30 NOTE — Progress Notes (Signed)
PHARMACY - PHYSICIAN COMMUNICATION CRITICAL VALUE ALERT - BLOOD CULTURE IDENTIFICATION (BCID)  Results for orders placed or performed during the hospital encounter of 06/28/18  Blood Culture ID Panel (Reflexed) (Collected: 06/28/2018  2:18 PM)  Result Value Ref Range   Enterococcus species NOT DETECTED NOT DETECTED   Listeria monocytogenes NOT DETECTED NOT DETECTED   Staphylococcus species NOT DETECTED NOT DETECTED   Staphylococcus aureus NOT DETECTED NOT DETECTED   Streptococcus species NOT DETECTED NOT DETECTED   Streptococcus agalactiae NOT DETECTED NOT DETECTED   Streptococcus pneumoniae NOT DETECTED NOT DETECTED   Streptococcus pyogenes NOT DETECTED NOT DETECTED   Acinetobacter baumannii NOT DETECTED NOT DETECTED   Enterobacteriaceae species DETECTED (A) NOT DETECTED   Enterobacter cloacae complex NOT DETECTED NOT DETECTED   Escherichia coli NOT DETECTED NOT DETECTED   Klebsiella oxytoca NOT DETECTED NOT DETECTED   Klebsiella pneumoniae NOT DETECTED NOT DETECTED   Proteus species NOT DETECTED NOT DETECTED   Serratia marcescens DETECTED (A) NOT DETECTED   Carbapenem resistance NOT DETECTED NOT DETECTED   Haemophilus influenzae NOT DETECTED NOT DETECTED   Neisseria meningitidis NOT DETECTED NOT DETECTED   Pseudomonas aeruginosa NOT DETECTED NOT DETECTED   Candida albicans NOT DETECTED NOT DETECTED   Candida glabrata NOT DETECTED NOT DETECTED   Candida krusei NOT DETECTED NOT DETECTED   Candida parapsilosis NOT DETECTED NOT DETECTED   Candida tropicalis NOT DETECTED NOT DETECTED    Name of physician (or Provider) Contacted: Fritzi Mandes   Changes to prescribed antibiotics required: No, pt was d/c'd on levaquin which should cover   Sherel Fennell D 06/30/2018  7:35 PM

## 2018-06-30 NOTE — Progress Notes (Signed)
Patient discharged home with home health. Discharge instructions given to patient and son and all questions answered. Prescriptions given to patient.

## 2018-06-30 NOTE — Care Management Note (Signed)
Case Management Note  Patient Details  Name: Brenda Parker MRN: [id number] Date of Birth: 12/24/1935  Subjective/Objective:                  Admitted to [facility] with the diagnosis of pneumonia. Lives alone. Son is [name]. Appointment with Dr. Harrel Lemon  07/10/18. Prescriptions are filled at [facility] and [facility]. Home health in the past. Doesn't remember name of agency. No skilled facility. No home oxygen. The only equipment in the home is a bike.  [place] prior to this admission. Lost 40 pounds the last 4 months. Port placed in May and started chemotherapy.  Son will transport  Action/Plan:  Physical therapy is recommending skilled nursing facility. Prefers home with home health. No preference with agencies. Needs rolling walker   Expected Discharge Date:  06/30/18               Expected Discharge Plan:     In-House Referral:   yes  Discharge planning Services   yes  Post Acute Care Choice:   yes Choice offered to:   [name]  DME Arranged:   yes DME Agency:   [facility]  HH Arranged:   yes [place] Agency:   [facility]  Status of Service:     If discussed at [place] of Stay Meetings, dates discussed:    Additional Comments:  Shelbie Ammons, RN MSN CCM Care Management [phone] 06/30/2018, 8:55 AM

## 2018-06-30 NOTE — Discharge Summary (Signed)
[facility] NAME: Brenda Parker    MR#:  [id number]  DATE OF BIRTH:  05/06/1936  DATE OF ADMISSION:  06/28/2018 ADMITTING PHYSICIAN: Loletha Grayer, MD  DATE OF DISCHARGE: 06/30/2018   PRIMARY CARE PHYSICIAN: Leonel Ramsay, MD    ADMISSION DIAGNOSIS:  Pneumonia   DISCHARGE DIAGNOSIS:  Active Problems:   Pneumonia   SECONDARY DIAGNOSIS:   Past Medical History:  Diagnosis Date  . Benign liver cyst   . Breast cancer ([place]) 2005   right breast  . COPD (chronic obstructive pulmonary disease) ([place])   . Dyspnea   . Glaucoma   . Hemoptysis 2019  . Hypertension    Not on medication for htn. patient denies this  . Lung mass 01/2018  . Personal history of radiation therapy     HOSPITAL COURSE:   1. Clinical sepsis with  pneumonia, tachycardia and leukocytosis. Blood cultures and sputum culture ordered. IV Levaquin ordered.  MRSA PCR negative- stopped vanc. Much improved, discharged on oral Levaquin. 2. Diarrhea. Send off stool studies. Negative for c diff. 3. Stage III lung cancer. Patient wishes to be a DO NOT RESUSCITATE. 4. History of hypertension recently taken off antihypertensive medications. 5. COPD. Continue inhalers. 6. Acute kidney injury on chronic kidney disease stage III. IV fluid hydration. Encouraged eating.   renal func slight improved.  She was advised by physical therapy to be discharged to rehab center, but patient feels confident that she would be able to manage herself and we are discharging her home with home health.  DISCHARGE CONDITIONS:   Stable.  CONSULTS OBTAINED:    DRUG ALLERGIES:   Allergies  Allergen Reactions  . Penicillins Rash    Patient had a rash after an injection in 1973.  Unsure if taken orally will cause any reaction  Has patient had a PCN reaction causing immediate rash, facial/tongue/throat swelling, SOB or lightheadedness with hypotension:  Yes Has patient had a PCN reaction causing severe rash involving mucus membranes or skin necrosis: No Has patient had a PCN reaction that required hospitalization: No Has patient had a PCN reaction occurring within the last 10 years: No     DISCHARGE MEDICATIONS:   Allergies as of 06/30/2018      Reactions   Penicillins Rash   Patient had a rash after an injection in 1973.  Unsure if taken orally will cause any reaction  Has patient had a PCN reaction causing immediate rash, facial/tongue/throat swelling, SOB or lightheadedness with hypotension: Yes Has patient had a PCN reaction causing severe rash involving mucus membranes or skin necrosis: No Has patient had a PCN reaction that required hospitalization: No Has patient had a PCN reaction occurring within the last 10 years: No      Medication List    TAKE these medications   feeding supplement (NEPRO CARB STEADY) Liqd Take 237 mLs by mouth 2 (two) times daily between meals.   Fluticasone-Salmeterol 500-50 MCG/DOSE Aepb Commonly known as:  ADVAIR Inhale 1 puff into the lungs 2 (two) times daily.   latanoprost 0.005 % ophthalmic solution Commonly known as:  XALATAN Place 1 drop into both eyes at bedtime.   levofloxacin 500 MG tablet Commonly known as:  LEVAQUIN Take 1 tablet (500 mg total) by mouth daily for 3 days.   lidocaine-prilocaine cream Commonly known as:  EMLA Apply 1 application topically as needed. To port a cath site   multivitamin with minerals Tabs tablet Take 1  tablet by mouth daily.   oxyCODONE 5 MG immediate release tablet Commonly known as:  Oxy IR/ROXICODONE Take 1 tablet (5 mg total) by mouth 2 (two) times daily as needed for moderate pain or severe pain.   PROVENTIL HFA 108 (90 Base) MCG/ACT inhaler Generic drug:  albuterol Inhale 2 puffs into the lungs every 6 (six) hours as needed for wheezing or shortness of breath.   Vitamin D3 1000 units Caps Take 3,000 Units by mouth daily.             Durable Medical Equipment  (From admission, onward)         Start     Ordered   06/30/18 [phone]  For home use only DME Gilford Rile  [facility])  Once    Question:  Patient needs a walker to treat with the following condition  Answer:  Weakness   06/30/18 0833           DISCHARGE INSTRUCTIONS:   Follow with PMD in 1-2 weeks. Follow with cancer center in 1 week.  If you experience worsening of your admission symptoms, develop shortness of breath, life threatening emergency, suicidal or homicidal thoughts you must seek medical attention immediately by calling 911 or calling your MD immediately  if symptoms less severe.  You Must read complete instructions/literature along with all the possible adverse reactions/side effects for all the Medicines you take and that have been prescribed to you. Take any new Medicines after you have completely understood and accept all the possible adverse reactions/side effects.   Please note  You were cared for by a hospitalist during your hospital stay. If you have any questions about your discharge medications or the care you received while you were in the hospital after you are discharged, you can call the unit and asked to speak with the hospitalist on call if the hospitalist that took care of you is not available. Once you are discharged, your primary care physician will handle any further medical issues. Please note that NO REFILLS for any discharge medications will be authorized once you are discharged, as it is imperative that you return to your primary care physician (or establish a relationship with a primary care physician if you do not have one) for your aftercare needs so that they can reassess your need for medications and monitor your lab values.    Today   CHIEF COMPLAINT:  No chief complaint on file.   HISTORY OF PRESENT ILLNESS:  Brenda Parker  is a 82 y.o. female with a known history of stage III lung cancer.  She presents to the hospital  after being sent in by Dr. Rogue Bussing for trouble breathing, shortness of breath, coughing, weakness and diarrhea.  She had a fall last night.  Was on the floor for 2 to 3 hours and is very weak.  She does not want to eat secondary to diarrhea.  Poor appetite.  30 to 40 pound weight loss.  They recently stopped her aspirin because she was coughing up some blood.  No blood in the bowel movements.  Patient having a shaking chill.  VITAL SIGNS:  Blood pressure 116/61, pulse 88, temperature 97.9 F (36.6 C), temperature source Oral, resp. rate 18, height 5\' 4"  (1.626 m), weight 65.3 kg, SpO2 95 %.  I/O:    Intake/Output Summary (Last 24 hours) at 06/30/2018 1313 Last data filed at 06/30/2018 0319 Gross per 24 hour  Intake 825.1 ml  Output 250 ml  Net 575.1 ml  PHYSICAL EXAMINATION:  GENERAL:  82 y.o.-year-old patient lying in the bed with no acute distress.  EYES: Pupils equal, round, reactive to light and accommodation. No scleral icterus. Extraocular muscles intact.  HEENT: Head atraumatic, normocephalic. Oropharynx and nasopharynx clear.  NECK:  Supple, no jugular venous distention. No thyroid enlargement, no tenderness.  LUNGS: Normal breath sounds bilaterally, no wheezing, rales,rhonchi or crepitation. No use of accessory muscles of respiration.  CARDIOVASCULAR: S1, S2 normal. No murmurs, rubs, or gallops.  ABDOMEN: Soft, non-tender, non-distended. Bowel sounds present. No organomegaly or mass.  EXTREMITIES: No pedal edema, cyanosis, or clubbing.  NEUROLOGIC: Cranial nerves II through XII are intact. Muscle strength 5/5 in all extremities. Sensation intact. Gait not checked.  PSYCHIATRIC: The patient is alert and oriented x 3.  SKIN: No obvious rash, lesion, or ulcer.   DATA REVIEW:   CBC Recent Labs  Lab 06/29/18 0732  WBC 18.9*  HGB 8.5*  HCT 25.3*  PLT 275    Chemistries  Recent Labs  Lab 06/28/18 0933 06/29/18 0732  NA 135 134*  K 4.0 3.3*  CL 101 102  CO2 20* 21*   GLUCOSE 109* 105*  BUN 30* 25*  CREATININE 1.54* 1.27*  CALCIUM 9.8 9.1  AST 35  --   ALT 20  --   ALKPHOS 91  --   BILITOT 0.9  --     Cardiac Enzymes No results for input(s): TROPONINI in the last 168 hours.  Microbiology Results  Results for orders placed or performed during the hospital encounter of 06/28/18  CULTURE, BLOOD (ROUTINE X 2) w Reflex to ID Panel     Status: None (Preliminary result)   Collection Time: 06/28/18  2:18 PM  Result Value Ref Range Status   Specimen Description BLOOD LEFT ANTECUBITAL  Final   Special Requests   Final    BOTTLES DRAWN AEROBIC AND ANAEROBIC Blood Culture adequate volume   Culture   Final    NO GROWTH 2 DAYS Performed at [facility], [address], [place], [place] [address]    Report Status PENDING  Incomplete  CULTURE, BLOOD (ROUTINE X 2) w Reflex to ID Panel     Status: None (Preliminary result)   Collection Time: 06/28/18  2:18 PM  Result Value Ref Range Status   Specimen Description BLOOD RIGHT ANTECUBITAL  Final   Special Requests   Final    BOTTLES DRAWN AEROBIC AND ANAEROBIC Blood Culture adequate volume   Culture   Final    NO GROWTH 2 DAYS Performed at [facility], [address] [place], [place] [address]    Report Status PENDING  Incomplete  MRSA PCR Screening     Status: None   Collection Time: 06/28/18  4:55 PM  Result Value Ref Range Status   MRSA by PCR NEGATIVE NEGATIVE Final    Comment:        The GeneXpert MRSA Assay (FDA approved for NASAL specimens only), is one component of a comprehensive MRSA colonization surveillance program. It is not intended to diagnose MRSA infection nor to guide or monitor treatment for MRSA infections. Performed at [facility], [address] [place],  [address]   Gastrointestinal Panel by PCR , Stool     Status: None   Collection Time: 06/29/18 10:44 AM  Result Value Ref Range Status   Campylobacter species NOT DETECTED NOT  DETECTED Final   Plesimonas shigelloides NOT DETECTED NOT DETECTED Final   Salmonella species NOT DETECTED NOT DETECTED  Final   Yersinia enterocolitica NOT DETECTED NOT DETECTED Final   Vibrio species NOT DETECTED NOT DETECTED Final   Vibrio cholerae NOT DETECTED NOT DETECTED Final   Enteroaggregative E coli (EAEC) NOT DETECTED NOT DETECTED Final   Enteropathogenic E coli (EPEC) NOT DETECTED NOT DETECTED Final   Enterotoxigenic E coli (ETEC) NOT DETECTED NOT DETECTED Final   Shiga like toxin producing E coli (STEC) NOT DETECTED NOT DETECTED Final   Shigella/Enteroinvasive E coli (EIEC) NOT DETECTED NOT DETECTED Final   Cryptosporidium NOT DETECTED NOT DETECTED Final   Cyclospora cayetanensis NOT DETECTED NOT DETECTED Final   Entamoeba histolytica NOT DETECTED NOT DETECTED Final   Giardia lamblia NOT DETECTED NOT DETECTED Final   Adenovirus F40/41 NOT DETECTED NOT DETECTED Final   Astrovirus NOT DETECTED NOT DETECTED Final   Norovirus GI/GII NOT DETECTED NOT DETECTED Final   Rotavirus A NOT DETECTED NOT DETECTED Final   Sapovirus (I, II, IV, and V) NOT DETECTED NOT DETECTED Final    Comment: Performed at [facility], [address] [place], [place] [address]  C difficile quick scan w PCR reflex     Status: None   Collection Time: 06/29/18 10:44 AM  Result Value Ref Range Status   C Diff antigen NEGATIVE NEGATIVE Final   C Diff toxin NEGATIVE NEGATIVE Final   C Diff interpretation No C. difficile detected.  Final    Comment: Performed at [facility], [address] [place], [place] [address]    RADIOLOGY:  Dg Chest 2 View  Result Date: 06/28/2018 CLINICAL DATA:  Cough. History of a lung mass, right breast malignancy, COPD, former smoker. EXAM: CHEST - 2 VIEW COMPARISON:  Chest CT scan of June 23, 2018 and chest x-ray of June 16, 2018 FINDINGS: The cavitary mass like lesion in the right lower lobe has increased in size. The cavitary portion measures as much as  3.8 cm in diameter. There is a stable density projecting over the posterolateral aspect of the right eighth rib and is stable. Density in the inferior aspect of the right upper lobe is fairly stable. The left lung is clear. The heart and pulmonary vascularity are normal. There is calcification in the wall of the aortic arch. The porta catheter tip projects over the midportion of the SVC. IMPRESSION: Interval increase in size of the cavitary process projecting in the right lower lobe. Chest CT scanning is recommended for better characterization of this lesion. Elsewhere the findings in the right lung are clear. Thoracic aortic atherosclerosis. Electronically Signed   By: David  Martinique M.D.   On: 06/28/2018 14:40    EKG:   Orders placed or performed during the hospital encounter of 02/14/18  . EKG 12-Lead  . EKG 12-Lead      Management plans discussed with the patient, family and they are in agreement.  CODE STATUS: DNR    Code Status Orders  (From admission, onward)         Start     Ordered   06/28/18 1327  Do not attempt resuscitation (DNR)  Continuous    Question Answer Comment  In the event of cardiac or respiratory ARREST Do not call a "code blue"   In the event of cardiac or respiratory ARREST Do not perform Intubation, CPR, defibrillation or ACLS   In the event of cardiac or respiratory ARREST Use medication by any route, position, wound care, and other measures to relive pain and suffering. May use oxygen, suction and manual treatment  of airway obstruction as needed for comfort.   Comments nurse may pronounce      06/28/18 1327        Code Status History    This patient has a current code status but no historical code status.    Advance Directive Documentation     Most Recent Value  Type of Advance Directive  Healthcare Power of Attorney, Living will  Pre-existing out of facility DNR order (yellow form or pink MOST form)  -  "MOST" Form in Place?  -      TOTAL TIME  TAKING CARE OF THIS PATIENT: 35 minutes.    Vaughan Basta M.D on 06/30/2018 at 1:13 PM  Between 7am to 6pm - Pager - [phone]  After 6pm go to [email or web address] - password [email or web address] [facility]  Office  [phone]  CC: Primary care physician; Leonel Ramsay, MD   Note: This dictation was prepared with Dragon dictation along with smaller phrase technology. Any transcriptional errors that result from this process are unintentional.

## 2018-06-30 NOTE — Telephone Encounter (Signed)
Patient's son came to cancer center with multiple concerns about his mother. Pt was d/c from [facility] today. Refusing home health nurse but allowing PT.  Pt refusing to intake fluids in 'fear that she will be incontinent." Son feels that patient is declining and wanted to know if his mother would "die soon." son voices concerns about future f/u being 2 weeks out instead of next week. Son states that he is "concerned that the providers were never able to rule out a definite diagnosis of pneumonia for mom." Stated that his mother has an apt on Monday at pcp office for repeat lab draws. He is concerned that apt with Dr. b is too  Far out. He states that multiple family members (including his x-wife) are concerned of patient's declination of health and are telling him that hospice is needed. Discussed the importance of home health nurse coming to patient's home. Patient is still alert/oriented and in her right sound mind to make the decisions for herself. Discussed that patient has the right to refuse home health. Patient's son states that she has all the equipment to ambulate at home, but he is also trying to coordinate a family member to sit with the patient as much as possible. Certainly, the patient has had an acute decline in health. Discussed the role of palliative care if needed. However, I would encourage the patient to have the home health nurse to come to her home for an environmental surveillance and to see if there are other needs such as bathing etc. Son would like Dr. B to discuss patient's prognosis at next visit. RN Spoke with Dr. Rogue Bussing - md would like to see the patient on 9/12 at 69 am. I told the son that if patient's symptoms worsen over the weekend - that he would need to bring patient back to ER. If patient is feeling bad on Monday, he will contact our office on Monday to let us know how his mother feels. He is aware that symptom mgmt NP could evaluate the patient if patient needs IV fluids sooner.   Otherwise, he will bring his mother to the schedule apt on Thursday next week.

## 2018-07-02 DIAGNOSIS — J189 Pneumonia, unspecified organism: Secondary | ICD-10-CM | POA: Diagnosis not present

## 2018-07-02 DIAGNOSIS — Z853 Personal history of malignant neoplasm of breast: Secondary | ICD-10-CM | POA: Diagnosis not present

## 2018-07-02 DIAGNOSIS — C3490 Malignant neoplasm of unspecified part of unspecified bronchus or lung: Secondary | ICD-10-CM | POA: Diagnosis not present

## 2018-07-02 DIAGNOSIS — N183 Chronic kidney disease, stage 3 (moderate): Secondary | ICD-10-CM | POA: Diagnosis not present

## 2018-07-02 DIAGNOSIS — I129 Hypertensive chronic kidney disease with stage 1 through stage 4 chronic kidney disease, or unspecified chronic kidney disease: Secondary | ICD-10-CM | POA: Diagnosis not present

## 2018-07-02 DIAGNOSIS — J440 Chronic obstructive pulmonary disease with acute lower respiratory infection: Secondary | ICD-10-CM | POA: Diagnosis not present

## 2018-07-02 DIAGNOSIS — Z7951 Long term (current) use of inhaled steroids: Secondary | ICD-10-CM | POA: Diagnosis not present

## 2018-07-02 DIAGNOSIS — Z9181 History of falling: Secondary | ICD-10-CM | POA: Diagnosis not present

## 2018-07-02 DIAGNOSIS — Z792 Long term (current) use of antibiotics: Secondary | ICD-10-CM | POA: Diagnosis not present

## 2018-07-03 DIAGNOSIS — N183 Chronic kidney disease, stage 3 (moderate): Secondary | ICD-10-CM | POA: Diagnosis not present

## 2018-07-03 DIAGNOSIS — J440 Chronic obstructive pulmonary disease with acute lower respiratory infection: Secondary | ICD-10-CM | POA: Diagnosis not present

## 2018-07-03 DIAGNOSIS — J189 Pneumonia, unspecified organism: Secondary | ICD-10-CM | POA: Diagnosis not present

## 2018-07-03 DIAGNOSIS — Z9181 History of falling: Secondary | ICD-10-CM | POA: Diagnosis not present

## 2018-07-03 DIAGNOSIS — C3490 Malignant neoplasm of unspecified part of unspecified bronchus or lung: Secondary | ICD-10-CM | POA: Diagnosis not present

## 2018-07-03 DIAGNOSIS — I129 Hypertensive chronic kidney disease with stage 1 through stage 4 chronic kidney disease, or unspecified chronic kidney disease: Secondary | ICD-10-CM | POA: Diagnosis not present

## 2018-07-04 ENCOUNTER — Telehealth: Payer: Self-pay

## 2018-07-04 DIAGNOSIS — I129 Hypertensive chronic kidney disease with stage 1 through stage 4 chronic kidney disease, or unspecified chronic kidney disease: Secondary | ICD-10-CM | POA: Diagnosis not present

## 2018-07-04 DIAGNOSIS — Z9181 History of falling: Secondary | ICD-10-CM | POA: Diagnosis not present

## 2018-07-04 DIAGNOSIS — N183 Chronic kidney disease, stage 3 (moderate): Secondary | ICD-10-CM | POA: Diagnosis not present

## 2018-07-04 DIAGNOSIS — C3490 Malignant neoplasm of unspecified part of unspecified bronchus or lung: Secondary | ICD-10-CM | POA: Diagnosis not present

## 2018-07-04 DIAGNOSIS — J189 Pneumonia, unspecified organism: Secondary | ICD-10-CM | POA: Diagnosis not present

## 2018-07-04 DIAGNOSIS — J440 Chronic obstructive pulmonary disease with acute lower respiratory infection: Secondary | ICD-10-CM | POA: Diagnosis not present

## 2018-07-04 LAB — CULTURE, BLOOD (ROUTINE X 2)
Special Requests: ADEQUATE
Special Requests: ADEQUATE

## 2018-07-04 NOTE — Telephone Encounter (Signed)
EMMI Follow-up: Noted on the report that the patient had a question about transportation follow-up.  When I talked with Brenda Parker she said she would have transportation to her upcoming appointments at the cancer center so no needs.  I let her know there would be another automated call with a different series of questions and to let us know at that time if she had any concerns.

## 2018-07-05 DIAGNOSIS — Z9181 History of falling: Secondary | ICD-10-CM | POA: Diagnosis not present

## 2018-07-05 DIAGNOSIS — N183 Chronic kidney disease, stage 3 (moderate): Secondary | ICD-10-CM | POA: Diagnosis not present

## 2018-07-05 DIAGNOSIS — I129 Hypertensive chronic kidney disease with stage 1 through stage 4 chronic kidney disease, or unspecified chronic kidney disease: Secondary | ICD-10-CM | POA: Diagnosis not present

## 2018-07-05 DIAGNOSIS — J440 Chronic obstructive pulmonary disease with acute lower respiratory infection: Secondary | ICD-10-CM | POA: Diagnosis not present

## 2018-07-05 DIAGNOSIS — C3490 Malignant neoplasm of unspecified part of unspecified bronchus or lung: Secondary | ICD-10-CM | POA: Diagnosis not present

## 2018-07-05 DIAGNOSIS — J189 Pneumonia, unspecified organism: Secondary | ICD-10-CM | POA: Diagnosis not present

## 2018-07-06 ENCOUNTER — Inpatient Hospital Stay (HOSPITAL_BASED_OUTPATIENT_CLINIC_OR_DEPARTMENT_OTHER): Payer: Medicare Other | Admitting: Internal Medicine

## 2018-07-06 ENCOUNTER — Telehealth: Payer: Self-pay | Admitting: Internal Medicine

## 2018-07-06 ENCOUNTER — Encounter: Payer: Self-pay | Admitting: Internal Medicine

## 2018-07-06 ENCOUNTER — Inpatient Hospital Stay: Payer: Medicare Other

## 2018-07-06 ENCOUNTER — Other Ambulatory Visit: Payer: Self-pay

## 2018-07-06 ENCOUNTER — Ambulatory Visit
Admission: RE | Admit: 2018-07-06 | Discharge: 2018-07-06 | Disposition: A | Discharge status: Home / Self Care | Payer: Medicare Other | Source: Ambulatory Visit | Attending: Internal Medicine | Admitting: Internal Medicine

## 2018-07-06 VITALS — BP 116/68 | HR 98 | Temp 97.6°F | Resp 18 | Ht 64.0 in | Wt 143.4 lb

## 2018-07-06 DIAGNOSIS — C3411 Malignant neoplasm of upper lobe, right bronchus or lung: Secondary | ICD-10-CM

## 2018-07-06 DIAGNOSIS — Z853 Personal history of malignant neoplasm of breast: Secondary | ICD-10-CM

## 2018-07-06 DIAGNOSIS — K5900 Constipation, unspecified: Secondary | ICD-10-CM

## 2018-07-06 DIAGNOSIS — Z8701 Personal history of pneumonia (recurrent): Secondary | ICD-10-CM | POA: Diagnosis not present

## 2018-07-06 DIAGNOSIS — Z923 Personal history of irradiation: Secondary | ICD-10-CM | POA: Diagnosis not present

## 2018-07-06 DIAGNOSIS — Z79899 Other long term (current) drug therapy: Secondary | ICD-10-CM | POA: Diagnosis not present

## 2018-07-06 DIAGNOSIS — M7989 Other specified soft tissue disorders: Secondary | ICD-10-CM

## 2018-07-06 DIAGNOSIS — Z87891 Personal history of nicotine dependence: Secondary | ICD-10-CM | POA: Diagnosis not present

## 2018-07-06 DIAGNOSIS — Z9221 Personal history of antineoplastic chemotherapy: Secondary | ICD-10-CM

## 2018-07-06 DIAGNOSIS — J440 Chronic obstructive pulmonary disease with acute lower respiratory infection: Secondary | ICD-10-CM

## 2018-07-06 LAB — CBC WITH DIFFERENTIAL/PLATELET
BASOS ABS: 0 10*3/uL (ref 0–0.1)
Basophils Relative: 0 %
Eosinophils Absolute: 0 10*3/uL (ref 0–0.7)
Eosinophils Relative: 0 %
HCT: 24.1 % — ABNORMAL LOW (ref 35.0–47.0)
Hemoglobin: 7.9 g/dL — ABNORMAL LOW (ref 12.0–16.0)
LYMPHS PCT: 3 %
Lymphs Abs: 0.4 10*3/uL — ABNORMAL LOW (ref 1.0–3.6)
MCH: 29.7 pg (ref 26.0–34.0)
MCHC: 32.9 g/dL (ref 32.0–36.0)
MCV: 90.3 fL (ref 80.0–100.0)
Monocytes Absolute: 0.8 10*3/uL (ref 0.2–0.9)
Monocytes Relative: 6 %
NEUTROS ABS: 13.9 10*3/uL — AB (ref 1.4–6.5)
Neutrophils Relative %: 91 %
PLATELETS: 338 10*3/uL (ref 150–440)
RBC: 2.67 MIL/uL — AB (ref 3.80–5.20)
RDW: 18.4 % — ABNORMAL HIGH (ref 11.5–14.5)
WBC: 15.3 10*3/uL — AB (ref 3.6–11.0)

## 2018-07-06 LAB — COMPREHENSIVE METABOLIC PANEL
ALT: 56 U/L — ABNORMAL HIGH (ref 0–44)
ANION GAP: 9 (ref 5–15)
AST: 55 U/L — ABNORMAL HIGH (ref 15–41)
Albumin: 2.3 g/dL — ABNORMAL LOW (ref 3.5–5.0)
Alkaline Phosphatase: 126 U/L (ref 38–126)
BILIRUBIN TOTAL: 0.9 mg/dL (ref 0.3–1.2)
BUN: 16 mg/dL (ref 8–23)
CO2: 25 mmol/L (ref 22–32)
Calcium: 8.7 mg/dL — ABNORMAL LOW (ref 8.9–10.3)
Chloride: 101 mmol/L (ref 98–111)
Creatinine, Ser: 1.1 mg/dL — ABNORMAL HIGH (ref 0.44–1.00)
GFR, EST AFRICAN AMERICAN: 53 mL/min — AB (ref >60)
GFR, EST NON AFRICAN AMERICAN: 46 mL/min — AB (ref >60)
Glucose, Bld: 117 mg/dL — ABNORMAL HIGH (ref 70–99)
POTASSIUM: 3.8 mmol/L (ref 3.5–5.1)
Sodium: 135 mmol/L (ref 135–145)
TOTAL PROTEIN: 6.5 g/dL (ref 6.5–8.1)

## 2018-07-06 MED ORDER — LEVOFLOXACIN 250 MG PO TABS: ORAL_TABLET | ORAL | 0 refills | Status: DC

## 2018-07-06 NOTE — Telephone Encounter (Signed)
Ct - scan has been scheduled.

## 2018-07-06 NOTE — Progress Notes (Signed)
[place] OFFICE PROGRESS NOTE  Patient Care Team: Leonel Ramsay, MD as PCP - General (Infectious Diseases) Telford Nab, RN as Registered Nurse [name], MD as Medical Oncologist (Medical Oncology)  Cancer Staging No matching staging information was found for the patient.   Oncology History   # RUL LUNG CANCER-non-small cell; favor squamous cell; PET scan T4N0 [right upper lobe mass involving[mediastinal/blood results] vs small pleural effusion [?M1]   # Hx of Right breast ca [s/p lumpec; RT; No chemo; "pill" x5 years]  # COPD/Hx of smoking  MOLECULAR TESTING/OMNISEQ: PDL-1 25% [TPS; TMB-H; No targets**]  --------------------------------------------------    DIAGNOSIS: [ April 2019]SQUAMOUS CELL LUNG CA  STAGE: III ;GOALS: CURATIVE  CURRENT/MOST RECENT THERAPY [ May 2019]- Carbo-taxol with RT .       Primary cancer of right upper lobe of lung ([place])   03/06/2018 -  Chemotherapy    The patient had palonosetron (ALOXI) injection 0.25 mg, 0.25 mg, Intravenous,  Once, 9 of 9 cycles Administration: 0.25 mg (03/13/2018), 0.25 mg (04/10/2018), 0.25 mg (04/17/2018), 0.25 mg (03/21/2018), 0.25 mg (04/25/2018), 0.25 mg (03/27/2018), 0.25 mg (04/03/2018), 0.25 mg (05/01/2018), 0.25 mg (05/08/2018) CARBOplatin (PARAPLATIN) 130 mg in sodium chloride 0.9 % 250 mL chemo infusion, 130 mg (100 % of original dose 131.4 mg), Intravenous,  Once, 9 of 9 cycles Dose modification:   (original dose 131.4 mg, Cycle 1) Administration: 130 mg (03/13/2018), 130 mg (04/10/2018), 130 mg (04/17/2018), 130 mg (03/21/2018), 130 mg (04/25/2018), 130 mg (03/27/2018), 130 mg (04/03/2018), 130 mg (05/01/2018), 130 mg (05/08/2018) PACLitaxel (TAXOL) 78 mg in sodium chloride 0.9 % 250 mL chemo infusion (</= 57m/m2), 45 mg/m2 = 78 mg, Intravenous,  Once, 9 of 9 cycles Administration: 78 mg (03/13/2018), 78 mg (04/10/2018), 78 mg (04/17/2018), 78 mg (03/21/2018), 78 mg (04/25/2018), 78 mg (03/27/2018), 78 mg  (04/03/2018), 78 mg (05/01/2018), 78 mg (05/08/2018)  for chemotherapy treatment.        INTERVAL HISTORY: Patient is a vague historian/patient's son along with the patient  Brenda [email or web address].  female pleasant patient above history of stage III lung cancer is here for follow-up after recent admission to the hospital for pneumonia; right cavitary lesion.  Patient finished Levaquin for total of 5 days.  She continues to feel weak poor appetite.  Complains of swelling in the legs.  Positive for constipation.  Shortness of breath with exertion.  Mild cough no hemoptysis.  Had episode of fall again.   Review of Systems  Constitutional: Positive for malaise/fatigue and weight loss. Negative for chills, diaphoresis and fever.  HENT: Negative for nosebleeds and sore throat.   Eyes: Negative for double vision.  Respiratory: Positive for cough and shortness of breath. Negative for hemoptysis, sputum production and wheezing.   Cardiovascular: Positive for leg swelling. Negative for chest pain, palpitations and orthopnea.  Gastrointestinal: Positive for constipation. Negative for abdominal pain, blood in stool, heartburn, melena, nausea and vomiting.  Genitourinary: Negative for dysuria, frequency and urgency.  Musculoskeletal: Negative for back pain and joint pain.  Skin: Negative.  Negative for itching and rash.  Neurological: Negative for tingling, focal weakness, weakness and headaches.       Positive for fall.  Endo/Heme/Allergies: Does not bruise/bleed easily.  Psychiatric/Behavioral: Negative for depression. The patient is not nervous/anxious and does not have insomnia.       PAST MEDICAL HISTORY :  Past Medical History:  Diagnosis Date  . Benign liver cyst   . Breast cancer (  [place]) 2005   right breast  . COPD (chronic obstructive pulmonary disease) ([place])   . Dyspnea   . Glaucoma   . Hemoptysis 2019  . Hypertension    Not on medication for htn. patient denies this  . Lung mass  01/2018  . Personal history of radiation therapy     PAST SURGICAL HISTORY :   Past Surgical History:  Procedure Laterality Date  . BREAST EXCISIONAL BIOPSY Right 2005   positive, radiation  . CHOLECYSTECTOMY  1988  . COLONOSCOPY  2012  . ENDOBRONCHIAL ULTRASOUND N/A 02/21/2018   Procedure: ENDOBRONCHIAL ULTRASOUND;  Surgeon: Laverle Hobby, MD;  Location: [facility] ORS;  Service: Pulmonary;  Laterality: N/A;  . EYE SURGERY Bilateral 2009,2010   cataract extractions with lens implant  . GALLBLADDER SURGERY Bilateral   . IR FLUORO GUIDE PORT INSERTION RIGHT  03/10/2018  . JOINT REPLACEMENT Left 2008   left hip replacement  . PORTACATH PLACEMENT  03/10/2018  . TONSILLECTOMY  1960    FAMILY HISTORY :   Family History  Problem Relation Age of Onset  . Breast cancer Mother 19  . CAD Father     SOCIAL HISTORY:   Social History   Tobacco Use  . Smoking status: Former Smoker    Packs/day: 1.00    Types: Cigarettes    Last attempt to quit: 10/25/2005    Years since quitting: 12.7  . Smokeless tobacco: Never Used  Substance Use Topics  . Alcohol use: Not Currently    Comment: occasional glass of beer  . Drug use: Never    ALLERGIES:  is allergic to penicillins.  MEDICATIONS:  Current Outpatient Medications  Medication Sig Dispense Refill  . albuterol (PROVENTIL HFA) 108 (90 Base) MCG/ACT inhaler Inhale 2 puffs into the lungs every 6 (six) hours as needed for wheezing or shortness of breath.     . Cholecalciferol (VITAMIN D3) 1000 units CAPS Take 3,000 Units by mouth daily.     . Fluticasone-Salmeterol (ADVAIR DISKUS) 500-50 MCG/DOSE AEPB Inhale 1 puff into the lungs 2 (two) times daily. 3 each 0  . latanoprost (XALATAN) 0.005 % ophthalmic solution Place 1 drop into both eyes at bedtime.    . lidocaine-prilocaine (EMLA) cream Apply 1 application topically as needed. To port a cath site 30 g 6  . Multiple Vitamin (MULTIVITAMIN WITH MINERALS) TABS tablet Take 1 tablet by  mouth daily. 30 tablet 0  . oxyCODONE (OXY IR/ROXICODONE) 5 MG immediate release tablet Take 1 tablet (5 mg total) by mouth 2 (two) times daily as needed for moderate pain or severe pain. 15 tablet 0  . polyethylene glycol (MIRALAX / GLYCOLAX) packet Take 17 g by mouth daily as needed for mild constipation or moderate constipation.    Marland Kitchen levofloxacin (LEVAQUIN) 250 MG tablet Take 2 pills today; and then one a day 16 tablet 0   No current facility-administered medications for this visit.     PHYSICAL EXAMINATION: ECOG PERFORMANCE STATUS: 1 - Symptomatic but completely ambulatory  BP 116/68   Pulse 98   Temp 97.6 F (36.4 C) (Tympanic)   Resp 18   Ht 5' 4"  (1.626 m)   Wt 143 lb 6.4 oz (65 kg)   SpO2 94%   BMI 24.61 kg/m   Filed Weights   07/06/18 0827  Weight: 143 lb 6.4 oz (65 kg)    Physical Exam  Constitutional: She is oriented to person, place, and time.  She feels weak.  She is in a wheelchair.  Accompanied by son.  HENT:  Head: Normocephalic and atraumatic.  Mouth/Throat: Oropharynx is clear and moist. No oropharyngeal exudate.  Eyes: Pupils are equal, round, and reactive to light.  Neck: Normal range of motion. Neck supple.  Cardiovascular: Normal rate and regular rhythm.  Pulmonary/Chest: No respiratory distress. She has no wheezes.  Abdominal: Soft. Bowel sounds are normal. She exhibits no distension and no mass. There is no tenderness. There is no rebound and no guarding.  Musculoskeletal: Normal range of motion. She exhibits no edema or tenderness.  Neurological: She is alert and oriented to person, place, and time.  Skin: Skin is warm.  Psychiatric: Affect normal.       LABORATORY DATA:  I have reviewed the data as listed    Component Value Date/Time   NA 135 07/06/2018 0941   K 3.8 07/06/2018 0941   CL 101 07/06/2018 0941   CO2 25 07/06/2018 0941   GLUCOSE 117 (H) 07/06/2018 0941   BUN 16 07/06/2018 0941   CREATININE 1.10 (H) 07/06/2018 0941    CALCIUM 8.7 (L) 07/06/2018 0941   PROT 6.5 07/06/2018 0941   ALBUMIN 2.3 (L) 07/06/2018 0941   AST 55 (H) 07/06/2018 0941   ALT 56 (H) 07/06/2018 0941   ALKPHOS 126 07/06/2018 0941   BILITOT 0.9 07/06/2018 0941   GFRNONAA 46 (L) 07/06/2018 0941   GFRAA 53 (L) 07/06/2018 0941    No results found for: SPEP, UPEP  Lab Results  Component Value Date   WBC 15.3 (H) 07/06/2018   NEUTROABS 13.9 (H) 07/06/2018   HGB 7.9 (L) 07/06/2018   HCT 24.1 (L) 07/06/2018   MCV 90.3 07/06/2018   PLT 338 07/06/2018      Chemistry      Component Value Date/Time   NA 135 07/06/2018 0941   K 3.8 07/06/2018 0941   CL 101 07/06/2018 0941   CO2 25 07/06/2018 0941   BUN 16 07/06/2018 0941   CREATININE 1.10 (H) 07/06/2018 0941      Component Value Date/Time   CALCIUM 8.7 (L) 07/06/2018 0941   ALKPHOS 126 07/06/2018 0941   AST 55 (H) 07/06/2018 0941   ALT 56 (H) 07/06/2018 0941   BILITOT 0.9 07/06/2018 0941       RADIOGRAPHIC STUDIES: I have personally reviewed the radiological images as listed and agreed with the findings in the report. Dg Chest 2 View  Result Date: 07/06/2018 CLINICAL DATA:  Pt states she was diagnosed with right upper lobe lung cancer in April, this is a follow up exam. Pt is also SOB, checking for pneumonia. Also has a history of right breast cancer 2005, HTN. EXAM: CHEST - 2 VIEW COMPARISON:  Chest radiographs, 06/28/2018.  Chest CT, 06/23/2018. FINDINGS: There is a cavitary mass in the right lower lobe that has significantly increased in size from the most recent prior exam. It now measures approximately 7.4 x 6.2 x 6.5 cm. Dependent fluid lies within the cavity. There is hazy ground-glass opacity in the adjacent right lower lobe most evident centrally. Linear scarring extending from the superior right hilum with associated right upper lobe volume loss is stable from the prior exam. Left lung is hyperexpanded but clear. Cardiac silhouette is normal in size. No mediastinal or  hilar masses. No pleural effusion or pneumothorax. Stable left anterior chest wall Port-A-Cath. Skeletal structures are demineralized.  No convincing bone lesion. IMPRESSION: 1. Cavitary right lower lobe mass has significantly increased in size from the prior study, now measuring 7.4 cm. It  contains dependent fluid. There is also adjacent ground-glass type opacity in the right lower lobe, which may reflect inflammation or infection. Recommend follow-up chest CT with contrast for further assessment. 2. No other change. Electronically Signed   By: Lajean Manes M.D.   On: 07/06/2018 10:31     ASSESSMENT & PLAN:  Primary cancer of right upper lobe of lung ([place]) #Right upper lobe lung cancer-squamous cell-stage III/ unresectable; on carboplatin Taxol with radiation; currently status post chemo-radiation July 18.   # AUG 30th  2019-CT scan improved right upper lobe malignancy; however bilateral groundglass opacities/new right lower lobe 17 mm nodule [see discussion below]  # HOLD consolidation durvalumab given the acute issues [see discussion below]  # Recent Pneumonia/ admission to hospital; x-ray on September 4 showed 3.9 cm cavitary lesion-suspect infection.  Recommend CXR/labs today; will discuss with Dr.Ram  # Bil LE swelling- leg elevation/ increase protein intake.   # follow up based on above work up.  Addendum: Chest x-ray shows increasing right lower lobe cavitary lesion; discussed with Dr. Juanell Fairly; pulmonary.  Recommend CT chest with contrast ASAP; recommend Levaquin.  Discussed with the patient's son.  He agrees with the plan.    Orders Placed This Encounter  Procedures  . DG Chest 2 View    Standing Status:   Future    Number of Occurrences:   1    Standing Expiration Date:   09/05/2019    Order Specific Question:   Reason for Exam (SYMPTOM  OR DIAGNOSIS REQUIRED)    Answer:   pneumonia; right lower lobe    Order Specific Question:   Preferred imaging location?    Answer:   [facility]  . CT CHEST W CONTRAST    Standing Status:   Future    Standing Expiration Date:   07/07/2019    Order Specific Question:   If indicated for the ordered procedure, I authorize the administration of contrast media per Radiology protocol    Answer:   Yes    Order Specific Question:   Preferred imaging location?    Answer:   [facility]    Order Specific Question:   Radiology Contrast Protocol - do NOT remove file path    Answer:   [email or web address]    Order Specific Question:   ** REASON FOR EXAM (FREE TEXT)    Answer:   lung cancer; right lower cavitating mass.  Marland Kitchen CBC with Differential/Platelet    Standing Status:   Future    Number of Occurrences:   1    Standing Expiration Date:   08/10/2019  . Comprehensive metabolic panel    Standing Status:   Future    Number of Occurrences:   1    Standing Expiration Date:   08/10/2019   All questions were answered. The patient knows to call the clinic with any problems, questions or concerns.      [name], MD 07/06/2018 2:44 PM

## 2018-07-06 NOTE — Assessment & Plan Note (Addendum)
#  Right upper lobe lung cancer-squamous cell-stage III/ unresectable; on carboplatin Taxol with radiation; currently status post chemo-radiation July 18.   # AUG 30th  2019-CT scan improved right upper lobe malignancy; however bilateral groundglass opacities/new right lower lobe 17 mm nodule [see discussion below]  # HOLD consolidation durvalumab given the acute issues [see discussion below]  # Recent Pneumonia/ admission to hospital; x-ray on September 4 showed 3.9 cm cavitary lesion-suspect infection.  Recommend CXR/labs today; will discuss with Dr.Ram  # Bil LE swelling- leg elevation/ increase protein intake.   # follow up based on above work up.  Addendum: Chest x-ray shows increasing right lower lobe cavitary lesion; discussed with Dr. Juanell Fairly; pulmonary.  Recommend CT chest with contrast ASAP; recommend Levaquin.  Discussed with the patient's son.  He agrees with the plan.

## 2018-07-06 NOTE — Progress Notes (Signed)
Patient presented to clinic today for post hospitalization follow-up. Patient c/o constipation. Patient's last BM was yesterday. Used miralax/prunes to help with constipation.

## 2018-07-06 NOTE — Telephone Encounter (Signed)
Spoke to patient's son regarding the results of the blood work chest x-ray.  Recommend stat CT chest.  This has been ordered.  Please contact patient's son Brenda Parker/phone number.  All

## 2018-07-07 ENCOUNTER — Encounter: Payer: Self-pay | Admitting: Internal Medicine

## 2018-07-07 ENCOUNTER — Inpatient Hospital Stay (HOSPITAL_BASED_OUTPATIENT_CLINIC_OR_DEPARTMENT_OTHER): Payer: Medicare Other | Admitting: Internal Medicine

## 2018-07-07 ENCOUNTER — Other Ambulatory Visit: Payer: Self-pay

## 2018-07-07 ENCOUNTER — Inpatient Hospital Stay: Payer: Medicare Other

## 2018-07-07 ENCOUNTER — Ambulatory Visit
Admission: RE | Admit: 2018-07-07 | Discharge: 2018-07-07 | Disposition: A | Discharge status: Home / Self Care | Payer: Medicare Other | Source: Ambulatory Visit | Attending: Internal Medicine | Admitting: Internal Medicine

## 2018-07-07 ENCOUNTER — Inpatient Hospital Stay
Admission: AD | Admit: 2018-07-07 | Discharge: 2018-07-11 | DRG: 182 | Disposition: A | Discharge status: Home / Self Care | Payer: Medicare Other | Source: Ambulatory Visit | Attending: Internal Medicine | Admitting: Internal Medicine

## 2018-07-07 VITALS — BP 121/71 | HR 90 | Temp 97.6°F | Resp 20 | Ht 64.0 in | Wt 143.0 lb

## 2018-07-07 DIAGNOSIS — J440 Chronic obstructive pulmonary disease with acute lower respiratory infection: Secondary | ICD-10-CM

## 2018-07-07 DIAGNOSIS — Z87891 Personal history of nicotine dependence: Secondary | ICD-10-CM | POA: Diagnosis not present

## 2018-07-07 DIAGNOSIS — N183 Chronic kidney disease, stage 3 (moderate): Secondary | ICD-10-CM | POA: Diagnosis present

## 2018-07-07 DIAGNOSIS — K5900 Constipation, unspecified: Secondary | ICD-10-CM | POA: Diagnosis not present

## 2018-07-07 DIAGNOSIS — Z9049 Acquired absence of other specified parts of digestive tract: Secondary | ICD-10-CM

## 2018-07-07 DIAGNOSIS — Z9221 Personal history of antineoplastic chemotherapy: Secondary | ICD-10-CM

## 2018-07-07 DIAGNOSIS — Z79899 Other long term (current) drug therapy: Secondary | ICD-10-CM

## 2018-07-07 DIAGNOSIS — C3411 Malignant neoplasm of upper lobe, right bronchus or lung: Secondary | ICD-10-CM

## 2018-07-07 DIAGNOSIS — Z8249 Family history of ischemic heart disease and other diseases of the circulatory system: Secondary | ICD-10-CM | POA: Diagnosis not present

## 2018-07-07 DIAGNOSIS — Z8701 Personal history of pneumonia (recurrent): Secondary | ICD-10-CM

## 2018-07-07 DIAGNOSIS — Z9181 History of falling: Secondary | ICD-10-CM | POA: Diagnosis not present

## 2018-07-07 DIAGNOSIS — D649 Anemia, unspecified: Secondary | ICD-10-CM

## 2018-07-07 DIAGNOSIS — I129 Hypertensive chronic kidney disease with stage 1 through stage 4 chronic kidney disease, or unspecified chronic kidney disease: Secondary | ICD-10-CM | POA: Diagnosis present

## 2018-07-07 DIAGNOSIS — Z803 Family history of malignant neoplasm of breast: Secondary | ICD-10-CM | POA: Diagnosis not present

## 2018-07-07 DIAGNOSIS — M7989 Other specified soft tissue disorders: Secondary | ICD-10-CM | POA: Diagnosis not present

## 2018-07-07 DIAGNOSIS — Z9841 Cataract extraction status, right eye: Secondary | ICD-10-CM

## 2018-07-07 DIAGNOSIS — Z880 Allergy status to penicillin: Secondary | ICD-10-CM | POA: Diagnosis not present

## 2018-07-07 DIAGNOSIS — Z961 Presence of intraocular lens: Secondary | ICD-10-CM | POA: Diagnosis present

## 2018-07-07 DIAGNOSIS — R918 Other nonspecific abnormal finding of lung field: Secondary | ICD-10-CM | POA: Diagnosis not present

## 2018-07-07 DIAGNOSIS — C3490 Malignant neoplasm of unspecified part of unspecified bronchus or lung: Secondary | ICD-10-CM | POA: Diagnosis not present

## 2018-07-07 DIAGNOSIS — E785 Hyperlipidemia, unspecified: Secondary | ICD-10-CM | POA: Diagnosis present

## 2018-07-07 DIAGNOSIS — Z923 Personal history of irradiation: Secondary | ICD-10-CM

## 2018-07-07 DIAGNOSIS — D638 Anemia in other chronic diseases classified elsewhere: Secondary | ICD-10-CM | POA: Diagnosis present

## 2018-07-07 DIAGNOSIS — Z95828 Presence of other vascular implants and grafts: Secondary | ICD-10-CM

## 2018-07-07 DIAGNOSIS — Z96642 Presence of left artificial hip joint: Secondary | ICD-10-CM | POA: Diagnosis present

## 2018-07-07 DIAGNOSIS — D631 Anemia in chronic kidney disease: Secondary | ICD-10-CM | POA: Diagnosis present

## 2018-07-07 DIAGNOSIS — Z66 Do not resuscitate: Secondary | ICD-10-CM | POA: Diagnosis not present

## 2018-07-07 DIAGNOSIS — C3431 Malignant neoplasm of lower lobe, right bronchus or lung: Secondary | ICD-10-CM | POA: Diagnosis not present

## 2018-07-07 DIAGNOSIS — Z853 Personal history of malignant neoplasm of breast: Secondary | ICD-10-CM | POA: Diagnosis not present

## 2018-07-07 DIAGNOSIS — Z9842 Cataract extraction status, left eye: Secondary | ICD-10-CM

## 2018-07-07 DIAGNOSIS — E440 Moderate protein-calorie malnutrition: Secondary | ICD-10-CM

## 2018-07-07 DIAGNOSIS — J189 Pneumonia, unspecified organism: Secondary | ICD-10-CM | POA: Diagnosis not present

## 2018-07-07 DIAGNOSIS — H409 Unspecified glaucoma: Secondary | ICD-10-CM | POA: Diagnosis present

## 2018-07-07 DIAGNOSIS — J984 Other disorders of lung: Secondary | ICD-10-CM | POA: Diagnosis not present

## 2018-07-07 DIAGNOSIS — J449 Chronic obstructive pulmonary disease, unspecified: Secondary | ICD-10-CM | POA: Diagnosis present

## 2018-07-07 LAB — CREATININE, SERUM
CREATININE: 1.08 mg/dL — AB (ref 0.44–1.00)
GFR calc Af Amer: 55 mL/min — ABNORMAL LOW (ref >60)
GFR calc non Af Amer: 47 mL/min — ABNORMAL LOW (ref >60)

## 2018-07-07 LAB — CBC
HCT: 27 % — ABNORMAL LOW (ref 35.0–47.0)
Hemoglobin: 9 g/dL — ABNORMAL LOW (ref 12.0–16.0)
MCH: 30.5 pg (ref 26.0–34.0)
MCHC: 33.4 g/dL (ref 32.0–36.0)
MCV: 91.2 fL (ref 80.0–100.0)
PLATELETS: 345 10*3/uL (ref 150–440)
RBC: 2.96 MIL/uL — ABNORMAL LOW (ref 3.80–5.20)
RDW: 18.7 % — ABNORMAL HIGH (ref 11.5–14.5)
WBC: 11.9 10*3/uL — ABNORMAL HIGH (ref 3.6–11.0)

## 2018-07-07 MED ORDER — SODIUM CHLORIDE 0.9 % IV SOLN
1.0000 g | Freq: Two times a day (BID) | INTRAVENOUS | Status: DC
  Administered 2018-07-07 – 2018-07-10 (×7): 1 g via INTRAVENOUS
  Filled 2018-07-07 (×9): qty 1

## 2018-07-07 MED ORDER — POLYETHYLENE GLYCOL 3350 17 G PO PACK: 17.0000 g | PACK | Freq: Every day | ORAL | Status: DC | PRN

## 2018-07-07 MED ORDER — ACETAMINOPHEN 325 MG PO TABS: 650.0000 mg | ORAL_TABLET | Freq: Four times a day (QID) | ORAL | Status: DC | PRN

## 2018-07-07 MED ORDER — ONDANSETRON HCL 4 MG PO TABS: 4.0000 mg | ORAL_TABLET | Freq: Four times a day (QID) | ORAL | Status: DC | PRN

## 2018-07-07 MED ORDER — OXYCODONE HCL 5 MG PO TABS
5.0000 mg | ORAL_TABLET | Freq: Two times a day (BID) | ORAL | Status: DC | PRN
  Administered 2018-07-09: 15:00:00 5 mg via ORAL
  Filled 2018-07-07: qty 1

## 2018-07-07 MED ORDER — MOMETASONE FURO-FORMOTEROL FUM 200-5 MCG/ACT IN AERO
2.0000 | INHALATION_SPRAY | Freq: Two times a day (BID) | RESPIRATORY_TRACT | Status: DC
  Administered 2018-07-07 – 2018-07-11 (×8): 2 via RESPIRATORY_TRACT
  Filled 2018-07-07: qty 8.8

## 2018-07-07 MED ORDER — SODIUM CHLORIDE 0.9% FLUSH
10.0000 mL | Freq: Once | INTRAVENOUS | Status: AC
  Administered 2018-07-07: 10 mL via INTRAVENOUS
  Filled 2018-07-07: qty 10

## 2018-07-07 MED ORDER — BISACODYL 5 MG PO TBEC: 5.0000 mg | DELAYED_RELEASE_TABLET | Freq: Every day | ORAL | Status: DC | PRN

## 2018-07-07 MED ORDER — ONDANSETRON HCL 4 MG/2ML IJ SOLN: 4.0000 mg | Freq: Four times a day (QID) | INTRAMUSCULAR | Status: DC | PRN

## 2018-07-07 MED ORDER — SENNOSIDES-DOCUSATE SODIUM 8.6-50 MG PO TABS: 1.0000 | ORAL_TABLET | Freq: Every evening | ORAL | Status: DC | PRN

## 2018-07-07 MED ORDER — SODIUM CHLORIDE 0.9 % IV SOLN
INTRAVENOUS | Status: DC
  Administered 2018-07-07: 75 mL/h via INTRAVENOUS
  Administered 2018-07-08 – 2018-07-10 (×4): via INTRAVENOUS

## 2018-07-07 MED ORDER — ENOXAPARIN SODIUM 40 MG/0.4ML ~~LOC~~ SOLN
40.0000 mg | SUBCUTANEOUS | Status: DC
  Administered 2018-07-07 – 2018-07-10 (×4): 40 mg via SUBCUTANEOUS
  Filled 2018-07-07 (×4): qty 0.4

## 2018-07-07 MED ORDER — ACETAMINOPHEN 650 MG RE SUPP: 650.0000 mg | Freq: Four times a day (QID) | RECTAL | Status: DC | PRN

## 2018-07-07 MED ORDER — LATANOPROST 0.005 % OP SOLN
1.0000 [drp] | Freq: Every day | OPHTHALMIC | Status: DC
  Administered 2018-07-07 – 2018-07-10 (×4): 1 [drp] via OPHTHALMIC
  Filled 2018-07-07: qty 2.5

## 2018-07-07 MED ORDER — ALBUTEROL SULFATE (2.5 MG/3ML) 0.083% IN NEBU: 2.5000 mg | INHALATION_SOLUTION | Freq: Four times a day (QID) | RESPIRATORY_TRACT | Status: DC | PRN

## 2018-07-07 MED ORDER — IOHEXOL 300 MG/ML  SOLN
75.0000 mL | Freq: Once | INTRAMUSCULAR | Status: AC | PRN
  Administered 2018-07-07: 75 mL via INTRAVENOUS

## 2018-07-07 MED ORDER — VITAMIN D 1000 UNITS PO TABS
3000.0000 [IU] | ORAL_TABLET | Freq: Every day | ORAL | Status: DC
  Administered 2018-07-07 – 2018-07-11 (×5): 3000 [IU] via ORAL
  Filled 2018-07-07 (×10): qty 3

## 2018-07-07 MED ORDER — ADULT MULTIVITAMIN W/MINERALS CH
1.0000 | ORAL_TABLET | Freq: Every day | ORAL | Status: DC
  Administered 2018-07-07 – 2018-07-11 (×5): 1 via ORAL
  Filled 2018-07-07 (×5): qty 1

## 2018-07-07 NOTE — Assessment & Plan Note (Addendum)
#  Right upper lobe lung cancer-squamous cell-stage III/ unresectable; on carboplatin Taxol with radiation; currently status post chemo-radiation July 18th.   CT scan chest done this morning shows-worsening right cavitary lesion/with fluid [question infection versus progressive malignancy]; right hilar progressive consolidation [question radiation changes versus progressive malignancy].  #Continue to HOLD consolidation durvalumab given the acute issues [see discussion below]  #Right lower lobe cavitary lesion-progressive getting worse [infection versus progressive malignancy].  Recommend IV antibiotics with anaerobic coverage.  Discussed with Dr. Juanell Fairly.  Patient may need a bronchoscopy.  Appreciate pulmonary evaluation.  #Right hilar consolidation-progressive again malignancy versus radiation changes.   I had a long discussion with the patient and her son regarding the concerning finding on the CT scan.  Discussed the obvious possibility of progressive malignancy-if patient does not improve post antibiotics; repeat imaging.    #Anemia-hemoglobin 7.8.  If continues to stay less than 8 recommend 1 unit of PRBC transfusion.  #Recommend palliative care evaluation in the hospital.  If patient's condition continues to decline hospice would be reasonable.  #Follow-up with me post discharge in hospital.  Discussed with Dr. Juanell Fairly.  Also discussed with Dr. Genia Harold who kindly agreed to admit the patient hospital.  Discussed with the patient and son in detail.  # I reviewed the blood work- with the patient in detail; also reviewed the imaging independently [as summarized above]; and with the patient in detail.   # 40 minutes face-to-face with the patient discussing the above plan of care; more than 50% of time spent on prognosis/ natural history; counseling and coordination.

## 2018-07-07 NOTE — H&P (Addendum)
[facility] NAME: Brenda Parker    MR#:  [id number]  DATE OF BIRTH:  1936-03-28  DATE OF ADMISSION:  07/07/2018  PRIMARY CARE PHYSICIAN: Leonel Ramsay, MD   REQUESTING/REFERRING PHYSICIAN: dr Caffie Pinto  CHIEF COMPLAINT:   Sent from oncology office for IV antibiotics HISTORY OF PRESENT ILLNESS:  Brenda Parker  is a 82 y.o. female with a known history of right upper lobe lung cancer, squamous cell stage III status post chemoradiation who was directly from oncology office admitted due to CT scan chest showing worsening right cavitary lesion with question of infection versus progressive malignancy. Patient is being hospitalized for IV antibiotics and bronchoscopy.  Patient has no fever, chills, nausea, vomiting, chest pain or shortness of breath PAST MEDICAL HISTORY:   Past Medical History:  Diagnosis Date  . Benign liver cyst   . Breast cancer ([place]) 2005   right breast  . COPD (chronic obstructive pulmonary disease) ([place])   . Dyspnea   . Glaucoma   . Hemoptysis 2019  . Hypertension    Not on medication for htn. patient denies this  . Lung mass 01/2018  . Personal history of radiation therapy     PAST SURGICAL HISTORY:   Past Surgical History:  Procedure Laterality Date  . BREAST EXCISIONAL BIOPSY Right 2005   positive, radiation  . CHOLECYSTECTOMY  1988  . COLONOSCOPY  2012  . ENDOBRONCHIAL ULTRASOUND N/A 02/21/2018   Procedure: ENDOBRONCHIAL ULTRASOUND;  Surgeon: Laverle Hobby, MD;  Location: [facility] ORS;  Service: Pulmonary;  Laterality: N/A;  . EYE SURGERY Bilateral 2009,2010   cataract extractions with lens implant  . GALLBLADDER SURGERY Bilateral   . IR FLUORO GUIDE PORT INSERTION RIGHT  03/10/2018  . JOINT REPLACEMENT Left 2008   left hip replacement  . PORTACATH PLACEMENT  03/10/2018  . TONSILLECTOMY  1960    SOCIAL HISTORY:   Social History   Tobacco Use  . Smoking status: Former Smoker     Packs/day: 1.00    Types: Cigarettes    Last attempt to quit: 10/25/2005    Years since quitting: 12.7  . Smokeless tobacco: Never Used  Substance Use Topics  . Alcohol use: Not Currently    Comment: occasional glass of beer    FAMILY HISTORY:   Family History  Problem Relation Age of Onset  . Breast cancer Mother 58  . CAD Father     DRUG ALLERGIES:   Allergies  Allergen Reactions  . Penicillins Rash    Patient had a rash after an injection in 1973.  Unsure if taken orally will cause any reaction  Has patient had a PCN reaction causing immediate rash, facial/tongue/throat swelling, SOB or lightheadedness with hypotension: Yes Has patient had a PCN reaction causing severe rash involving mucus membranes or skin necrosis: No Has patient had a PCN reaction that required hospitalization: No Has patient had a PCN reaction occurring within the last 10 years: No     REVIEW OF SYSTEMS:   Review of Systems  Constitutional: Negative.  Negative for chills, fever and malaise/fatigue.  HENT: Negative.  Negative for ear discharge, ear pain, hearing loss, nosebleeds and sore throat.   Eyes: Negative.  Negative for blurred vision and pain.  Respiratory: Negative.  Negative for cough, hemoptysis, shortness of breath and wheezing.   Cardiovascular: Negative.  Negative for chest pain, palpitations and leg swelling.  Gastrointestinal: Negative.  Negative for abdominal pain, blood in stool, diarrhea, nausea and  vomiting.  Genitourinary: Negative.  Negative for dysuria.  Musculoskeletal: Negative.  Negative for back pain.  Skin: Negative.   Neurological: Negative for dizziness, tremors, speech change, focal weakness, seizures and headaches.  Endo/Heme/Allergies: Negative.  Does not bruise/bleed easily.  Psychiatric/Behavioral: Negative.  Negative for depression, hallucinations and suicidal ideas.    MEDICATIONS AT HOME:   Prior to Admission medications   Medication Sig Start Date End Date  Taking? Authorizing Provider  albuterol (PROVENTIL HFA) 108 (90 Base) MCG/ACT inhaler Inhale 2 puffs into the lungs every 6 (six) hours as needed for wheezing or shortness of breath.  09/28/17 09/29/18  [provider]  Cholecalciferol (VITAMIN D3) 1000 units CAPS Take 3,000 Units by mouth daily.     [provider]  Fluticasone-Salmeterol (ADVAIR DISKUS) 500-50 MCG/DOSE AEPB Inhale 1 puff into the lungs 2 (two) times daily. 04/12/18   Jacquelin Hawking, NP  latanoprost (XALATAN) 0.005 % ophthalmic solution Place 1 drop into both eyes at bedtime. 12/25/17   [provider]  levofloxacin (LEVAQUIN) 250 MG tablet Take 2 pills today; and then one a day 07/06/18   [name], MD  lidocaine-prilocaine (EMLA) cream Apply 1 application topically as needed. To port a cath site 03/06/18   [name], MD  Multiple Vitamin (MULTIVITAMIN WITH MINERALS) TABS tablet Take 1 tablet by mouth daily. 06/30/18   Vaughan Basta, MD  oxyCODONE (OXY IR/ROXICODONE) 5 MG immediate release tablet Take 1 tablet (5 mg total) by mouth 2 (two) times daily as needed for moderate pain or severe pain. 06/30/18   Vaughan Basta, MD  polyethylene glycol (MIRALAX / GLYCOLAX) packet Take 17 g by mouth daily as needed for mild constipation or moderate constipation.    [provider]      VITAL SIGNS:  Blood pressure 136/69, pulse 85, temperature 98 F (36.7 C), temperature source Oral, SpO2 99 %.  PHYSICAL EXAMINATION:   Physical Exam  Constitutional: She is oriented to person, place, and time. No distress.  HENT:  Head: Normocephalic.  Eyes: No scleral icterus.  Neck: Normal range of motion. Neck supple. No JVD present. No tracheal deviation present.  Cardiovascular: Normal rate, regular rhythm and normal heart sounds. Exam reveals no gallop and no friction rub.  No murmur heard. Pulmonary/Chest: Effort normal and breath sounds normal. No respiratory distress.  She has no wheezes. She has no rales. She exhibits no tenderness.  Abdominal: Soft. Bowel sounds are normal. She exhibits no distension and no mass. There is no tenderness. There is no rebound and no guarding.  Musculoskeletal: Normal range of motion. She exhibits no edema.  Neurological: She is alert and oriented to person, place, and time.  Skin: Skin is warm. No rash noted. No erythema.  Psychiatric: Judgment normal.      LABORATORY PANEL:   CBC Recent Labs  Lab 07/06/18 0941  WBC 15.3*  HGB 7.9*  HCT 24.1*  PLT 338   ------------------------------------------------------------------------------------------------------------------  Chemistries  Recent Labs  Lab 07/06/18 0941  NA 135  K 3.8  CL 101  CO2 25  GLUCOSE 117*  BUN 16  CREATININE 1.10*  CALCIUM 8.7*  AST 55*  ALT 56*  ALKPHOS 126  BILITOT 0.9   ------------------------------------------------------------------------------------------------------------------  Cardiac Enzymes No results for input(s): TROPONINI in the last 168 hours. ------------------------------------------------------------------------------------------------------------------  RADIOLOGY:  Dg Chest 2 View  Result Date: 07/06/2018 CLINICAL DATA:  Pt states she was diagnosed with right upper lobe lung cancer in April, this is a follow up  exam. Pt is also SOB, checking for pneumonia. Also has a history of right breast cancer 2005, HTN. EXAM: CHEST - 2 VIEW COMPARISON:  Chest radiographs, 06/28/2018.  Chest CT, 06/23/2018. FINDINGS: There is a cavitary mass in the right lower lobe that has significantly increased in size from the most recent prior exam. It now measures approximately 7.4 x 6.2 x 6.5 cm. Dependent fluid lies within the cavity. There is hazy ground-glass opacity in the adjacent right lower lobe most evident centrally. Linear scarring extending from the superior right hilum with associated right upper lobe volume loss is stable  from the prior exam. Left lung is hyperexpanded but clear. Cardiac silhouette is normal in size. No mediastinal or hilar masses. No pleural effusion or pneumothorax. Stable left anterior chest wall Port-A-Cath. Skeletal structures are demineralized.  No convincing bone lesion. IMPRESSION: 1. Cavitary right lower lobe mass has significantly increased in size from the prior study, now measuring 7.4 cm. It contains dependent fluid. There is also adjacent ground-glass type opacity in the right lower lobe, which may reflect inflammation or infection. Recommend follow-up chest CT with contrast for further assessment. 2. No other change. Electronically Signed   By: Lajean Manes M.D.   On: 07/06/2018 10:31   Ct Chest W Contrast  Result Date: 07/07/2018 CLINICAL DATA:  Shortness of breath.  Lung cancer. EXAM: CT CHEST WITH CONTRAST TECHNIQUE: Multidetector CT imaging of the chest was performed during intravenous contrast administration. CONTRAST:  37mL OMNIPAQUE IOHEXOL 300 MG/ML  SOLN COMPARISON:  06/23/2018 FINDINGS: Cardiovascular: The heart size appears normal. Trace pericardial fluid appears similar to previous exam. Aortic atherosclerosis. Calcification in the LAD and left circumflex and RCA coronary arteries noted. Presumed tumor thrombus is identified within the mid right pulmonary artery, image 54/5.The right ventricular outflow tract is patent. The left pulmonary artery also appears patent Mediastinum/Nodes: No supraclavicular or axillary adenopathy. Left paratracheal adenopathy measures 1.1 cm, image 44/2. Similar to previous exam. Subcarinal lymph node measures 11 mm, image 60/2. Previously 8 mm. The right upper lobe perihilar mass invading the mediastinum is again noted. This measures 4.2 x 2.9 cm, image 58/2. Previously 2.9 x 2.2 cm. There is encasement and narrowing of the right mainstem bronchus, image 59/5. There is also narrowing of the distal SVC proximal to the cavoatrial junction by tumor. Tumor  thrombus is identified within and completely occluding the right main pulmonary artery. Lungs/Pleura: No pleural effusion identified. Moderate changes of emphysema. Large thick walled cavitary lung mass with air-fluid level measures 6.1 by 5.3 cm, image 88/3. previously this measured 1.7 by 1.4 cm. Fibrosis and scarring within the right upper lobe and right apex appears similar to previous exam. Within the superior segment of right lower lobe there is a area of ground-glass attenuation and interlobular thickening which measures 6.2 cm. Upper Abdomen: Unchanged cyst within right lobe of liver measuring 4.2 cm. Lateral segment of left lobe of liver is stable measuring 7.2 cm. Unremarkable appearance of the adrenal glands. Musculoskeletal: No chest wall abnormality. No acute or significant osseous findings. IMPRESSION: 1. Thick walled cavitary mass within the right lower lobe demonstrates significant interval increase in size compared with 06/23/2018. Assuming this represents an area of metastatic disease this is worrisome for progression of disease. Necrotizing pneumonia/pulmonary abscess could have a similar appearance. 2. Increase in size of right perihilar lung mass which invades the mediastinum. The right pulmonary artery is completely occluded and filled with tumor thrombus. There is encasement and narrowing of the right mainstem  bronchus. Mass effect and narrowing of the distal SVC is present. 3. Increase in size of enlarged subcarinal lymph node. 4. Regional area of ground-glass attenuation and interlobular septal thickening is noted within the superior segment of right lower lobe. Although nonspecific this may represent an area of pneumonitis. Lymphangitic spread of tumor may also have a similar appearance. 5. Aortic Atherosclerosis (ICD10-I70.0) and Emphysema (ICD10-J43.9). 6. Coronary artery atherosclerotic calcifications. Electronically Signed   By: Kerby Moors M.D.   On: 07/07/2018 13:27    EKG:   None  IMPRESSION AND PLAN:   82 year old female with right upper lobe squamous cell stage III lung cancer who is directly admitted due to worsening of right cavitary lesion seen on CT chest.  1.  Right cavitary lung lesion: Start IV Meropenem ( PCN ALLERGY) Pulmonary evaluation for bronchoscopy next week  2.  Right upper lobe squamous cell stage III lung cancer: Oncology evaluation  3.  Anemia of chronic disease: Follow hemoglobin   4.  COPD without signs of exacerbation  5.  Chronic kidney disease stage III creatinine is at baseline    All the records are reviewed and case discussed with ED provider. Management plans discussed with the patient and she is in agreement  CODE STATUS: DNR  TOTAL TIME TAKING CARE OF THIS PATIENT: 48 minutes.    Suleiman Finigan M.D on 07/07/2018 at 4:54 PM  Between 7am to 6pm - Pager - [phone]  After 6pm go to [email or web address] - password [email or web address] [facility]  Office  [phone]  CC: Primary care physician; Leonel Ramsay, MD

## 2018-07-07 NOTE — Progress Notes (Signed)
[place] OFFICE PROGRESS NOTE  Patient Care Team: Leonel Ramsay, MD as PCP - General (Infectious Diseases) Telford Nab, RN as Registered Nurse [name], MD as Medical Oncologist (Medical Oncology)  Cancer Staging No matching staging information was found for the patient.   Oncology History   # RUL LUNG CANCER-non-small cell; favor squamous cell; PET scan T4N0 [right upper lobe mass involving[mediastinal/blood results] vs small pleural effusion [?M1]   # Hx of Right breast ca [s/p lumpec; RT; No chemo; "pill" x5 years]  # COPD/Hx of smoking  MOLECULAR TESTING/OMNISEQ: PDL-1 25% [TPS; TMB-H; No targets**]  --------------------------------------------------    DIAGNOSIS: [ April 2019]SQUAMOUS CELL LUNG CA  STAGE: III ;GOALS: CURATIVE  CURRENT/MOST RECENT THERAPY [ May 2019]- Carbo-taxol with RT .       Primary cancer of right upper lobe of lung ([place])   03/06/2018 -  Chemotherapy    The patient had palonosetron (ALOXI) injection 0.25 mg, 0.25 mg, Intravenous,  Once, 9 of 9 cycles Administration: 0.25 mg (03/13/2018), 0.25 mg (04/10/2018), 0.25 mg (04/17/2018), 0.25 mg (03/21/2018), 0.25 mg (04/25/2018), 0.25 mg (03/27/2018), 0.25 mg (04/03/2018), 0.25 mg (05/01/2018), 0.25 mg (05/08/2018) CARBOplatin (PARAPLATIN) 130 mg in sodium chloride 0.9 % 250 mL chemo infusion, 130 mg (100 % of original dose 131.4 mg), Intravenous,  Once, 9 of 9 cycles Dose modification:   (original dose 131.4 mg, Cycle 1) Administration: 130 mg (03/13/2018), 130 mg (04/10/2018), 130 mg (04/17/2018), 130 mg (03/21/2018), 130 mg (04/25/2018), 130 mg (03/27/2018), 130 mg (04/03/2018), 130 mg (05/01/2018), 130 mg (05/08/2018) PACLitaxel (TAXOL) 78 mg in sodium chloride 0.9 % 250 mL chemo infusion (</= 43m/m2), 45 mg/m2 = 78 mg, Intravenous,  Once, 9 of 9 cycles Administration: 78 mg (03/13/2018), 78 mg (04/10/2018), 78 mg (04/17/2018), 78 mg (03/21/2018), 78 mg (04/25/2018), 78 mg (03/27/2018), 78 mg  (04/03/2018), 78 mg (05/01/2018), 78 mg (05/08/2018)  for chemotherapy treatment.        INTERVAL HISTORY: Patient is a vague historian/patient's son along with the patient  Brenda WILLISON89y.o.  female pleasant patient above history of stage III lung cancer status post chemoradiation-is here for follow-up to review the results of the CT scan.  Patient was recently to the hospital for right lower lobe pneumonia/cavitary lesion status post IV antibiotics.  However patient on a follow-up chest x-ray found to have progressive right lower lobe lesion-for which patient had a CT scan this morning.  Patient continues to feel poorly.  Poor appetite.  No fevers.  No nausea vomiting.  Feels weak all over.  Mild shortness of breath with exertion.  Positive for cough.  No hemoptysis.  Continues to have constipation.  Mild leg swelling.  Review of Systems  Constitutional: Positive for malaise/fatigue and weight loss. Negative for chills, diaphoresis and fever.  HENT: Negative for nosebleeds and sore throat.   Eyes: Negative for double vision.  Respiratory: Positive for cough and shortness of breath. Negative for hemoptysis, sputum production and wheezing.   Cardiovascular: Positive for leg swelling. Negative for chest pain, palpitations and orthopnea.  Gastrointestinal: Positive for constipation. Negative for abdominal pain, blood in stool, heartburn, melena, nausea and vomiting.  Genitourinary: Negative for dysuria, frequency and urgency.  Musculoskeletal: Negative for back pain and joint pain.  Skin: Negative.  Negative for itching and rash.  Neurological: Negative for tingling, focal weakness, weakness and headaches.       Positive for fall.  Endo/Heme/Allergies: Does not bruise/bleed easily.  Psychiatric/Behavioral: Negative for  depression. The patient is not nervous/anxious and does not have insomnia.       PAST MEDICAL HISTORY :  Past Medical History:  Diagnosis Date  . Benign liver cyst   .  Breast cancer ([place]) 2005   right breast  . COPD (chronic obstructive pulmonary disease) ([place])   . Dyspnea   . Glaucoma   . Hemoptysis 2019  . Hypertension    Not on medication for htn. patient denies this  . Lung mass 01/2018  . Personal history of radiation therapy     PAST SURGICAL HISTORY :   Past Surgical History:  Procedure Laterality Date  . BREAST EXCISIONAL BIOPSY Right 2005   positive, radiation  . CHOLECYSTECTOMY  1988  . COLONOSCOPY  2012  . ENDOBRONCHIAL ULTRASOUND N/A 02/21/2018   Procedure: ENDOBRONCHIAL ULTRASOUND;  Surgeon: Laverle Hobby, MD;  Location: [facility] ORS;  Service: Pulmonary;  Laterality: N/A;  . EYE SURGERY Bilateral 2009,2010   cataract extractions with lens implant  . GALLBLADDER SURGERY Bilateral   . IR FLUORO GUIDE PORT INSERTION RIGHT  03/10/2018  . JOINT REPLACEMENT Left 2008   left hip replacement  . PORTACATH PLACEMENT  03/10/2018  . TONSILLECTOMY  1960    FAMILY HISTORY :   Family History  Problem Relation Age of Onset  . Breast cancer Mother 75  . CAD Father     SOCIAL HISTORY:   Social History   Tobacco Use  . Smoking status: Former Smoker    Packs/day: 1.00    Types: Cigarettes    Last attempt to quit: 10/25/2005    Years since quitting: 12.7  . Smokeless tobacco: Never Used  Substance Use Topics  . Alcohol use: Not Currently    Comment: occasional glass of beer  . Drug use: Never    ALLERGIES:  is allergic to penicillins.  MEDICATIONS:  Current Outpatient Medications  Medication Sig Dispense Refill  . albuterol (PROVENTIL HFA) 108 (90 Base) MCG/ACT inhaler Inhale 2 puffs into the lungs every 6 (six) hours as needed for wheezing or shortness of breath.     . Cholecalciferol (VITAMIN D3) 1000 units CAPS Take 3,000 Units by mouth daily.     . Fluticasone-Salmeterol (ADVAIR DISKUS) 500-50 MCG/DOSE AEPB Inhale 1 puff into the lungs 2 (two) times daily. 3 each 0  . latanoprost (XALATAN) 0.005 % ophthalmic solution Place  1 drop into both eyes at bedtime.    Marland Kitchen levofloxacin (LEVAQUIN) 250 MG tablet Take 2 pills today; and then one a day 16 tablet 0  . lidocaine-prilocaine (EMLA) cream Apply 1 application topically as needed. To port a cath site 30 g 6  . Multiple Vitamin (MULTIVITAMIN WITH MINERALS) TABS tablet Take 1 tablet by mouth daily. 30 tablet 0  . oxyCODONE (OXY IR/ROXICODONE) 5 MG immediate release tablet Take 1 tablet (5 mg total) by mouth 2 (two) times daily as needed for moderate pain or severe pain. 15 tablet 0  . polyethylene glycol (MIRALAX / GLYCOLAX) packet Take 17 g by mouth daily as needed for mild constipation or moderate constipation.     No current facility-administered medications for this visit.     PHYSICAL EXAMINATION: ECOG PERFORMANCE STATUS: 1 - Symptomatic but completely ambulatory  BP 121/71   Pulse 90   Temp 97.6 F (36.4 C) (Tympanic)   Resp 20   Ht 5' 4"  (1.626 m)   Wt 143 lb (64.9 kg)   BMI 24.55 kg/m   Filed Weights   07/07/18 1404  Weight:  143 lb (64.9 kg)    Physical Exam  Constitutional: She is oriented to person, place, and time.  Frail-appearing Caucasian female patient..  She is in a wheelchair.  Accompanied by son.  HENT:  Head: Normocephalic and atraumatic.  Mouth/Throat: Oropharynx is clear and moist. No oropharyngeal exudate.  Eyes: Pupils are equal, round, and reactive to light.  Neck: Normal range of motion. Neck supple.  Cardiovascular: Normal rate and regular rhythm.  Pulmonary/Chest: No respiratory distress. She has no wheezes.  Decreased breath sounds in the right compared to the left.  Abdominal: Soft. Bowel sounds are normal. She exhibits no distension and no mass. There is no tenderness. There is no rebound and no guarding.  Musculoskeletal: Normal range of motion. She exhibits no edema or tenderness.  Neurological: She is alert and oriented to person, place, and time.  Skin: Skin is warm.  Psychiatric: Affect normal.        LABORATORY DATA:  I have reviewed the data as listed    Component Value Date/Time   NA 135 07/06/2018 0941   K 3.8 07/06/2018 0941   CL 101 07/06/2018 0941   CO2 25 07/06/2018 0941   GLUCOSE 117 (H) 07/06/2018 0941   BUN 16 07/06/2018 0941   CREATININE 1.10 (H) 07/06/2018 0941   CALCIUM 8.7 (L) 07/06/2018 0941   PROT 6.5 07/06/2018 0941   ALBUMIN 2.3 (L) 07/06/2018 0941   AST 55 (H) 07/06/2018 0941   ALT 56 (H) 07/06/2018 0941   ALKPHOS 126 07/06/2018 0941   BILITOT 0.9 07/06/2018 0941   GFRNONAA 46 (L) 07/06/2018 0941   GFRAA 53 (L) 07/06/2018 0941    No results found for: SPEP, UPEP  Lab Results  Component Value Date   WBC 15.3 (H) 07/06/2018   NEUTROABS 13.9 (H) 07/06/2018   HGB 7.9 (L) 07/06/2018   HCT 24.1 (L) 07/06/2018   MCV 90.3 07/06/2018   PLT 338 07/06/2018      Chemistry      Component Value Date/Time   NA 135 07/06/2018 0941   K 3.8 07/06/2018 0941   CL 101 07/06/2018 0941   CO2 25 07/06/2018 0941   BUN 16 07/06/2018 0941   CREATININE 1.10 (H) 07/06/2018 0941      Component Value Date/Time   CALCIUM 8.7 (L) 07/06/2018 0941   ALKPHOS 126 07/06/2018 0941   AST 55 (H) 07/06/2018 0941   ALT 56 (H) 07/06/2018 0941   BILITOT 0.9 07/06/2018 0941       RADIOGRAPHIC STUDIES: I have personally reviewed the radiological images as listed and agreed with the findings in the report. Dg Chest 2 View  Result Date: 07/06/2018 CLINICAL DATA:  Pt states she was diagnosed with right upper lobe lung cancer in April, this is a follow up exam. Pt is also SOB, checking for pneumonia. Also has a history of right breast cancer 2005, HTN. EXAM: CHEST - 2 VIEW COMPARISON:  Chest radiographs, 06/28/2018.  Chest CT, 06/23/2018. FINDINGS: There is a cavitary mass in the right lower lobe that has significantly increased in size from the most recent prior exam. It now measures approximately 7.4 x 6.2 x 6.5 cm. Dependent fluid lies within the cavity. There is hazy  ground-glass opacity in the adjacent right lower lobe most evident centrally. Linear scarring extending from the superior right hilum with associated right upper lobe volume loss is stable from the prior exam. Left lung is hyperexpanded but clear. Cardiac silhouette is normal in size. No mediastinal or hilar  masses. No pleural effusion or pneumothorax. Stable left anterior chest wall Port-A-Cath. Skeletal structures are demineralized.  No convincing bone lesion. IMPRESSION: 1. Cavitary right lower lobe mass has significantly increased in size from the prior study, now measuring 7.4 cm. It contains dependent fluid. There is also adjacent ground-glass type opacity in the right lower lobe, which may reflect inflammation or infection. Recommend follow-up chest CT with contrast for further assessment. 2. No other change. Electronically Signed   By: Lajean Manes M.D.   On: 07/06/2018 10:31   Ct Chest W Contrast  Result Date: 07/07/2018 CLINICAL DATA:  Shortness of breath.  Lung cancer. EXAM: CT CHEST WITH CONTRAST TECHNIQUE: Multidetector CT imaging of the chest was performed during intravenous contrast administration. CONTRAST:  76m OMNIPAQUE IOHEXOL 300 MG/ML  SOLN COMPARISON:  06/23/2018 FINDINGS: Cardiovascular: The heart size appears normal. Trace pericardial fluid appears similar to previous exam. Aortic atherosclerosis. Calcification in the LAD and left circumflex and RCA coronary arteries noted. Presumed tumor thrombus is identified within the mid right pulmonary artery, image 54/5.The right ventricular outflow tract is patent. The left pulmonary artery also appears patent Mediastinum/Nodes: No supraclavicular or axillary adenopathy. Left paratracheal adenopathy measures 1.1 cm, image 44/2. Similar to previous exam. Subcarinal lymph node measures 11 mm, image 60/2. Previously 8 mm. The right upper lobe perihilar mass invading the mediastinum is again noted. This measures 4.2 x 2.9 cm, image 58/2. Previously  2.9 x 2.2 cm. There is encasement and narrowing of the right mainstem bronchus, image 59/5. There is also narrowing of the distal SVC proximal to the cavoatrial junction by tumor. Tumor thrombus is identified within and completely occluding the right main pulmonary artery. Lungs/Pleura: No pleural effusion identified. Moderate changes of emphysema. Large thick walled cavitary lung mass with air-fluid level measures 6.1 by 5.3 cm, image 88/3. previously this measured 1.7 by 1.4 cm. Fibrosis and scarring within the right upper lobe and right apex appears similar to previous exam. Within the superior segment of right lower lobe there is a area of ground-glass attenuation and interlobular thickening which measures 6.2 cm. Upper Abdomen: Unchanged cyst within right lobe of liver measuring 4.2 cm. Lateral segment of left lobe of liver is stable measuring 7.2 cm. Unremarkable appearance of the adrenal glands. Musculoskeletal: No chest wall abnormality. No acute or significant osseous findings. IMPRESSION: 1. Thick walled cavitary mass within the right lower lobe demonstrates significant interval increase in size compared with 06/23/2018. Assuming this represents an area of metastatic disease this is worrisome for progression of disease. Necrotizing pneumonia/pulmonary abscess could have a similar appearance. 2. Increase in size of right perihilar lung mass which invades the mediastinum. The right pulmonary artery is completely occluded and filled with tumor thrombus. There is encasement and narrowing of the right mainstem bronchus. Mass effect and narrowing of the distal SVC is present. 3. Increase in size of enlarged subcarinal lymph node. 4. Regional area of ground-glass attenuation and interlobular septal thickening is noted within the superior segment of right lower lobe. Although nonspecific this may represent an area of pneumonitis. Lymphangitic spread of tumor may also have a similar appearance. 5. Aortic  Atherosclerosis (ICD10-I70.0) and Emphysema (ICD10-J43.9). 6. Coronary artery atherosclerotic calcifications. Electronically Signed   By: TKerby MoorsM.D.   On: 07/07/2018 13:27     ASSESSMENT & PLAN:  [place]Right upper lobe lung cancer-squamous cell-stage III/ unresectable; on carboplatin Taxol with radiation; currently status post chemo-radiation July 18th.  CT scan chest done this morning shows-worsening right cavitary lesion/with fluid [question infection versus progressive malignancy]; right hilar progressive consolidation [question radiation changes versus progressive malignancy].  #Continue to HOLD consolidation durvalumab given the acute issues [see discussion below]  #Right lower lobe cavitary lesion-progressive getting worse [infection versus progressive malignancy].  Recommend IV antibiotics with anaerobic coverage.  Discussed with Dr. Juanell Fairly.  Patient may need a bronchoscopy.  Appreciate pulmonary evaluation.  #Right hilar consolidation-progressive again malignancy versus radiation changes.   I had a long discussion with the patient and her son regarding the concerning finding on the CT scan.  Discussed the obvious possibility of progressive malignancy-if patient does not improve post antibiotics; repeat imaging.    #Anemia-hemoglobin 7.8.  If continues to stay less than 8 recommend 1 unit of PRBC transfusion.  #Recommend palliative care evaluation in the hospital.  If patient's condition continues to decline hospice would be reasonable.  #Follow-up with me post discharge in hospital.  Discussed with Dr. Juanell Fairly.  Also discussed with Dr. Genia Harold who kindly agreed to admit the patient hospital.  Discussed with the patient and son in detail.  # I reviewed the blood work- with the patient in detail; also reviewed the imaging independently [as summarized above]; and with the patient in detail.   # 40 minutes face-to-face with the patient  discussing the above plan of care; more than 50% of time spent on prognosis/ natural history; counseling and coordination.     No orders of the defined types were placed in this encounter.  All questions were answered. The patient knows to call the clinic with any problems, questions or concerns.      [name], MD 07/07/2018 4:36 PM

## 2018-07-07 NOTE — Progress Notes (Signed)
Family Meeting Note  Advance Directive:yes  Today a meeting took place with the Patient.  The following clinical team members were present during this meeting:MD  The following were discussed:Patient's diagnosis: Right upper lobe cavitary lesion,  Stage III lung cancer patient's progosis: Unable to determine and Goals for treatment: DNR  Additional follow-up to be provided: DNR ordered and sheet signed and placed in patient's chart  Time spent during discussion: Extina minutes  Obdulio Mash, MD

## 2018-07-07 NOTE — Progress Notes (Signed)
Call report to [facility] on 1C.   1628- Orderly to transported pt via w/c to medical mall admit/registration desk for admission. Patient is assigned to 123.  Port accessed prior to admission by Nira Conn, Therapist, sports. Patient has a peripheral IV access from CT dept in right forearm that is salined locked. Patient is alert and oriented. Full code. s/p chemo/radiation completion. Patient presented to clinic today for ct scan results, which demonstrated worsening pneumonia/infection vs lung abcess vs disease progression. Per Dr. Rogue Bussing- Md spoke with pulmonary Dr. Gayla Doss who will be consulted on this [facility] admission. May proceed with possible bronchoscopy. Patient will receive IV antibiotics. Dr. B recommended IV zosyn. Pt to be admitted under the care of Dr. Benjie Karvonen for the hospitalization.  Patient agreeable. Son will take her life alert home so this device is not lost. Son removed the life alert while in the cancer center and put it in his pocket. Son is a Engineer, structural. He would like to stay with the patient until the patient is in the inpatient room, then he will need to go to work.

## 2018-07-07 NOTE — Progress Notes (Signed)
Pharmacy Antibiotic Note  Brenda Parker is a 82 y.o. female admitted on 07/07/2018 with aspiration pneumonia.  Pharmacy has been consulted for meropenem dosing.  Pt initially ordered Zosyn. Per pt (via RN), pt gets "dots all over" (rash listed in chart) and reaction was in 1972; pt states no trouble breathing. Discussed with MD, changed to meropenem.   Plan: Meropenem 1 g IV q12h based on current renal function.      Temp (24hrs), Avg:97.8 F (36.6 C), Min:97.6 F (36.4 C), Max:98 F (36.7 C)  Recent Labs  Lab 07/06/18 0941  WBC 15.3*  CREATININE 1.10*    Estimated Creatinine Clearance: 35.2 mL/min (A) (by C-G formula based on SCr of 1.1 mg/dL (H)).    Allergies  Allergen Reactions  . Penicillins Rash    Patient had a rash after an injection in 1973.  Unsure if taken orally will cause any reaction  Has patient had a PCN reaction causing immediate rash, facial/tongue/throat swelling, SOB or lightheadedness with hypotension: Yes Has patient had a PCN reaction causing severe rash involving mucus membranes or skin necrosis: No Has patient had a PCN reaction that required hospitalization: No Has patient had a PCN reaction occurring within the last 10 years: No     Antimicrobials this admission: Meropenem 9/13 >>  Dose adjustments this admission:   Microbiology results:  Thank you for allowing pharmacy to be a part of this patient's care.  Rocky Morel 07/07/2018 5:34 PM

## 2018-07-08 DIAGNOSIS — Z803 Family history of malignant neoplasm of breast: Secondary | ICD-10-CM

## 2018-07-08 DIAGNOSIS — Z87891 Personal history of nicotine dependence: Secondary | ICD-10-CM

## 2018-07-08 DIAGNOSIS — C3431 Malignant neoplasm of lower lobe, right bronchus or lung: Secondary | ICD-10-CM

## 2018-07-08 DIAGNOSIS — Z880 Allergy status to penicillin: Secondary | ICD-10-CM

## 2018-07-08 DIAGNOSIS — D649 Anemia, unspecified: Secondary | ICD-10-CM

## 2018-07-08 DIAGNOSIS — Z9221 Personal history of antineoplastic chemotherapy: Secondary | ICD-10-CM

## 2018-07-08 LAB — BASIC METABOLIC PANEL
ANION GAP: 7 (ref 5–15)
BUN: 15 mg/dL (ref 8–23)
CALCIUM: 8.1 mg/dL — AB (ref 8.9–10.3)
CHLORIDE: 105 mmol/L (ref 98–111)
CO2: 25 mmol/L (ref 22–32)
Creatinine, Ser: 1.05 mg/dL — ABNORMAL HIGH (ref 0.44–1.00)
GFR calc non Af Amer: 49 mL/min — ABNORMAL LOW (ref >60)
GFR, EST AFRICAN AMERICAN: 57 mL/min — AB (ref >60)
GLUCOSE: 91 mg/dL (ref 70–99)
Potassium: 3.6 mmol/L (ref 3.5–5.1)
Sodium: 137 mmol/L (ref 135–145)

## 2018-07-08 LAB — CBC
HEMATOCRIT: 22.2 % — AB (ref 35.0–47.0)
HEMOGLOBIN: 7.5 g/dL — AB (ref 12.0–16.0)
MCH: 30.9 pg (ref 26.0–34.0)
MCHC: 34 g/dL (ref 32.0–36.0)
MCV: 91.1 fL (ref 80.0–100.0)
Platelets: 299 10*3/uL (ref 150–440)
RBC: 2.43 MIL/uL — AB (ref 3.80–5.20)
RDW: 18.7 % — ABNORMAL HIGH (ref 11.5–14.5)
WBC: 9.4 10*3/uL (ref 3.6–11.0)

## 2018-07-08 NOTE — Progress Notes (Signed)
PT Cancellation Note  Patient Details Name: Brenda Parker MRN: [id number] DOB: 1936-06-03   Cancelled Treatment:    Reason Eval/Treat Not Completed: Patient at procedure or test/unavailable.  Order received.  Chart reviewed.  Physician and ST in room upon PT arrival.  Will re-attempt when pt is available.   Roxanne Gates, PT, DPT 07/08/2018, 8:42 AM

## 2018-07-08 NOTE — Evaluation (Signed)
Clinical/Bedside Swallow Evaluation Patient Details  Name: Brenda Parker MRN: [id number] Date of Birth: 06/17/1936  Today's Date: 07/08/2018 Time: SLP Start Time (ACUTE ONLY): 0830 SLP Stop Time (ACUTE ONLY): 0852 SLP Time Calculation (min) (ACUTE ONLY): 22 min  Past Medical History:  Past Medical History:  Diagnosis Date  . Benign liver cyst   . Breast cancer ([place]) 2005   right breast  . COPD (chronic obstructive pulmonary disease) ([place])   . Dyspnea   . Glaucoma   . Hemoptysis 2019  . Hypertension    Not on medication for htn. patient denies this  . Lung mass 01/2018  . Personal history of radiation therapy    Past Surgical History:  Past Surgical History:  Procedure Laterality Date  . BREAST EXCISIONAL BIOPSY Right 2005   positive, radiation  . CHOLECYSTECTOMY  1988  . COLONOSCOPY  2012  . ENDOBRONCHIAL ULTRASOUND N/A 02/21/2018   Procedure: ENDOBRONCHIAL ULTRASOUND;  Surgeon: Laverle Hobby, MD;  Location: [facility] ORS;  Service: Pulmonary;  Laterality: N/A;  . EYE SURGERY Bilateral 2009,2010   cataract extractions with lens implant  . GALLBLADDER SURGERY Bilateral   . IR FLUORO GUIDE PORT INSERTION RIGHT  03/10/2018  . JOINT REPLACEMENT Left 2008   left hip replacement  . PORTACATH PLACEMENT  03/10/2018  . TONSILLECTOMY  1960   HPI:  Per admitting H&P: Brenda Parker  is a 82 y.o. female with a known history of right upper lobe lung cancer, squamous cell stage III status post chemoradiation who was directly from oncology office admitted due to CT scan chest showing worsening right cavitary lesion with question of infection versus progressive malignancy.   Assessment / Plan / Recommendation Clinical Impression  Patient presents with no apparent s/s oropharyngeal dysphagia at bedside. No overt s/s aspiration observed at bedside. Oral phase WFL with adequate oral prep abilities and A-P transit time. No oral residue noted. Oral mech exam WNL. Swallow initiation appeared  timely, vocal quality remained clear throughout evaluation. Patient was observed to take multiple sips thin liquid via straw and bites of egg biscuit without difficulty. Nursing and patient report toleration of current diet with good PO intake and no s/s aspiration. Patient denies any history of difficulty swallowing and reports she has just had difficulty having an appetite lately. Recommend continue with current regular diet with thin liquids, meds may be given whole with thin liquid. SLP to sign off at this time, no apparent evidence of dysphagia at bedside and no needs identified. Please reconsult with any future change in status. SLP Visit Diagnosis: Dysphagia, unspecified (R13.10)    Aspiration Risk  No limitations    Diet Recommendation Regular;Thin liquid   Liquid Administration via: Cup;Straw Medication Administration: Whole meds with liquid Supervision: Patient able to self feed Postural Changes: Seated upright at 90 degrees;Remain upright for at least 30 minutes after po intake    Other  Recommendations Oral Care Recommendations: Oral care BID   Follow up Recommendations None      Frequency and Duration Other (Comment)(No evidence of dysphagia,no needs identified,SLP to sign off)          Prognosis Prognosis for Safe Diet Advancement: Good      Swallow Study   General Date of Onset: 07/08/18 HPI: Per admitting H&P: Brenda Parker  is a 82 y.o. female with a known history of right upper lobe lung cancer, squamous cell stage III status post chemoradiation who was directly from oncology office admitted due to CT scan chest showing worsening  right cavitary lesion with question of infection versus progressive malignancy. Type of Study: Bedside Swallow Evaluation Diet Prior to this Study: Regular;Thin liquids Temperature Spikes Noted: No Respiratory Status: Room air History of Recent Intubation: No Behavior/Cognition: Alert;Cooperative;Pleasant mood Oral Cavity Assessment: Within  Functional Limits Oral Care Completed by SLP: No Oral Cavity - Dentition: Dentures, top;Dentures, bottom Self-Feeding Abilities: Able to feed self Patient Positioning: Upright in bed Baseline Vocal Quality: Normal Volitional Cough: Congested Volitional Swallow: Able to elicit    Oral/Motor/Sensory Function Overall Oral Motor/Sensory Function: Within functional limits   Ice Chips Ice chips: Not tested   Thin Liquid Thin Liquid: Within functional limits Presentation: Self Fed;Straw    Nectar Thick     Honey Thick     Puree     Solid     Solid: Within functional limits Presentation: Self Fed      [name], MA, CCC-SLP 07/08/2018,8:56 AM

## 2018-07-08 NOTE — Evaluation (Signed)
Physical Therapy Evaluation Patient Details Name: Brenda Parker MRN: [id number] DOB: [birth year] Today's Date: 07/08/2018   History of Present Illness  Patient is an 82 year old female admitted for aspiration pneumonia.  PMH includes squamous cell lung CA st. III, COPD, CKD and breast CA.  Clinical Impression  Patient is an 82 year old female who lives alone.  She has recently started using a RW for household ambulation and has family available to provide groceries, etc.  Pt presented with generalized weakness of UE's and fair strength of LE's.  Pt was independent with bed mobility and able to stand with use of RW.  She ambulated 50 ft with RW with supervision demonstrating mild gait deviations and no LOB's.  Pt is able to remove hands from RW for short periods of time to perform functional activity.  Pt was visibly fatigued following 50 ft of ambulation with RW and reported feeling "tired".  PT educated pt regarding frequency of participation in [place] and home setup for fall prevention.  She stated that she is confident in her exercises and that she is able to self motivate. Pt will benefit from PT in hospital to prevent deconditioning but will not require PT follow up after discharge.    Follow Up Recommendations No PT follow up    Equipment Recommendations  None recommended by PT    Recommendations for Other Services       Precautions / Restrictions        Mobility  Bed Mobility Overal bed mobility: Independent             General bed mobility comments: No assistance needed to get to EOB.  Good sitting balance.  Transfers Overall transfer level: Modified independent Equipment used: Rolling walker (2 wheeled)             General transfer comment: Able to rise from recliner and toilet without difficulty.  Uses B UE's to push up from surface.  Ambulation/Gait Ambulation/Gait assistance: Supervision Gait Distance (Feet): 50 Feet Assistive device: Rolling walker (2  wheeled)     Gait velocity interpretation: 1.31 - 2.62 ft/sec, indicative of limited community ambulator General Gait Details: Moderate foot clearance and good step length, no LOB's, good use of RW.  Pt appeared visibly fatigued and stated that she was starting to feel "tired" following ambulation.  Stairs            Wheelchair Mobility    Modified Rankin (Stroke Patients Only)       Balance Overall balance assessment: Needs assistance Sitting-balance support: Feet supported Sitting balance-Leahy Scale: Good     Standing balance support: Bilateral upper extremity supported Standing balance-Leahy Scale: Fair Standing balance comment: Pt is able to stand for short periods of time without use of RW but requires it for functional mobility, stating that she feels unsteady.                             Pertinent Vitals/Pain Pain Assessment: No/denies pain    Home Living Family/patient expects to be discharged to:: Private residence Living Arrangements: Alone Available Help at Discharge: Available PRN/intermittently;Family;Friend(s)   Home Access: Level entry(pt reports small threshold to enter home and is able to use RW for support.)     Home Layout: One level Home Equipment: Walker - 2 wheels(does not use)      Prior Function Level of Independence: Independent         Comments: Pt  is able to perform household ambulation with RW.  She has recently completed [facility] and is comfortable with HEP.     Hand Dominance        Extremity/Trunk Assessment   Upper Extremity Assessment Upper Extremity Assessment: Generalized weakness(Grossly 4-/5 bilaterally.)    Lower Extremity Assessment Lower Extremity Assessment: Overall WFL for tasks assessed(Grossly 4/5 bilaterally.)    Cervical / Trunk Assessment Cervical / Trunk Assessment: Normal  Communication   Communication: No difficulties  Cognition Arousal/Alertness: Awake/alert Behavior During Therapy:  WFL for tasks assessed/performed Overall Cognitive Status: Within Functional Limits for tasks assessed                                 General Comments: Follows commands consistently.      General Comments      Exercises Other Exercises Other Exercises: Educated pt regarding management of HEP and household setup to prevent falls.  x10 min   Assessment/Plan    PT Assessment Patient needs continued PT services  PT Problem List Decreased strength;Decreased range of motion;Decreased activity tolerance;Decreased balance;Decreased mobility;Decreased coordination;Decreased knowledge of use of DME;Decreased safety awareness       PT Treatment Interventions DME instruction;Gait training;Stair training;Functional mobility training;Therapeutic activities;Therapeutic exercise;Balance training;Neuromuscular re-education;Patient/family education    PT Goals (Current goals can be found in the Care Plan section)  Acute Rehab PT Goals Patient Stated Goal: to return home and resume daily functional. PT Goal Formulation: With patient Time For Goal Achievement: 07/22/18 Potential to Achieve Goals: Good    Frequency Min 2X/week   Barriers to discharge        Co-evaluation               AM-PAC PT "6 Clicks" Daily Activity  Outcome Measure Difficulty turning over in bed (including adjusting bedclothes, sheets and blankets)?: None Difficulty moving from lying on back to sitting on the side of the bed? : None Difficulty sitting down on and standing up from a chair with arms (e.g., wheelchair, bedside commode, etc,.)?: None Help needed moving to and from a bed to chair (including a wheelchair)?: None Help needed walking in hospital room?: A Little Help needed climbing 3-5 steps with a railing? : A Lot 6 Click Score: 21    End of Session Equipment Utilized During Treatment: Gait belt Activity Tolerance: Patient tolerated treatment well Patient left: with bed alarm set;with  call bell/phone within reach Nurse Communication: Mobility status PT Visit Diagnosis: Difficulty in walking, not elsewhere classified (R26.2);Muscle weakness (generalized) (M62.81);History of falling (Z91.81)    Time: 9741-6384 PT Time Calculation (min) (ACUTE ONLY): 17 min   Charges:   PT Evaluation $PT Eval Low Complexity: 1 Low PT Treatments $Therapeutic Activity: 8-22 mins       Roxanne Gates, PT, DPT   Roxanne Gates 07/08/2018, 11:23 AM

## 2018-07-08 NOTE — Plan of Care (Signed)
  Problem: Education: Goal: Knowledge of General Education information will improve Description Including pain rating scale, medication(s)/side effects and non-pharmacologic comfort measures Outcome: Progressing   Problem: Health Behavior/Discharge Planning: Goal: Ability to manage health-related needs will improve Outcome: Progressing   Problem: Clinical Measurements: Goal: Ability to maintain clinical measurements within normal limits will improve Outcome: Progressing Goal: Will remain free from infection Outcome: Progressing Goal: Diagnostic test results will improve Outcome: Progressing Goal: Respiratory complications will improve Outcome: Progressing Goal: Cardiovascular complication will be avoided Outcome: Progressing Goal: Will remain free from infection Outcome: Progressing Goal: Cardiovascular complication will be avoided Outcome: Progressing   Problem: Nutrition: Goal: Adequate nutrition will be maintained Outcome: Progressing Goal: Adequate nutrition will be maintained Outcome: Progressing   Problem: Coping: Goal: Level of anxiety will decrease Outcome: Progressing   Problem: Elimination: Goal: Will not experience complications related to bowel motility Outcome: Progressing Goal: Will not experience complications related to urinary retention Outcome: Progressing Goal: Will not experience complications related to urinary retention Outcome: Progressing   Problem: Pain Managment: Goal: General experience of comfort will improve Outcome: Progressing Goal: General experience of comfort will improve Outcome: Progressing   Problem: Safety: Goal: Ability to remain free from injury will improve Outcome: Progressing   Problem: Skin Integrity: Goal: Risk for impaired skin integrity will decrease Outcome: Progressing Goal: Risk for impaired skin integrity will decrease Outcome: Progressing   Problem: Activity: Goal: Risk for activity intolerance will  decrease Outcome: Progressing   Problem: Safety: Goal: Ability to remain free from injury will improve Outcome: Progressing

## 2018-07-08 NOTE — Progress Notes (Signed)
[facility] [place] NAME: Brenda Parker    MR#:  [id number]  DATE OF BIRTH:  [birth year]  SUBJECTIVE:   Patient doing well without any issues overnight  REVIEW OF SYSTEMS:    Review of Systems  Constitutional: Negative for fever, chills weight loss HENT: Negative for ear pain, nosebleeds, congestion, facial swelling, rhinorrhea, neck pain, neck stiffness and ear discharge.   Respiratory: Negative for cough, shortness of breath, wheezing  Cardiovascular: Negative for chest pain, palpitations and leg swelling.  Gastrointestinal: Negative for heartburn, abdominal pain, vomiting, diarrhea or consitpation Genitourinary: Negative for dysuria, urgency, frequency, hematuria Musculoskeletal: Negative for back pain or joint pain Neurological: Negative for dizziness, seizures, syncope, focal weakness,  numbness and headaches.  Hematological: Does not bruise/bleed easily.  Psychiatric/Behavioral: Negative for hallucinations, confusion, dysphoric mood    Tolerating Diet: yes      DRUG ALLERGIES:   Allergies  Allergen Reactions  . Penicillins Rash    Patient had a rash after an injection in 1973.  Unsure if taken orally will cause any reaction  Has patient had a PCN reaction causing immediate rash, facial/tongue/throat swelling, SOB or lightheadedness with hypotension: Yes Has patient had a PCN reaction causing severe rash involving mucus membranes or skin necrosis: No Has patient had a PCN reaction that required hospitalization: No Has patient had a PCN reaction occurring within the last 10 years: No     VITALS:  Blood pressure (!) 117/55, pulse 90, temperature 97.9 F (36.6 C), temperature source Oral, resp. rate 16, height 5\' 4"  (1.626 m), weight 64.9 kg, SpO2 97 %.  PHYSICAL EXAMINATION:  Constitutional: Appears well-developed and well-nourished. No distress. HENT: Normocephalic. Marland Kitchen Oropharynx is clear and moist.  Eyes: Conjunctivae and  EOM are normal. PERRLA, no scleral icterus.  Neck: Normal ROM. Neck supple. No JVD. No tracheal deviation. CVS: RRR, S1/S2 +, no murmurs, no gallops, no carotid bruit.  Pulmonary: Effort and breath sounds normal, no stridor, rhonchi, wheezes, rales.  Abdominal: Soft. BS +,  no distension, tenderness, rebound or guarding.  Musculoskeletal: Normal range of motion. No edema and no tenderness.  Neuro: Alert. CN 2-12 grossly intact. No focal deficits. Skin: Skin is warm and dry. No rash noted. Psychiatric: Normal mood and affect.      LABORATORY PANEL:   CBC Recent Labs  Lab 07/08/18 0530  WBC 9.4  HGB 7.5*  HCT 22.2*  PLT 299   ------------------------------------------------------------------------------------------------------------------  Chemistries  Recent Labs  Lab 07/06/18 0941  07/08/18 0530  NA 135  --  137  K 3.8  --  3.6  CL 101  --  105  CO2 25  --  25  GLUCOSE 117*  --  91  BUN 16  --  15  CREATININE 1.10*   < > 1.05*  CALCIUM 8.7*  --  8.1*  AST 55*  --   --   ALT 56*  --   --   ALKPHOS 126  --   --   BILITOT 0.9  --   --    < > = values in this interval not displayed.   ------------------------------------------------------------------------------------------------------------------  Cardiac Enzymes No results for input(s): TROPONINI in the last 168 hours. ------------------------------------------------------------------------------------------------------------------  RADIOLOGY:  Dg Chest 2 View  Result Date: 07/06/2018 CLINICAL DATA:  Pt states she was diagnosed with right upper lobe lung cancer in April, this is a follow up exam. Pt is also SOB, checking for pneumonia. Also has a history of  right breast cancer 2005, HTN. EXAM: CHEST - 2 VIEW COMPARISON:  Chest radiographs, 06/28/2018.  Chest CT, 06/23/2018. FINDINGS: There is a cavitary mass in the right lower lobe that has significantly increased in size from the most recent prior exam. It now  measures approximately 7.4 x 6.2 x 6.5 cm. Dependent fluid lies within the cavity. There is hazy ground-glass opacity in the adjacent right lower lobe most evident centrally. Linear scarring extending from the superior right hilum with associated right upper lobe volume loss is stable from the prior exam. Left lung is hyperexpanded but clear. Cardiac silhouette is normal in size. No mediastinal or hilar masses. No pleural effusion or pneumothorax. Stable left anterior chest wall Port-A-Cath. Skeletal structures are demineralized.  No convincing bone lesion. IMPRESSION: 1. Cavitary right lower lobe mass has significantly increased in size from the prior study, now measuring 7.4 cm. It contains dependent fluid. There is also adjacent ground-glass type opacity in the right lower lobe, which may reflect inflammation or infection. Recommend follow-up chest CT with contrast for further assessment. 2. No other change. Electronically Signed   By: Lajean Manes M.D.   On: 07/06/2018 10:31   Ct Chest W Contrast  Result Date: 07/07/2018 CLINICAL DATA:  Shortness of breath.  Lung cancer. EXAM: CT CHEST WITH CONTRAST TECHNIQUE: Multidetector CT imaging of the chest was performed during intravenous contrast administration. CONTRAST:  77mL OMNIPAQUE IOHEXOL 300 MG/ML  SOLN COMPARISON:  06/23/2018 FINDINGS: Cardiovascular: The heart size appears normal. Trace pericardial fluid appears similar to previous exam. Aortic atherosclerosis. Calcification in the LAD and left circumflex and RCA coronary arteries noted. Presumed tumor thrombus is identified within the mid right pulmonary artery, image 54/5.The right ventricular outflow tract is patent. The left pulmonary artery also appears patent Mediastinum/Nodes: No supraclavicular or axillary adenopathy. Left paratracheal adenopathy measures 1.1 cm, image 44/2. Similar to previous exam. Subcarinal lymph node measures 11 mm, image 60/2. Previously 8 mm. The right upper lobe perihilar  mass invading the mediastinum is again noted. This measures 4.2 x 2.9 cm, image 58/2. Previously 2.9 x 2.2 cm. There is encasement and narrowing of the right mainstem bronchus, image 59/5. There is also narrowing of the distal SVC proximal to the cavoatrial junction by tumor. Tumor thrombus is identified within and completely occluding the right main pulmonary artery. Lungs/Pleura: No pleural effusion identified. Moderate changes of emphysema. Large thick walled cavitary lung mass with air-fluid level measures 6.1 by 5.3 cm, image 88/3. previously this measured 1.7 by 1.4 cm. Fibrosis and scarring within the right upper lobe and right apex appears similar to previous exam. Within the superior segment of right lower lobe there is a area of ground-glass attenuation and interlobular thickening which measures 6.2 cm. Upper Abdomen: Unchanged cyst within right lobe of liver measuring 4.2 cm. Lateral segment of left lobe of liver is stable measuring 7.2 cm. Unremarkable appearance of the adrenal glands. Musculoskeletal: No chest wall abnormality. No acute or significant osseous findings. IMPRESSION: 1. Thick walled cavitary mass within the right lower lobe demonstrates significant interval increase in size compared with 06/23/2018. Assuming this represents an area of metastatic disease this is worrisome for progression of disease. Necrotizing pneumonia/pulmonary abscess could have a similar appearance. 2. Increase in size of right perihilar lung mass which invades the mediastinum. The right pulmonary artery is completely occluded and filled with tumor thrombus. There is encasement and narrowing of the right mainstem bronchus. Mass effect and narrowing of the distal SVC is present. 3. Increase  in size of enlarged subcarinal lymph node. 4. Regional area of ground-glass attenuation and interlobular septal thickening is noted within the superior segment of right lower lobe. Although nonspecific this may represent an area of  pneumonitis. Lymphangitic spread of tumor may also have a similar appearance. 5. Aortic Atherosclerosis (ICD10-I70.0) and Emphysema (ICD10-J43.9). 6. Coronary artery atherosclerotic calcifications. Electronically Signed   By: Kerby Moors M.D.   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  1.  Right cavitary lung lesion: Continue IV Meropenem ( PCN ALLERGY) Pulmonary evaluation for bronchoscopy suite early next week  2.  Right upper lobe squamous cell stage III lung cancer: Oncology evaluation requested.  3.  Anemia of chronic disease: Hemoglobin 7.5 this morning baseline 8-9 Will follow for a.m. No blood transfusion today   4.  COPD without signs of exacerbation  5.  Chronic kidney disease stage III creatinine is at baseline      Management plans discussed with the patient and she is in agreement.  CODE STATUS: DNR  TOTAL TIME TAKING CARE OF THIS PATIENT: 28 minutes.     POSSIBLE D/C 2 to 3 days, DEPENDING ON CLINICAL CONDITION.   Essa Wenk M.D on 07/08/2018 at 9:35 AM  Between 7am to 6pm - Pager - [phone] After 6pm go to [email or web address] - password [email or web address] [facility]  Office  [phone]  CC: Primary care physician; Leonel Ramsay, MD  Note: This dictation was prepared with Dragon dictation along with smaller phrase technology. Any transcriptional errors that result from this process are unintentional.

## 2018-07-08 NOTE — Consult Note (Signed)
Hematology/Oncology Consult note [facility] Telephone:([phone] [phone]  Patient Care Team: Leonel Ramsay, MD as PCP - General (Infectious Diseases) Telford Nab, RN as Registered Nurse [name], MD as Medical Oncologist (Medical Oncology)   Name of the patient: Brenda Parker  [id number]  05/24/1936    Reason for consult: h/o lung cancer s/p chemo/RT   Requesting physician: Dr. 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 She completed chemo/RT in July 2019 and was on maintenance durvalumab. She was seen by DR. [name] yesterday and CT chest showed worsening RLL cavitary lesion. She was direct admitted to hospital for IV antibiotics and consideration for bronchoscopy to r/o infection versus malignancy  She is sitting up in a chair by the bedside. Appears comfortable. Reports fatigue. SOB is at her baseline. Denies any fever or cough  ECOG PS- 2  Pain scale- 0   Review of systems- Review of Systems  Constitutional: Positive for malaise/fatigue. Negative for chills, fever and weight loss.  HENT: Negative for congestion, ear discharge and nosebleeds.   Eyes: Negative for blurred vision.  Respiratory: Positive for shortness of breath. Negative for cough, hemoptysis, sputum production and wheezing.   Cardiovascular: Negative for chest pain, palpitations, orthopnea and claudication.  Gastrointestinal: Negative for abdominal pain, blood in stool, constipation, diarrhea, heartburn, melena, nausea and vomiting.  Genitourinary: Negative for dysuria, flank pain, frequency, hematuria and urgency.  Musculoskeletal: Negative for back pain, joint pain and myalgias.  Skin: Negative for rash.  Neurological: Negative for dizziness, tingling, focal weakness, seizures, weakness and headaches.  Endo/Heme/Allergies: Does not bruise/bleed easily.   Psychiatric/Behavioral: Negative for depression and suicidal ideas. The patient does not have insomnia.     Allergies  Allergen Reactions  . Penicillins Rash    Patient had a rash after an injection in 1973.  Unsure if taken orally will cause any reaction  Has patient had a PCN reaction causing immediate rash, facial/tongue/throat swelling, SOB or lightheadedness with hypotension: Yes Has patient had a PCN reaction causing severe rash involving mucus membranes or skin necrosis: No Has patient had a PCN reaction that required hospitalization: No Has patient had a PCN reaction occurring within the last 10 years: No     Patient Active Problem List   Diagnosis Date Noted  . PNA (pneumonia) 07/07/2018  . Pneumonia 06/28/2018  . Primary cancer of right upper lobe of lung ([place]) 03/03/2018  . Hyperlipidemia, unspecified 02/08/2018  . Hypertension 02/08/2018  . Chronic cough 08/05/2017  . Lesion of liver 07/04/2017  . Abnormal CXR 07/01/2017  . Psoriasis of scalp 12/22/2015  . Back pain with left-sided sciatica 06/16/2015  . 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 Impaired fasting glucose 12/13/2012  . Chronic kidney disease 03/28/2012  . Seborrheic dermatitis 03/28/2012     Past Medical History:  Diagnosis Date  . Benign liver cyst   . Breast cancer ([place]) 2005   right breast  . COPD (chronic obstructive pulmonary disease) ([place])   . Dyspnea   . Glaucoma   . Hemoptysis 2019  . Hypertension    Not on medication for htn. patient denies this  . Lung mass 01/2018  . Personal history of radiation therapy      Past Surgical History:  Procedure Laterality Date  . BREAST EXCISIONAL BIOPSY Right 2005   positive, radiation  . CHOLECYSTECTOMY  1988  .  COLONOSCOPY  2012  . ENDOBRONCHIAL ULTRASOUND N/A 02/21/2018   Procedure: ENDOBRONCHIAL ULTRASOUND;  Surgeon: Laverle Hobby, MD;  Location: [facility] ORS;  Service: Pulmonary;  Laterality: N/A;  . EYE SURGERY Bilateral 2009,2010    cataract extractions with lens implant  . GALLBLADDER SURGERY Bilateral   . IR FLUORO GUIDE PORT INSERTION RIGHT  03/10/2018  . JOINT REPLACEMENT Left 2008   left hip replacement  . PORTACATH PLACEMENT  03/10/2018  . TONSILLECTOMY  1960    Social History   Socioeconomic History  . Marital status: Widowed    Spouse name: Not on file  . Number of children: Not on file  . Years of education: Not on file  . Highest education level: Not on file  Occupational History  . Not on file  Social Needs  . Financial resource strain: Not on file  . Food insecurity:    Worry: Not on file    Inability: Not on file  . Transportation needs:    Medical: Not on file    Non-medical: Not on file  Tobacco Use  . Smoking status: Former Smoker    Packs/day: 1.00    Types: Cigarettes    Last attempt to quit: 10/25/2005    Years since quitting: 12.7  . Smokeless tobacco: Never Used  Substance and Sexual Activity  . Alcohol use: Not Currently    Comment: occasional glass of beer  . Drug use: Never  . Sexual activity: Not Currently  Lifestyle  . Physical activity:    Days per week: Not on file    Minutes per session: Not on file  . Stress: Not on file  Relationships  . Social connections:    Talks on phone: Not on file    Gets together: Not on file    Attends religious service: Not on file    Active member of club or organization: Not on file    Attends meetings of clubs or organizations: Not on file    Relationship status: Not on file  . Intimate partner violence:    Fear of current or ex partner: Not on file    Emotionally abused: Not on file    Physically abused: Not on file    Forced sexual activity: Not on file  Other Topics Concern  . Not on file  Social History Narrative  . Not on file     Family History  Problem Relation Age of Onset  . Breast cancer Mother 46  . CAD Father      Current Facility-Administered Medications:  .  0.9 %  sodium chloride infusion, ,  Intravenous, Continuous, Mody, Sital, MD, Last Rate: 75 mL/hr at 07/08/18 0830 .  acetaminophen (TYLENOL) tablet 650 mg, 650 mg, Oral, Q6H PRN **OR** acetaminophen (TYLENOL) suppository 650 mg, 650 mg, Rectal, Q6H PRN, Mody, Sital, MD .  albuterol (PROVENTIL) (2.5 MG/3ML) 0.083% nebulizer solution 2.5 mg, 2.5 mg, Inhalation, Q6H PRN, Mody, Sital, MD .  bisacodyl (DULCOLAX) EC tablet 5 mg, 5 mg, Oral, Daily PRN, Benjie Karvonen, Sital, MD .  cholecalciferol (VITAMIN D) tablet 3,000 Units, 3,000 Units, Oral, Daily, Bettey Costa, MD, 3,000 Units at 07/08/18 0833 .  enoxaparin (LOVENOX) injection 40 mg, 40 mg, Subcutaneous, Q24H, Mody, Sital, MD, 40 mg at 07/07/18 2149 .  latanoprost (XALATAN) 0.005 % ophthalmic solution 1 drop, 1 drop, Both Eyes, QHS, Mody, Sital, MD, 1 drop at 07/07/18 2149 .  meropenem (MERREM) 1 g in sodium chloride 0.9 % 100 mL IVPB, 1 g, Intravenous, Q12H,  Rocky Morel, [facility], Last Rate: 200 mL/hr at 07/08/18 0833, 1 g at 07/08/18 0833 .  mometasone-formoterol (DULERA) 200-5 MCG/ACT inhaler 2 puff, 2 puff, Inhalation, BID, Bettey Costa, MD, 2 puff at 07/08/18 0833 .  multivitamin with minerals tablet 1 tablet, 1 tablet, Oral, Daily, Mody, Sital, MD, 1 tablet at 07/08/18 0833 .  ondansetron (ZOFRAN) tablet 4 mg, 4 mg, Oral, Q6H PRN **OR** ondansetron (ZOFRAN) injection 4 mg, 4 mg, Intravenous, Q6H PRN, Mody, Sital, MD .  oxyCODONE (Oxy IR/ROXICODONE) immediate release tablet 5 mg, 5 mg, Oral, BID PRN, Mody, Sital, MD .  polyethylene glycol (MIRALAX / GLYCOLAX) packet 17 g, 17 g, Oral, Daily PRN, Mody, Sital, MD .  senna-docusate (Senokot-S) tablet 1 tablet, 1 tablet, Oral, QHS PRN, Bettey Costa, MD   Physical exam:  Vitals:   07/07/18 1650 07/07/18 2027 07/08/18 0507 07/08/18 0826  BP: 136/69 (!) 103/52 (!) 110/55 (!) 117/55  Pulse: 85 92 92 90  Resp:  18 20 16   Temp: 98 F (36.7 C) 98.1 F (36.7 C) 98.1 F (36.7 C) 97.9 F (36.6 C)  TempSrc: Oral Oral Oral Oral  SpO2: 99% 94% 93% 97%   Weight: 143 lb (64.9 kg)     Height: 5\' 4"  (1.626 m)      Physical Exam  Constitutional: She is oriented to person, place, and time.  Thin elderly female in no acute distress  HENT:  Head: Normocephalic and atraumatic.  Eyes: Pupils are equal, round, and reactive to light. EOM are normal.  Neck: Normal range of motion.  Cardiovascular: Normal rate, regular rhythm and normal heart sounds.  Pulmonary/Chest: Effort normal and breath sounds normal.  Abdominal: Soft. Bowel sounds are normal.  Musculoskeletal: She exhibits no edema.  Neurological: She is alert and oriented to person, place, and time.  Skin: Skin is warm and dry.       CMP Latest Ref Rng & Units 07/08/2018  Glucose 70 - 99 mg/dL 91  BUN 8 - 23 mg/dL 15  Creatinine 0.44 - 1.00 mg/dL 1.05(H)  Sodium 135 - 145 mmol/L 137  Potassium 3.5 - 5.1 mmol/L 3.6  Chloride 98 - 111 mmol/L 105  CO2 22 - 32 mmol/L 25  Calcium 8.9 - 10.3 mg/dL 8.1(L)  Total Protein 6.5 - 8.1 g/dL -  Total Bilirubin 0.3 - 1.2 mg/dL -  Alkaline Phos 38 - 126 U/L -  AST 15 - 41 U/L -  ALT 0 - 44 U/L -   CBC Latest Ref Rng & Units 07/08/2018  WBC 3.6 - 11.0 K/uL 9.4  Hemoglobin 12.0 - 16.0 g/dL 7.5(L)  Hematocrit 35.0 - 47.0 % 22.2(L)  Platelets 150 - 440 K/uL 299    @IMAGES @  Dg Chest 2 View  Result Date: 07/06/2018 CLINICAL DATA:  Pt states she was diagnosed with right upper lobe lung cancer in April, this is a follow up exam. Pt is also SOB, checking for pneumonia. Also has a history of right breast cancer 2005, HTN. EXAM: CHEST - 2 VIEW COMPARISON:  Chest radiographs, 06/28/2018.  Chest CT, 06/23/2018. FINDINGS: There is a cavitary mass in the right lower lobe that has significantly increased in size from the most recent prior exam. It now measures approximately 7.4 x 6.2 x 6.5 cm. Dependent fluid lies within the cavity. There is hazy ground-glass opacity in the adjacent right lower lobe most evident centrally. Linear scarring extending from  the superior right hilum with associated right upper lobe volume loss is stable from  the prior exam. Left lung is hyperexpanded but clear. Cardiac silhouette is normal in size. No mediastinal or hilar masses. No pleural effusion or pneumothorax. Stable left anterior chest wall Port-A-Cath. Skeletal structures are demineralized.  No convincing bone lesion. IMPRESSION: 1. Cavitary right lower lobe mass has significantly increased in size from the prior study, now measuring 7.4 cm. It contains dependent fluid. There is also adjacent ground-glass type opacity in the right lower lobe, which may reflect inflammation or infection. Recommend follow-up chest CT with contrast for further assessment. 2. No other change. Electronically Signed   By: Lajean Manes M.D.   On: 07/06/2018 10:31   Dg Chest 2 View  Result Date: 06/28/2018 CLINICAL DATA:  Cough. History of a lung mass, right breast malignancy, COPD, former smoker. EXAM: CHEST - 2 VIEW COMPARISON:  Chest CT scan of June 23, 2018 and chest x-ray of June 16, 2018 FINDINGS: The cavitary mass like lesion in the right lower lobe has increased in size. The cavitary portion measures as much as 3.8 cm in diameter. There is a stable density projecting over the posterolateral aspect of the right eighth rib and is stable. Density in the inferior aspect of the right upper lobe is fairly stable. The left lung is clear. The heart and pulmonary vascularity are normal. There is calcification in the wall of the aortic arch. The porta catheter tip projects over the midportion of the SVC. IMPRESSION: Interval increase in size of the cavitary process projecting in the right lower lobe. Chest CT scanning is recommended for better characterization of this lesion. Elsewhere the findings in the right lung are clear. Thoracic aortic atherosclerosis. Electronically Signed   By: David  Martinique M.D.   On: 06/28/2018 14:40   Dg Chest 2 View  Result Date: 06/16/2018 CLINICAL DATA:   Hemoptysis, lung cancer EXAM: CHEST - 2 VIEW COMPARISON:  01/16/2018 chest CT FINDINGS: Left internal jugular MediPort terminates in the lower third of the SVC. Heart size. Stable mediastinal contour with prominence of the right lower paratracheal mediastinum. No pneumothorax. No pleural effusion. Curvilinear perihilar right upper lung opacities appear decreased slightly. Small nodular opacity in the peripheral right mid lung is not appreciably changed. No acute consolidative airspace disease. No pulmonary edema. IMPRESSION: 1. No acute cardiopulmonary disease. 2. Stable prominence of the right lower paratracheal with decreased curvilinear right upper parahilar lung opacities, compatible with a combination of known tumor and post treatment change. Stable nodular opacity in the peripheral right mid lung. Electronically Signed   By: Ilona Sorrel M.D.   On: 06/16/2018 13:29   Ct Chest Wo Contrast  Result Date: 06/24/2018 CLINICAL DATA:  Followup right upper lobe non-small cell lung carcinoma. Status post radiation therapy and chemotherapy. EXAM: CT CHEST WITHOUT CONTRAST TECHNIQUE: Multidetector CT imaging of the chest was performed following the standard protocol without IV contrast. COMPARISON:  01/16/2018 FINDINGS: Cardiovascular: No acute findings. Aortic and coronary artery atherosclerosis. Left-sided Port-A-Cath in appropriate position. Mediastinum/Nodes: Mediastinal and right hilar involvement by central right upper lobe mass again noted. No other masses or lymphadenopathy identified. Lungs/Pleura: Soft tissue mass in the central right upper lobe has decreased in size since previous study, currently measuring 2.9 x 2.2 cm on image 49/2, compared to 4.1 x 3.4 cm previously. This continues to obstruct the central right upper lobe bronchus. Decreased airspace opacity is noted in the peripheral right upper lobe, consistent with decreased postobstructive pneumonitis. Increased heterogeneous ground-glass opacity  is seen in both lungs, with greatest involvement in  the lower lobes. New pulmonary nodule with central cavitation is seen right lower lobe measuring 1.7 cm on image 89/3. These findings are suspicious for superimposed infection or drug reaction. No evidence of pleural effusion. Mild centrilobular emphysema is again seen. Upper Abdomen: No evidence of adrenal mass. Stable complex cystic lesions in left and right hepatic lobes. Musculoskeletal:  No suspicious bone lesions. IMPRESSION: Decreased size of central right upper lobe pulmonary mass. Decreased postobstructive pneumonitis. Increased bilateral ground-glass pulmonary opacity with lower lobe predominance, and new 1.7 cm cavitary nodule in the right lower lobe. Differential diagnosis includes superimposed infection and drug reaction, with neoplasm considered less likely. Recommend short-term follow-up by chest CT in 1-2 months. Aortic Atherosclerosis (ICD10-I70.0) and Emphysema (ICD10-J43.9). Coronary artery calcification. Electronically Signed   By: Earle Gell M.D.   On: 06/24/2018 11:31   Ct Chest W Contrast  Result Date: 07/07/2018 CLINICAL DATA:  Shortness of breath.  Lung cancer. EXAM: CT CHEST WITH CONTRAST TECHNIQUE: Multidetector CT imaging of the chest was performed during intravenous contrast administration. CONTRAST:  64mL OMNIPAQUE IOHEXOL 300 MG/ML  SOLN COMPARISON:  06/23/2018 FINDINGS: Cardiovascular: The heart size appears normal. Trace pericardial fluid appears similar to previous exam. Aortic atherosclerosis. Calcification in the LAD and left circumflex and RCA coronary arteries noted. Presumed tumor thrombus is identified within the mid right pulmonary artery, image 54/5.The right ventricular outflow tract is patent. The left pulmonary artery also appears patent Mediastinum/Nodes: No supraclavicular or axillary adenopathy. Left paratracheal adenopathy measures 1.1 cm, image 44/2. Similar to previous exam. Subcarinal lymph node measures 11  mm, image 60/2. Previously 8 mm. The right upper lobe perihilar mass invading the mediastinum is again noted. This measures 4.2 x 2.9 cm, image 58/2. Previously 2.9 x 2.2 cm. There is encasement and narrowing of the right mainstem bronchus, image 59/5. There is also narrowing of the distal SVC proximal to the cavoatrial junction by tumor. Tumor thrombus is identified within and completely occluding the right main pulmonary artery. Lungs/Pleura: No pleural effusion identified. Moderate changes of emphysema. Large thick walled cavitary lung mass with air-fluid level measures 6.1 by 5.3 cm, image 88/3. previously this measured 1.7 by 1.4 cm. Fibrosis and scarring within the right upper lobe and right apex appears similar to previous exam. Within the superior segment of right lower lobe there is a area of ground-glass attenuation and interlobular thickening which measures 6.2 cm. Upper Abdomen: Unchanged cyst within right lobe of liver measuring 4.2 cm. Lateral segment of left lobe of liver is stable measuring 7.2 cm. Unremarkable appearance of the adrenal glands. Musculoskeletal: No chest wall abnormality. No acute or significant osseous findings. IMPRESSION: 1. Thick walled cavitary mass within the right lower lobe demonstrates significant interval increase in size compared with 06/23/2018. Assuming this represents an area of metastatic disease this is worrisome for progression of disease. Necrotizing pneumonia/pulmonary abscess could have a similar appearance. 2. Increase in size of right perihilar lung mass which invades the mediastinum. The right pulmonary artery is completely occluded and filled with tumor thrombus. There is encasement and narrowing of the right mainstem bronchus. Mass effect and narrowing of the distal SVC is present. 3. Increase in size of enlarged subcarinal lymph node. 4. Regional area of ground-glass attenuation and interlobular septal thickening is noted within the superior segment of right  lower lobe. Although nonspecific this may represent an area of pneumonitis. Lymphangitic spread of tumor may also have a similar appearance. 5. Aortic Atherosclerosis (ICD10-I70.0) and Emphysema (ICD10-J43.9). 6. Coronary artery atherosclerotic  calcifications. Electronically Signed   By: Kerby Moors M.D.   On: 07/07/2018 13:27    Assessment and plan- Patient is a 82 y.o. female with h/o Stage III lung cancer s/p chemo/RT in July 2019. Was on maintenance durvalumab currently on hold. Admitted for worsening RLL cavitary lesion - infection versus malignancy  1. RLL cavitary lesion- on empiric meropenem. She is afebrile and hemodynamically stable. Dr. Ashby Dawes to see patient on Monday for possible bronchoscopy. ID input may be needed down the line after pulmonary assessment.  2. Normocytic anemia: Baseline hb ~10. Down to 7.5. Could be bone marrow suppression in the setting of acute issues/ infection. Will order iron studies, b12 and folate for AM labs  Visit Diagnosis 1. Lung cancer 2. Normocytic anemia  Dr. Randa Evens, MD, MPH [facility] [phone] 07/08/2018 1:24 PM

## 2018-07-09 LAB — FOLATE: FOLATE: 10 ng/mL (ref >5.9)

## 2018-07-09 LAB — FERRITIN: Ferritin: 186 ng/mL (ref 11–307)

## 2018-07-09 LAB — CBC
HCT: 22.9 % — ABNORMAL LOW (ref 35.0–47.0)
Hemoglobin: 7.8 g/dL — ABNORMAL LOW (ref 12.0–16.0)
MCH: 30.7 pg (ref 26.0–34.0)
MCHC: 33.9 g/dL (ref 32.0–36.0)
MCV: 90.4 fL (ref 80.0–100.0)
Platelets: 296 10*3/uL (ref 150–440)
RBC: 2.54 MIL/uL — ABNORMAL LOW (ref 3.80–5.20)
RDW: 18.3 % — AB (ref 11.5–14.5)
WBC: 8 10*3/uL (ref 3.6–11.0)

## 2018-07-09 LAB — IRON AND TIBC
Iron: 22 ug/dL — ABNORMAL LOW (ref 28–170)
Saturation Ratios: 13 % (ref 10.4–31.8)
TIBC: 175 ug/dL — AB (ref 250–450)
UIBC: 153 ug/dL

## 2018-07-09 LAB — VITAMIN B12: Vitamin B-12: 732 pg/mL (ref 180–914)

## 2018-07-09 MED ORDER — ENSURE ENLIVE PO LIQD
237.0000 mL | Freq: Two times a day (BID) | ORAL | Status: DC
  Administered 2018-07-09 – 2018-07-10 (×3): 237 mL via ORAL

## 2018-07-09 NOTE — Progress Notes (Signed)
[facility] [place] NAME: Brenda Parker    MR#:  [id number]  DATE OF BIRTH:  1935-12-03  SUBJECTIVE:   No issues overnight  Wondering when pulmonary will see her tomorrow  REVIEW OF SYSTEMS:    Review of Systems  Constitutional: Negative for fever, chills weight loss HENT: Negative for ear pain, nosebleeds, congestion, facial swelling, rhinorrhea, neck pain, neck stiffness and ear discharge.   Respiratory: Negative for cough, shortness of breath, wheezing  Cardiovascular: Negative for chest pain, palpitations and leg swelling.  Gastrointestinal: Negative for heartburn, abdominal pain, vomiting, diarrhea or consitpation Genitourinary: Negative for dysuria, urgency, frequency, hematuria Musculoskeletal: Negative for back pain or joint pain Neurological: Negative for dizziness, seizures, syncope, focal weakness,  numbness and headaches.  Hematological: Does not bruise/bleed easily.  Psychiatric/Behavioral: Negative for hallucinations, confusion, dysphoric mood    Tolerating Diet: yes      DRUG ALLERGIES:   Allergies  Allergen Reactions  . Penicillins Rash    Patient had a rash after an injection in 1973.  Unsure if taken orally will cause any reaction  Has patient had a PCN reaction causing immediate rash, facial/tongue/throat swelling, SOB or lightheadedness with hypotension: Yes Has patient had a PCN reaction causing severe rash involving mucus membranes or skin necrosis: No Has patient had a PCN reaction that required hospitalization: No Has patient had a PCN reaction occurring within the last 10 years: No     VITALS:  Blood pressure 110/62, pulse 87, temperature 97.6 F (36.4 C), temperature source Oral, resp. rate 18, height 5\' 4"  (1.626 m), weight 64.9 kg, SpO2 97 %.  PHYSICAL EXAMINATION:  Constitutional: Appears well-developed and well-nourished. No distress. HENT: Normocephalic. Marland Kitchen Oropharynx is clear and moist.  Eyes:  Conjunctivae and EOM are normal. PERRLA, no scleral icterus.  Neck: Normal ROM. Neck supple. No JVD. No tracheal deviation. CVS: RRR, S1/S2 +, no murmurs, no gallops, no carotid bruit.  Pulmonary: Effort and breath sounds normal, no stridor, rhonchi, wheezes, rales.  Abdominal: Soft. BS +,  no distension, tenderness, rebound or guarding.  Musculoskeletal: Normal range of motion. No edema and no tenderness.  Neuro: Alert. CN 2-12 grossly intact. No focal deficits. Skin: Skin is warm and dry. No rash noted. Psychiatric: Normal mood and affect.      LABORATORY PANEL:   CBC Recent Labs  Lab 07/09/18 0633  WBC 8.0  HGB 7.8*  HCT 22.9*  PLT 296   ------------------------------------------------------------------------------------------------------------------  Chemistries  Recent Labs  Lab 07/06/18 0941  07/08/18 0530  NA 135  --  137  K 3.8  --  3.6  CL 101  --  105  CO2 25  --  25  GLUCOSE 117*  --  91  BUN 16  --  15  CREATININE 1.10*   < > 1.05*  CALCIUM 8.7*  --  8.1*  AST 55*  --   --   ALT 56*  --   --   ALKPHOS 126  --   --   BILITOT 0.9  --   --    < > = values in this interval not displayed.   ------------------------------------------------------------------------------------------------------------------  Cardiac Enzymes No results for input(s): TROPONINI in the last 168 hours. ------------------------------------------------------------------------------------------------------------------  RADIOLOGY:  Ct Chest W Contrast  Result Date: 07/07/2018 CLINICAL DATA:  Shortness of breath.  Lung cancer. EXAM: CT CHEST WITH CONTRAST TECHNIQUE: Multidetector CT imaging of the chest was performed during intravenous contrast administration. CONTRAST:  53mL OMNIPAQUE  IOHEXOL 300 MG/ML  SOLN COMPARISON:  06/23/2018 FINDINGS: Cardiovascular: The heart size appears normal. Trace pericardial fluid appears similar to previous exam. Aortic atherosclerosis. Calcification in  the LAD and left circumflex and RCA coronary arteries noted. Presumed tumor thrombus is identified within the mid right pulmonary artery, image 54/5.The right ventricular outflow tract is patent. The left pulmonary artery also appears patent Mediastinum/Nodes: No supraclavicular or axillary adenopathy. Left paratracheal adenopathy measures 1.1 cm, image 44/2. Similar to previous exam. Subcarinal lymph node measures 11 mm, image 60/2. Previously 8 mm. The right upper lobe perihilar mass invading the mediastinum is again noted. This measures 4.2 x 2.9 cm, image 58/2. Previously 2.9 x 2.2 cm. There is encasement and narrowing of the right mainstem bronchus, image 59/5. There is also narrowing of the distal SVC proximal to the cavoatrial junction by tumor. Tumor thrombus is identified within and completely occluding the right main pulmonary artery. Lungs/Pleura: No pleural effusion identified. Moderate changes of emphysema. Large thick walled cavitary lung mass with air-fluid level measures 6.1 by 5.3 cm, image 88/3. previously this measured 1.7 by 1.4 cm. Fibrosis and scarring within the right upper lobe and right apex appears similar to previous exam. Within the superior segment of right lower lobe there is a area of ground-glass attenuation and interlobular thickening which measures 6.2 cm. Upper Abdomen: Unchanged cyst within right lobe of liver measuring 4.2 cm. Lateral segment of left lobe of liver is stable measuring 7.2 cm. Unremarkable appearance of the adrenal glands. Musculoskeletal: No chest wall abnormality. No acute or significant osseous findings. IMPRESSION: 1. Thick walled cavitary mass within the right lower lobe demonstrates significant interval increase in size compared with 06/23/2018. Assuming this represents an area of metastatic disease this is worrisome for progression of disease. Necrotizing pneumonia/pulmonary abscess could have a similar appearance. 2. Increase in size of right perihilar lung  mass which invades the mediastinum. The right pulmonary artery is completely occluded and filled with tumor thrombus. There is encasement and narrowing of the right mainstem bronchus. Mass effect and narrowing of the distal SVC is present. 3. Increase in size of enlarged subcarinal lymph node. 4. Regional area of ground-glass attenuation and interlobular septal thickening is noted within the superior segment of right lower lobe. Although nonspecific this may represent an area of pneumonitis. Lymphangitic spread of tumor may also have a similar appearance. 5. Aortic Atherosclerosis (ICD10-I70.0) and Emphysema (ICD10-J43.9). 6. Coronary artery atherosclerotic calcifications. Electronically Signed   By: Kerby Moors M.D.   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  1.  Right cavitary lung lesion: Continue IV Meropenem ( PCN ALLERGY) Pulmonary evaluation for bronchoscopy   I will call Dr. Juanell Fairly tomorrow  2.  Right upper lobe squamous cell stage III lung cancer: Oncology evaluation initiated  3.  Acute on chronic  normocytic anemia of chronic disease: Due to bone marrow suppression from infection Hemoglobin 7.8 this morning baseline 8-9  No blood transfusion today   4.  COPD without signs of exacerbation  5.  Chronic kidney disease stage III creatinine is at baseline      Management plans discussed with the patient and she is in agreement.  CODE STATUS: DNR  TOTAL TIME TAKING CARE OF THIS PATIENT: 22 minutes.     POSSIBLE D/C 2 to 3 days, DEPENDING  ON CLINICAL CONDITION.   Uilani Sanville M.D on 07/09/2018 at 11:02 AM  Between 7am to 6pm - Pager - [phone] After 6pm go to [email or web address] - password [email or web address] [facility]  Office  [phone]  CC: Primary care physician; Leonel Ramsay, MD  Note: This dictation was prepared with Dragon dictation along with smaller phrase technology. Any transcriptional errors that result from this process are unintentional.

## 2018-07-09 NOTE — Progress Notes (Signed)
Initial Nutrition Assessment  DOCUMENTATION CODES:   Non-severe (moderate) malnutrition in context of chronic illness  INTERVENTION:  Provide Ensure Enlive po BID, each supplement provides 350 kcal and 20 grams of protein. Patient prefers strawberry.  Provide Magic cup TID with meals, each supplement provides 290 kcal and 9 grams of protein. Patient prefers mixed berry.  Continue daily MVI.  NUTRITION DIAGNOSIS:   Moderate Malnutrition related to chronic illness(stage III SCC of lung, COPD) as evidenced by mild fat depletion, moderate fat depletion, mild muscle depletion, moderate muscle depletion.  GOAL:   Patient will meet greater than or equal to 90% of their needs  MONITOR:   PO intake, Supplement acceptance, Labs, Weight trends, I & O's  REASON FOR ASSESSMENT:   Malnutrition Screening Tool    ASSESSMENT:   82 year old female with PMHx of breast cancer s/p XRT, COPD, glaucoma, HTN, stage III SCC of lung s/p chemotherapy/XRT on maintenance durvalumab now admitted with right cavitary lung lesion.   -Plan is for pulmonary evaluation for possible bronchoscopy.  Met with patient at bedside. Patient was just assessed by an RD almost 2 weeks ago during recent admission. She reports her appetite remains poor. It has been poor now for the past 2 weeks. This morning for breakfast she had 1/2 of her French toast with some bacon and milk. A barrier to intake at home is that she has been too weak to prepare meals. Family and friends have been bringing her some meals and she also has food in the freezer. She tries to drink 2 Boost per day and 3 Carnation Instant Breakfast per day, but she struggles with oral nutrition supplements. She liked the butter pecan Nepro she had last admission, but reports it is too expensive for her to have at home. She is interested in trying strawberry Ensure and berry Magic Cup.  UBW was 182 lbs and she lost down to around 140 lbs. She reports she has been  weight stable at around 143 lbs lately. Limited weight history in chart, but patient has been weight-stable for the past 4 months.  Medications reviewed and include: MVI daily, NS @ 75 mL/hr, meropenem.  Labs reviewed: Creatinine 1.06, Iron 22, TIBC 175, Ferritin 186, Folate 10.  Discussed with RN.  NUTRITION - FOCUSED PHYSICAL EXAM:    Most Recent Value  Orbital Region  Mild depletion  Upper Arm Region  Moderate depletion  Thoracic and Lumbar Region  Mild depletion  Buccal Region  Mild depletion  Temple Region  Moderate depletion  Clavicle Bone Region  Mild depletion  Clavicle and Acromion Bone Region  Mild depletion  Scapular Bone Region  Mild depletion  Dorsal Hand  Moderate depletion  Patellar Region  Mild depletion  Anterior Thigh Region  Mild depletion  Posterior Calf Region  Moderate depletion  Edema (RD Assessment)  None  Hair  Reviewed  Eyes  Reviewed  Mouth  Reviewed  Skin  Reviewed  Nails  Reviewed     Diet Order:   Diet Order            Diet regular Room service appropriate? Yes; Fluid consistency: Thin  Diet effective now              EDUCATION NEEDS:   Education needs have been addressed  Skin:  Skin Assessment: Reviewed RN Assessment  Last BM:  PTA (07/06/2018 per chart)  Height:   Ht Readings from Last 1 Encounters:  07/07/18 5' 4" (1.626 m)      Weight:   Wt Readings from Last 1 Encounters:  07/07/18 64.9 kg    Ideal Body Weight:  54.5 kg  BMI:  Body mass index is 24.55 kg/m.  Estimated Nutritional Needs:   Kcal:  1500-1700  Protein:  80-95 grams  Fluid:  1.6 L/day  Willey Blade, MS, RD, LDN Office: [phone] Pager: [phone] After Hours/Weekend Pager: [phone]

## 2018-07-10 ENCOUNTER — Inpatient Hospital Stay: Payer: Medicare Other | Admitting: Internal Medicine

## 2018-07-10 ENCOUNTER — Telehealth: Payer: Self-pay | Admitting: Internal Medicine

## 2018-07-10 DIAGNOSIS — E440 Moderate protein-calorie malnutrition: Secondary | ICD-10-CM

## 2018-07-10 DIAGNOSIS — R918 Other nonspecific abnormal finding of lung field: Secondary | ICD-10-CM

## 2018-07-10 DIAGNOSIS — Z923 Personal history of irradiation: Secondary | ICD-10-CM

## 2018-07-10 LAB — PREPARE RBC (CROSSMATCH)

## 2018-07-10 LAB — HEMOGLOBIN: Hemoglobin: 7.8 g/dL — ABNORMAL LOW (ref 12.0–16.0)

## 2018-07-10 LAB — ABO/RH: ABO/RH(D): B POS

## 2018-07-10 MED ORDER — SODIUM CHLORIDE 0.9% IV SOLUTION
250.0000 mL | Freq: Once | INTRAVENOUS | Status: AC
  Administered 2018-07-10: 250 mL via INTRAVENOUS

## 2018-07-10 MED ORDER — ALUM & MAG HYDROXIDE-SIMETH 200-200-20 MG/5ML PO SUSP
15.0000 mL | Freq: Once | ORAL | Status: AC
  Administered 2018-07-10: 15 mL via ORAL
  Filled 2018-07-10: qty 30

## 2018-07-10 MED ORDER — HEPARIN SOD (PORK) LOCK FLUSH 100 UNIT/ML IV SOLN
250.0000 [IU] | INTRAVENOUS | Status: AC | PRN
  Administered 2018-07-11 (×2): 250 [IU]

## 2018-07-10 MED ORDER — HEPARIN SOD (PORK) LOCK FLUSH 100 UNIT/ML IV SOLN: 500.0000 [IU] | Freq: Every day | INTRAVENOUS | Status: DC | PRN

## 2018-07-10 MED ORDER — SODIUM CHLORIDE 0.9% FLUSH: 10.0000 mL | INTRAVENOUS | Status: DC | PRN

## 2018-07-10 MED ORDER — SODIUM CHLORIDE 0.9% FLUSH: 3.0000 mL | INTRAVENOUS | Status: DC | PRN

## 2018-07-10 NOTE — Progress Notes (Signed)
Brenda Parker   DOB:Dec 22, 1935   IF#:[id number]    Subjective: Patient still having shortness of breath especially exertion.  Intermittent cough.  No hemoptysis.  Fatigue.  Appetite is fair at best.  No fevers.  Objective:  Vitals:   07/09/18 1944 07/10/18 0440  BP: 133/60 108/78  Pulse: 89 83  Resp: 16 16  Temp: 98.9 F (37.2 C) 97.6 F (36.4 C)  SpO2: 94% 92%     Intake/Output Summary (Last 24 hours) at 07/10/2018 0836 Last data filed at 07/10/2018 0547 Gross per 24 hour  Intake 2849.44 ml  Output -  Net 2849.44 ml    Physical Exam  Constitutional: She is oriented to person, place, and time and well-developed, well-nourished, and in no distress.  She is alone.  HENT:  Head: Normocephalic and atraumatic.  Mouth/Throat: Oropharynx is clear and moist. No oropharyngeal exudate.  Eyes: Pupils are equal, round, and reactive to light.  Neck: Normal range of motion. Neck supple.  Cardiovascular: Normal rate and regular rhythm.  Pulmonary/Chest: No respiratory distress. She has no wheezes.  Decreased breath sounds right more than left.  Abdominal: Soft. Bowel sounds are normal. She exhibits no distension and no mass. There is no tenderness. There is no rebound and no guarding.  Musculoskeletal: Normal range of motion. She exhibits no edema or tenderness.  Neurological: She is alert and oriented to person, place, and time.  Skin: Skin is warm.  Psychiatric: Affect normal.      Labs:  Lab Results  Component Value Date   WBC 8.0 07/09/2018   HGB 7.8 (L) 07/09/2018   HCT 22.9 (L) 07/09/2018   MCV 90.4 07/09/2018   PLT 296 07/09/2018   NEUTROABS 13.9 (H) 07/06/2018    Lab Results  Component Value Date   NA 137 07/08/2018   K 3.6 07/08/2018   CL 105 07/08/2018   CO2 25 07/08/2018    Studies:  No results found.  Assessment & Plan:   #82 year old female patient history of locally advanced/stage III lung cancer admitted to hospital for worsening fatigue/worsening right  lower lobe cavitary lesion  #Stage III squamous cell lung cancer-status post chemoradiation [finished approximately a month ago]-CT scan shows worsening right lower lobe cavitary lesion [abcess vs progressive malignancy]; and also probable progression of right hilar malignancy.  Patient currently on IV meropenem; await pulmonary evaluation.  #Anemia-hemoglobin 7.8; symptomatic with worsening fatigue.-Multifactorial-malignancy/infection.  Recommend 1 unit of blood.  Discussed pros and cons-including but not limited to risk of infections/transfusion reactions.  Patient agrees.   #Above plan of care was discussed with the patient's son over the phone.  Also discussed with Dr. Modi/ and RN.   [name], MD 07/10/2018  8:36 AM

## 2018-07-10 NOTE — Progress Notes (Signed)
Physical Therapy Treatment Patient Details Name: Brenda Parker MRN: [id number] DOB: [birth year] Today's Date: 07/10/2018    History of Present Illness Patient is an 82 year old female admitted for aspiration pneumonia.  PMH includes squamous cell lung CA st. III, COPD, CKD and breast CA.    PT Comments    Pt is pleasant and willing to participate in PT tx. Pt demonstrates ability to perform ther-ex with minimal cueing for technique. O2 stats remained in the mid 90s on room air with exertion during supine ther-ex. She is safe and independent with all bed mobility. Pt needs RW for UE support in standing. She demonstrates a reciprocal gait pattern with appropriate step length and clearance, but requires VC to maintain RW at a safe distance, making her a fall risk. Pt fatigued with ambulation O2 stats dropped slightly ranging between 88-92, upon sitting in chair and performing pursed lip breathing O2 stats measured 95-97.   Follow Up Recommendations  Home health PT     Equipment Recommendations  None recommended by PT    Recommendations for Other Services       Precautions / Restrictions Precautions Precautions: Fall Restrictions Weight Bearing Restrictions: No    Mobility  Bed Mobility Overal bed mobility: Independent             General bed mobility comments: No assistance needed to get to EOB.  Good sitting balance.  Transfers Overall transfer level: Needs assistance Equipment used: Rolling walker (2 wheeled) Transfers: Sit to/from Stand Sit to Stand: Min guard         General transfer comment: Pt able to rise from EOB to standing with good confidence no direct assist.  Ambulation/Gait Ambulation/Gait assistance: Min guard Gait Distance (Feet): 125 Feet Assistive device: Rolling walker (2 wheeled)       General Gait Details: Pt demonstrates good step length and clearance, with no LOB. Pt lets RW advance too far ahead, can correct with VC.   Stairs              Wheelchair Mobility    Modified Rankin (Stroke Patients Only)       Balance Overall balance assessment: Needs assistance Sitting-balance support: Feet supported Sitting balance-Leahy Scale: Good     Standing balance support: Bilateral upper extremity supported Standing balance-Leahy Scale: Good                              Cognition Arousal/Alertness: Awake/alert Behavior During Therapy: WFL for tasks assessed/performed Overall Cognitive Status: Within Functional Limits for tasks assessed                                 General Comments: Follows commands consistently.      Exercises Other Exercises Other Exercises: Pt performed ankle pumps, quad sets, SLRs, hip ABD/ADD, and heel slides supine with supervision. All ther-ex was performed x 12s with the exception of quad sets and heel slides which were performed x15 reps.    General Comments        Pertinent Vitals/Pain Pain Assessment: No/denies pain    Home Living                      Prior Function            PT Goals (current goals can now be found in the care plan section) Acute Rehab PT  Goals Patient Stated Goal: to return home and resume daily functional. PT Goal Formulation: With patient Time For Goal Achievement: 07/22/18 Potential to Achieve Goals: Good Progress towards PT goals: Progressing toward goals    Frequency    Min 2X/week      PT Plan Discharge plan needs to be updated    Co-evaluation              AM-PAC PT "6 Clicks" Daily Activity  Outcome Measure  Difficulty turning over in bed (including adjusting bedclothes, sheets and blankets)?: None Difficulty moving from lying on back to sitting on the side of the bed? : None Difficulty sitting down on and standing up from a chair with arms (e.g., wheelchair, bedside commode, etc,.)?: Unable Help needed moving to and from a bed to chair (including a wheelchair)?: A Little Help needed  walking in hospital room?: A Little Help needed climbing 3-5 steps with a railing? : A Lot 6 Click Score: 17    End of Session Equipment Utilized During Treatment: Gait belt Activity Tolerance: Patient tolerated treatment well Patient left: in chair;with chair alarm set;with call bell/phone within reach;with family/visitor present Nurse Communication: Mobility status PT Visit Diagnosis: Difficulty in walking, not elsewhere classified (R26.2);Muscle weakness (generalized) (M62.81);History of falling (Z91.81)     Time: 0100-7121 PT Time Calculation (min) (ACUTE ONLY): 24 min  Charges:  $Gait Training: 8-22 mins $Therapeutic Exercise: 8-22 mins                     Algis Downs, SPT    Algis Downs 07/10/2018, 12:01 PM

## 2018-07-10 NOTE — Plan of Care (Signed)
  Problem: Education: Goal: Knowledge of General Education information will improve Description Including pain rating scale, medication(s)/side effects and non-pharmacologic comfort measures Outcome: Progressing   Problem: Health Behavior/Discharge Planning: Goal: Ability to manage health-related needs will improve Outcome: Progressing   Problem: Clinical Measurements: Goal: Ability to maintain clinical measurements within normal limits will improve Outcome: Progressing Goal: Will remain free from infection Outcome: Progressing Goal: Diagnostic test results will improve Outcome: Progressing Goal: Respiratory complications will improve Outcome: Progressing Goal: Cardiovascular complication will be avoided Outcome: Progressing Goal: Will remain free from infection Outcome: Progressing Goal: Cardiovascular complication will be avoided Outcome: Progressing   Problem: Activity: Goal: Risk for activity intolerance will decrease Outcome: Progressing   Problem: Nutrition: Goal: Adequate nutrition will be maintained Outcome: Progressing Goal: Adequate nutrition will be maintained Outcome: Progressing   Problem: Coping: Goal: Level of anxiety will decrease Outcome: Progressing   Problem: Elimination: Goal: Will not experience complications related to bowel motility Outcome: Progressing Goal: Will not experience complications related to urinary retention Outcome: Progressing Goal: Will not experience complications related to urinary retention Outcome: Progressing   Problem: Pain Managment: Goal: General experience of comfort will improve Outcome: Progressing Goal: General experience of comfort will improve Outcome: Progressing   Problem: Safety: Goal: Ability to remain free from injury will improve Outcome: Progressing   Problem: Skin Integrity: Goal: Risk for impaired skin integrity will decrease Outcome: Progressing Goal: Risk for impaired skin integrity will  decrease Outcome: Progressing   Problem: Activity: Goal: Risk for activity intolerance will decrease Outcome: Progressing   Problem: Safety: Goal: Ability to remain free from injury will improve Outcome: Progressing

## 2018-07-10 NOTE — Progress Notes (Signed)
[facility] NAME: Brenda Parker    MR#:  [id number]  DATE OF BIRTH:  09-02-1936  SUBJECTIVE:   Doing well this am   REVIEW OF SYSTEMS:    Review of Systems  Constitutional: Negative for fever, chills weight loss HENT: Negative for ear pain, nosebleeds, congestion, facial swelling, rhinorrhea, neck pain, neck stiffness and ear discharge.   Respiratory: Negative for cough, shortness of breath, wheezing  Cardiovascular: Negative for chest pain, palpitations and leg swelling.  Gastrointestinal: Negative for heartburn, abdominal pain, vomiting, diarrhea or consitpation Genitourinary: Negative for dysuria, urgency, frequency, hematuria Musculoskeletal: Negative for back pain or joint pain Neurological: Negative for dizziness, seizures, syncope, focal weakness,  numbness and headaches.  Hematological: Does not bruise/bleed easily.  Psychiatric/Behavioral: Negative for hallucinations, confusion, dysphoric mood    Tolerating Diet: yes      DRUG ALLERGIES:   Allergies  Allergen Reactions  . Penicillins Rash    Patient had a rash after an injection in 1973.  Unsure if taken orally will cause any reaction  Has patient had a PCN reaction causing immediate rash, facial/tongue/throat swelling, SOB or lightheadedness with hypotension: Yes Has patient had a PCN reaction causing severe rash involving mucus membranes or skin necrosis: No Has patient had a PCN reaction that required hospitalization: No Has patient had a PCN reaction occurring within the last 10 years: No     VITALS:  Blood pressure 108/78, pulse 83, temperature 97.6 F (36.4 C), temperature source Oral, resp. rate 16, height 5\' 4"  (1.626 m), weight 64.9 kg, SpO2 92 %.  PHYSICAL EXAMINATION:  Constitutional: Appears well-developed and well-nourished. No distress. HENT: Normocephalic. Marland Kitchen Oropharynx is clear and moist.  Eyes: Conjunctivae and EOM are normal. PERRLA, no  scleral icterus.  Neck: Normal ROM. Neck supple. No JVD. No tracheal deviation. CVS: RRR, S1/S2 +, no murmurs, no gallops, no carotid bruit.  Pulmonary: Effort and breath sounds normal, no stridor, rhonchi, wheezes, rales.  Abdominal: Soft. BS +,  no distension, tenderness, rebound or guarding.  Musculoskeletal: Normal range of motion. No edema and no tenderness.  Neuro: Alert. CN 2-12 grossly intact. No focal deficits. Skin: Skin is warm and dry. No rash noted. Psychiatric: Normal mood and affect.      LABORATORY PANEL:   CBC Recent Labs  Lab 07/09/18 0633 07/10/18 0928  WBC 8.0  --   HGB 7.8* 7.8*  HCT 22.9*  --   PLT 296  --    ------------------------------------------------------------------------------------------------------------------  Chemistries  Recent Labs  Lab 07/06/18 0941  07/08/18 0530  NA 135  --  137  K 3.8  --  3.6  CL 101  --  105  CO2 25  --  25  GLUCOSE 117*  --  91  BUN 16  --  15  CREATININE 1.10*   < > 1.05*  CALCIUM 8.7*  --  8.1*  AST 55*  --   --   ALT 56*  --   --   ALKPHOS 126  --   --   BILITOT 0.9  --   --    < > = values in this interval not displayed.   ------------------------------------------------------------------------------------------------------------------  Cardiac Enzymes No results for input(s): TROPONINI in the last 168 hours. ------------------------------------------------------------------------------------------------------------------  RADIOLOGY:  No results found.   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  1.  Right cavitary lung lesion: Continue IV Meropenem ( PCN ALLERGY) Pulmonary evaluation for bronchoscopy  Planned for tomorrow ( d/w Dr Juanell Fairly this am)   2.  Right upper lobe squamous cell stage III lung cancer: Oncology evaluation appreciated.  3.   Acute on chronic  normocytic anemia of chronic disease: Due to bone marrow suppression from infection Hemoglobin 7.8 this morning baseline 8-9 Will tx 1 unit per RECS ONCOLOGY.    4.  COPD without signs of exacerbation  5.  Chronic kidney disease stage III creatinine is at baseline      Management plans discussed with the patient and she is in agreement.  CODE STATUS: DNR  TOTAL TIME TAKING CARE OF THIS PATIENT: 21 minutes.     POSSIBLE D/C 2 to 3 days, DEPENDING ON CLINICAL CONDITION.   Ladislav Caselli M.D on 07/10/2018 at 10:46 AM  Between 7am to 6pm - Pager - [phone] After 6pm go to [email or web address] - password [email or web address] [facility]  Office  [phone]  CC: Primary care physician; Leonel Ramsay, MD  Note: This dictation was prepared with Dragon dictation along with smaller phrase technology. Any transcriptional errors that result from this process are unintentional.

## 2018-07-10 NOTE — Progress Notes (Signed)
Pharmacy Antibiotic Note  Brenda Parker is a 82 y.o. female admitted on 07/07/2018 with aspiration pneumonia.  Pharmacy has been consulted for meropenem dosing.  Pt initially ordered Zosyn. Per pt (via RN), pt gets "dots all over" (rash listed in chart) and reaction was in 1972; pt states no trouble breathing. Discussed with MD, changed to meropenem.   Plan: Continue Meropenem 1 g IV q12h based on current renal function.   Height: 5\' 4"  (162.6 cm) Weight: 143 lb (64.9 kg) IBW/kg (Calculated) : 54.7  Temp (24hrs), Avg:98 F (36.7 C), Min:97.4 F (36.3 C), Max:98.9 F (37.2 C)  Recent Labs  Lab 07/06/18 0941 07/07/18 1724 07/08/18 0530 07/09/18 0633  WBC 15.3* 11.9* 9.4 8.0  CREATININE 1.10* 1.08* 1.05*  --     Estimated Creatinine Clearance: 36.9 mL/min (A) (by C-G formula based on SCr of 1.05 mg/dL (H)).    Allergies  Allergen Reactions  . Penicillins Rash    Patient had a rash after an injection in 1973.  Unsure if taken orally will cause any reaction  Has patient had a PCN reaction causing immediate rash, facial/tongue/throat swelling, SOB or lightheadedness with hypotension: Yes Has patient had a PCN reaction causing severe rash involving mucus membranes or skin necrosis: No Has patient had a PCN reaction that required hospitalization: No Has patient had a PCN reaction occurring within the last 10 years: No     Antimicrobials this admission: Meropenem 9/13 >>  Dose adjustments this admission:   Microbiology results:  Thank you for allowing pharmacy to be a part of this patient's care.  Pernell Dupre, PharmD, BCPS Clinical Pharmacist 07/10/2018 2:06 PM

## 2018-07-10 NOTE — Telephone Encounter (Signed)
Reviewed pulmonary recommendations; discussed with the patient's son [name] over the phone.  Recommend palliative care consultation.  Patient's son in agreement.

## 2018-07-10 NOTE — Consult Note (Signed)
Reason for Consult:Query need for bronchoscopy Referring Physician: Mody,S.  Brenda Parker is an 82 y.o. female.  HPI: Brenda Parker is an unfortunate 82 year old former smoker who presented to [facility] on 13 September for evaluation of significant anemia and a cavitary right lung mass. The patient has known non-small cell carcinoma or emphasis squamous cell) diagnosed in April 2019 via bronchoscopy performed by Dr. Marda Stalker. Patient at the time was noted to be stage III. Brenda Parker has undergone chemo radiation and completed this in July 2019. Brenda Parker has been told that Brenda Parker cannot have further chemo due to "her body not being able to tolerate it". Brenda Parker had an episode of Serratia bacteremia on 4 September and was treated with Levaquin. Brenda Parker was having symptoms of cough with purulent sputum production at the time. Brenda Parker states that Brenda Parker proved dramatically after Levaquin. Presently Brenda Parker feels fatigued and debilitated but this has been the issue since April 2019. Brenda Parker does not note any other symptoms. Most recently Brenda Parker has not had any fevers, chills or sweats. Her cough has been controlled. Brenda Parker has had streaky hemoptysis at the time of diagnosis but none since diagnosis. Brenda Parker does not have any purulent sputum production at present. CT scan of the chest was performed to monitor respond to therapy. Brenda Parker was noted to have an enlarging thick-walled cavitary lesion on the right lower lobe and the question is whether this could be representing an infectious process. CT scan was performed on 13 September. I have independently reviewed those films and note the following. The patient has tumor thrombus in the mid-right main pulmonary artery that is almost completely occluding it. Brenda Parker has a) hilar mass invading the mediastinum which is 4.2 x 2.9 cm. There is encasement and narrowing of the right main stem bronchus. There is also narrowing of the distal SVC due to tumor. The cavitary mass in question is thick-walled and measures 6.1 x 5.3 cm.  There is ground glass opacity in the sub segmental right lower lobe that although it could represent pneumonic infiltrate, appears to be, in my interpretation, lymphangitic spread of tumor.  I have discussed the potential complications of a bronchoscopy in this situation. Given the invasion of the main PA with tumor and other findings are consistent with progressing tumor I believe that bronchoscopy is at this point not necessary and actually very risky given the invasion of tumor in the PA. This will make the patient a high risk for conscious sedation and/or general anesthesia. The risks in this situation outweigh the benefits. The patient is very clear on her goals for therapy and Brenda Parker understands the seriousness of her disease. Brenda Parker actually requests that a DURABLE DO NOT RESUSCITATE ORDER is written for her and wishes to pursue palliative care. I think in this situation this is a reasonable goal of care.  Past Medical History:  Diagnosis Date  . Benign liver cyst   . Breast cancer ([place]) 2005   right breast  . COPD (chronic obstructive pulmonary disease) ([place])   . Dyspnea   . Glaucoma   . Hemoptysis 2019  . Hypertension    Not on medication for htn. patient denies this  . Lung mass 01/2018  . Personal history of radiation therapy     Past Surgical History:  Procedure Laterality Date  . BREAST EXCISIONAL BIOPSY Right 2005   positive, radiation  . CHOLECYSTECTOMY  1988  . COLONOSCOPY  2012  . ENDOBRONCHIAL ULTRASOUND N/A 02/21/2018   Procedure: ENDOBRONCHIAL ULTRASOUND;  Surgeon: Laverle Hobby,  MD;  Location: [facility] ORS;  Service: Pulmonary;  Laterality: N/A;  . EYE SURGERY Bilateral 2009,2010   cataract extractions with lens implant  . GALLBLADDER SURGERY Bilateral   . IR FLUORO GUIDE PORT INSERTION RIGHT  03/10/2018  . JOINT REPLACEMENT Left 2008   left hip replacement  . PORTACATH PLACEMENT  03/10/2018  . TONSILLECTOMY  1960    Family History  Problem Relation Age of Onset   . Breast cancer Mother 66  . CAD Father     Social History:  reports that Brenda Parker quit smoking about 12 years ago. Her smoking use included cigarettes. Brenda Parker smoked 1.00 pack per day. Brenda Parker has never used smokeless tobacco. Brenda Parker reports that Brenda Parker drank alcohol. Brenda Parker reports that Brenda Parker does not use drugs. 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 Traveled extensively as Brenda Parker was married to a member of the [employer]. Brenda Parker is widowed. Brenda Parker has a son who is in Event organiser. Son is engaged in his mother's care.   Allergies:  Allergies  Allergen Reactions  . Penicillins Rash    Patient had a rash after an injection in 1973.  Unsure if taken orally will cause any reaction  Has patient had a PCN reaction causing immediate rash, facial/tongue/throat swelling, SOB or lightheadedness with hypotension: Yes Has patient had a PCN reaction causing severe rash involving mucus membranes or skin necrosis: No Has patient had a PCN reaction that required hospitalization: No Has patient had a PCN reaction occurring within the last 10 years: No     Medications: I have reviewed the patient's current medications.  Results for orders placed or performed during the hospital encounter of 07/07/18 (from the past 48 hour(s))  CBC     Status: Abnormal   Collection Time: 07/09/18  6:33 AM  Result Value Ref Range   WBC 8.0 3.6 - 11.0 K/uL   RBC 2.54 (L) 3.80 - 5.20 MIL/uL   Hemoglobin 7.8 (L) 12.0 - 16.0 g/dL   HCT 22.9 (L) 35.0 - 47.0 %   MCV 90.4 80.0 - 100.0 fL   MCH 30.7 26.0 - 34.0 pg   MCHC 33.9 32.0 - 36.0 g/dL   RDW 18.3 (H) 11.5 - 14.5 %   Platelets 296 150 - 440 K/uL    Comment: Performed at [facility], [address] [place], [place] [address]  Ferritin     Status: None   Collection Time: 07/09/18   6:33 AM  Result Value Ref Range   Ferritin 186 11 - 307 ng/mL    Comment: Performed at [facility], [address] [place], [place] [address]  Iron and TIBC     Status: Abnormal   Collection Time: 07/09/18  6:33 AM  Result Value Ref Range   Iron 22 (L) 28 - 170 ug/dL   TIBC 175 (L) 250 - 450 ug/dL   Saturation Ratios 13 10.4 - 31.8 %   UIBC 153 ug/dL    Comment: Performed at [facility], [address] [place], [place] [address]  Vitamin B12     Status: None   Collection Time: 07/09/18  6:33 AM  Result Value Ref Range   Vitamin B-12 732 180 - 914 pg/mL    Comment: (NOTE) This assay is not validated for testing neonatal or myeloproliferative syndrome  specimens for Vitamin B12 levels. Performed at [facility], [place] [address] [place], [place] [address]   Folate     Status: None   Collection Time: 07/09/18  6:33 AM  Result Value Ref Range   Folate 10.0 >5.9 ng/mL    Comment: Performed at [facility], [address] [place], [place] [address]  ABO/Rh     Status: None   Collection Time: 07/09/18  6:33 AM  Result Value Ref Range   ABO/RH(D)      B POS Performed at [facility], [address] [place], [place] [address]   Type and screen [place]     Status: None (Preliminary result)   Collection Time: 07/10/18  9:19 AM  Result Value Ref Range   ABO/RH(D) B POS    Antibody Screen NEG    Sample Expiration 07/13/2018    Unit Number [id number]    Blood Component Type RED CELLS,LR    Unit division 00    Status of Unit ISSUED    Transfusion Status OK TO TRANSFUSE    Crossmatch Result      Compatible Performed at [facility], [address] [place], [place] [address]   Prepare RBC     Status: None   Collection Time: 07/10/18  9:20 AM  Result Value Ref Range   Order Confirmation      ORDER PROCESSED BY BLOOD BANK Performed at [facility], [address] [place], [place]  [address]   Hemoglobin     Status: Abnormal   Collection Time: 07/10/18  9:28 AM  Result Value Ref Range   Hemoglobin 7.8 (L) 12.0 - 16.0 g/dL    Comment: Performed at [facility], [address] [place], [place] [address]    No results found.  Review of Systems  Constitutional: Positive for malaise/fatigue and weight loss. Negative for chills, diaphoresis and fever.  HENT: Negative.   Eyes: Negative.   Respiratory: Positive for shortness of breath. Negative for cough, hemoptysis, sputum production and wheezing.   Cardiovascular: Negative.   Gastrointestinal: Negative.   Genitourinary: Negative.   Musculoskeletal: Negative.   Skin: Negative.   Neurological: Negative.   Endo/Heme/Allergies: Negative.   Psychiatric/Behavioral: Negative.    Blood pressure 129/62, pulse 91, temperature 98.2 F (36.8 C), temperature source Oral, resp. rate 20, height 5\' 4"  (1.626 m), weight 64.9 kg, SpO2 97 %. Physical Exam  Vitals reviewed. Constitutional: Brenda Parker is oriented to person, place, and time. Brenda Parker appears well-developed and well-nourished. No distress.  Alopecia due to chemotherapy. Appears somewhat younger than stated age. Pale.  HENT:  Head: Normocephalic and atraumatic.  Right Ear: External ear normal.  Left Ear: External ear normal.  Nose: Nose normal.  Mouth/Throat: Oropharynx is clear and moist. No oropharyngeal exudate.  Eyes: Pupils are equal, round, and reactive to light. Conjunctivae and EOM are normal. No scleral icterus.  Neck: Normal range of motion. Neck supple. No JVD present. No tracheal deviation present. No thyromegaly present.  Cardiovascular: Normal rate, regular rhythm and intact distal pulses. Exam reveals no gallop.  Murmur heard. Grade 1/6 systolic ejection murmur at the left sternal border with out radiation.  Respiratory: Effort normal. No stridor. No respiratory distress. Brenda Parker has no wheezes. Brenda Parker exhibits no tenderness.  Course breath sounds throughout.  Slightly diminish air entry in the right lower lung zone.  GI: Soft. Bowel sounds are normal. Brenda Parker exhibits no distension. There is no tenderness.  Musculoskeletal: Normal range  of motion. Brenda Parker exhibits no edema, tenderness or deformity.  Lymphadenopathy:    Brenda Parker has no cervical adenopathy.  Neurological: Brenda Parker is alert and oriented to person, place, and time. No cranial nerve deficit. Brenda Parker exhibits normal muscle tone.  Skin: Skin is warm and dry. No rash noted. Brenda Parker is not diaphoretic. There is pallor.  Psychiatric: Brenda Parker has a normal mood and affect. Her behavior is normal. Judgment and thought content normal.      Assessment/Plan:  1) Right lower lobe thick-walled cavitary lesion with other associated findings on CT consistent with progression of non-small cell carcinoma of the lung. At this point, bronchoscopy is highly risky given the involvement of the tumor into the main PA and higher risk for complications arising from conscious sedation and/or general anesthesia. Recommend symptom palliation.  2) Anemia, may be related to late effects of chemotherapy, chronic disease and/or combination of the two. No evidence of active bleeding at present. Patient is getting transfusion per hospitalist care team.  3) COPD with emphysema, no exacerbation at present. Well compensated on current regimen. Continue.  4) DO NOT RESUSCITATE STATUS. Patient wishes to have durable do not resuscitate order for home. Brenda Parker would also like to meet with palliative care. This seems reasonable.   I have discussed all the findings and recommendations with the patient and her son at the bedside. Thank you for allowing [facility] to participate in this patient's care.  Vernard Gambles 07/10/2018, 1:59 PM

## 2018-07-10 NOTE — Care Management Important Message (Signed)
Copy of signed IM left with patient in room.  

## 2018-07-11 ENCOUNTER — Telehealth: Payer: Self-pay | Admitting: Internal Medicine

## 2018-07-11 DIAGNOSIS — Z853 Personal history of malignant neoplasm of breast: Secondary | ICD-10-CM | POA: Diagnosis not present

## 2018-07-11 DIAGNOSIS — I129 Hypertensive chronic kidney disease with stage 1 through stage 4 chronic kidney disease, or unspecified chronic kidney disease: Secondary | ICD-10-CM | POA: Diagnosis not present

## 2018-07-11 DIAGNOSIS — J440 Chronic obstructive pulmonary disease with acute lower respiratory infection: Secondary | ICD-10-CM | POA: Diagnosis not present

## 2018-07-11 DIAGNOSIS — N183 Chronic kidney disease, stage 3 (moderate): Secondary | ICD-10-CM | POA: Diagnosis not present

## 2018-07-11 DIAGNOSIS — J189 Pneumonia, unspecified organism: Secondary | ICD-10-CM | POA: Diagnosis not present

## 2018-07-11 DIAGNOSIS — Z9181 History of falling: Secondary | ICD-10-CM | POA: Diagnosis not present

## 2018-07-11 DIAGNOSIS — C3490 Malignant neoplasm of unspecified part of unspecified bronchus or lung: Secondary | ICD-10-CM | POA: Diagnosis not present

## 2018-07-11 LAB — TYPE AND SCREEN
ABO/RH(D): B POS
ANTIBODY SCREEN: NEGATIVE
Unit division: 0

## 2018-07-11 LAB — BPAM RBC
Blood Product Expiration Date: 201910012359
ISSUE DATE / TIME: 201909161213
UNIT TYPE AND RH: 7300

## 2018-07-11 LAB — HEMOGLOBIN: Hemoglobin: 9.2 g/dL — ABNORMAL LOW (ref 12.0–16.0)

## 2018-07-11 MED ORDER — HEPARIN SOD (PORK) LOCK FLUSH 100 UNIT/ML IV SOLN
INTRAVENOUS | Status: AC
  Administered 2018-07-11: 10:00:00 250 [IU]
  Filled 2018-07-11: qty 5

## 2018-07-11 NOTE — Plan of Care (Signed)
  Problem: Education: Goal: Knowledge of General Education information will improve Description Including pain rating scale, medication(s)/side effects and non-pharmacologic comfort measures Outcome: Progressing   Problem: Health Behavior/Discharge Planning: Goal: Ability to manage health-related needs will improve Outcome: Progressing   Problem: Clinical Measurements: Goal: Ability to maintain clinical measurements within normal limits will improve Outcome: Progressing Goal: Will remain free from infection Outcome: Progressing Goal: Diagnostic test results will improve Outcome: Progressing Goal: Respiratory complications will improve Outcome: Progressing Goal: Cardiovascular complication will be avoided Outcome: Progressing Goal: Will remain free from infection Outcome: Progressing Goal: Cardiovascular complication will be avoided Outcome: Progressing   Problem: Activity: Goal: Risk for activity intolerance will decrease Outcome: Progressing   Problem: Nutrition: Goal: Adequate nutrition will be maintained Outcome: Progressing Goal: Adequate nutrition will be maintained Outcome: Progressing   Problem: Coping: Goal: Level of anxiety will decrease Outcome: Progressing   Problem: Pain Managment: Goal: General experience of comfort will improve Outcome: Progressing Goal: General experience of comfort will improve Outcome: Progressing   Problem: Safety: Goal: Ability to remain free from injury will improve Outcome: Progressing   Problem: Safety: Goal: Ability to remain free from injury will improve Outcome: Progressing

## 2018-07-11 NOTE — Progress Notes (Signed)
Pt D/C to home with son. DNR paper given to pt. All education completed. All questions answered. IV removed intact. Port deaccessed. Pt tolerated well. Pt taken to visitors entrance via wheelchair by NT Christine.

## 2018-07-11 NOTE — Discharge Summary (Signed)
[facility] [place] NAME: Brenda Parker    MR#:  [id number]  DATE OF BIRTH:  1936/03/08  DATE OF ADMISSION:  07/07/2018 ADMITTING PHYSICIAN: Bettey Costa, MD  DATE OF DISCHARGE: October 10, 2018  PRIMARY CARE PHYSICIAN: Leonel Ramsay, MD    ADMISSION DIAGNOSIS:  Pna   DISCHARGE DIAGNOSIS:  Active Problems: Progression of non-small cell carcinoma of lung   SECONDARY DIAGNOSIS:   Past Medical History:  Diagnosis Date  . Benign liver cyst   . Breast cancer ([place]) 2005   right breast  . COPD (chronic obstructive pulmonary disease) ([place])   . Dyspnea   . Glaucoma   . Hemoptysis 2019  . Hypertension    Not on medication for htn. patient denies this  . Lung mass 01/2018  . 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  1. Right cavitary lung lesion: Initial CT was inconclusive for an infectious etiology for the right cavitary lung lesion and therefore she was started on broad-spectrum antibiotics.  Patient was evaluated by pulmonary.  It appears that patient CT findings are consistent with progression of non-small cell lung carcinoma.  Patient is high risk for bronchoscopy given the involvement of the tumor into the main PA.  Recommendations are for symptom palliation.  Patient will have outpatient palliative care follow-up as per her wishes.   2. Right upper lobe squamous cell stage III lung cancer: She will have outpatient palliative care follow-up and can follow-up with oncology as an outpatient PRN.    3.  Acute on chronic normocytic anemia of chronic disease: He has received 1 unit PRBC.  Her hemoglobin is stable  4. COPD without signs of exacerbation  5. Chronic kidney disease stage III creatinine is at baseline   Outpatient palliative  care consultation as per patient's wishes.  She did not want inpatient palliative care consult.  This was discussed with the patient and the son.  DISCHARGE CONDITIONS AND DIET:   Stable for discharge on regular diet  CONSULTS OBTAINED:  Treatment Team:  Erby Pian, MD Sindy Guadeloupe, MD  DRUG ALLERGIES:   Allergies  Allergen Reactions  . Penicillins Rash    Patient had a rash after an injection in 1973.  Unsure if taken orally will cause any reaction  Has patient had a PCN reaction causing immediate rash, facial/tongue/throat swelling, SOB or lightheadedness with hypotension: Yes Has patient had a PCN reaction causing severe rash involving mucus membranes or skin necrosis: No Has patient had a PCN reaction that required hospitalization: No Has patient had a PCN reaction occurring within the last 10 years: No     DISCHARGE MEDICATIONS:   Allergies as of 07/11/2018      Reactions   Penicillins Rash   Patient had a rash after an injection in 1973.  Unsure if taken orally will cause any reaction  Has patient had a PCN reaction causing immediate rash, facial/tongue/throat swelling, SOB or lightheadedness with hypotension: Yes Has patient had a PCN reaction causing severe rash involving mucus membranes or skin necrosis: No Has patient had a PCN reaction that required hospitalization: No Has patient had a PCN reaction occurring within the last 10 years: No      Medication List    STOP taking these medications  levofloxacin 250 MG tablet Commonly known as:  LEVAQUIN     TAKE these medications   Fluticasone-Salmeterol 500-50 MCG/DOSE Aepb Commonly known as:  ADVAIR Inhale 1 puff into the lungs 2 (two) times daily.   latanoprost 0.005 % ophthalmic solution Commonly known as:  XALATAN Place 1 drop into both eyes at bedtime.   lidocaine-prilocaine cream Commonly known as:  EMLA Apply 1 application topically as needed. To port a cath site   multivitamin with  minerals Tabs tablet Take 1 tablet by mouth daily.   oxyCODONE 5 MG immediate release tablet Commonly known as:  Oxy IR/ROXICODONE Take 1 tablet (5 mg total) by mouth 2 (two) times daily as needed for moderate pain or severe pain.   polyethylene glycol packet Commonly known as:  MIRALAX / GLYCOLAX Take 17 g by mouth daily as needed for mild constipation or moderate constipation.   PROVENTIL HFA 108 (90 Base) MCG/ACT inhaler Generic drug:  albuterol Inhale 2 puffs into the lungs every 6 (six) hours as needed for wheezing or shortness of breath.   Vitamin D3 1000 units Caps Take 3,000 Units by mouth daily.         Today   CHIEF COMPLAINT:   Patient doing okay this morning no acute events overnight   VITAL SIGNS:  Blood pressure 119/63, pulse 86, temperature 98.2 F (36.8 C), resp. rate 19, height 5\' 4"  (1.626 m), weight 64.9 kg, SpO2 95 %.   REVIEW OF SYSTEMS:  Review of Systems  Constitutional: Negative.  Negative for chills, fever and malaise/fatigue.  HENT: Negative.  Negative for ear discharge, ear pain, hearing loss, nosebleeds and sore throat.   Eyes: Negative.  Negative for blurred vision and pain.  Respiratory: Negative.  Negative for cough, hemoptysis, shortness of breath and wheezing.   Cardiovascular: Negative.  Negative for chest pain, palpitations and leg swelling.  Gastrointestinal: Negative.  Negative for abdominal pain, blood in stool, diarrhea, nausea and vomiting.  Genitourinary: Negative.  Negative for dysuria.  Musculoskeletal: Negative.  Negative for back pain.  Skin: Negative.   Neurological: Negative for dizziness, tremors, speech change, focal weakness, seizures and headaches.  Endo/Heme/Allergies: Negative.  Does not bruise/bleed easily.  Psychiatric/Behavioral: Negative.  Negative for depression, hallucinations and suicidal ideas.     PHYSICAL EXAMINATION:  GENERAL:  82 y.o.-year-old patient lying in the bed with no acute distress.  NECK:   Supple, no jugular venous distention. No thyroid enlargement, no tenderness.  LUNGS: Normal breath sounds bilaterally, no wheezing, rales,rhonchi  No use of accessory muscles of respiration.  CARDIOVASCULAR: S1, S2 normal. No murmurs, rubs, or gallops.  ABDOMEN: Soft, non-tender, non-distended. Bowel sounds present. No organomegaly or mass.  EXTREMITIES: No pedal edema, cyanosis, or clubbing.  PSYCHIATRIC: The patient is alert and oriented x 3.  SKIN: No obvious rash, lesion, or ulcer.   DATA REVIEW:   CBC Recent Labs  Lab 07/09/18 0633  07/11/18 0706  WBC 8.0  --   --   HGB 7.8*   < > 9.2*  HCT 22.9*  --   --   PLT 296  --   --    < > = values in this interval not displayed.    Chemistries  Recent Labs  Lab 07/06/18 0941  07/08/18 0530  NA 135  --  137  K 3.8  --  3.6  CL 101  --  105  CO2 25  --  25  GLUCOSE 117*  --  91  BUN 16  --  15  CREATININE 1.10*   < > 1.05*  CALCIUM 8.7*  --  8.1*  AST 55*  --   --   ALT 56*  --   --   ALKPHOS 126  --   --   BILITOT 0.9  --   --    < > = values in this interval not displayed.    Cardiac Enzymes No results for input(s): TROPONINI in the last 168 hours.  Microbiology Results  @MICRORSLT48 @  RADIOLOGY:  No results found.    Allergies as of 07/11/2018      Reactions   Penicillins Rash   Patient had a rash after an injection in 1973.  Unsure if taken orally will cause any reaction  Has patient had a PCN reaction causing immediate rash, facial/tongue/throat swelling, SOB or lightheadedness with hypotension: Yes Has patient had a PCN reaction causing severe rash involving mucus membranes or skin necrosis: No Has patient had a PCN reaction that required hospitalization: No Has patient had a PCN reaction occurring within the last 10 years: No      Medication List    STOP taking these medications   levofloxacin 250 MG tablet Commonly known as:  LEVAQUIN     TAKE these medications   Fluticasone-Salmeterol 500-50  MCG/DOSE Aepb Commonly known as:  ADVAIR Inhale 1 puff into the lungs 2 (two) times daily.   latanoprost 0.005 % ophthalmic solution Commonly known as:  XALATAN Place 1 drop into both eyes at bedtime.   lidocaine-prilocaine cream Commonly known as:  EMLA Apply 1 application topically as needed. To port a cath site   multivitamin with minerals Tabs tablet Take 1 tablet by mouth daily.   oxyCODONE 5 MG immediate release tablet Commonly known as:  Oxy IR/ROXICODONE Take 1 tablet (5 mg total) by mouth 2 (two) times daily as needed for moderate pain or severe pain.   polyethylene glycol packet Commonly known as:  MIRALAX / GLYCOLAX Take 17 g by mouth daily as needed for mild constipation or moderate constipation.   PROVENTIL HFA 108 (90 Base) MCG/ACT inhaler Generic drug:  albuterol Inhale 2 puffs into the lungs every 6 (six) hours as needed for wheezing or shortness of breath.   Vitamin D3 1000 units Caps Take 3,000 Units by mouth daily.          Management plans discussed with the patient and she is in agreement. Stable for discharge home with outpatient palliative care  Patient should follow up with PCP  CODE STATUS:     Code Status Orders  (From admission, onward)         Start     Ordered   07/07/18 1701  Do not attempt resuscitation (DNR)  Continuous    Question Answer Comment  In the event of cardiac or respiratory ARREST Do not call a "code blue"   In the event of cardiac or respiratory ARREST Do not perform Intubation, CPR, defibrillation or ACLS   In the event of cardiac or respiratory ARREST Use medication by any route, position, wound care, and other measures to relive pain and suffering. May use oxygen, suction and manual treatment of airway obstruction as needed for comfort.      07/07/18 1700        Code Status History    Date Active Date Inactive Code Status Order ID Comments User Context   07/07/2018 1653 07/07/2018 1700 Full Code [id number]   Bettey Costa, MD Inpatient   06/28/2018 1327 06/30/2018 1639  DNR [id number]  Loletha Grayer, MD Inpatient    Advance Directive Documentation     Most Recent Value  Type of Advance Directive  Healthcare Power of Attorney  Pre-existing out of facility DNR order (yellow form or pink MOST form)  -  "MOST" Form in Place?  -      TOTAL TIME TAKING CARE OF THIS PATIENT: 38 minutes.    Note: This dictation was prepared with Dragon dictation along with smaller phrase technology. Any transcriptional errors that result from this process are unintentional.  Torry Adamczak M.D on 07/11/2018 at 9:23 AM  Between 7am to 6pm - Pager - [phone] After 6pm go to [email or web address] - password [email or web address] [facility]  Office  [phone]  CC: Primary care physician; Leonel Ramsay, MD

## 2018-07-11 NOTE — Telephone Encounter (Signed)
Heather/brooke-please order  patient has a palliative care consultation ASAP.

## 2018-07-12 DIAGNOSIS — I129 Hypertensive chronic kidney disease with stage 1 through stage 4 chronic kidney disease, or unspecified chronic kidney disease: Secondary | ICD-10-CM | POA: Diagnosis not present

## 2018-07-12 DIAGNOSIS — Z9181 History of falling: Secondary | ICD-10-CM | POA: Diagnosis not present

## 2018-07-12 DIAGNOSIS — N183 Chronic kidney disease, stage 3 (moderate): Secondary | ICD-10-CM | POA: Diagnosis not present

## 2018-07-12 DIAGNOSIS — J440 Chronic obstructive pulmonary disease with acute lower respiratory infection: Secondary | ICD-10-CM | POA: Diagnosis not present

## 2018-07-12 DIAGNOSIS — J189 Pneumonia, unspecified organism: Secondary | ICD-10-CM | POA: Diagnosis not present

## 2018-07-12 DIAGNOSIS — C3490 Malignant neoplasm of unspecified part of unspecified bronchus or lung: Secondary | ICD-10-CM | POA: Diagnosis not present

## 2018-07-12 NOTE — Telephone Encounter (Signed)
Palliative care referral initiated/faxed

## 2018-07-17 ENCOUNTER — Inpatient Hospital Stay (HOSPITAL_BASED_OUTPATIENT_CLINIC_OR_DEPARTMENT_OTHER): Payer: Medicare Other | Admitting: Internal Medicine

## 2018-07-17 VITALS — BP 129/79 | HR 86 | Temp 97.6°F | Resp 20

## 2018-07-17 DIAGNOSIS — K5900 Constipation, unspecified: Secondary | ICD-10-CM | POA: Diagnosis not present

## 2018-07-17 DIAGNOSIS — D649 Anemia, unspecified: Secondary | ICD-10-CM

## 2018-07-17 DIAGNOSIS — C3411 Malignant neoplasm of upper lobe, right bronchus or lung: Secondary | ICD-10-CM

## 2018-07-17 DIAGNOSIS — J440 Chronic obstructive pulmonary disease with acute lower respiratory infection: Secondary | ICD-10-CM

## 2018-07-17 DIAGNOSIS — Z79899 Other long term (current) drug therapy: Secondary | ICD-10-CM

## 2018-07-17 DIAGNOSIS — Z9221 Personal history of antineoplastic chemotherapy: Secondary | ICD-10-CM | POA: Diagnosis not present

## 2018-07-17 DIAGNOSIS — M7989 Other specified soft tissue disorders: Secondary | ICD-10-CM | POA: Diagnosis not present

## 2018-07-17 DIAGNOSIS — Z87891 Personal history of nicotine dependence: Secondary | ICD-10-CM | POA: Diagnosis not present

## 2018-07-17 DIAGNOSIS — Z853 Personal history of malignant neoplasm of breast: Secondary | ICD-10-CM | POA: Diagnosis not present

## 2018-07-17 DIAGNOSIS — H409 Unspecified glaucoma: Secondary | ICD-10-CM | POA: Diagnosis not present

## 2018-07-17 DIAGNOSIS — Z8701 Personal history of pneumonia (recurrent): Secondary | ICD-10-CM | POA: Diagnosis not present

## 2018-07-17 DIAGNOSIS — Z923 Personal history of irradiation: Secondary | ICD-10-CM | POA: Diagnosis not present

## 2018-07-17 DIAGNOSIS — I129 Hypertensive chronic kidney disease with stage 1 through stage 4 chronic kidney disease, or unspecified chronic kidney disease: Secondary | ICD-10-CM | POA: Diagnosis not present

## 2018-07-17 DIAGNOSIS — N189 Chronic kidney disease, unspecified: Secondary | ICD-10-CM | POA: Diagnosis not present

## 2018-07-17 MED ORDER — FLUTICASONE-SALMETEROL 500-50 MCG/DOSE IN AEPB: 1.0000 | INHALATION_SPRAY | Freq: Two times a day (BID) | RESPIRATORY_TRACT | 3 refills | Status: DC

## 2018-07-17 NOTE — Assessment & Plan Note (Addendum)
#   Right upper lobe lung cancer-squamous cell-stage III/ unresectable; on carboplatin Taxol with radiation; currently status post chemo-radiation July 18th.   #Unfortunately September right lower lobe cavitary lesion 2019-CT scan shows progressive cavitary mass; also right hilar progressive consolidation involving pulmonary artery.  Post pulmonary evaluation the hospital-suggestive of progression less likely infection.  # Patient has been evaluated by palliative care at home; recommended hospice.  However patient wants to hold off for hospice for now.  She is agreeable to hospice if clinical situation deteriorate.  #A long discussion the patient and her son regarding the course of her disease/natural history of lung cancer; expect slow clinical deterioration over time.  Patient's life expectancy is in the order of months.  Patient will reach out to palliative care/hospice if clinical deterioration occurs.  Follow up as needed/however patient can call us for appointments.  # 25 minutes face-to-face with the patient discussing the above plan of care; more than 50% of time spent on prognosis/ natural history; counseling and coordination.   # I reviewed the blood work- with the patient in detail; also reviewed the imaging independently [as summarized above]; and with the patient in detail.   Cc; Dr.Johnston.

## 2018-07-17 NOTE — Progress Notes (Signed)
[facility] OFFICE PROGRESS NOTE  Patient Care Team: Leonel Ramsay, MD as PCP - General (Infectious Diseases) Telford Nab, RN as Registered Nurse [name], MD as Medical Oncologist (Medical Oncology)  Cancer Staging No matching staging information was found for the patient.   Oncology History   # RUL LUNG CANCER-non-small cell; favor squamous cell; PET scan T4N0 [right upper lobe mass involving[mediastinal/blood results] vs small pleural effusion [?M1]   # Hx of Right breast ca [s/p lumpec; RT; No chemo; "pill" x5 years]  # COPD/Hx of smoking  MOLECULAR TESTING/OMNISEQ: PDL-1 25% [TPS; TMB-H; No targets**]  --------------------------------------------------    DIAGNOSIS: [ April 2019]SQUAMOUS CELL LUNG CA  STAGE: III ;GOALS: CURATIVE  CURRENT/MOST RECENT THERAPY [ May 2019]- Carbo-taxol with RT .       Primary cancer of right upper lobe of lung ([place])   03/06/2018 -  Chemotherapy    The patient had palonosetron (ALOXI) injection 0.25 mg, 0.25 mg, Intravenous,  Once, 9 of 9 cycles Administration: 0.25 mg (03/13/2018), 0.25 mg (04/10/2018), 0.25 mg (04/17/2018), 0.25 mg (03/21/2018), 0.25 mg (04/25/2018), 0.25 mg (03/27/2018), 0.25 mg (04/03/2018), 0.25 mg (05/01/2018), 0.25 mg (05/08/2018) CARBOplatin (PARAPLATIN) 130 mg in sodium chloride 0.9 % 250 mL chemo infusion, 130 mg (100 % of original dose 131.4 mg), Intravenous,  Once, 9 of 9 cycles Dose modification:   (original dose 131.4 mg, Cycle 1) Administration: 130 mg (03/13/2018), 130 mg (04/10/2018), 130 mg (04/17/2018), 130 mg (03/21/2018), 130 mg (04/25/2018), 130 mg (03/27/2018), 130 mg (04/03/2018), 130 mg (05/01/2018), 130 mg (05/08/2018) PACLitaxel (TAXOL) 78 mg in sodium chloride 0.9 % 250 mL chemo infusion (</= 62m/m2), 45 mg/m2 = 78 mg, Intravenous,  Once, 9 of 9 cycles Administration: 78 mg (03/13/2018), 78 mg (04/10/2018), 78 mg (04/17/2018), 78 mg (03/21/2018), 78 mg (04/25/2018), 78 mg (03/27/2018), 78 mg  (04/03/2018), 78 mg (05/01/2018), 78 mg (05/08/2018)  for chemotherapy treatment.        INTERVAL HISTORY: Patient is a vague historian/patient's son along with the patient  Brenda [id number].  female pleasant patient above history of stage III lung cancer status post chemoradiation-is here for follow-up.  Patient was recently admitted to hospital twice-given the concerns for progression of lung cancer versus infection.  Patient was evaluated with pulmonary; treated with IV antibiotics.   Currently she feels improved.  Continues to have mild shortness of breath mild fatigue.  Positive for weight loss.  She currently has home health at home.  Review of Systems  Constitutional: Positive for malaise/fatigue and weight loss. Negative for chills, diaphoresis and fever.  HENT: Negative for nosebleeds and sore throat.   Eyes: Negative for double vision.  Respiratory: Positive for cough and shortness of breath. Negative for hemoptysis, sputum production and wheezing.   Cardiovascular: Negative for chest pain, palpitations and orthopnea.  Gastrointestinal: Negative for abdominal pain, blood in stool, heartburn, melena, nausea and vomiting.  Genitourinary: Negative for dysuria, frequency and urgency.  Musculoskeletal: Negative for back pain and joint pain.  Skin: Negative.  Negative for itching and rash.  Neurological: Negative for tingling, focal weakness, weakness and headaches.  Endo/Heme/Allergies: Does not bruise/bleed easily.  Psychiatric/Behavioral: Negative for depression. The patient is not nervous/anxious and does not have insomnia.       PAST MEDICAL HISTORY :  Past Medical History:  Diagnosis Date  . Benign liver cyst   . [place]   right breast  . COPD ([place] obstructive pulmonary disease) ([place]   .  Dyspnea   . Glaucoma   . Hemoptysis 2019  . Hypertension    Not on medication for htn. patient denies this  . Lung mass 01/2018  . Personal history of  radiation therapy     PAST SURGICAL HISTORY :   Past Surgical History:  Procedure Laterality Date  . BREAST EXCISIONAL BIOPSY Right 2005   positive, radiation  . CHOLECYSTECTOMY  1988  . COLONOSCOPY  2012  . ENDOBRONCHIAL ULTRASOUND N/A 02/21/2018   Procedure: ENDOBRONCHIAL ULTRASOUND;  Surgeon: Laverle Hobby, MD;  Location: [facility] ORS;  Service: Pulmonary;  Laterality: N/A;  . EYE SURGERY Bilateral 2009,2010   cataract extractions with lens implant  . GALLBLADDER SURGERY Bilateral   . IR FLUORO GUIDE PORT INSERTION RIGHT  03/10/2018  . JOINT REPLACEMENT Left 2008   left hip replacement  . PORTACATH PLACEMENT  03/10/2018  . TONSILLECTOMY  1960    FAMILY HISTORY :   Family History  Problem Relation Age of Onset  . Breast cancer Mother 103  . CAD Father     SOCIAL HISTORY:   Social History   Tobacco Use  . Smoking status: Former Smoker    Packs/day: 1.00    Types: Cigarettes    Last attempt to quit: 10/25/2005    Years since quitting: 12.7  . Smokeless tobacco: Never Used  Substance Use Topics  . Alcohol use: Not Currently    Comment: occasional glass of beer  . Drug use: Never    ALLERGIES:  is allergic to penicillins.  MEDICATIONS:  Current Outpatient Medications  Medication Sig Dispense Refill  . albuterol (PROVENTIL HFA) 108 (90 Base) MCG/ACT inhaler Inhale 2 puffs into the lungs every 6 (six) hours as needed for wheezing or shortness of breath.     . Cholecalciferol (VITAMIN D3) 1000 units CAPS Take 3,000 Units by mouth daily.     . Fluticasone-Salmeterol (ADVAIR DISKUS) 500-50 MCG/DOSE AEPB Inhale 1 puff into the lungs 2 (two) times daily. 3 each 3  . latanoprost (XALATAN) 0.005 % ophthalmic solution Place 1 drop into both eyes at bedtime.    . lidocaine-prilocaine (EMLA) cream Apply 1 application topically as needed. To port a cath site 30 g 6  . Multiple Vitamin (MULTIVITAMIN WITH MINERALS) TABS tablet Take 1 tablet by mouth daily. 30 tablet 0  .  oxyCODONE (OXY IR/ROXICODONE) 5 MG immediate release tablet Take 1 tablet (5 mg total) by mouth 2 (two) times daily as needed for moderate pain or severe pain. 15 tablet 0  . polyethylene glycol (MIRALAX / GLYCOLAX) packet Take 17 g by mouth daily as needed for mild constipation or moderate constipation.     No current facility-administered medications for this visit.     PHYSICAL EXAMINATION: ECOG PERFORMANCE STATUS: 1 - Symptomatic but completely ambulatory  BP 129/79   Pulse 86   Temp 97.6 F (36.4 C) (Tympanic)   Resp 20   There were no vitals filed for this visit.  Physical Exam  Constitutional: She is oriented to person, place, and time.  Frail-appearing Caucasian female patient..  She is in a wheelchair.  Accompanied by son.  HENT:  Head: Normocephalic and atraumatic.  Mouth/Throat: Oropharynx is clear and moist. No oropharyngeal exudate.  Eyes: Pupils are equal, round, and reactive to light.  Neck: Normal range of motion. Neck supple.  Cardiovascular: Normal rate and regular rhythm.  Pulmonary/Chest: No respiratory distress. She has no wheezes.  Decreased breath sounds in the right compared to the left.  Abdominal: Soft.  Bowel sounds are normal. She exhibits no distension and no mass. There is no tenderness. There is no rebound and no guarding.  Musculoskeletal: Normal range of motion. She exhibits no edema or tenderness.  Neurological: She is alert and oriented to person, place, and time.  Skin: Skin is warm.  Psychiatric: Affect normal.       LABORATORY DATA:  I have reviewed the data as listed    Component Value Date/Time   NA 137 07/08/2018 0530   K 3.6 07/08/2018 0530   CL 105 07/08/2018 0530   CO2 25 07/08/2018 0530   GLUCOSE 91 07/08/2018 0530   BUN 15 07/08/2018 0530   CREATININE 1.05 (H) 07/08/2018 0530   CALCIUM 8.1 (L) 07/08/2018 0530   PROT 6.5 07/06/2018 0941   ALBUMIN 2.3 (L) 07/06/2018 0941   AST 55 (H) 07/06/2018 0941   ALT 56 (H)  07/06/2018 0941   ALKPHOS 126 07/06/2018 0941   BILITOT 0.9 07/06/2018 0941   GFRNONAA 49 (L) 07/08/2018 0530   GFRAA 57 (L) 07/08/2018 0530    No results found for: SPEP, UPEP  Lab Results  Component Value Date   WBC 8.0 07/09/2018   NEUTROABS 13.9 (H) 07/06/2018   HGB 9.2 (L) 07/11/2018   HCT 22.9 (L) 07/09/2018   MCV 90.4 07/09/2018   PLT 296 07/09/2018      Chemistry      Component Value Date/Time   NA 137 07/08/2018 0530   K 3.6 07/08/2018 0530   CL 105 07/08/2018 0530   CO2 25 07/08/2018 0530   BUN 15 07/08/2018 0530   CREATININE 1.05 (H) 07/08/2018 0530      Component Value Date/Time   CALCIUM 8.1 (L) 07/08/2018 0530   ALKPHOS 126 07/06/2018 0941   AST 55 (H) 07/06/2018 0941   ALT 56 (H) 07/06/2018 0941   BILITOT 0.9 07/06/2018 0941       RADIOGRAPHIC STUDIES: I have personally reviewed the radiological images as listed and agreed with the findings in the report. No results found.   ASSESSMENT & PLAN:  Primary cancer of right upper lobe of lung ([place]) # Right upper lobe lung cancer-squamous cell-stage III/ unresectable; on carboplatin Taxol with radiation; currently status post chemo-radiation July 18th.   #Unfortunately September right lower lobe cavitary lesion 2019-CT scan shows progressive cavitary mass; also right hilar progressive consolidation involving pulmonary artery.  Post pulmonary evaluation the hospital-suggestive of progression less likely infection.  # Patient has been evaluated by palliative care at home; recommended hospice.  However patient wants to hold off for hospice for now.  She is agreeable to hospice if clinical situation deteriorate.  #A long discussion the patient and her son regarding the course of her disease/natural history of lung cancer; expect slow clinical deterioration over time.  Patient's life expectancy is in the order of months.  Patient will reach out to palliative care/hospice if clinical deterioration  occurs.  Follow up as needed/however patient can call us for appointments.  # 25 minutes face-to-face with the patient discussing the above plan of care; more than 50% of time spent on prognosis/ natural history; counseling and coordination.   # I reviewed the blood work- with the patient in detail; also reviewed the imaging independently [as summarized above]; and with the patient in detail.   Cc; Dr.Johnston.      No orders of the defined types were placed in this encounter.  All questions were answered. The patient knows to call the clinic with any  problems, questions or concerns.      [name], MD 07/17/2018 10:02 PM

## 2018-07-18 DIAGNOSIS — N183 Chronic kidney disease, stage 3 (moderate): Secondary | ICD-10-CM | POA: Diagnosis not present

## 2018-07-18 DIAGNOSIS — Z9181 History of falling: Secondary | ICD-10-CM | POA: Diagnosis not present

## 2018-07-18 DIAGNOSIS — C3490 Malignant neoplasm of unspecified part of unspecified bronchus or lung: Secondary | ICD-10-CM | POA: Diagnosis not present

## 2018-07-18 DIAGNOSIS — J189 Pneumonia, unspecified organism: Secondary | ICD-10-CM | POA: Diagnosis not present

## 2018-07-18 DIAGNOSIS — I129 Hypertensive chronic kidney disease with stage 1 through stage 4 chronic kidney disease, or unspecified chronic kidney disease: Secondary | ICD-10-CM | POA: Diagnosis not present

## 2018-07-18 DIAGNOSIS — J440 Chronic obstructive pulmonary disease with acute lower respiratory infection: Secondary | ICD-10-CM | POA: Diagnosis not present

## 2018-07-20 DIAGNOSIS — J189 Pneumonia, unspecified organism: Secondary | ICD-10-CM | POA: Diagnosis not present

## 2018-07-20 DIAGNOSIS — Z9181 History of falling: Secondary | ICD-10-CM | POA: Diagnosis not present

## 2018-07-20 DIAGNOSIS — J440 Chronic obstructive pulmonary disease with acute lower respiratory infection: Secondary | ICD-10-CM | POA: Diagnosis not present

## 2018-07-20 DIAGNOSIS — N183 Chronic kidney disease, stage 3 (moderate): Secondary | ICD-10-CM | POA: Diagnosis not present

## 2018-07-20 DIAGNOSIS — C3490 Malignant neoplasm of unspecified part of unspecified bronchus or lung: Secondary | ICD-10-CM | POA: Diagnosis not present

## 2018-07-20 DIAGNOSIS — I129 Hypertensive chronic kidney disease with stage 1 through stage 4 chronic kidney disease, or unspecified chronic kidney disease: Secondary | ICD-10-CM | POA: Diagnosis not present

## 2018-07-24 DIAGNOSIS — C3490 Malignant neoplasm of unspecified part of unspecified bronchus or lung: Secondary | ICD-10-CM | POA: Diagnosis not present

## 2018-07-24 DIAGNOSIS — J189 Pneumonia, unspecified organism: Secondary | ICD-10-CM | POA: Diagnosis not present

## 2018-07-24 DIAGNOSIS — C3411 Malignant neoplasm of upper lobe, right bronchus or lung: Secondary | ICD-10-CM | POA: Diagnosis not present

## 2018-07-24 DIAGNOSIS — Z9181 History of falling: Secondary | ICD-10-CM | POA: Diagnosis not present

## 2018-07-24 DIAGNOSIS — N183 Chronic kidney disease, stage 3 (moderate): Secondary | ICD-10-CM | POA: Diagnosis not present

## 2018-07-24 DIAGNOSIS — I129 Hypertensive chronic kidney disease with stage 1 through stage 4 chronic kidney disease, or unspecified chronic kidney disease: Secondary | ICD-10-CM | POA: Diagnosis not present

## 2018-07-24 DIAGNOSIS — Z515 Encounter for palliative care: Secondary | ICD-10-CM | POA: Diagnosis not present

## 2018-07-24 DIAGNOSIS — J440 Chronic obstructive pulmonary disease with acute lower respiratory infection: Secondary | ICD-10-CM | POA: Diagnosis not present

## 2018-07-26 DIAGNOSIS — N183 Chronic kidney disease, stage 3 (moderate): Secondary | ICD-10-CM | POA: Diagnosis not present

## 2018-07-26 DIAGNOSIS — I129 Hypertensive chronic kidney disease with stage 1 through stage 4 chronic kidney disease, or unspecified chronic kidney disease: Secondary | ICD-10-CM | POA: Diagnosis not present

## 2018-07-26 DIAGNOSIS — C3490 Malignant neoplasm of unspecified part of unspecified bronchus or lung: Secondary | ICD-10-CM | POA: Diagnosis not present

## 2018-07-26 DIAGNOSIS — J189 Pneumonia, unspecified organism: Secondary | ICD-10-CM | POA: Diagnosis not present

## 2018-07-26 DIAGNOSIS — J440 Chronic obstructive pulmonary disease with acute lower respiratory infection: Secondary | ICD-10-CM | POA: Diagnosis not present

## 2018-07-26 DIAGNOSIS — Z9181 History of falling: Secondary | ICD-10-CM | POA: Diagnosis not present

## 2018-07-31 ENCOUNTER — Other Ambulatory Visit: Payer: Self-pay | Admitting: *Deleted

## 2018-07-31 DIAGNOSIS — C3490 Malignant neoplasm of unspecified part of unspecified bronchus or lung: Secondary | ICD-10-CM | POA: Diagnosis not present

## 2018-07-31 DIAGNOSIS — Z9181 History of falling: Secondary | ICD-10-CM | POA: Diagnosis not present

## 2018-07-31 DIAGNOSIS — I129 Hypertensive chronic kidney disease with stage 1 through stage 4 chronic kidney disease, or unspecified chronic kidney disease: Secondary | ICD-10-CM | POA: Diagnosis not present

## 2018-07-31 DIAGNOSIS — N183 Chronic kidney disease, stage 3 (moderate): Secondary | ICD-10-CM | POA: Diagnosis not present

## 2018-07-31 DIAGNOSIS — J440 Chronic obstructive pulmonary disease with acute lower respiratory infection: Secondary | ICD-10-CM | POA: Diagnosis not present

## 2018-07-31 DIAGNOSIS — J189 Pneumonia, unspecified organism: Secondary | ICD-10-CM | POA: Diagnosis not present

## 2018-07-31 MED ORDER — OXYCODONE HCL 5 MG PO TABS: 5.0000 mg | ORAL_TABLET | Freq: Two times a day (BID) | ORAL | 0 refills | Status: DC | PRN

## 2018-08-03 DIAGNOSIS — Z9181 History of falling: Secondary | ICD-10-CM | POA: Diagnosis not present

## 2018-08-03 DIAGNOSIS — C3490 Malignant neoplasm of unspecified part of unspecified bronchus or lung: Secondary | ICD-10-CM | POA: Diagnosis not present

## 2018-08-03 DIAGNOSIS — J440 Chronic obstructive pulmonary disease with acute lower respiratory infection: Secondary | ICD-10-CM | POA: Diagnosis not present

## 2018-08-03 DIAGNOSIS — J189 Pneumonia, unspecified organism: Secondary | ICD-10-CM | POA: Diagnosis not present

## 2018-08-03 DIAGNOSIS — N183 Chronic kidney disease, stage 3 (moderate): Secondary | ICD-10-CM | POA: Diagnosis not present

## 2018-08-03 DIAGNOSIS — I129 Hypertensive chronic kidney disease with stage 1 through stage 4 chronic kidney disease, or unspecified chronic kidney disease: Secondary | ICD-10-CM | POA: Diagnosis not present

## 2018-08-09 DIAGNOSIS — Z9181 History of falling: Secondary | ICD-10-CM | POA: Diagnosis not present

## 2018-08-09 DIAGNOSIS — C3490 Malignant neoplasm of unspecified part of unspecified bronchus or lung: Secondary | ICD-10-CM | POA: Diagnosis not present

## 2018-08-09 DIAGNOSIS — I129 Hypertensive chronic kidney disease with stage 1 through stage 4 chronic kidney disease, or unspecified chronic kidney disease: Secondary | ICD-10-CM | POA: Diagnosis not present

## 2018-08-09 DIAGNOSIS — J440 Chronic obstructive pulmonary disease with acute lower respiratory infection: Secondary | ICD-10-CM | POA: Diagnosis not present

## 2018-08-09 DIAGNOSIS — J189 Pneumonia, unspecified organism: Secondary | ICD-10-CM | POA: Diagnosis not present

## 2018-08-09 DIAGNOSIS — N183 Chronic kidney disease, stage 3 (moderate): Secondary | ICD-10-CM | POA: Diagnosis not present

## 2018-08-14 DIAGNOSIS — C3490 Malignant neoplasm of unspecified part of unspecified bronchus or lung: Secondary | ICD-10-CM | POA: Diagnosis not present

## 2018-08-14 DIAGNOSIS — J440 Chronic obstructive pulmonary disease with acute lower respiratory infection: Secondary | ICD-10-CM | POA: Diagnosis not present

## 2018-08-14 DIAGNOSIS — J189 Pneumonia, unspecified organism: Secondary | ICD-10-CM | POA: Diagnosis not present

## 2018-08-14 DIAGNOSIS — Z9181 History of falling: Secondary | ICD-10-CM | POA: Diagnosis not present

## 2018-08-14 DIAGNOSIS — I129 Hypertensive chronic kidney disease with stage 1 through stage 4 chronic kidney disease, or unspecified chronic kidney disease: Secondary | ICD-10-CM | POA: Diagnosis not present

## 2018-08-14 DIAGNOSIS — N183 Chronic kidney disease, stage 3 (moderate): Secondary | ICD-10-CM | POA: Diagnosis not present

## 2018-08-16 DIAGNOSIS — N183 Chronic kidney disease, stage 3 (moderate): Secondary | ICD-10-CM | POA: Diagnosis not present

## 2018-08-16 DIAGNOSIS — J440 Chronic obstructive pulmonary disease with acute lower respiratory infection: Secondary | ICD-10-CM | POA: Diagnosis not present

## 2018-08-16 DIAGNOSIS — I129 Hypertensive chronic kidney disease with stage 1 through stage 4 chronic kidney disease, or unspecified chronic kidney disease: Secondary | ICD-10-CM | POA: Diagnosis not present

## 2018-08-16 DIAGNOSIS — C3490 Malignant neoplasm of unspecified part of unspecified bronchus or lung: Secondary | ICD-10-CM | POA: Diagnosis not present

## 2018-08-16 DIAGNOSIS — J189 Pneumonia, unspecified organism: Secondary | ICD-10-CM | POA: Diagnosis not present

## 2018-08-16 DIAGNOSIS — Z9181 History of falling: Secondary | ICD-10-CM | POA: Diagnosis not present

## 2018-08-22 DIAGNOSIS — J440 Chronic obstructive pulmonary disease with acute lower respiratory infection: Secondary | ICD-10-CM | POA: Diagnosis not present

## 2018-08-22 DIAGNOSIS — C3411 Malignant neoplasm of upper lobe, right bronchus or lung: Secondary | ICD-10-CM | POA: Diagnosis not present

## 2018-08-22 DIAGNOSIS — J189 Pneumonia, unspecified organism: Secondary | ICD-10-CM | POA: Diagnosis not present

## 2018-08-22 DIAGNOSIS — I129 Hypertensive chronic kidney disease with stage 1 through stage 4 chronic kidney disease, or unspecified chronic kidney disease: Secondary | ICD-10-CM | POA: Diagnosis not present

## 2018-08-22 DIAGNOSIS — I10 Essential (primary) hypertension: Secondary | ICD-10-CM | POA: Diagnosis not present

## 2018-08-22 DIAGNOSIS — Z9181 History of falling: Secondary | ICD-10-CM | POA: Diagnosis not present

## 2018-08-22 DIAGNOSIS — E7801 Familial hypercholesterolemia: Secondary | ICD-10-CM | POA: Diagnosis not present

## 2018-08-22 DIAGNOSIS — N183 Chronic kidney disease, stage 3 (moderate): Secondary | ICD-10-CM | POA: Diagnosis not present

## 2018-08-22 DIAGNOSIS — C3490 Malignant neoplasm of unspecified part of unspecified bronchus or lung: Secondary | ICD-10-CM | POA: Diagnosis not present

## 2018-08-29 DIAGNOSIS — C3490 Malignant neoplasm of unspecified part of unspecified bronchus or lung: Secondary | ICD-10-CM | POA: Diagnosis not present

## 2018-08-29 DIAGNOSIS — J440 Chronic obstructive pulmonary disease with acute lower respiratory infection: Secondary | ICD-10-CM | POA: Diagnosis not present

## 2018-08-29 DIAGNOSIS — N183 Chronic kidney disease, stage 3 (moderate): Secondary | ICD-10-CM | POA: Diagnosis not present

## 2018-08-29 DIAGNOSIS — Z9181 History of falling: Secondary | ICD-10-CM | POA: Diagnosis not present

## 2018-08-29 DIAGNOSIS — I129 Hypertensive chronic kidney disease with stage 1 through stage 4 chronic kidney disease, or unspecified chronic kidney disease: Secondary | ICD-10-CM | POA: Diagnosis not present

## 2018-08-29 DIAGNOSIS — J189 Pneumonia, unspecified organism: Secondary | ICD-10-CM | POA: Diagnosis not present

## 2018-09-18 ENCOUNTER — Ambulatory Visit
Admission: RE | Admit: 2018-09-18 | Discharge: 2018-09-18 | Disposition: A | Discharge status: Home / Self Care | Payer: Medicare Other | Source: Ambulatory Visit | Attending: Radiation Oncology | Admitting: Radiation Oncology

## 2018-09-18 ENCOUNTER — Other Ambulatory Visit: Payer: Self-pay | Admitting: *Deleted

## 2018-09-18 ENCOUNTER — Encounter: Payer: Self-pay | Admitting: Radiation Oncology

## 2018-09-18 ENCOUNTER — Other Ambulatory Visit: Payer: Self-pay

## 2018-09-18 VITALS — BP 123/78 | HR 92 | Temp 96.7°F | Resp 18 | Wt 138.4 lb

## 2018-09-18 DIAGNOSIS — Z87891 Personal history of nicotine dependence: Secondary | ICD-10-CM | POA: Insufficient documentation

## 2018-09-18 DIAGNOSIS — C3411 Malignant neoplasm of upper lobe, right bronchus or lung: Secondary | ICD-10-CM | POA: Diagnosis not present

## 2018-09-18 DIAGNOSIS — Z923 Personal history of irradiation: Secondary | ICD-10-CM | POA: Insufficient documentation

## 2018-09-18 DIAGNOSIS — C7801 Secondary malignant neoplasm of right lung: Secondary | ICD-10-CM | POA: Diagnosis not present

## 2018-09-18 DIAGNOSIS — Z9221 Personal history of antineoplastic chemotherapy: Secondary | ICD-10-CM | POA: Insufficient documentation

## 2018-09-18 NOTE — Progress Notes (Signed)
Radiation Oncology Follow up Note  Name: Brenda Parker   Date:   09/18/2018 MRN:  [id number] DOB: Jan 23, 1936    This 82 y.o. female presents to the clinic today for routine follow-up in patient now 4 months out having received.chemoradiation therapy to her right lung for stage III non-small cell lung cancer.  REFERRING PROVIDER: Leonel Ramsay, MD  HPI: patient is a 82 year old female previously treated to her right breast 14 years prior developed a right lung stage III non-small cell lung cancer treated with concurrent chemoradiation..her tumor was a T4 N0 M0 non-small cell lung cancer of the rightmost recent CT scans back in September showed a right lower lobe cavitary lesion and some right hilar progression involving pulmonary artery. She had had had evaluated by palliative care with hospice recognition made although the patient only continues under observation at this time she actually is doing well specifically denies cough hemoptysis chest tightness or any dysphagia. She's under no active treatment.  COMPLICATIONS OF TREATMENT: none  FOLLOW UP COMPLIANCE: keeps appointments   PHYSICAL EXAM:  BP 123/78 (BP Location: Left Arm, Patient Position: Sitting)   Pulse 92   Temp (!) 96.7 F (35.9 C) (Tympanic)   Resp 18   Wt 138 lb 7.2 oz (62.8 kg)   BMI 23.76 kg/m  Well-developed well-nourished patient in NAD. HEENT reveals PERLA, EOMI, discs not visualized.  Oral cavity is clear. No oral mucosal lesions are identified. Neck is clear without evidence of cervical or supraclavicular adenopathy. Lungs are clear to A&P. Cardiac examination is essentially unremarkable with regular rate and rhythm without murmur rub or thrill. Abdomen is benign with no organomegaly or masses noted. Motor sensory and DTR levels are equal and symmetric in the upper and lower extremities. Cranial nerves II through XII are grossly intact. Proprioception is intact. No peripheral adenopathy or edema is identified. No  motor or sensory levels are noted. Crude visual fields are within normal range.  RADIOLOGY RESULTS: CT scans reviewed and compatible with the above-stated findings  PLAN: t the present time I will continue to observe her. She's under no further under treatment of medical oncology.She seems overall stable. I've ordered a CT scan prior to her next visit. Patient is to call with any worsening of symptoms including hemoptysis cough or chest tightness. Patient and son both compress my treatment plan well.  I would like to take this opportunity to thank you for allowing me to participate in the care of your patient.Noreene Filbert, MD

## 2018-10-27 DIAGNOSIS — H40003 Preglaucoma, unspecified, bilateral: Secondary | ICD-10-CM | POA: Diagnosis not present

## 2018-11-14 DIAGNOSIS — N183 Chronic kidney disease, stage 3 (moderate): Secondary | ICD-10-CM | POA: Diagnosis not present

## 2018-11-21 DIAGNOSIS — E7801 Familial hypercholesterolemia: Secondary | ICD-10-CM | POA: Diagnosis not present

## 2018-11-21 DIAGNOSIS — C3411 Malignant neoplasm of upper lobe, right bronchus or lung: Secondary | ICD-10-CM | POA: Diagnosis not present

## 2018-11-21 DIAGNOSIS — N183 Chronic kidney disease, stage 3 (moderate): Secondary | ICD-10-CM | POA: Diagnosis not present

## 2018-11-21 DIAGNOSIS — I10 Essential (primary) hypertension: Secondary | ICD-10-CM | POA: Diagnosis not present

## 2018-11-21 DIAGNOSIS — Z0000 Encounter for general adult medical examination without abnormal findings: Secondary | ICD-10-CM | POA: Diagnosis not present

## 2019-01-11 ENCOUNTER — Ambulatory Visit: Admission: RE | Admit: 2019-01-11 | Payer: Medicare Other | Source: Ambulatory Visit

## 2019-01-15 ENCOUNTER — Telehealth: Payer: Self-pay

## 2019-01-15 NOTE — Telephone Encounter (Signed)
Telephone call to patient to offer palliative care NP visit.  Patient states she "is doing fine" and declines visit at the present time.  Patient states she has not had any hospitalizations.  Patient informed to call with questions or concerns.

## 2019-01-18 ENCOUNTER — Ambulatory Visit: Payer: Medicare Other | Admitting: Radiation Oncology

## 2019-01-24 ENCOUNTER — Other Ambulatory Visit: Payer: Self-pay | Admitting: Internal Medicine

## 2019-01-24 ENCOUNTER — Other Ambulatory Visit: Payer: Self-pay | Admitting: *Deleted

## 2019-01-24 MED ORDER — OXYCODONE HCL 5 MG PO TABS: 5.0000 mg | ORAL_TABLET | Freq: Two times a day (BID) | ORAL | 0 refills | Status: DC | PRN

## 2019-01-31 ENCOUNTER — Telehealth: Payer: Self-pay

## 2019-01-31 NOTE — Telephone Encounter (Signed)
SW contacted patient to schedule visit for this month. Patent declined, stating "I am good". Patient shared that she has no needs at this time. Patient was open and talkative with SW. Patient reports that she is sleeping well once she is able to fall asleep. Patient reports back pain but said this is chronic, and her PCP is aware. Patient has a local son that helps her and a friend from [facility] that comes to assist with errands. Due to COVID-19 crisis, patient verbalized that she is remaining at home, aside from going to grocery store, and washing hands frequently.Patient wants [place] to keep checking in on her for "when she does need [place]". SW notified RN. Patient expressed appreciation for phone call. SW provided education on Palliative Care team and role, provided supportive counseling and used active and reflective listening.

## 2019-02-02 ENCOUNTER — Telehealth: Payer: Self-pay | Admitting: Primary Care

## 2019-02-13 ENCOUNTER — Ambulatory Visit: Payer: Medicare Other

## 2019-02-14 ENCOUNTER — Other Ambulatory Visit: Payer: Self-pay

## 2019-02-14 ENCOUNTER — Ambulatory Visit
Admission: RE | Admit: 2019-02-14 | Discharge: 2019-02-14 | Disposition: A | Discharge status: Home / Self Care | Payer: Medicare Other | Source: Ambulatory Visit | Attending: Radiation Oncology | Admitting: Radiation Oncology

## 2019-02-14 DIAGNOSIS — C3411 Malignant neoplasm of upper lobe, right bronchus or lung: Secondary | ICD-10-CM | POA: Insufficient documentation

## 2019-02-14 DIAGNOSIS — Z923 Personal history of irradiation: Secondary | ICD-10-CM | POA: Diagnosis not present

## 2019-02-14 LAB — POCT I-STAT CREATININE: Creatinine, Ser: 1.2 mg/dL — ABNORMAL HIGH (ref 0.44–1.00)

## 2019-02-14 MED ORDER — IOHEXOL 300 MG/ML  SOLN
60.0000 mL | Freq: Once | INTRAMUSCULAR | Status: AC | PRN
  Administered 2019-02-14: 60 mL via INTRAVENOUS

## 2019-02-19 ENCOUNTER — Other Ambulatory Visit: Payer: Self-pay

## 2019-02-19 ENCOUNTER — Other Ambulatory Visit: Payer: Self-pay | Admitting: *Deleted

## 2019-02-19 ENCOUNTER — Ambulatory Visit
Admission: RE | Admit: 2019-02-19 | Discharge: 2019-02-19 | Disposition: A | Discharge status: Home / Self Care | Payer: Medicare Other | Source: Ambulatory Visit | Attending: Radiation Oncology | Admitting: Radiation Oncology

## 2019-02-19 VITALS — Temp 96.9°F | Wt 152.3 lb

## 2019-02-19 DIAGNOSIS — C3411 Malignant neoplasm of upper lobe, right bronchus or lung: Secondary | ICD-10-CM

## 2019-02-19 DIAGNOSIS — Z87891 Personal history of nicotine dependence: Secondary | ICD-10-CM | POA: Diagnosis not present

## 2019-02-19 NOTE — Progress Notes (Signed)
Radiation Oncology Follow up Note  Name: Brenda Parker   Date:   02/19/2019 MRN:  [id number] DOB: 10/14/1936    This 83 y.o. female presents to the clinic today for 75-month follow-up status post concurrent chemoradiation therapy to her right lung for stage III non-small cell lung cancer.  REFERRING PROVIDER: Leonel Ramsay, MD  HPI: Patient is an 83 year old female now seen at over 8 months having completed concurrent chemoradiation therapy to her right lung for stage III non-small cell lung cancer.  Her tumor was a T4N0 non-small cell lung cancer.  She also on CT scan back in September 2019 showed a right lower lobe cavitary lesion and some right hilar progression involving pulmonary artery.  She had been evaluated by hospice care.  She is currently under no treatment.  She is doing fairly well still has a slight nonproductive cough.  P.o. intake is good..  Interestingly she recently had a CT scan of her chest showing a cavitary thick-walled right lower lobe mass significantly decreased in size favoring resolving chronic cavitary infection.  She had also some small sub-solid right lower lobe pulmonary nodules of indeterminate nature.  The subcarinal lymphadenopathy also had decreased in size.  She has right upper lobe consolidation with associated volume loss favoring post treatment changes.  COMPLICATIONS OF TREATMENT: none  FOLLOW UP COMPLIANCE: keeps appointments   PHYSICAL EXAM:  Temp (!) 96.9 F (36.1 C) (Tympanic)   Wt 152 lb 5.4 oz (69.1 kg)   BMI 26.15 kg/m  Well-developed well-nourished patient in NAD. HEENT reveals PERLA, EOMI, discs not visualized.  Oral cavity is clear. No oral mucosal lesions are identified. Neck is clear without evidence of cervical or supraclavicular adenopathy. Lungs are clear to A&P. Cardiac examination is essentially unremarkable with regular rate and rhythm without murmur rub or thrill. Abdomen is benign with no organomegaly or masses noted. Motor  sensory and DTR levels are equal and symmetric in the upper and lower extremities. Cranial nerves II through XII are grossly intact. Proprioception is intact. No peripheral adenopathy or edema is identified. No motor or sensory levels are noted. Crude visual fields are within normal range.  RADIOLOGY RESULTS: CT scans reviewed and compatible with above-stated findings  PLAN: Present time patient is stable doing well with resolving changes on her CT scan.  At this time I am asked to see her back in 6 months for follow-up with a another CT scan.  Also put her on Dr. B schedule.  I saw the patient along with her son out in her car in the parking lot today to discuss my recommendations and findings with her son.  They all seem to comprehend my treatment plan well.  I would like to take this opportunity to thank you for allowing me to participate in the care of your patient.Noreene Filbert, MD

## 2019-03-02 ENCOUNTER — Telehealth: Payer: Self-pay

## 2019-03-02 NOTE — Telephone Encounter (Signed)
Telephone call to patient to schedule TELEHEALTH visit with patient. Patient declined visit and said "I am doing well". Patient explained that she had a CT scan at the end of April and received positive results. Patient would like to be discharged from [facility] but did express interest in remaining with community palliative and having the NP available. SW notified team.

## 2019-03-06 DIAGNOSIS — M1611 Unilateral primary osteoarthritis, right hip: Secondary | ICD-10-CM | POA: Diagnosis not present

## 2019-03-06 DIAGNOSIS — G8929 Other chronic pain: Secondary | ICD-10-CM | POA: Diagnosis not present

## 2019-03-06 DIAGNOSIS — M25551 Pain in right hip: Secondary | ICD-10-CM | POA: Diagnosis not present

## 2019-03-21 DIAGNOSIS — M25551 Pain in right hip: Secondary | ICD-10-CM | POA: Diagnosis not present

## 2019-03-21 DIAGNOSIS — M1611 Unilateral primary osteoarthritis, right hip: Secondary | ICD-10-CM | POA: Diagnosis not present

## 2019-03-21 DIAGNOSIS — M76891 Other specified enthesopathies of right lower limb, excluding foot: Secondary | ICD-10-CM | POA: Diagnosis not present

## 2019-04-02 DIAGNOSIS — M25551 Pain in right hip: Secondary | ICD-10-CM | POA: Diagnosis not present

## 2019-04-02 DIAGNOSIS — M25651 Stiffness of right hip, not elsewhere classified: Secondary | ICD-10-CM | POA: Diagnosis not present

## 2019-04-10 ENCOUNTER — Other Ambulatory Visit: Payer: Self-pay | Admitting: *Deleted

## 2019-04-10 DIAGNOSIS — M25551 Pain in right hip: Secondary | ICD-10-CM | POA: Diagnosis not present

## 2019-04-17 DIAGNOSIS — M25551 Pain in right hip: Secondary | ICD-10-CM | POA: Diagnosis not present

## 2019-04-17 DIAGNOSIS — M25651 Stiffness of right hip, not elsewhere classified: Secondary | ICD-10-CM | POA: Diagnosis not present

## 2019-04-30 DIAGNOSIS — J439 Emphysema, unspecified: Secondary | ICD-10-CM | POA: Diagnosis not present

## 2019-04-30 DIAGNOSIS — I10 Essential (primary) hypertension: Secondary | ICD-10-CM | POA: Diagnosis not present

## 2019-04-30 DIAGNOSIS — E7801 Familial hypercholesterolemia: Secondary | ICD-10-CM | POA: Diagnosis not present

## 2019-04-30 DIAGNOSIS — Z1231 Encounter for screening mammogram for malignant neoplasm of breast: Secondary | ICD-10-CM | POA: Diagnosis not present

## 2019-04-30 DIAGNOSIS — N183 Chronic kidney disease, stage 3 (moderate): Secondary | ICD-10-CM | POA: Diagnosis not present

## 2019-04-30 DIAGNOSIS — C3411 Malignant neoplasm of upper lobe, right bronchus or lung: Secondary | ICD-10-CM | POA: Diagnosis not present

## 2019-04-30 DIAGNOSIS — M5430 Sciatica, unspecified side: Secondary | ICD-10-CM | POA: Diagnosis not present

## 2019-04-30 DIAGNOSIS — M549 Dorsalgia, unspecified: Secondary | ICD-10-CM | POA: Diagnosis not present

## 2019-05-01 ENCOUNTER — Ambulatory Visit
Admission: RE | Admit: 2019-05-01 | Discharge: 2019-05-01 | Disposition: A | Discharge status: Home / Self Care | Payer: Medicare Other | Source: Ambulatory Visit | Attending: Internal Medicine | Admitting: Internal Medicine

## 2019-05-01 ENCOUNTER — Other Ambulatory Visit: Payer: Self-pay | Admitting: Internal Medicine

## 2019-05-01 DIAGNOSIS — Z1231 Encounter for screening mammogram for malignant neoplasm of breast: Secondary | ICD-10-CM | POA: Insufficient documentation

## 2019-05-07 DIAGNOSIS — H40003 Preglaucoma, unspecified, bilateral: Secondary | ICD-10-CM | POA: Diagnosis not present

## 2019-05-11 DIAGNOSIS — M546 Pain in thoracic spine: Secondary | ICD-10-CM | POA: Diagnosis not present

## 2019-05-11 DIAGNOSIS — M5136 Other intervertebral disc degeneration, lumbar region: Secondary | ICD-10-CM | POA: Diagnosis not present

## 2019-05-11 DIAGNOSIS — M5489 Other dorsalgia: Secondary | ICD-10-CM | POA: Diagnosis not present

## 2019-05-14 NOTE — Telephone Encounter (Signed)
Opened in error

## 2019-05-21 DIAGNOSIS — R531 Weakness: Secondary | ICD-10-CM | POA: Diagnosis not present

## 2019-05-21 DIAGNOSIS — M549 Dorsalgia, unspecified: Secondary | ICD-10-CM | POA: Diagnosis not present

## 2019-05-29 DIAGNOSIS — M25651 Stiffness of right hip, not elsewhere classified: Secondary | ICD-10-CM | POA: Diagnosis not present

## 2019-05-29 DIAGNOSIS — M549 Dorsalgia, unspecified: Secondary | ICD-10-CM | POA: Diagnosis not present

## 2019-05-29 DIAGNOSIS — R531 Weakness: Secondary | ICD-10-CM | POA: Diagnosis not present

## 2019-05-29 DIAGNOSIS — M25551 Pain in right hip: Secondary | ICD-10-CM | POA: Diagnosis not present

## 2019-06-12 DIAGNOSIS — M549 Dorsalgia, unspecified: Secondary | ICD-10-CM | POA: Diagnosis not present

## 2019-06-12 DIAGNOSIS — M25551 Pain in right hip: Secondary | ICD-10-CM | POA: Diagnosis not present

## 2019-06-12 DIAGNOSIS — R531 Weakness: Secondary | ICD-10-CM | POA: Diagnosis not present

## 2019-06-12 DIAGNOSIS — M25651 Stiffness of right hip, not elsewhere classified: Secondary | ICD-10-CM | POA: Diagnosis not present

## 2019-06-13 ENCOUNTER — Telehealth: Payer: Self-pay | Admitting: Primary Care

## 2019-06-13 DIAGNOSIS — M549 Dorsalgia, unspecified: Secondary | ICD-10-CM | POA: Diagnosis not present

## 2019-06-13 DIAGNOSIS — M5136 Other intervertebral disc degeneration, lumbar region: Secondary | ICD-10-CM | POA: Diagnosis not present

## 2019-06-13 NOTE — Telephone Encounter (Signed)
Called x 2 to follow up with palliative care scheduling. Phone rang and then it seemed as if someone hung up without answering. Will try again before d/c.

## 2019-06-15 DIAGNOSIS — C3411 Malignant neoplasm of upper lobe, right bronchus or lung: Secondary | ICD-10-CM | POA: Diagnosis not present

## 2019-06-15 DIAGNOSIS — R0789 Other chest pain: Secondary | ICD-10-CM | POA: Diagnosis not present

## 2019-06-15 IMAGING — MR MR HEAD WO/W CM
13 series · 48 of 48 positions shown · IV contrast (multihance)
Comparison: None.

CLINICAL DATA: Lung cancer staging. Additional history of breast
cancer.

EXAM:
MRI HEAD WITHOUT AND WITH CONTRAST
TECHNIQUE: Multiplanar, multiecho pulse sequences of the brain and surrounding
structures were obtained without and with intravenous contrast.
CONTRAST:  7mL MULTIHANCE GADOBENATE DIMEGLUMINE 529 MG/ML IV SOLN

[Series 2: T1 · sagittal · 5.0mm · 0.45mm/px · 1 of 29 slices shown (1 of 2)]
[im 1/29]
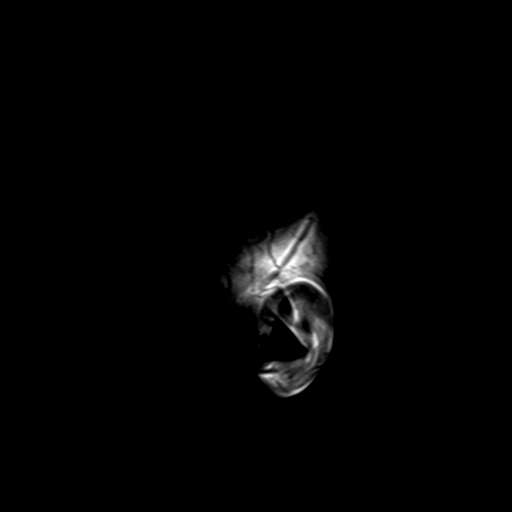

[Series 4: DWI · axial · 3.0mm · 1.80mm/px · z∈[-61,+98]mm · 4 of 55 slices shown (1 of 2)]
[im 1/55]
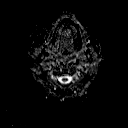
[im 19/55]
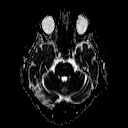
[im 37/55]
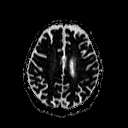
[im 55/55]
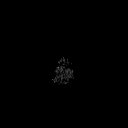

[Series 6: DWI · coronal · 3.0mm · 1.80mm/px · 3 of 48 slices shown (2 of 2)]
[im 1/48]
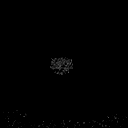
[im 24/48]
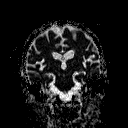
[im 48/48]
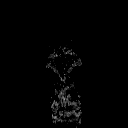

[Series 7: T2 · axial · 5.0mm · 0.60mm/px · z∈[-53,+100]mm · 2 of 25 slices shown (1 of 2)]
[im 1/25]
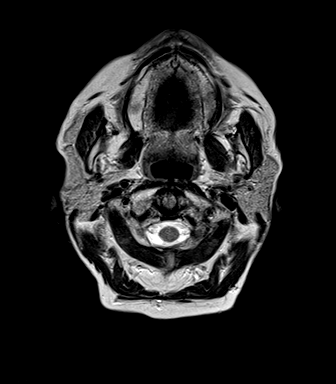
[im 25/25]
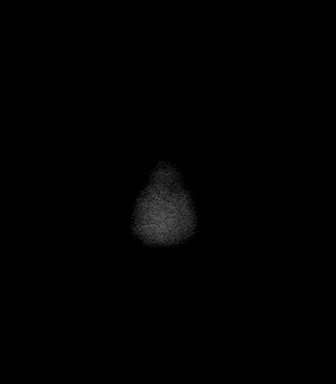

[Series 8: FLAIR · axial · 3.0mm · 0.45mm/px · z∈[-56,+103]mm · 4 of 55 slices shown]
[im 1/55]
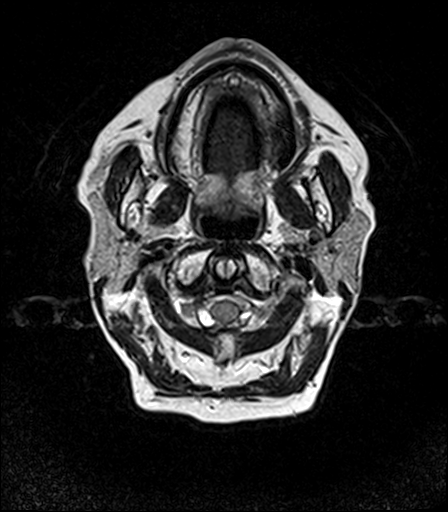
[im 19/55]
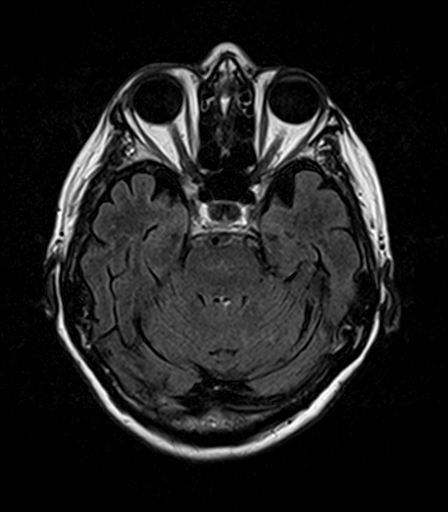
[im 37/55]
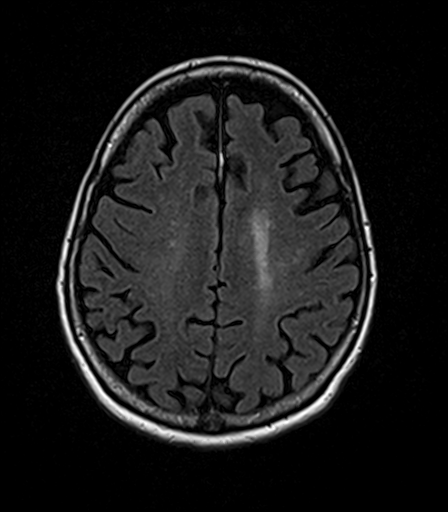
[im 55/55]
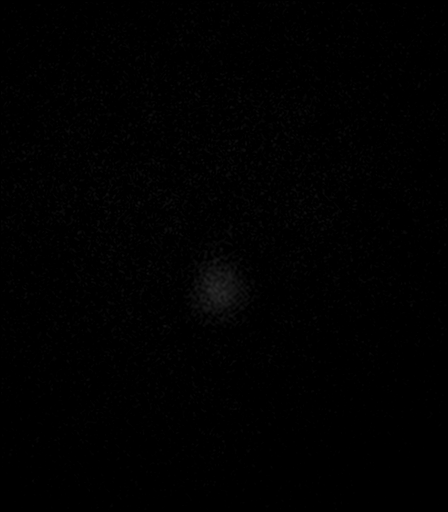

[Series 9: T2 · axial · 5.0mm · 0.45mm/px · z∈[-53,+100]mm · 2 of 25 slices shown (2 of 2)]
[im 1/25]
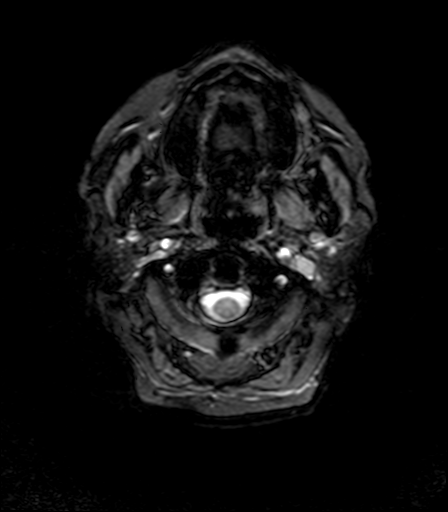
[im 25/25]
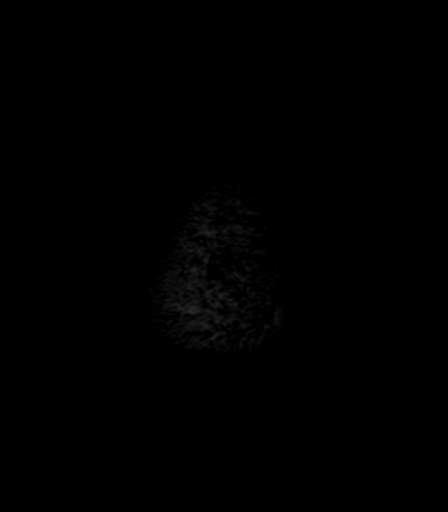

[Series 10: T1 · axial · 1.0mm · 1.00mm/px · z∈[-51,+104]mm · 10 of 160 slices shown (2 of 2)]
[im 1/160]
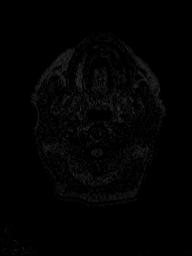
[im 18/160]
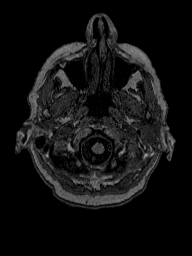
[im 36/160]
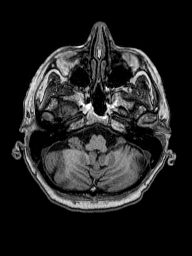
[im 54/160]
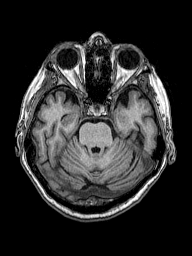
[im 71/160]
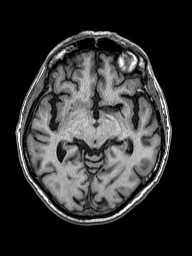
[im 89/160]
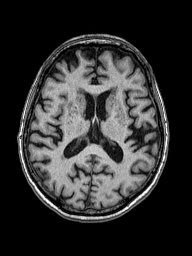
[im 107/160]
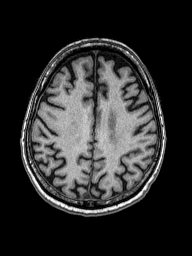
[im 124/160]
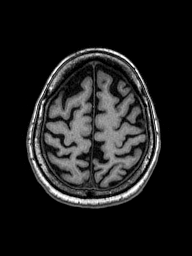
[im 142/160]
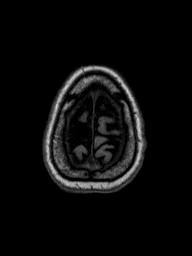
[im 160/160]
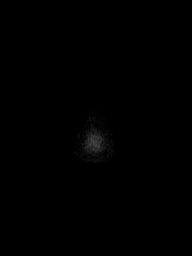

[Series 11: T2 post-contrast · coronal · 5.0mm · 0.49mm/px · 2 of 29 slices shown]
[im 1/29]
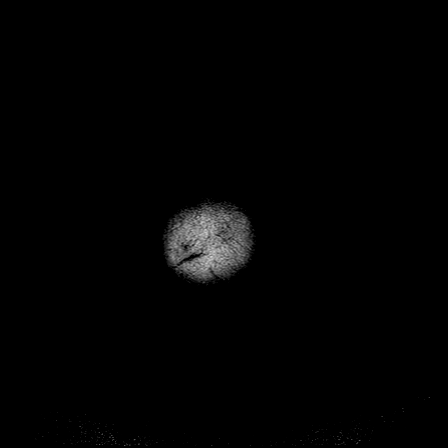
[im 29/29]
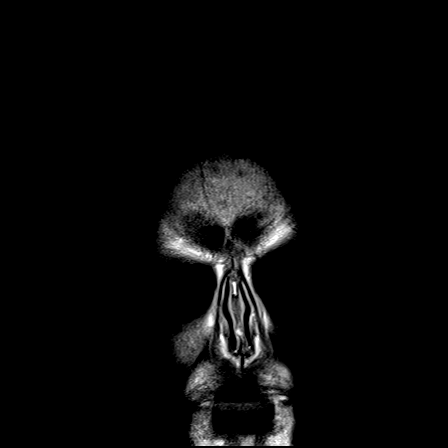

[Series 12: T1 post-contrast · axial · 1.0mm · 1.00mm/px · z∈[-51,+104]mm · 10 of 160 slices shown (1 of 3)]
[im 1/160]
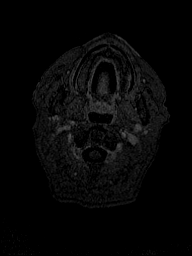
[im 18/160]
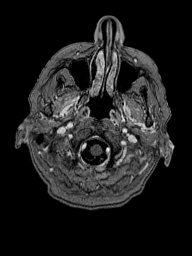
[im 36/160]
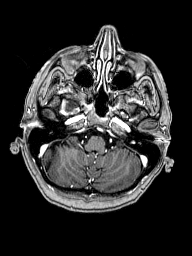
[im 54/160]
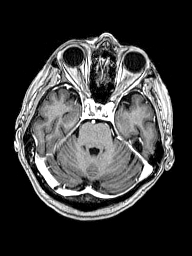
[im 71/160]
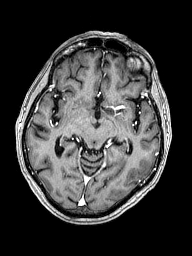
[im 89/160]
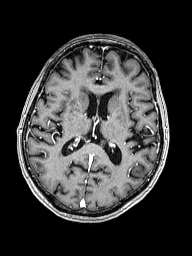
[im 107/160]
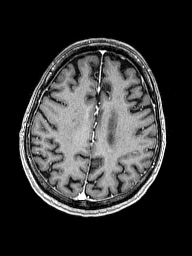
[im 124/160]
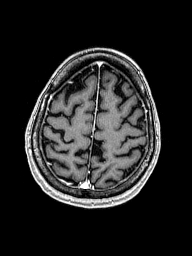
[im 142/160]
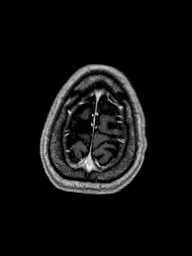
[im 160/160]
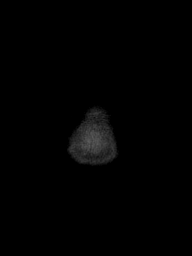

[Series 13: T1 post-contrast · coronal · 5.0mm · 0.43mm/px · 2 of 29 slices shown (2 of 3)]
[im 1/29]
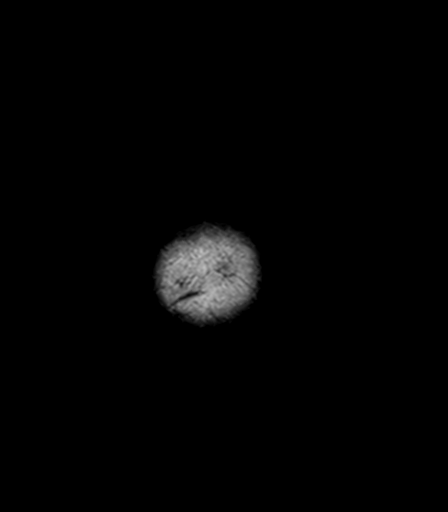
[im 29/29]
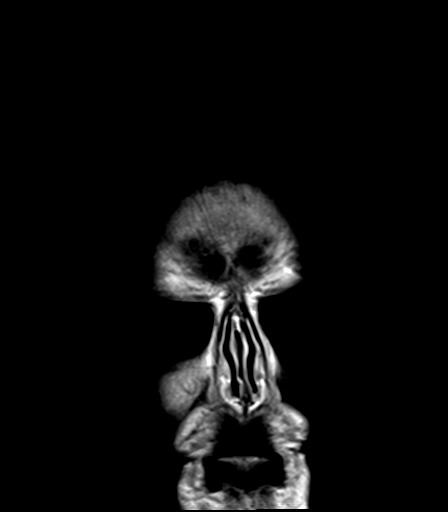

[Series 14: T1 post-contrast · sagittal · 5.0mm · 0.45mm/px · 2 of 29 slices shown (3 of 3)]
[im 1/29]
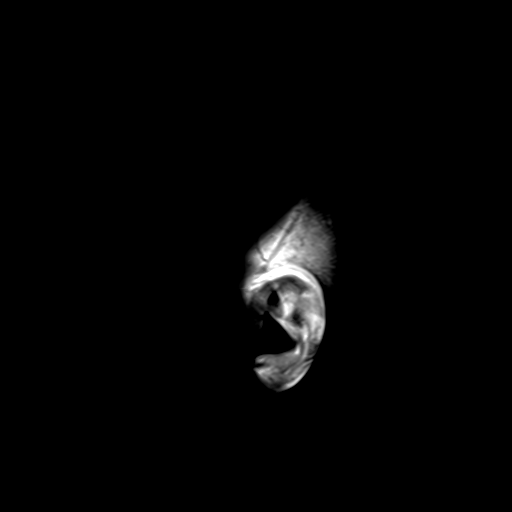
[im 29/29]
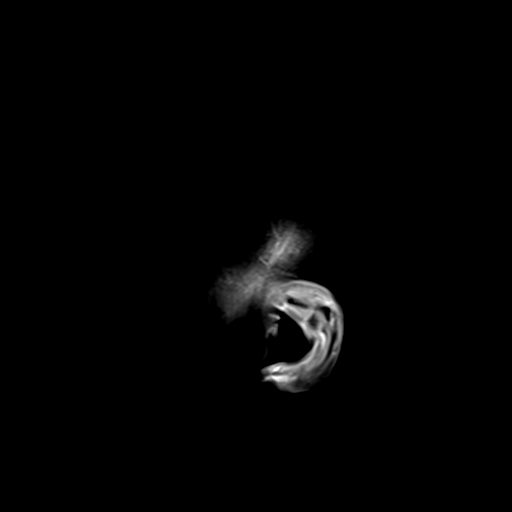

[Series 100: ax (id) · axial · 3.0mm · 1.80mm/px · z∈[-56,+98]mm · 3 of 52 slices shown]
[im 1/52]
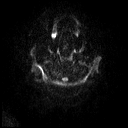
[im 26/52]
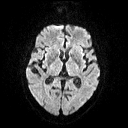
[im 52/52]
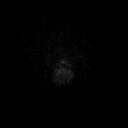

[Series 101: cor (id) · coronal · 3.0mm · 1.80mm/px · 3 of 48 slices shown]
[im 1/48]
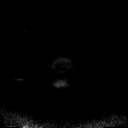
[im 24/48]
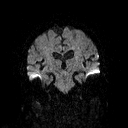
[im 48/48]
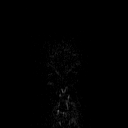

[48 of 48 positions shown; findings below may reference images not displayed]

FINDINGS: BRAIN: The midline structures are normal. There is no acute infarct
or acute hemorrhage. No mass lesion, hydrocephalus, dural
abnormality or extra-axial collection. Early confluent hyperintense
T2-weighted signal of the periventricular and deep white matter,
most commonly due to chronic ischemic microangiopathy. No
age-advanced or lobar predominant atrophy. No chronic
microhemorrhage or superficial siderosis. No abnormal contrast
enhancement.

VASCULAR: Major intracranial arterial and venous sinus flow voids
are preserved.

SKULL AND UPPER CERVICAL SPINE: The visualized skull base,
calvarium, upper cervical spine and extracranial soft tissues are
normal.

SINUSES/ORBITS: No fluid levels or advanced mucosal thickening. No
mastoid or middle ear effusion. Normal orbits.
IMPRESSION: No intracranial metastatic disease.

## 2019-06-18 ENCOUNTER — Other Ambulatory Visit: Payer: Self-pay | Admitting: Infectious Diseases

## 2019-06-18 DIAGNOSIS — C3411 Malignant neoplasm of upper lobe, right bronchus or lung: Secondary | ICD-10-CM

## 2019-06-18 DIAGNOSIS — R0789 Other chest pain: Secondary | ICD-10-CM

## 2019-06-19 DIAGNOSIS — M549 Dorsalgia, unspecified: Secondary | ICD-10-CM | POA: Diagnosis not present

## 2019-06-22 ENCOUNTER — Ambulatory Visit
Admission: RE | Admit: 2019-06-22 | Discharge: 2019-06-22 | Disposition: A | Discharge status: Home / Self Care | Payer: Medicare Other | Source: Ambulatory Visit | Attending: Infectious Diseases | Admitting: Infectious Diseases

## 2019-06-22 ENCOUNTER — Other Ambulatory Visit: Payer: Self-pay

## 2019-06-22 DIAGNOSIS — R0789 Other chest pain: Secondary | ICD-10-CM | POA: Insufficient documentation

## 2019-06-22 DIAGNOSIS — C3411 Malignant neoplasm of upper lobe, right bronchus or lung: Secondary | ICD-10-CM | POA: Insufficient documentation

## 2019-06-22 DIAGNOSIS — C3490 Malignant neoplasm of unspecified part of unspecified bronchus or lung: Secondary | ICD-10-CM | POA: Diagnosis not present

## 2019-06-22 MED ORDER — IOHEXOL 300 MG/ML  SOLN
75.0000 mL | Freq: Once | INTRAMUSCULAR | Status: AC | PRN
  Administered 2019-06-22: 60 mL via INTRAVENOUS

## 2019-06-27 DIAGNOSIS — C3411 Malignant neoplasm of upper lobe, right bronchus or lung: Secondary | ICD-10-CM | POA: Diagnosis not present

## 2019-06-27 DIAGNOSIS — J439 Emphysema, unspecified: Secondary | ICD-10-CM | POA: Diagnosis not present

## 2019-06-27 DIAGNOSIS — R0600 Dyspnea, unspecified: Secondary | ICD-10-CM | POA: Diagnosis not present

## 2019-06-28 ENCOUNTER — Other Ambulatory Visit: Payer: Self-pay | Admitting: *Deleted

## 2019-06-28 MED ORDER — OXYCODONE HCL 5 MG PO TABS: 5.0000 mg | ORAL_TABLET | Freq: Two times a day (BID) | ORAL | 0 refills | Status: DC | PRN

## 2019-06-28 NOTE — Telephone Encounter (Signed)
Patient called cancer center requesting refill of oxycodone 5 mg IR twice daily as needed for pain.  As mandated by the [place] STOP Act (Strengthen Opioid Misuse Prevention), the [place] Controlled Substance Reporting System ([place]) was reviewed for this patient. Below is the past 49-months of controlled substance prescriptions as displayed by the registry. I have personally consulted with my supervising physician, Dr. Rogue Bussing, who agrees that continuation of opiate therapy is medically appropriate at this time and agrees to provide continual monitoring, including urine/blood drug screens, as indicated. Refill is appropriate on or after 02/2019.  She was recently prescribed tramadol by Dr. Ola Spurr on 06/15/2019 total 30 tablets.   [place] reviewed: [facility] reviewed and acceptable for refill.   We will have RN from Dr. Aletha Halim team reach out to see if pain is worsening given her prognosis may warrant a visit and/or palliative/hospice referral.   Faythe Casa, NP 06/28/2019 11:19 AM [phone]

## 2019-07-17 DIAGNOSIS — Z87891 Personal history of nicotine dependence: Secondary | ICD-10-CM | POA: Diagnosis not present

## 2019-07-17 DIAGNOSIS — L219 Seborrheic dermatitis, unspecified: Secondary | ICD-10-CM | POA: Diagnosis not present

## 2019-08-14 ENCOUNTER — Ambulatory Visit
Admission: RE | Admit: 2019-08-14 | Discharge: 2019-08-14 | Disposition: A | Discharge status: Home / Self Care | Payer: Medicare Other | Source: Ambulatory Visit | Attending: Radiation Oncology | Admitting: Radiation Oncology

## 2019-08-14 ENCOUNTER — Other Ambulatory Visit: Payer: Self-pay

## 2019-08-14 DIAGNOSIS — C3411 Malignant neoplasm of upper lobe, right bronchus or lung: Secondary | ICD-10-CM | POA: Diagnosis not present

## 2019-08-14 LAB — POCT I-STAT CREATININE: Creatinine, Ser: 1.3 mg/dL — ABNORMAL HIGH (ref 0.44–1.00)

## 2019-08-14 MED ORDER — IOHEXOL 300 MG/ML  SOLN
75.0000 mL | Freq: Once | INTRAMUSCULAR | Status: AC | PRN
  Administered 2019-08-14: 60 mL via INTRAVENOUS

## 2019-08-17 ENCOUNTER — Other Ambulatory Visit: Payer: Self-pay

## 2019-08-20 ENCOUNTER — Ambulatory Visit
Admission: RE | Admit: 2019-08-20 | Discharge: 2019-08-20 | Disposition: A | Discharge status: Home / Self Care | Payer: Medicare Other | Source: Ambulatory Visit | Attending: Radiation Oncology | Admitting: Radiation Oncology

## 2019-08-20 ENCOUNTER — Other Ambulatory Visit: Payer: Self-pay

## 2019-08-20 ENCOUNTER — Inpatient Hospital Stay: Payer: Medicare Other | Attending: Oncology | Admitting: Oncology

## 2019-08-20 ENCOUNTER — Encounter: Payer: Self-pay | Admitting: Oncology

## 2019-08-20 ENCOUNTER — Encounter: Payer: Self-pay | Admitting: Radiation Oncology

## 2019-08-20 DIAGNOSIS — Z9221 Personal history of antineoplastic chemotherapy: Secondary | ICD-10-CM | POA: Insufficient documentation

## 2019-08-20 DIAGNOSIS — Z79899 Other long term (current) drug therapy: Secondary | ICD-10-CM | POA: Diagnosis not present

## 2019-08-20 DIAGNOSIS — Z923 Personal history of irradiation: Secondary | ICD-10-CM | POA: Diagnosis not present

## 2019-08-20 DIAGNOSIS — R918 Other nonspecific abnormal finding of lung field: Secondary | ICD-10-CM | POA: Insufficient documentation

## 2019-08-20 DIAGNOSIS — C3411 Malignant neoplasm of upper lobe, right bronchus or lung: Secondary | ICD-10-CM

## 2019-08-20 DIAGNOSIS — Z87891 Personal history of nicotine dependence: Secondary | ICD-10-CM | POA: Diagnosis not present

## 2019-08-20 MED ORDER — OXYCODONE HCL 5 MG PO TABS: 5.0000 mg | ORAL_TABLET | Freq: Two times a day (BID) | ORAL | 0 refills | Status: DC | PRN

## 2019-08-20 NOTE — Progress Notes (Signed)
Radiation Oncology Follow up Note  Name: Brenda Parker   Date:   08/20/2019 MRN:  [id number] DOB: Nov 02, 1935    This 83 y.o. female presents to the clinic today for 41-month follow-up status post concurrent chemoradiation therapy to her right lung for stage III non-small cell lung cancer.  REFERRING PROVIDER: Baxter Hire, MD  HPI: Patient is a 83 year old female now at 14 months having completed concurrent chemoradiation therapy for a stage IIIa non-small cell lung cancer of the right lung.  Her tumor was a T4N0 lesion.  She has been doing fairly well is asymptomatic specifically denies cough hemoptysis or chest tightness.Marland Kitchen  Unfortunately her most recent CT scan recently showed 2 right lower lobe solid pulmonary nodules have increased in size worrisome growing pulmonary metastasis.  She also has a new subcentimeter cavitary right middle lobe pulmonary nodule equivocal for pulmonary metastasis.  The area of the right lung previous cavitary lesion has not changed significantly in size.  COMPLICATIONS OF TREATMENT: none  FOLLOW UP COMPLIANCE: keeps appointments   PHYSICAL EXAM:  BP (P) 126/77 (BP Location: Left Arm, Patient Position: Sitting)   Pulse (P) 97   Temp (!) (P) 97.1 F (36.2 C) (Tympanic)   Resp (P) 16   Wt (P) 150 lb 11.2 oz (68.4 kg)   BMI (P) 25.87 kg/m  Well-developed well-nourished patient in NAD. HEENT reveals PERLA, EOMI, discs not visualized.  Oral cavity is clear. No oral mucosal lesions are identified. Neck is clear without evidence of cervical or supraclavicular adenopathy. Lungs are clear to A&P. Cardiac examination is essentially unremarkable with regular rate and rhythm without murmur rub or thrill. Abdomen is benign with no organomegaly or masses noted. Motor sensory and DTR levels are equal and symmetric in the upper and lower extremities. Cranial nerves II through XII are grossly intact. Proprioception is intact. No peripheral adenopathy or edema is identified.  No motor or sensory levels are noted. Crude visual fields are within normal range.  RADIOLOGY RESULTS: CT scans are reviewed and compatible with above-stated findings.  PLAN: At this time I referred her back to medical oncology.  She may benefit from immunotherapy at this time and I will be up to Dr. B.  I have asked to see her back in 3 months for follow-up.  We will discussed the case with Dr. B once he returns from vacation and make a firm treatment plan.  Patient comprehends my recommendations and concerns well.  I would like to take this opportunity to thank you for allowing me to participate in the care of your patient.Noreene Filbert, MD

## 2019-08-20 NOTE — Assessment & Plan Note (Addendum)
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   #Imaging from 08/14/19 revealed an increase to two right lower lobe solid pulmonary nodules worrisome for metastasis, new subcentimeter cavitary middle lobe and new solid medial right lower lobe pulmonary nodule.  Additional cavitary and solid pulmonary nodules in right lung that have not substantially changed but warrant continued follow-up.  # Patient has been evaluated by palliative care at home; recommended hospice.  However patient wants to hold off for hospice for now.  She is agreeable to hospice if clinical situation deteriorate.  #She would like to discuss options including oral chemotherapy versus chemotherapy/immunotherapy with Dr. Yevette Edwards in the next couple weeks.  Disposition: Return to clinic in approximately 2 weeks for labs and MD assessment and discussion of additional treatment options.

## 2019-08-20 NOTE — Progress Notes (Signed)
[place] OFFICE PROGRESS NOTE  Patient Care Team: Leonel Ramsay, MD as PCP - General (Infectious Diseases) Telford Nab, RN as Registered Nurse [name], MD as Medical Oncologist (Medical Oncology)  Cancer Staging No matching staging information was found for the patient.   Oncology History Overview Note  # RUL LUNG CANCER-non-small cell; favor squamous cell; PET scan T4N0 [right upper lobe mass involving[mediastinal/blood results] vs small pleural effusion [?M1]   # Hx of Right breast ca [s/p lumpec; RT; No chemo; "pill" x5 years]  # COPD/Hx of smoking  MOLECULAR TESTING/OMNISEQ: PDL-1 25% [TPS; TMB-H; No targets**]  --------------------------------------------------    DIAGNOSIS: [ April 2019]SQUAMOUS CELL LUNG CA  STAGE: III ;GOALS: CURATIVE  CURRENT/MOST RECENT THERAPY [ May 2019]- Carbo-taxol with RT .     Primary cancer of right upper lobe of lung ([place])  03/06/2018 -  Chemotherapy   The patient had palonosetron (ALOXI) injection 0.25 mg, 0.25 mg, Intravenous,  Once, 9 of 9 cycles Administration: 0.25 mg (03/13/2018), 0.25 mg (04/10/2018), 0.25 mg (04/17/2018), 0.25 mg (03/21/2018), 0.25 mg (04/25/2018), 0.25 mg (03/27/2018), 0.25 mg (04/03/2018), 0.25 mg (05/01/2018), 0.25 mg (05/08/2018) CARBOplatin (PARAPLATIN) 130 mg in sodium chloride 0.9 % 250 mL chemo infusion, 130 mg (100 % of original dose 131.4 mg), Intravenous,  Once, 9 of 9 cycles Dose modification:   (original dose 131.4 mg, Cycle 1) Administration: 130 mg (03/13/2018), 130 mg (04/10/2018), 130 mg (04/17/2018), 130 mg (03/21/2018), 130 mg (04/25/2018), 130 mg (03/27/2018), 130 mg (04/03/2018), 130 mg (05/01/2018), 130 mg (05/08/2018) PACLitaxel (TAXOL) 78 mg in sodium chloride 0.9 % 250 mL chemo infusion (</= 88m/m2), 45 mg/m2 = 78 mg, Intravenous,  Once, 9 of 9 cycles Administration: 78 mg (03/13/2018), 78 mg (04/10/2018), 78 mg (04/17/2018), 78 mg (03/21/2018), 78 mg (04/25/2018), 78 mg (03/27/2018),  78 mg (04/03/2018), 78 mg (05/01/2018), 78 mg (05/08/2018)  for chemotherapy treatment.        INTERVAL HISTORY:  [name].o.  female pleasant patient above history of stage III lung cancer status post chemoradiation-is here for follow-up.  She is currently not on active treatment.  She last received treatment in July 2019 with carbo/Taxol and radiation.  She was evaluated by palliative medicine who recommended hospice but she declined at that time.  She has not been seen in medical oncology in greater than 1 year.  She is followed by radiation oncology with q 6 month imaging.  She was last seen by Dr. CDonella Stadeback in April 2020 to review imaging which showed cavitary right lower lobe lung mass that had decreased significantly favoring cavitary infection and possible inflammatory changes.  She was started on antibiotics.    She is evaluated today by Dr. CDonella Stadewhere they reviewed most recent imaging revealing an increase to two right lower lobe solid pulmonary nodules worrisome for metastasis, new subcentimeter cavitary middle lobe and new solid medial right lower lobe pulmonary nodule.  Additional cavitary and solid pulmonary nodules in right lung that have not substantially changed but warrant continued follow-up.  He suggested she speak with medical oncology regarding additional treatment options.  Currently she feels "fine".  She continues to have mild shortness of breath with exertion but overall feels good.  Her weight is stable in the 150 pounds.  Previously 152 pounds on 02/19/2019.  States her appetite is okay.  Has chronic right-sided thoracic back pain.  Asking for a refill on her pain medication.  Otherwise feels stable.  She is  interested in speaking with Dr. Rogue Bussing to review options for treatment such as a oral chemotherapy or immunotherapy.   Review of Systems  Constitutional: Positive for malaise/fatigue and weight loss.  Respiratory: Positive for cough and shortness of  breath.       PAST MEDICAL HISTORY :  Past Medical History:  Diagnosis Date  . Benign liver cyst   . Breast cancer ([place]) 2005   right breast  . COPD (chronic obstructive pulmonary disease) ([place])   . Dyspnea   . Glaucoma   . Hemoptysis 2019  . Hypertension    Not on medication for htn. patient denies this  . Lung mass 01/2018  . Personal history of radiation therapy     PAST SURGICAL HISTORY :   Past Surgical History:  Procedure Laterality Date  . BREAST EXCISIONAL BIOPSY Right 2005   positive, radiation  . BREAST LUMPECTOMY Right 03/2004  . CHOLECYSTECTOMY  1988  . COLONOSCOPY  2012  . ENDOBRONCHIAL ULTRASOUND N/A 02/21/2018   Procedure: ENDOBRONCHIAL ULTRASOUND;  Surgeon: Laverle Hobby, MD;  Location: [facility] ORS;  Service: Pulmonary;  Laterality: N/A;  . EYE SURGERY Bilateral 2009,2010   cataract extractions with lens implant  . GALLBLADDER SURGERY Bilateral   . IR FLUORO GUIDE PORT INSERTION RIGHT  03/10/2018  . JOINT REPLACEMENT Left 2008   left hip replacement  . PORTACATH PLACEMENT  03/10/2018  . TONSILLECTOMY  1960    FAMILY HISTORY :   Family History  Problem Relation Age of Onset  . Breast cancer Mother 53  . CAD Father     SOCIAL HISTORY:   Social History   Tobacco Use  . Smoking status: Former Smoker    Packs/day: 1.00    Types: Cigarettes    Quit date: 10/25/2005    Years since quitting: 13.8  . Smokeless tobacco: Never Used  Substance Use Topics  . Alcohol use: Not Currently    Comment: occasional glass of beer  . Drug use: Never    ALLERGIES:  is allergic to penicillins.  MEDICATIONS:  Current Outpatient Medications  Medication Sig Dispense Refill  . BREO ELLIPTA 100-25 MCG/INH AEPB INL 1 PUFF ITL QD    . Cholecalciferol (VITAMIN D3) 1000 units CAPS Take 3,000 Units by mouth daily.     Brenda Parker ELIQUIS 2.5 MG TABS tablet     . Fluticasone-Salmeterol (ADVAIR DISKUS) 500-50 MCG/DOSE AEPB Inhale 1 puff into the lungs 2 (two) times daily. 3  each 3  . hydrochlorothiazide (HYDRODIURIL) 25 MG tablet     . latanoprost (XALATAN) 0.005 % ophthalmic solution Place 1 drop into both eyes at bedtime.    Brenda Parker levothyroxine (SYNTHROID) 25 MCG tablet     . lidocaine-prilocaine (EMLA) cream Apply 1 application topically as needed. To port a cath site 30 g 6  . LORazepam (ATIVAN) 1 MG tablet     . losartan (COZAAR) 50 MG tablet     . Multiple Vitamin (MULTIVITAMIN WITH MINERALS) TABS tablet Take 1 tablet by mouth daily. 30 tablet 0  . oxyCODONE (OXY IR/ROXICODONE) 5 MG immediate release tablet Take 1 tablet (5 mg total) by mouth 2 (two) times daily as needed for moderate pain or severe pain. 30 tablet 0  . pantoprazole (PROTONIX) 40 MG tablet     . polyethylene glycol (MIRALAX / GLYCOLAX) packet Take 17 g by mouth daily as needed for mild constipation or moderate constipation.    . potassium chloride 20 MEQ/15ML (10%) SOLN     . simvastatin (ZOCOR)  40 MG tablet     . torsemide (DEMADEX) 20 MG tablet     . TRELEGY ELLIPTA 100-62.5-25 MCG/INH AEPB     . albuterol (PROVENTIL HFA) 108 (90 Base) MCG/ACT inhaler Inhale 2 puffs into the lungs every 6 (six) hours as needed for wheezing or shortness of breath.      No current facility-administered medications for this visit.     PHYSICAL EXAMINATION: ECOG PERFORMANCE STATUS: 1 - Symptomatic but completely ambulatory  BP 126/77 (BP Location: Right Arm, Patient Position: Sitting)   Pulse 97   Temp (!) 97.1 F (36.2 C) (Tympanic)   Resp 16   Wt 150 lb (68 kg)   BMI 25.75 kg/Brenda   Filed Weights   08/20/19 1030  Weight: 150 lb (68 kg)    Physical Exam  Constitutional: She is oriented to person, place, and time and well-developed, well-nourished, and in no distress. Vital signs are normal.  HENT:  Head: Normocephalic and atraumatic.  Eyes: Pupils are equal, round, and reactive to light.  Neck: Normal range of motion.  Cardiovascular: Normal rate, regular rhythm and normal heart sounds.  No  murmur heard. Pulmonary/Chest: Effort normal and breath sounds normal. She has no wheezes.  Abdominal: Soft. Normal appearance and bowel sounds are normal. She exhibits no distension. There is no abdominal tenderness.  Musculoskeletal: Normal range of motion.        General: No edema.     Thoracic back: She exhibits tenderness.       Arms:  Neurological: She is alert and oriented to person, place, and time. Gait normal.  Skin: Skin is warm and dry. No rash noted.  Psychiatric: Mood, memory, affect and judgment normal.    LABORATORY DATA:  I have reviewed the data as listed    Component Value Date/Time   NA 137 07/08/2018 0530   K 3.6 07/08/2018 0530   CL 105 07/08/2018 0530   CO2 25 07/08/2018 0530   GLUCOSE 91 07/08/2018 0530   BUN 15 07/08/2018 0530   CREATININE 1.30 (H) 08/14/2019 1002   CALCIUM 8.1 (L) 07/08/2018 0530   PROT 6.5 07/06/2018 0941   ALBUMIN 2.3 (L) 07/06/2018 0941   AST 55 (H) 07/06/2018 0941   ALT 56 (H) 07/06/2018 0941   ALKPHOS 126 07/06/2018 0941   BILITOT 0.9 07/06/2018 0941   GFRNONAA 49 (L) 07/08/2018 0530   GFRAA 57 (L) 07/08/2018 0530    No results found for: SPEP, UPEP  Lab Results  Component Value Date   WBC 8.0 07/09/2018   NEUTROABS 13.9 (H) 07/06/2018   HGB 9.2 (L) 07/11/2018   HCT 22.9 (L) 07/09/2018   MCV 90.4 07/09/2018   PLT 296 07/09/2018      Chemistry      Component Value Date/Time   NA 137 07/08/2018 0530   K 3.6 07/08/2018 0530   CL 105 07/08/2018 0530   CO2 25 07/08/2018 0530   BUN 15 07/08/2018 0530   CREATININE 1.30 (H) 08/14/2019 1002      Component Value Date/Time   CALCIUM 8.1 (L) 07/08/2018 0530   ALKPHOS 126 07/06/2018 0941   AST 55 (H) 07/06/2018 0941   ALT 56 (H) 07/06/2018 0941   BILITOT 0.9 07/06/2018 0941       RADIOGRAPHIC STUDIES: I have personally reviewed the radiological images as listed and agreed with the findings in the report. No results found.   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   #Imaging from 08/14/19 revealed an increase to two right lower lobe solid pulmonary nodules worrisome for metastasis, new subcentimeter cavitary middle lobe and new solid medial right lower lobe pulmonary nodule.  Additional cavitary and solid pulmonary nodules in right lung that have not substantially changed but warrant continued follow-up.  # Patient has been evaluated by palliative care at home; recommended hospice.  However patient wants to hold off for hospice for now.  She is agreeable to hospice if clinical situation deteriorate.  #She would like to discuss options including oral chemotherapy versus chemotherapy/immunotherapy with Dr. Yevette Edwards in the next couple weeks.  Disposition: Return to clinic in approximately 2 weeks for labs and MD assessment and discussion of additional treatment options.     No orders of the defined types were placed in this encounter.  All questions were answered. The patient knows to call the clinic with any problems, questions or concerns.      Jacquelin Hawking, NP 08/20/2019 1:50 PM

## 2019-08-31 ENCOUNTER — Encounter: Payer: Self-pay | Admitting: Internal Medicine

## 2019-08-31 ENCOUNTER — Other Ambulatory Visit: Payer: Self-pay | Admitting: *Deleted

## 2019-08-31 ENCOUNTER — Other Ambulatory Visit: Payer: Self-pay

## 2019-08-31 DIAGNOSIS — C3411 Malignant neoplasm of upper lobe, right bronchus or lung: Secondary | ICD-10-CM

## 2019-09-03 ENCOUNTER — Other Ambulatory Visit: Payer: Self-pay

## 2019-09-03 ENCOUNTER — Inpatient Hospital Stay (HOSPITAL_BASED_OUTPATIENT_CLINIC_OR_DEPARTMENT_OTHER): Payer: Medicare Other | Admitting: Internal Medicine

## 2019-09-03 ENCOUNTER — Inpatient Hospital Stay: Payer: Medicare Other | Attending: Internal Medicine

## 2019-09-03 ENCOUNTER — Other Ambulatory Visit: Payer: Self-pay | Admitting: *Deleted

## 2019-09-03 DIAGNOSIS — H409 Unspecified glaucoma: Secondary | ICD-10-CM | POA: Insufficient documentation

## 2019-09-03 DIAGNOSIS — Z5112 Encounter for antineoplastic immunotherapy: Secondary | ICD-10-CM | POA: Diagnosis not present

## 2019-09-03 DIAGNOSIS — J449 Chronic obstructive pulmonary disease, unspecified: Secondary | ICD-10-CM | POA: Insufficient documentation

## 2019-09-03 DIAGNOSIS — R0789 Other chest pain: Secondary | ICD-10-CM | POA: Insufficient documentation

## 2019-09-03 DIAGNOSIS — Z95828 Presence of other vascular implants and grafts: Secondary | ICD-10-CM

## 2019-09-03 DIAGNOSIS — C3411 Malignant neoplasm of upper lobe, right bronchus or lung: Secondary | ICD-10-CM

## 2019-09-03 DIAGNOSIS — Z7189 Other specified counseling: Secondary | ICD-10-CM | POA: Diagnosis not present

## 2019-09-03 DIAGNOSIS — F17210 Nicotine dependence, cigarettes, uncomplicated: Secondary | ICD-10-CM | POA: Insufficient documentation

## 2019-09-03 DIAGNOSIS — Z923 Personal history of irradiation: Secondary | ICD-10-CM | POA: Diagnosis not present

## 2019-09-03 DIAGNOSIS — Z9221 Personal history of antineoplastic chemotherapy: Secondary | ICD-10-CM | POA: Insufficient documentation

## 2019-09-03 DIAGNOSIS — I10 Essential (primary) hypertension: Secondary | ICD-10-CM | POA: Insufficient documentation

## 2019-09-03 DIAGNOSIS — Z79899 Other long term (current) drug therapy: Secondary | ICD-10-CM | POA: Diagnosis not present

## 2019-09-03 LAB — CBC WITH DIFFERENTIAL/PLATELET
Abs Immature Granulocytes: 0.04 10*3/uL (ref 0.00–0.07)
Basophils Absolute: 0 10*3/uL (ref 0.0–0.1)
Basophils Relative: 1 %
Eosinophils Absolute: 0.4 10*3/uL (ref 0.0–0.5)
Eosinophils Relative: 5 %
HCT: 35.2 % — ABNORMAL LOW (ref 36.0–46.0)
Hemoglobin: 11.1 g/dL — ABNORMAL LOW (ref 12.0–15.0)
Immature Granulocytes: 1 %
Lymphocytes Relative: 11 %
Lymphs Abs: 0.9 10*3/uL (ref 0.7–4.0)
MCH: 28.3 pg (ref 26.0–34.0)
MCHC: 31.5 g/dL (ref 30.0–36.0)
MCV: 89.8 fL (ref 80.0–100.0)
Monocytes Absolute: 0.7 10*3/uL (ref 0.1–1.0)
Monocytes Relative: 9 %
Neutro Abs: 6.2 10*3/uL (ref 1.7–7.7)
Neutrophils Relative %: 73 %
Platelets: 322 10*3/uL (ref 150–400)
RBC: 3.92 MIL/uL (ref 3.87–5.11)
RDW: 15 % (ref 11.5–15.5)
WBC: 8.3 10*3/uL (ref 4.0–10.5)
nRBC: 0 % (ref 0.0–0.2)

## 2019-09-03 LAB — COMPREHENSIVE METABOLIC PANEL
ALT: 13 U/L (ref 0–44)
AST: 14 U/L — ABNORMAL LOW (ref 15–41)
Albumin: 3.3 g/dL — ABNORMAL LOW (ref 3.5–5.0)
Alkaline Phosphatase: 77 U/L (ref 38–126)
Anion gap: 10 (ref 5–15)
BUN: 21 mg/dL (ref 8–23)
CO2: 23 mmol/L (ref 22–32)
Calcium: 9.2 mg/dL (ref 8.9–10.3)
Chloride: 106 mmol/L (ref 98–111)
Creatinine, Ser: 1.43 mg/dL — ABNORMAL HIGH (ref 0.44–1.00)
GFR calc Af Amer: 40 mL/min — ABNORMAL LOW (ref >60)
GFR calc non Af Amer: 34 mL/min — ABNORMAL LOW (ref >60)
Glucose, Bld: 111 mg/dL — ABNORMAL HIGH (ref 70–99)
Potassium: 4 mmol/L (ref 3.5–5.1)
Sodium: 139 mmol/L (ref 135–145)
Total Bilirubin: 0.4 mg/dL (ref 0.3–1.2)
Total Protein: 8.3 g/dL — ABNORMAL HIGH (ref 6.5–8.1)

## 2019-09-03 LAB — TSH: TSH: 0.728 u[IU]/mL (ref 0.350–4.500)

## 2019-09-03 MED ORDER — SODIUM CHLORIDE 0.9% FLUSH
10.0000 mL | Freq: Once | INTRAVENOUS | Status: AC
  Administered 2019-09-03: 10 mL via INTRAVENOUS
  Filled 2019-09-03: qty 10

## 2019-09-03 MED ORDER — HEPARIN SOD (PORK) LOCK FLUSH 100 UNIT/ML IV SOLN
500.0000 [IU] | Freq: Once | INTRAVENOUS | Status: AC
  Administered 2019-09-03: 500 [IU] via INTRAVENOUS

## 2019-09-03 NOTE — Progress Notes (Signed)
DISCONTINUE ON PATHWAY REGIMEN - Non-Small Cell Lung     Administer weekly:     Paclitaxel      Carboplatin   **Always confirm dose/schedule in your pharmacy ordering system**  REASON: Disease Progression PRIOR TREATMENT: [id number]: Carboplatin AUC=2 + Paclitaxel 45 mg/m2 Weekly During Radiation TREATMENT RESPONSE: Progressive Disease (PD)  START OFF PATHWAY REGIMEN - Non-Small Cell Lung   [id number]:Pembrolizumab 200 mg q21 Days:   A cycle is 21 days:     Pembrolizumab   **Always confirm dose/schedule in your pharmacy ordering system**  Patient Characteristics: Stage IV and Local Recurrence AJCC T Category: T4 Current Disease Status: Local Recurrence AJCC N Category: N0 AJCC M Category: M1a AJCC 8 Stage Grouping: IVA Intent of Therapy: Non-Curative / Palliative Intent, Discussed with Patient

## 2019-09-03 NOTE — Assessment & Plan Note (Addendum)
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 OCT 2020, 20th CT scan-overall stable right upper lung scarring/atelectasis; but middle lobe/lower lobe subcentimeter progressive disease.    #Discussed options of continued surveillance with repeat imaging in 3 months; versus proceeding with systemic therapy with immunotherapy.  Given the multiple subcentimeter lung nodules-unlikely benefit from radiation therapy at this time.  Patient was also recently evaluated by Dr. Donella Stade.  #  I discussed the mechanism of action; The goal of therapy is palliative; and length of treatments are likely ongoing/based upon the results of the scans. Discussed the potential side effects of immunotherapy including but not limited to diarrhea; skin rash; elevated LFTs/endocrine abnormalities etc.  #Right chest wall pain-likely secondary to pleurisy/scar tissue chronic.  Not any worse at this time.  #COPD-stable continue current medication/inhalers.  #Port malfunction-recommend dye at next visit; for now continue peripheral IV.  # DISPOSITION:TSH today #  1 week- MD;  Lorna Dibble  Cc; Dr.Fitzgerald/Fleming.   # I reviewed the blood work- with the patient in detail; also reviewed the imaging independently [as summarized above]; and with the patient in detail.

## 2019-09-03 NOTE — Progress Notes (Signed)
[place] OFFICE PROGRESS NOTE  Patient Care Team: Leonel Ramsay, MD as PCP - General (Infectious Diseases) Telford Nab, RN as Registered Nurse [name], MD as Medical Oncologist (Medical Oncology)  Cancer Staging No matching staging information was found for the patient.   Oncology History Overview Note  # RUL LUNG CANCER-non-small cell; favor squamous cell; PET scan T4N0 [right upper lobe mass involving[mediastinal/blood results] vs small pleural effusion [?M1]  #October 2020-CT progressive subcentimeter right-sided lung lesions;  # NOV 17th 2020- KEYTRUDA q3 W   # Hx of Right breast ca [s/p lumpec; RT; No chemo; "pill" x5 years]  # COPD/Hx of smoking  MOLECULAR TESTING/OMNISEQ: PDL-1 25% [TPS; TMB-H; No targets**]  --------------------------------------------------    DIAGNOSIS: [ April 2019]SQUAMOUS CELL LUNG CA  STAGE: IV/Recurrent ;GOALS: PALLIATIVE  CURRENT/MOST RECENT THERAPY- Keytruda .     Primary cancer of right upper lobe of lung ([place])  03/13/2018 - 05/15/2018 Chemotherapy   The patient had palonosetron (ALOXI) injection 0.25 mg, 0.25 mg, Intravenous,  Once, 9 of 9 cycles Administration: 0.25 mg (03/13/2018), 0.25 mg (04/10/2018), 0.25 mg (04/17/2018), 0.25 mg (03/21/2018), 0.25 mg (04/25/2018), 0.25 mg (03/27/2018), 0.25 mg (04/03/2018), 0.25 mg (05/01/2018), 0.25 mg (05/08/2018) CARBOplatin (PARAPLATIN) 130 mg in sodium chloride 0.9 % 250 mL chemo infusion, 130 mg (100 % of original dose 131.4 mg), Intravenous,  Once, 9 of 9 cycles Dose modification:   (original dose 131.4 mg, Cycle 1) Administration: 130 mg (03/13/2018), 130 mg (04/10/2018), 130 mg (04/17/2018), 130 mg (03/21/2018), 130 mg (04/25/2018), 130 mg (03/27/2018), 130 mg (04/03/2018), 130 mg (05/01/2018), 130 mg (05/08/2018) PACLitaxel (TAXOL) 78 mg in sodium chloride 0.9 % 250 mL chemo infusion (</= 39m/m2), 45 mg/m2 = 78 mg, Intravenous,  Once, 9 of 9 cycles Administration: 78 mg  (03/13/2018), 78 mg (04/10/2018), 78 mg (04/17/2018), 78 mg (03/21/2018), 78 mg (04/25/2018), 78 mg (03/27/2018), 78 mg (04/03/2018), 78 mg (05/01/2018), 78 mg (05/08/2018)  for chemotherapy treatment.    09/10/2019 -  Chemotherapy   The patient had pembrolizumab (KEYTRUDA) 200 mg in sodium chloride 0.9 % 50 mL chemo infusion, 200 mg, Intravenous, Once, 0 of 6 cycles  for chemotherapy treatment.        INTERVAL HISTORY: Patient is a vague historian/patient's son along with the patient  Brenda [id number].o.  female pleasant patient above history of stage III lung cancer status post chemoradiation-is here for follow-up-reviewed the results of the restaging CAT scan.  Patient was last seen in the clinic approximately year ago.  Patient post chemoradiation declined any further therapy given the admissions x2 to the hospital for infection/pneumonia.   Overall patient has been doing fairly steady at home.  However noted to have worsening right chest wall pain of the last 4 to 6 months.  Imaging more recently in October 2020 showed slight worsening of the subcentimeter lung nodules in the right middle lobe lower lobe.  Right upper lobe scarring/atrophy stable.  Otherwise no nausea no vomiting pain no headaches.  Chronic cough chronic shortness of breath.  Review of Systems  Constitutional: Positive for malaise/fatigue. Negative for chills, diaphoresis and fever.  HENT: Negative for nosebleeds and sore throat.   Eyes: Negative for double vision.  Respiratory: Positive for cough and shortness of breath. Negative for hemoptysis, sputum production and wheezing.   Cardiovascular: Negative for chest pain, palpitations and orthopnea.  Gastrointestinal: Negative for abdominal pain, blood in stool, heartburn, melena, nausea and vomiting.  Genitourinary: Negative for dysuria, frequency  and urgency.  Musculoskeletal: Negative for back pain and joint pain.  Skin: Negative.  Negative for itching and rash.   Neurological: Negative for tingling, focal weakness, weakness and headaches.  Endo/Heme/Allergies: Does not bruise/bleed easily.  Psychiatric/Behavioral: Negative for depression. The patient is not nervous/anxious and does not have insomnia.     PAST MEDICAL HISTORY :  Past Medical History:  Diagnosis Date  . Benign liver cyst   . Breast cancer ([place]) 2005   right breast  . COPD (chronic obstructive pulmonary disease) ([place])   . Dyspnea   . Glaucoma   . Hemoptysis 2019  . Hypertension    Not on medication for htn. patient denies this  . Lung mass 01/2018  . Personal history of radiation therapy     PAST SURGICAL HISTORY :   Past Surgical History:  Procedure Laterality Date  . BREAST EXCISIONAL BIOPSY Right 2005   positive, radiation  . BREAST LUMPECTOMY Right 03/2004  . CHOLECYSTECTOMY  1988  . COLONOSCOPY  2012  . ENDOBRONCHIAL ULTRASOUND N/A 02/21/2018   Procedure: ENDOBRONCHIAL ULTRASOUND;  Surgeon: Laverle Hobby, MD;  Location: [facility] ORS;  Service: Pulmonary;  Laterality: N/A;  . EYE SURGERY Bilateral 2009,2010   cataract extractions with lens implant  . GALLBLADDER SURGERY Bilateral   . IR FLUORO GUIDE PORT INSERTION RIGHT  03/10/2018  . JOINT REPLACEMENT Left 2008   left hip replacement  . PORTACATH PLACEMENT  03/10/2018  . TONSILLECTOMY  1960    FAMILY HISTORY :   Family History  Problem Relation Age of Onset  . Breast cancer Mother 29  . CAD Father     SOCIAL HISTORY:   Social History   Tobacco Use  . Smoking status: Former Smoker    Packs/day: 1.00    Types: Cigarettes    Quit date: 10/25/2005    Years since quitting: 13.8  . Smokeless tobacco: Never Used  Substance Use Topics  . Alcohol use: Not Currently    Comment: occasional glass of beer  . Drug use: Never    ALLERGIES:  is allergic to penicillins.  MEDICATIONS:  Current Outpatient Medications  Medication Sig Dispense Refill  . albuterol (PROVENTIL HFA) 108 (90 Base) MCG/ACT  inhaler Inhale 2 puffs into the lungs every 6 (six) hours as needed for wheezing or shortness of breath.     Marland Kitchen BREO ELLIPTA 100-25 MCG/INH AEPB INL 1 PUFF ITL QD    . Cholecalciferol (VITAMIN D3) 1000 units CAPS Take 3,000 Units by mouth daily.     . TRELEGY ELLIPTA 100-62.5-25 MCG/INH AEPB      No current facility-administered medications for this visit.     PHYSICAL EXAMINATION: ECOG PERFORMANCE STATUS: 1 - Symptomatic but completely ambulatory  BP 136/60 (BP Location: Left Arm, Patient Position: Sitting)   Pulse 96   Temp 98.6 F (37 C) (Temporal)   Resp 18   Wt 150 lb 3.2 oz (68.1 kg)   BMI 25.78 kg/m   Filed Weights   09/03/19 1508  Weight: 150 lb 3.2 oz (68.1 kg)    Physical Exam  Constitutional: She is oriented to person, place, and time.  Frail-appearing Caucasian female patient..  She is walking herself.  Accompanied by son.  HENT:  Head: Normocephalic and atraumatic.  Mouth/Throat: Oropharynx is clear and moist. No oropharyngeal exudate.  Eyes: Pupils are equal, round, and reactive to light.  Neck: Normal range of motion. Neck supple.  Cardiovascular: Normal rate and regular rhythm.  Pulmonary/Chest: No respiratory distress. She has  no wheezes.  Decreased breath sounds in the right compared to the left.  Abdominal: Soft. Bowel sounds are normal. She exhibits no distension and no mass. There is no abdominal tenderness. There is no rebound and no guarding.  Musculoskeletal: Normal range of motion.        General: No tenderness or edema.  Neurological: She is alert and oriented to person, place, and time.  Skin: Skin is warm.  Psychiatric: Affect normal.    LABORATORY DATA:  I have reviewed the data as listed    Component Value Date/Time   NA 139 09/03/2019 1412   K 4.0 09/03/2019 1412   CL 106 09/03/2019 1412   CO2 23 09/03/2019 1412   GLUCOSE 111 (H) 09/03/2019 1412   BUN 21 09/03/2019 1412   CREATININE 1.43 (H) 09/03/2019 1412   CALCIUM 9.2 09/03/2019  1412   PROT 8.3 (H) 09/03/2019 1412   ALBUMIN 3.3 (L) 09/03/2019 1412   AST 14 (L) 09/03/2019 1412   ALT 13 09/03/2019 1412   ALKPHOS 77 09/03/2019 1412   BILITOT 0.4 09/03/2019 1412   GFRNONAA 34 (L) 09/03/2019 1412   GFRAA 40 (L) 09/03/2019 1412    No results found for: SPEP, UPEP  Lab Results  Component Value Date   WBC 8.3 09/03/2019   NEUTROABS 6.2 09/03/2019   HGB 11.1 (L) 09/03/2019   HCT 35.2 (L) 09/03/2019   MCV 89.8 09/03/2019   PLT 322 09/03/2019      Chemistry      Component Value Date/Time   NA 139 09/03/2019 1412   K 4.0 09/03/2019 1412   CL 106 09/03/2019 1412   CO2 23 09/03/2019 1412   BUN 21 09/03/2019 1412   CREATININE 1.43 (H) 09/03/2019 1412      Component Value Date/Time   CALCIUM 9.2 09/03/2019 1412   ALKPHOS 77 09/03/2019 1412   AST 14 (L) 09/03/2019 1412   ALT 13 09/03/2019 1412   BILITOT 0.4 09/03/2019 1412       RADIOGRAPHIC STUDIES: I have personally reviewed the radiological images as listed and agreed with the findings in the report. No results found.   ASSESSMENT & PLAN:  Primary cancer of right upper lobe of lung ([place]) # Right upper lobe lung cancer-squamous cell-stage III/ unresectable; on carboplatin Taxol with radiation; currently status post chemo-radiation July 18th 2019. OCT 2020, 20th CT scan-overall stable right upper lung scarring/atelectasis; but middle lobe/lower lobe subcentimeter progressive disease.    #Discussed options of continued surveillance with repeat imaging in 3 months; versus proceeding with systemic therapy with immunotherapy.  Given the multiple subcentimeter lung nodules-unlikely benefit from radiation therapy at this time.  Patient was also recently evaluated by Dr. Donella Stade.  #  I discussed the mechanism of action; The goal of therapy is palliative; and length of treatments are likely ongoing/based upon the results of the scans. Discussed the potential side effects of immunotherapy including but not  limited to diarrhea; skin rash; elevated LFTs/endocrine abnormalities etc.  #Right chest wall pain-likely secondary to pleurisy/scar tissue chronic.  Not any worse at this time.  #COPD-stable continue current medication/inhalers.  #Port malfunction-recommend dye at next visit; for now continue peripheral IV.  # DISPOSITION:TSH today #  1 week- MD;  Lorna Dibble  Cc; Dr.Fitzgerald/Fleming.   # I reviewed the blood work- with the patient in detail; also reviewed the imaging independently [as summarized above]; and with the patient in detail.      Orders Placed This Encounter  Procedures  .  TSH    Standing Status:   Future    Number of Occurrences:   1    Standing Expiration Date:   09/02/2020   All questions were answered. The patient knows to call the clinic with any problems, questions or concerns.      [name], MD 09/03/2019 5:24 PM

## 2019-09-04 ENCOUNTER — Other Ambulatory Visit
Admission: RE | Admit: 2019-09-04 | Discharge: 2019-09-04 | Disposition: A | Discharge status: Home / Self Care | Payer: Medicare Other | Source: Ambulatory Visit | Attending: Internal Medicine | Admitting: Internal Medicine

## 2019-09-04 ENCOUNTER — Other Ambulatory Visit: Payer: Self-pay | Admitting: *Deleted

## 2019-09-04 ENCOUNTER — Telehealth: Payer: Self-pay | Admitting: *Deleted

## 2019-09-04 DIAGNOSIS — Z01812 Encounter for preprocedural laboratory examination: Secondary | ICD-10-CM | POA: Insufficient documentation

## 2019-09-04 DIAGNOSIS — C3411 Malignant neoplasm of upper lobe, right bronchus or lung: Secondary | ICD-10-CM

## 2019-09-04 DIAGNOSIS — Z20828 Contact with and (suspected) exposure to other viral communicable diseases: Secondary | ICD-10-CM | POA: Diagnosis not present

## 2019-09-04 LAB — SARS CORONAVIRUS 2 (TAT 6-24 HRS): SARS Coronavirus 2: NEGATIVE

## 2019-09-04 NOTE — Telephone Encounter (Signed)
Per v/o Dr. Rogue Bussing. Pt contacted to arrange for community covid testing today. Orders entered.  Pt aware that covid testing is required prior to starting her [place] next week.

## 2019-09-06 ENCOUNTER — Telehealth: Payer: Self-pay | Admitting: *Deleted

## 2019-09-06 NOTE — Telephone Encounter (Signed)
Spoke with patient. She is aware of her negative covid testing results.

## 2019-09-06 NOTE — Telephone Encounter (Signed)
-----   Message from [name], MD sent at 09/06/2019  7:26 AM EST ----- Please inform Brenda Parker that her covid test is negative; follow up/treatment as planned.

## 2019-09-11 NOTE — Progress Notes (Signed)
Pre-visit assessment call attempted prior to 09/12/2019 appointment. No answer.

## 2019-09-12 ENCOUNTER — Inpatient Hospital Stay: Payer: Medicare Other

## 2019-09-12 ENCOUNTER — Other Ambulatory Visit: Payer: Self-pay

## 2019-09-12 ENCOUNTER — Ambulatory Visit
Admission: RE | Admit: 2019-09-12 | Discharge: 2019-09-12 | Disposition: A | Discharge status: Home / Self Care | Payer: Medicare Other | Source: Ambulatory Visit | Attending: Internal Medicine | Admitting: Internal Medicine

## 2019-09-12 ENCOUNTER — Inpatient Hospital Stay (HOSPITAL_BASED_OUTPATIENT_CLINIC_OR_DEPARTMENT_OTHER): Payer: Medicare Other | Admitting: Internal Medicine

## 2019-09-12 ENCOUNTER — Telehealth: Payer: Self-pay | Admitting: *Deleted

## 2019-09-12 VITALS — HR 78

## 2019-09-12 VITALS — BP 127/77 | HR 103 | Temp 98.0°F | Wt 151.0 lb

## 2019-09-12 DIAGNOSIS — Z923 Personal history of irradiation: Secondary | ICD-10-CM | POA: Diagnosis not present

## 2019-09-12 DIAGNOSIS — C3411 Malignant neoplasm of upper lobe, right bronchus or lung: Secondary | ICD-10-CM | POA: Diagnosis not present

## 2019-09-12 DIAGNOSIS — Z9221 Personal history of antineoplastic chemotherapy: Secondary | ICD-10-CM | POA: Diagnosis not present

## 2019-09-12 DIAGNOSIS — Z7189 Other specified counseling: Secondary | ICD-10-CM

## 2019-09-12 DIAGNOSIS — Z5112 Encounter for antineoplastic immunotherapy: Secondary | ICD-10-CM | POA: Diagnosis not present

## 2019-09-12 DIAGNOSIS — R0789 Other chest pain: Secondary | ICD-10-CM | POA: Diagnosis not present

## 2019-09-12 DIAGNOSIS — Z452 Encounter for adjustment and management of vascular access device: Secondary | ICD-10-CM | POA: Diagnosis present

## 2019-09-12 DIAGNOSIS — J449 Chronic obstructive pulmonary disease, unspecified: Secondary | ICD-10-CM | POA: Diagnosis not present

## 2019-09-12 DIAGNOSIS — Z95828 Presence of other vascular implants and grafts: Secondary | ICD-10-CM

## 2019-09-12 DIAGNOSIS — T82828A Fibrosis of vascular prosthetic devices, implants and grafts, initial encounter: Secondary | ICD-10-CM | POA: Diagnosis not present

## 2019-09-12 MED ORDER — SODIUM CHLORIDE 0.9% FLUSH
10.0000 mL | Freq: Once | INTRAVENOUS | Status: AC
  Administered 2019-09-12: 15:00:00 10 mL via INTRAVENOUS
  Filled 2019-09-12: qty 10

## 2019-09-12 MED ORDER — IOPAMIDOL (ISOVUE-300) INJECTION 61%
50.0000 mL | Freq: Once | INTRAVENOUS | Status: AC | PRN
  Administered 2019-09-12: 10 mL

## 2019-09-12 MED ORDER — ONDANSETRON HCL 8 MG PO TABS: ORAL_TABLET | ORAL | 1 refills | Status: DC

## 2019-09-12 MED ORDER — HEPARIN SOD (PORK) LOCK FLUSH 100 UNIT/ML IV SOLN
500.0000 [IU] | Freq: Once | INTRAVENOUS | Status: AC
  Administered 2019-09-12: 500 [IU] via INTRAVENOUS
  Filled 2019-09-12: qty 5

## 2019-09-12 MED ORDER — ALTEPLASE 2 MG IJ SOLR
2.0000 mg | Freq: Once | INTRAMUSCULAR | Status: DC
  Filled 2019-09-12: qty 2

## 2019-09-12 MED ORDER — SODIUM CHLORIDE 0.9 % IV SOLN
200.0000 mg | Freq: Once | INTRAVENOUS | Status: AC
  Administered 2019-09-12: 200 mg via INTRAVENOUS
  Filled 2019-09-12: qty 8

## 2019-09-12 MED ORDER — ALTEPLASE 2 MG IJ SOLR: 2.0000 mg | Freq: Once | INTRAMUSCULAR | Status: DC

## 2019-09-12 MED ORDER — SODIUM CHLORIDE 0.9 % IV SOLN
Freq: Once | INTRAVENOUS | Status: AC
  Administered 2019-09-12: 10:00:00 via INTRAVENOUS
  Filled 2019-09-12: qty 250

## 2019-09-12 NOTE — Progress Notes (Signed)
[place] OFFICE PROGRESS NOTE  Patient Care Team: Leonel Ramsay, MD as PCP - General (Infectious Diseases) Telford Nab, RN as Registered Nurse [name], MD as Medical Oncologist (Medical Oncology)  Cancer Staging No matching staging information was found for the patient.   Oncology History Overview Note  # RUL LUNG CANCER-non-small cell; favor squamous cell; PET scan T4N0 [right upper lobe mass involving[mediastinal/blood results] vs small pleural effusion [?M1]  #October 2020-CT progressive subcentimeter right-sided lung lesions;  # NOV 17th 2020- KEYTRUDA q3 W   # Hx of Right breast ca [s/p lumpec; RT; No chemo; "pill" x5 years]  # COPD/Hx of smoking  MOLECULAR TESTING/OMNISEQ: PDL-1 25% [TPS; TMB-H; No targets**]  --------------------------------------------------    DIAGNOSIS: [ April 2019]SQUAMOUS CELL LUNG CA  STAGE: IV/Recurrent ;GOALS: PALLIATIVE  CURRENT/MOST RECENT THERAPY- Keytruda .     Primary cancer of right upper lobe of lung ([place])  03/13/2018 - 05/15/2018 Chemotherapy   The patient had palonosetron (ALOXI) injection 0.25 mg, 0.25 mg, Intravenous,  Once, 9 of 9 cycles Administration: 0.25 mg (03/13/2018), 0.25 mg (04/10/2018), 0.25 mg (04/17/2018), 0.25 mg (03/21/2018), 0.25 mg (04/25/2018), 0.25 mg (03/27/2018), 0.25 mg (04/03/2018), 0.25 mg (05/01/2018), 0.25 mg (05/08/2018) CARBOplatin (PARAPLATIN) 130 mg in sodium chloride 0.9 % 250 mL chemo infusion, 130 mg (100 % of original dose 131.4 mg), Intravenous,  Once, 9 of 9 cycles Dose modification:   (original dose 131.4 mg, Cycle 1) Administration: 130 mg (03/13/2018), 130 mg (04/10/2018), 130 mg (04/17/2018), 130 mg (03/21/2018), 130 mg (04/25/2018), 130 mg (03/27/2018), 130 mg (04/03/2018), 130 mg (05/01/2018), 130 mg (05/08/2018) PACLitaxel (TAXOL) 78 mg in sodium chloride 0.9 % 250 mL chemo infusion (</= 40m/m2), 45 mg/m2 = 78 mg, Intravenous,  Once, 9 of 9 cycles Administration: 78 mg  (03/13/2018), 78 mg (04/10/2018), 78 mg (04/17/2018), 78 mg (03/21/2018), 78 mg (04/25/2018), 78 mg (03/27/2018), 78 mg (04/03/2018), 78 mg (05/01/2018), 78 mg (05/08/2018)  for chemotherapy treatment.    09/12/2019 -  Chemotherapy   The patient had pembrolizumab (KEYTRUDA) 200 mg in sodium chloride 0.9 % 50 mL chemo infusion, 200 mg, Intravenous, Once, 1 of 6 cycles  for chemotherapy treatment.        INTERVAL HISTORY:   Brenda [id number].o.  female patient with recurrent/metastatic squamous cell lung cancer is here to proceed with immunotherapy.  Patient noted to have progression of disease in October 2020.   Currently no nausea no vomiting pain no headaches.  Chronic cough chronic shortness of breath.  Chronic fatigue.  Review of Systems  Constitutional: Positive for malaise/fatigue. Negative for chills, diaphoresis and fever.  HENT: Negative for nosebleeds and sore throat.   Eyes: Negative for double vision.  Respiratory: Positive for cough and shortness of breath. Negative for hemoptysis, sputum production and wheezing.   Cardiovascular: Negative for chest pain, palpitations and orthopnea.  Gastrointestinal: Negative for abdominal pain, blood in stool, heartburn, melena, nausea and vomiting.  Genitourinary: Negative for dysuria, frequency and urgency.  Musculoskeletal: Negative for back pain and joint pain.  Skin: Negative.  Negative for itching and rash.  Neurological: Negative for tingling, focal weakness, weakness and headaches.  Endo/Heme/Allergies: Does not bruise/bleed easily.  Psychiatric/Behavioral: Negative for depression. The patient is not nervous/anxious and does not have insomnia.     PAST MEDICAL HISTORY :  Past Medical History:  Diagnosis Date  . Benign liver cyst   . Breast cancer ([place] 2005   right breast  . COPD (chronic obstructive  pulmonary disease) ([place])   . Dyspnea   . Glaucoma   . Hemoptysis 2019  . Hypertension    Not on medication for htn. patient denies  this  . Lung mass 01/2018  . Personal history of radiation therapy     PAST SURGICAL HISTORY :   Past Surgical History:  Procedure Laterality Date  . BREAST EXCISIONAL BIOPSY Right 2005   positive, radiation  . BREAST LUMPECTOMY Right 03/2004  . CHOLECYSTECTOMY  1988  . COLONOSCOPY  2012  . ENDOBRONCHIAL ULTRASOUND N/A 02/21/2018   Procedure: ENDOBRONCHIAL ULTRASOUND;  Surgeon: Laverle Hobby, MD;  Location: [facility] ORS;  Service: Pulmonary;  Laterality: N/A;  . EYE SURGERY Bilateral 2009,2010   cataract extractions with lens implant  . GALLBLADDER SURGERY Bilateral   . IR FLUORO GUIDE PORT INSERTION RIGHT  03/10/2018  . JOINT REPLACEMENT Left 2008   left hip replacement  . PORTACATH PLACEMENT  03/10/2018  . TONSILLECTOMY  1960    FAMILY HISTORY :   Family History  Problem Relation Age of Onset  . Breast cancer Mother 71  . CAD Father     SOCIAL HISTORY:   Social History   Tobacco Use  . Smoking status: Former Smoker    Packs/day: 1.00    Types: Cigarettes    Quit date: 10/25/2005    Years since quitting: 13.8  . Smokeless tobacco: Never Used  Substance Use Topics  . Alcohol use: Not Currently    Comment: occasional glass of beer  . Drug use: Never    ALLERGIES:  is allergic to penicillins.  MEDICATIONS:  Current Outpatient Medications  Medication Sig Dispense Refill  . albuterol (PROVENTIL HFA) 108 (90 Base) MCG/ACT inhaler Inhale 2 puffs into the lungs every 6 (six) hours as needed for wheezing or shortness of breath.     . Cholecalciferol (VITAMIN D3) 1000 units CAPS Take 3,000 Units by mouth daily.     . TRELEGY ELLIPTA 100-62.5-25 MCG/INH AEPB     . BREO ELLIPTA 100-25 MCG/INH AEPB INL 1 PUFF ITL QD    . latanoprost (XALATAN) 0.005 % ophthalmic solution Apply 1 drop to eye daily.    . ondansetron (ZOFRAN) 8 MG tablet One pill every 8 hours as needed for nausea/vomitting. 40 tablet 1   No current facility-administered medications for this visit.     Facility-Administered Medications Ordered in Other Visits  Medication Dose Route Frequency Provider Last Rate Last Dose  . pembrolizumab (KEYTRUDA) 200 mg in sodium chloride 0.9 % 50 mL chemo infusion  200 mg Intravenous Once [name], MD        PHYSICAL EXAMINATION: ECOG PERFORMANCE STATUS: 1 - Symptomatic but completely ambulatory  BP 127/77 (BP Location: Left Arm, Patient Position: Sitting, Cuff Size: Normal)   Pulse (!) 103   Temp 98 F (36.7 C) (Tympanic)   Wt 151 lb (68.5 kg)   BMI 25.92 kg/m   Filed Weights   09/12/19 0838  Weight: 151 lb (68.5 kg)    Physical Exam  Constitutional: She is oriented to person, place, and time.  Frail-appearing Caucasian female patient..  She is walking herself.  Alone.  HENT:  Head: Normocephalic and atraumatic.  Mouth/Throat: Oropharynx is clear and moist. No oropharyngeal exudate.  Eyes: Pupils are equal, round, and reactive to light.  Neck: Normal range of motion. Neck supple.  Cardiovascular: Normal rate and regular rhythm.  Pulmonary/Chest: No respiratory distress. She has no wheezes.  Decreased breath sounds in the right compared to the  left.  Abdominal: Soft. Bowel sounds are normal. She exhibits no distension and no mass. There is no abdominal tenderness. There is no rebound and no guarding.  Musculoskeletal: Normal range of motion.        General: No tenderness or edema.  Neurological: She is alert and oriented to person, place, and time.  Skin: Skin is warm.  Psychiatric: Affect normal.    LABORATORY DATA:  I have reviewed the data as listed    Component Value Date/Time   NA 139 09/03/2019 1412   K 4.0 09/03/2019 1412   CL 106 09/03/2019 1412   CO2 23 09/03/2019 1412   GLUCOSE 111 (H) 09/03/2019 1412   BUN 21 09/03/2019 1412   CREATININE 1.43 (H) 09/03/2019 1412   CALCIUM 9.2 09/03/2019 1412   PROT 8.3 (H) 09/03/2019 1412   ALBUMIN 3.3 (L) 09/03/2019 1412   AST 14 (L) 09/03/2019 1412   ALT 13  09/03/2019 1412   ALKPHOS 77 09/03/2019 1412   BILITOT 0.4 09/03/2019 1412   GFRNONAA 34 (L) 09/03/2019 1412   GFRAA 40 (L) 09/03/2019 1412    No results found for: SPEP, UPEP  Lab Results  Component Value Date   WBC 8.3 09/03/2019   NEUTROABS 6.2 09/03/2019   HGB 11.1 (L) 09/03/2019   HCT 35.2 (L) 09/03/2019   MCV 89.8 09/03/2019   PLT 322 09/03/2019      Chemistry      Component Value Date/Time   NA 139 09/03/2019 1412   K 4.0 09/03/2019 1412   CL 106 09/03/2019 1412   CO2 23 09/03/2019 1412   BUN 21 09/03/2019 1412   CREATININE 1.43 (H) 09/03/2019 1412      Component Value Date/Time   CALCIUM 9.2 09/03/2019 1412   ALKPHOS 77 09/03/2019 1412   AST 14 (L) 09/03/2019 1412   ALT 13 09/03/2019 1412   BILITOT 0.4 09/03/2019 1412       RADIOGRAPHIC STUDIES: I have personally reviewed the radiological images as listed and agreed with the findings in the report. No results found.   ASSESSMENT & PLAN:  Primary cancer of right upper lobe of lung ([place]) # Right upper lobe lung cancer-squamous cell-stage III/ unresectable; on carboplatin Taxol with radiation; currently status post chemo-radiation July 18th 2019. OCT 2020, 20th CT scan-overall stable right upper lung scarring/atelectasis; but middle lobe/lower lobe subcentimeter progressive disease.    # Proceed with [place] today. Labs today reviewed;  acceptable for treatment today. Will scan after 3-4 cycles.   #  I again discussed the mechanism of action; The goal of therapy is palliative; and length of treatments are likely ongoing/based upon the results of the scans. Discussed the potential side effects of immunotherapy including but not limited to diarrhea; skin rash; elevated LFTs/endocrine abnormalities etc.  #cRight chest wall pain-likely secondary to pleurisy/scar tissue chronic. STABLE.   #COPD- STABLE.  continue current medication/inhalers.  #Port malfunction-recommend dye study today; for now continue  peripheral IV.  # DISPOSITION: #  Keytruda today # dye study- please order # Follow up in 3 weeks- MD; labs-cbc/cmp;Keytruda-Dr.B      Orders Placed This Encounter  Procedures  . DG Fluoro Guide CV Line Left    Standing Status:   Future    Standing Expiration Date:   11/11/2020    Order Specific Question:   Reason for Exam (SYMPTOM  OR DIAGNOSIS REQUIRED)    Answer:   no blood return from port    Order Specific Question:  Preferred imaging location?    Answer:   [facility]  . CBC with Differential    Standing Status:   Future    Standing Expiration Date:   09/11/2020  . Comprehensive metabolic panel    Standing Status:   Future    Standing Expiration Date:   09/11/2020   All questions were answered. The patient knows to call the clinic with any problems, questions or concerns.      [name], MD 09/12/2019 10:02 AM

## 2019-09-12 NOTE — Progress Notes (Signed)
Pt returned from having dye study and cathflo instilled in radiology department.  Port reaccessed, blood return noted with 5 ml wasted per policy.  Port deaccessed after flushing with NS and heparin.

## 2019-09-12 NOTE — Progress Notes (Signed)
0930: Per Nira Conn RN per Dr. Rogue Bussing okay to proceed with Keytruda infusion with 09/03/2019 labs and HR of 103. Pt to receive treatment via PIV , port does not give blood return, port accessed per MD for pt to have a dye study performed. Port flushes without difficulty, no blood return noted.   1155: Pt escorted to medical mall via wheelchair for dye study. Pt stable at time of transfer. Pt educated to return to clinic for port deaccess if unable to be done in radiology. Pt verbalizes understanding.

## 2019-09-12 NOTE — Assessment & Plan Note (Addendum)
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 OCT 2020, 20th CT scan-overall stable right upper lung scarring/atelectasis; but middle lobe/lower lobe subcentimeter progressive disease.    # Proceed with [place] today. Labs today reviewed;  acceptable for treatment today. Will scan after 3-4 cycles.   #  I again discussed the mechanism of action; The goal of therapy is palliative; and length of treatments are likely ongoing/based upon the results of the scans. Discussed the potential side effects of immunotherapy including but not limited to diarrhea; skin rash; elevated LFTs/endocrine abnormalities etc.  #cRight chest wall pain-likely secondary to pleurisy/scar tissue chronic. STABLE.   #COPD- STABLE.  continue current medication/inhalers.  #Port malfunction-recommend dye study today; for now continue peripheral IV.  # DISPOSITION: #  Keytruda today # dye study- please order # Follow up in 3 weeks- MD; labs-cbc/cmp;Keytruda-Dr.B

## 2019-09-12 NOTE — Telephone Encounter (Signed)
Contacted patient to return to clinic asap for TPA maintenance per hospital policy. TPA was administered in IR [facility] dept and not aspirated prior to d/c of Port a needle.  Pt brought back to cancer center to aspirate per policy. Port accessed by Haywood Pao, RN, who was able to obtain a  blood return port. Port flushes now easily without resistance.

## 2019-09-13 ENCOUNTER — Inpatient Hospital Stay: Payer: Medicare Other

## 2019-09-17 DIAGNOSIS — Z87448 Personal history of other diseases of urinary system: Secondary | ICD-10-CM | POA: Diagnosis not present

## 2019-09-17 DIAGNOSIS — H00016 Hordeolum externum left eye, unspecified eyelid: Secondary | ICD-10-CM | POA: Diagnosis not present

## 2019-09-17 DIAGNOSIS — Z853 Personal history of malignant neoplasm of breast: Secondary | ICD-10-CM | POA: Diagnosis not present

## 2019-09-17 DIAGNOSIS — Z87891 Personal history of nicotine dependence: Secondary | ICD-10-CM | POA: Diagnosis not present

## 2019-09-17 DIAGNOSIS — M546 Pain in thoracic spine: Secondary | ICD-10-CM | POA: Diagnosis not present

## 2019-09-17 DIAGNOSIS — C3411 Malignant neoplasm of upper lobe, right bronchus or lung: Secondary | ICD-10-CM | POA: Diagnosis not present

## 2019-09-17 DIAGNOSIS — Z8679 Personal history of other diseases of the circulatory system: Secondary | ICD-10-CM | POA: Diagnosis not present

## 2019-09-18 ENCOUNTER — Other Ambulatory Visit: Payer: Self-pay | Admitting: Physical Medicine and Rehabilitation

## 2019-09-18 DIAGNOSIS — M545 Low back pain: Secondary | ICD-10-CM | POA: Diagnosis not present

## 2019-09-18 DIAGNOSIS — M5136 Other intervertebral disc degeneration, lumbar region: Secondary | ICD-10-CM | POA: Diagnosis not present

## 2019-09-18 DIAGNOSIS — G8929 Other chronic pain: Secondary | ICD-10-CM

## 2019-09-18 DIAGNOSIS — M549 Dorsalgia, unspecified: Secondary | ICD-10-CM | POA: Diagnosis not present

## 2019-09-30 ENCOUNTER — Ambulatory Visit: Payer: Medicare Other

## 2019-10-03 ENCOUNTER — Inpatient Hospital Stay: Payer: Medicare Other

## 2019-10-03 ENCOUNTER — Inpatient Hospital Stay: Payer: Medicare Other | Attending: Internal Medicine

## 2019-10-03 ENCOUNTER — Inpatient Hospital Stay (HOSPITAL_BASED_OUTPATIENT_CLINIC_OR_DEPARTMENT_OTHER): Payer: Medicare Other | Admitting: Internal Medicine

## 2019-10-03 ENCOUNTER — Other Ambulatory Visit: Payer: Self-pay

## 2019-10-03 DIAGNOSIS — Z9221 Personal history of antineoplastic chemotherapy: Secondary | ICD-10-CM | POA: Insufficient documentation

## 2019-10-03 DIAGNOSIS — R0789 Other chest pain: Secondary | ICD-10-CM | POA: Insufficient documentation

## 2019-10-03 DIAGNOSIS — J449 Chronic obstructive pulmonary disease, unspecified: Secondary | ICD-10-CM | POA: Diagnosis not present

## 2019-10-03 DIAGNOSIS — C3411 Malignant neoplasm of upper lobe, right bronchus or lung: Secondary | ICD-10-CM

## 2019-10-03 DIAGNOSIS — Z79899 Other long term (current) drug therapy: Secondary | ICD-10-CM | POA: Diagnosis not present

## 2019-10-03 DIAGNOSIS — M79671 Pain in right foot: Secondary | ICD-10-CM | POA: Insufficient documentation

## 2019-10-03 DIAGNOSIS — M109 Gout, unspecified: Secondary | ICD-10-CM | POA: Diagnosis not present

## 2019-10-03 DIAGNOSIS — Z7189 Other specified counseling: Secondary | ICD-10-CM

## 2019-10-03 DIAGNOSIS — Z853 Personal history of malignant neoplasm of breast: Secondary | ICD-10-CM | POA: Insufficient documentation

## 2019-10-03 DIAGNOSIS — Z87891 Personal history of nicotine dependence: Secondary | ICD-10-CM | POA: Insufficient documentation

## 2019-10-03 LAB — COMPREHENSIVE METABOLIC PANEL
ALT: 13 U/L (ref 0–44)
AST: 16 U/L (ref 15–41)
Albumin: 3.2 g/dL — ABNORMAL LOW (ref 3.5–5.0)
Alkaline Phosphatase: 76 U/L (ref 38–126)
Anion gap: 7 (ref 5–15)
BUN: 19 mg/dL (ref 8–23)
CO2: 23 mmol/L (ref 22–32)
Calcium: 9.1 mg/dL (ref 8.9–10.3)
Chloride: 105 mmol/L (ref 98–111)
Creatinine, Ser: 1.38 mg/dL — ABNORMAL HIGH (ref 0.44–1.00)
GFR calc Af Amer: 41 mL/min — ABNORMAL LOW (ref >60)
GFR calc non Af Amer: 36 mL/min — ABNORMAL LOW (ref >60)
Glucose, Bld: 108 mg/dL — ABNORMAL HIGH (ref 70–99)
Potassium: 3.7 mmol/L (ref 3.5–5.1)
Sodium: 135 mmol/L (ref 135–145)
Total Bilirubin: 0.4 mg/dL (ref 0.3–1.2)
Total Protein: 8.3 g/dL — ABNORMAL HIGH (ref 6.5–8.1)

## 2019-10-03 LAB — CBC WITH DIFFERENTIAL/PLATELET
Abs Immature Granulocytes: 0.03 10*3/uL (ref 0.00–0.07)
Basophils Absolute: 0 10*3/uL (ref 0.0–0.1)
Basophils Relative: 1 %
Eosinophils Absolute: 0.9 10*3/uL — ABNORMAL HIGH (ref 0.0–0.5)
Eosinophils Relative: 10 %
HCT: 36.1 % (ref 36.0–46.0)
Hemoglobin: 11.1 g/dL — ABNORMAL LOW (ref 12.0–15.0)
Immature Granulocytes: 0 %
Lymphocytes Relative: 10 %
Lymphs Abs: 0.8 10*3/uL (ref 0.7–4.0)
MCH: 27.4 pg (ref 26.0–34.0)
MCHC: 30.7 g/dL (ref 30.0–36.0)
MCV: 89.1 fL (ref 80.0–100.0)
Monocytes Absolute: 0.7 10*3/uL (ref 0.1–1.0)
Monocytes Relative: 8 %
Neutro Abs: 6.2 10*3/uL (ref 1.7–7.7)
Neutrophils Relative %: 71 %
Platelets: 377 10*3/uL (ref 150–400)
RBC: 4.05 MIL/uL (ref 3.87–5.11)
RDW: 15 % (ref 11.5–15.5)
WBC: 8.6 10*3/uL (ref 4.0–10.5)
nRBC: 0 % (ref 0.0–0.2)

## 2019-10-03 MED ORDER — HEPARIN SOD (PORK) LOCK FLUSH 100 UNIT/ML IV SOLN: 500.0000 [IU] | Freq: Once | INTRAVENOUS | Status: DC | PRN

## 2019-10-03 MED ORDER — SODIUM CHLORIDE 0.9% FLUSH
10.0000 mL | Freq: Once | INTRAVENOUS | Status: AC
  Administered 2019-10-03: 09:00:00 10 mL via INTRAVENOUS
  Filled 2019-10-03: qty 10

## 2019-10-03 MED ORDER — SODIUM CHLORIDE 0.9 % IV SOLN
200.0000 mg | Freq: Once | INTRAVENOUS | Status: AC
  Administered 2019-10-03: 200 mg via INTRAVENOUS
  Filled 2019-10-03: qty 8

## 2019-10-03 MED ORDER — SODIUM CHLORIDE 0.9 % IV SOLN
Freq: Once | INTRAVENOUS | Status: AC
  Administered 2019-10-03: 10:00:00 via INTRAVENOUS
  Filled 2019-10-03: qty 250

## 2019-10-03 MED ORDER — HEPARIN SOD (PORK) LOCK FLUSH 100 UNIT/ML IV SOLN
500.0000 [IU] | Freq: Once | INTRAVENOUS | Status: AC
  Administered 2019-10-03: 11:00:00 500 [IU] via INTRAVENOUS
  Filled 2019-10-03: qty 5

## 2019-10-03 NOTE — Progress Notes (Signed)
[place] OFFICE PROGRESS NOTE  Patient Care Team: Leonel Ramsay, MD as PCP - General (Infectious Diseases) Telford Nab, RN as Registered Nurse [name], MD as Medical Oncologist (Medical Oncology)  Cancer Staging No matching staging information was found for the patient.   Oncology History Overview Note  # RUL LUNG CANCER-non-small cell; favor squamous cell; PET scan T4N0 [right upper lobe mass involving[mediastinal/blood results] vs small pleural effusion [?M1]  #October 2020-CT progressive subcentimeter right-sided lung lesions;  # NOV 17th 2020- KEYTRUDA q3 W   # Hx of Right breast ca [s/p lumpec; RT; No chemo; "pill" x5 years]  # COPD/Hx of smoking  MOLECULAR TESTING/OMNISEQ: PDL-1 25% [TPS; TMB-H; No targets**]  --------------------------------------------------    DIAGNOSIS: [ April 2019]SQUAMOUS CELL LUNG CA  STAGE: IV/Recurrent ;GOALS: PALLIATIVE  CURRENT/MOST RECENT THERAPY- Keytruda .     Primary cancer of right upper lobe of lung ([place])  03/13/2018 - 05/15/2018 Chemotherapy   The patient had palonosetron (ALOXI) injection 0.25 mg, 0.25 mg, Intravenous,  Once, 9 of 9 cycles Administration: 0.25 mg (03/13/2018), 0.25 mg (04/10/2018), 0.25 mg (04/17/2018), 0.25 mg (03/21/2018), 0.25 mg (04/25/2018), 0.25 mg (03/27/2018), 0.25 mg (04/03/2018), 0.25 mg (05/01/2018), 0.25 mg (05/08/2018) CARBOplatin (PARAPLATIN) 130 mg in sodium chloride 0.9 % 250 mL chemo infusion, 130 mg (100 % of original dose 131.4 mg), Intravenous,  Once, 9 of 9 cycles Dose modification:   (original dose 131.4 mg, Cycle 1) Administration: 130 mg (03/13/2018), 130 mg (04/10/2018), 130 mg (04/17/2018), 130 mg (03/21/2018), 130 mg (04/25/2018), 130 mg (03/27/2018), 130 mg (04/03/2018), 130 mg (05/01/2018), 130 mg (05/08/2018) PACLitaxel (TAXOL) 78 mg in sodium chloride 0.9 % 250 mL chemo infusion (</= 64m/m2), 45 mg/m2 = 78 mg, Intravenous,  Once, 9 of 9 cycles Administration: 78 mg  (03/13/2018), 78 mg (04/10/2018), 78 mg (04/17/2018), 78 mg (03/21/2018), 78 mg (04/25/2018), 78 mg (03/27/2018), 78 mg (04/03/2018), 78 mg (05/01/2018), 78 mg (05/08/2018)  for chemotherapy treatment.    09/12/2019 -  Chemotherapy   The patient had pembrolizumab (KEYTRUDA) 200 mg in sodium chloride 0.9 % 50 mL chemo infusion, 200 mg, Intravenous, Once, 1 of 6 cycles Administration: 200 mg (09/12/2019)  for chemotherapy treatment.        INTERVAL HISTORY:   MSHAMIYA DEMERITT89y.o.  female patient with recurrent/metastatic squamous cell lung cancer-currently on KBeryle Flockis here for follow-up.  Patient is currently status post Keytruda cycle #1 3 weeks ago.  Denies any worsening diarrhea.  No worsening fatigue.  Chronic fatigue.  Chronic shortness of breath chronic cough.  Not any worse.   Review of Systems  Constitutional: Positive for malaise/fatigue. Negative for chills, diaphoresis and fever.  HENT: Negative for nosebleeds and sore throat.   Eyes: Negative for double vision.  Respiratory: Positive for cough and shortness of breath. Negative for hemoptysis, sputum production and wheezing.   Cardiovascular: Negative for chest pain, palpitations and orthopnea.  Gastrointestinal: Negative for abdominal pain, blood in stool, heartburn, melena, nausea and vomiting.  Genitourinary: Negative for dysuria, frequency and urgency.  Musculoskeletal: Negative for back pain and joint pain.  Skin: Negative.  Negative for itching and rash.  Neurological: Negative for tingling, focal weakness, weakness and headaches.  Endo/Heme/Allergies: Does not bruise/bleed easily.  Psychiatric/Behavioral: Negative for depression. The patient is not nervous/anxious and does not have insomnia.     PAST MEDICAL HISTORY :  Past Medical History:  Diagnosis Date  . Benign liver cyst   . Breast cancer ([place] 2005  right breast  . COPD (chronic obstructive pulmonary disease) ([place])   . Dyspnea   . Glaucoma   . Hemoptysis  2019  . Hypertension    Not on medication for htn. patient denies this  . Lung mass 01/2018  . Personal history of radiation therapy     PAST SURGICAL HISTORY :   Past Surgical History:  Procedure Laterality Date  . BREAST EXCISIONAL BIOPSY Right 2005   positive, radiation  . BREAST LUMPECTOMY Right 03/2004  . CHOLECYSTECTOMY  1988  . COLONOSCOPY  2012  . ENDOBRONCHIAL ULTRASOUND N/A 02/21/2018   Procedure: ENDOBRONCHIAL ULTRASOUND;  Surgeon: Laverle Hobby, MD;  Location: [facility] ORS;  Service: Pulmonary;  Laterality: N/A;  . EYE SURGERY Bilateral 2009,2010   cataract extractions with lens implant  . GALLBLADDER SURGERY Bilateral   . IR FLUORO GUIDE PORT INSERTION RIGHT  03/10/2018  . JOINT REPLACEMENT Left 2008   left hip replacement  . PORTACATH PLACEMENT  03/10/2018  . TONSILLECTOMY  1960    FAMILY HISTORY :   Family History  Problem Relation Age of Onset  . Breast cancer Mother 55  . CAD Father     SOCIAL HISTORY:   Social History   Tobacco Use  . Smoking status: Former Smoker    Packs/day: 1.00    Types: Cigarettes    Quit date: 10/25/2005    Years since quitting: 13.9  . Smokeless tobacco: Never Used  Substance Use Topics  . Alcohol use: Not Currently    Comment: occasional glass of beer  . Drug use: Never    ALLERGIES:  is allergic to penicillins.  MEDICATIONS:  Current Outpatient Medications  Medication Sig Dispense Refill  . albuterol (PROVENTIL HFA) 108 (90 Base) MCG/ACT inhaler Inhale 2 puffs into the lungs every 6 (six) hours as needed for wheezing or shortness of breath.     Marland Kitchen BREO ELLIPTA 100-25 MCG/INH AEPB INL 1 PUFF ITL QD    . Cholecalciferol (VITAMIN D3) 1000 units CAPS Take 3,000 Units by mouth daily.     Marland Kitchen latanoprost (XALATAN) 0.005 % ophthalmic solution Apply 1 drop to eye daily.    . ondansetron (ZOFRAN) 8 MG tablet One pill every 8 hours as needed for nausea/vomitting. 40 tablet 1  . TRELEGY ELLIPTA 100-62.5-25 MCG/INH AEPB       No current facility-administered medications for this visit.    Facility-Administered Medications Ordered in Other Visits  Medication Dose Route Frequency Provider Last Rate Last Dose  . heparin lock flush 100 unit/mL  500 Units Intravenous Once [name], MD        PHYSICAL EXAMINATION: ECOG PERFORMANCE STATUS: 1 - Symptomatic but completely ambulatory  BP 128/90 (BP Location: Left Arm, Patient Position: Sitting, Cuff Size: Normal)   Pulse 97   Temp (!) 96.8 F (36 C) (Tympanic)   Wt 149 lb 6 oz (67.8 kg)   BMI 25.64 kg/m   Filed Weights   10/03/19 0849  Weight: 149 lb 6 oz (67.8 kg)    Physical Exam  Constitutional: She is oriented to person, place, and time.  Frail-appearing Caucasian female patient..  She is walking herself.  Alone.  HENT:  Head: Normocephalic and atraumatic.  Mouth/Throat: Oropharynx is clear and moist. No oropharyngeal exudate.  Eyes: Pupils are equal, round, and reactive to light.  Neck: Normal range of motion. Neck supple.  Cardiovascular: Normal rate and regular rhythm.  Pulmonary/Chest: No respiratory distress. She has no wheezes.  Decreased breath sounds in the right  compared to the left.  Abdominal: Soft. Bowel sounds are normal. She exhibits no distension and no mass. There is no abdominal tenderness. There is no rebound and no guarding.  Musculoskeletal: Normal range of motion.        General: No tenderness or edema.  Neurological: She is alert and oriented to person, place, and time.  Skin: Skin is warm.  Psychiatric: Affect normal.    LABORATORY DATA:  I have reviewed the data as listed    Component Value Date/Time   NA 135 10/03/2019 0822   K 3.7 10/03/2019 0822   CL 105 10/03/2019 0822   CO2 23 10/03/2019 0822   GLUCOSE 108 (H) 10/03/2019 0822   BUN 19 10/03/2019 0822   CREATININE 1.38 (H) 10/03/2019 0822   CALCIUM 9.1 10/03/2019 0822   PROT 8.3 (H) 10/03/2019 0822   ALBUMIN 3.2 (L) 10/03/2019 0822   AST 16  10/03/2019 0822   ALT 13 10/03/2019 0822   ALKPHOS 76 10/03/2019 0822   BILITOT 0.4 10/03/2019 0822   GFRNONAA 36 (L) 10/03/2019 0822   GFRAA 41 (L) 10/03/2019 0822    No results found for: SPEP, UPEP  Lab Results  Component Value Date   WBC 8.6 10/03/2019   NEUTROABS 6.2 10/03/2019   HGB 11.1 (L) 10/03/2019   HCT 36.1 10/03/2019   MCV 89.1 10/03/2019   PLT 377 10/03/2019      Chemistry      Component Value Date/Time   NA 135 10/03/2019 0822   K 3.7 10/03/2019 0822   CL 105 10/03/2019 0822   CO2 23 10/03/2019 0822   BUN 19 10/03/2019 0822   CREATININE 1.38 (H) 10/03/2019 0822      Component Value Date/Time   CALCIUM 9.1 10/03/2019 0822   ALKPHOS 76 10/03/2019 0822   AST 16 10/03/2019 0822   ALT 13 10/03/2019 0822   BILITOT 0.4 10/03/2019 0822       RADIOGRAPHIC STUDIES: I have personally reviewed the radiological images as listed and agreed with the findings in the report. No results found.   ASSESSMENT & PLAN:  Primary cancer of right upper lobe of lung ([place]) # Right upper lobe lung cancer-progressive/metastatic-stage IV- OCT 2020, 20th CT scan-overall stable right upper lung scarring/atelectasis; but middle lobe/lower lobe subcentimeter progressive disease.  Currently on keytruda.  Stable.  # Proceed with [place] #2 today. Labs today reviewed;  acceptable for treatment today. Will scan after 3-4 cycles.   #Right chest wall pain-likely secondary to pleurisy/scar tissue chronic-Stable.   #COPD-STABLE; continue current medication/inhalers.  #Port malfunction-Dye study=- fibrin sheath s/p TPA.- resolved.    # DISPOSITION: #  Keytruda today # Follow up in 3 weeks- MD; labs-cbc/cmp;Keytruda-Dr.B      No orders of the defined types were placed in this encounter.  All questions were answered. The patient knows to call the clinic with any problems, questions or concerns.      [name], MD 10/03/2019 9:43 AM

## 2019-10-03 NOTE — Assessment & Plan Note (Addendum)
#   Right upper lobe lung cancer-progressive/metastatic-stage IV- OCT 2020, 20th CT scan-overall stable right upper lung scarring/atelectasis; but middle lobe/lower lobe subcentimeter progressive disease.  Currently on keytruda.  Stable.  # Proceed with [place] #2 today. Labs today reviewed;  acceptable for treatment today. Will scan after 3-4 cycles.   #Right chest wall pain-likely secondary to pleurisy/scar tissue chronic-Stable.   #COPD-STABLE; continue current medication/inhalers.  #Port malfunction-Dye study=- fibrin sheath s/p TPA.- resolved.    # DISPOSITION: #  Keytruda today # Follow up in 3 weeks- MD; labs-cbc/cmp;Keytruda-Dr.B

## 2019-10-03 NOTE — Addendum Note (Signed)
Addended by: Drue Dun on: 10/03/2019 10:00 AM   Modules accepted: Orders

## 2019-10-04 ENCOUNTER — Ambulatory Visit
Admission: RE | Admit: 2019-10-04 | Discharge: 2019-10-04 | Disposition: A | Discharge status: Home / Self Care | Payer: Medicare Other | Source: Ambulatory Visit | Attending: Physical Medicine and Rehabilitation | Admitting: Physical Medicine and Rehabilitation

## 2019-10-04 DIAGNOSIS — M545 Low back pain: Secondary | ICD-10-CM | POA: Insufficient documentation

## 2019-10-04 DIAGNOSIS — G8929 Other chronic pain: Secondary | ICD-10-CM | POA: Insufficient documentation

## 2019-10-17 ENCOUNTER — Telehealth: Payer: Self-pay | Admitting: *Deleted

## 2019-10-17 ENCOUNTER — Other Ambulatory Visit: Payer: Self-pay

## 2019-10-17 ENCOUNTER — Inpatient Hospital Stay (HOSPITAL_BASED_OUTPATIENT_CLINIC_OR_DEPARTMENT_OTHER): Payer: Medicare Other | Admitting: Oncology

## 2019-10-17 VITALS — BP 113/67 | HR 93 | Temp 96.9°F | Resp 20

## 2019-10-17 DIAGNOSIS — M79671 Pain in right foot: Secondary | ICD-10-CM | POA: Diagnosis not present

## 2019-10-17 DIAGNOSIS — C3411 Malignant neoplasm of upper lobe, right bronchus or lung: Secondary | ICD-10-CM | POA: Diagnosis not present

## 2019-10-17 NOTE — Telephone Encounter (Signed)
Per Dr. Jacinto Reap - Please have pt see [facility] NP today.

## 2019-10-17 NOTE — Telephone Encounter (Signed)
Patient accepts appointment for 130 today

## 2019-10-17 NOTE — Progress Notes (Signed)
Symptom Management Consult note [facility]  [phone] [phone]  Patient Care Team: Leonel Ramsay, MD as PCP - General (Infectious Diseases) Telford Nab, RN as Registered Nurse [name], MD as Medical Oncologist (Medical Oncology)   Name of the patient: Brenda Parker  [id number]  06-22-1936   Date of visit: 10/17/2019   Diagnosis- Lung Cancer   Chief complaint/ Reason for visit- Right Heel pain  Heme/Onc history:  Oncology History Overview Note  # RUL LUNG CANCER-non-small cell; favor squamous cell; PET scan T4N0 [right upper lobe mass involving[mediastinal/blood results] vs small pleural effusion [?M1]  #October 2020-CT progressive subcentimeter right-sided lung lesions;  # NOV 17th 2020- KEYTRUDA q3 W   # Hx of Right breast ca [s/p lumpec; RT; No chemo; "pill" x5 years]  # COPD/Hx of smoking  MOLECULAR TESTING/OMNISEQ: PDL-1 25% [TPS; TMB-H; No targets**]  --------------------------------------------------    DIAGNOSIS: [ April 2019]SQUAMOUS CELL LUNG CA  STAGE: IV/Recurrent ;GOALS: PALLIATIVE  CURRENT/MOST RECENT THERAPY- Keytruda .     Primary cancer of right upper lobe of lung ([place])  03/13/2018 - 05/15/2018 Chemotherapy   The patient had palonosetron (ALOXI) injection 0.25 mg, 0.25 mg, Intravenous,  Once, 9 of 9 cycles Administration: 0.25 mg (03/13/2018), 0.25 mg (04/10/2018), 0.25 mg (04/17/2018), 0.25 mg (03/21/2018), 0.25 mg (04/25/2018), 0.25 mg (03/27/2018), 0.25 mg (04/03/2018), 0.25 mg (05/01/2018), 0.25 mg (05/08/2018) CARBOplatin (PARAPLATIN) 130 mg in sodium chloride 0.9 % 250 mL chemo infusion, 130 mg (100 % of original dose 131.4 mg), Intravenous,  Once, 9 of 9 cycles Dose modification:   (original dose 131.4 mg, Cycle 1) Administration: 130 mg (03/13/2018), 130 mg (04/10/2018), 130 mg (04/17/2018), 130 mg (03/21/2018), 130 mg (04/25/2018), 130 mg (03/27/2018), 130 mg (04/03/2018), 130 mg (05/01/2018),  130 mg (05/08/2018) PACLitaxel (TAXOL) 78 mg in sodium chloride 0.9 % 250 mL chemo infusion (</= 40m/m2), 45 mg/m2 = 78 mg, Intravenous,  Once, 9 of 9 cycles Administration: 78 mg (03/13/2018), 78 mg (04/10/2018), 78 mg (04/17/2018), 78 mg (03/21/2018), 78 mg (04/25/2018), 78 mg (03/27/2018), 78 mg (04/03/2018), 78 mg (05/01/2018), 78 mg (05/08/2018)  for chemotherapy treatment.    09/12/2019 -  Chemotherapy   The patient had pembrolizumab (KEYTRUDA) 200 mg in sodium chloride 0.9 % 50 mL chemo infusion, 200 mg, Intravenous, Once, 2 of 6 cycles Administration: 200 mg (09/12/2019), 200 mg (10/03/2019)  for chemotherapy treatment.     Interval history-patient presents to symptom management today for complaints of right heel pain that started yesterday.  Patient states pain began shortly after vacuuming.  She denies injuring her foot.  Patient's friend visited her this morning and noted she was not able to walk due to pain.  She had not taken anything for the pain. Her friend gave her a dose of ibuprofen.  The pain essentially resolved after taking 1 dose of 200 mg ibuprofen.  She denies any neurological complaints, recent fever or illness, easy bleeding or bruising, appetite changes or weight loss.  Denies chest pain, nausea, vomiting, constipation or diarrhea.  Denies any urinary concerns.  ECOG FS:1 - Symptomatic but completely ambulatory  Review of systems- Review of Systems  Constitutional: Positive for malaise/fatigue. Negative for chills, fever and weight loss.  HENT: Negative for congestion, ear pain and tinnitus.   Eyes: Negative.  Negative for blurred vision and double vision.  Respiratory: Negative.  Negative for cough, sputum production and shortness of breath.   Cardiovascular: Negative.  Negative for chest pain, palpitations  and leg swelling.  Gastrointestinal: Negative.  Negative for abdominal pain, constipation, diarrhea, nausea and vomiting.  Genitourinary: Negative for dysuria, frequency and  urgency.  Musculoskeletal: Negative for back pain and falls.       Right foot pain  Skin: Negative.  Negative for rash.  Neurological: Negative.  Negative for weakness and headaches.  Endo/Heme/Allergies: Negative.  Does not bruise/bleed easily.  Psychiatric/Behavioral: Negative.  Negative for depression. The patient is not nervous/anxious and does not have insomnia.     Current treatment- s/p 2 cycles of maintenance Keytruda q 3 weeks  Allergies  Allergen Reactions  . Penicillins Rash    Patient had a rash after an injection in 1973.  Unsure if taken orally will cause any reaction  Has patient had a PCN reaction causing immediate rash, facial/tongue/throat swelling, SOB or lightheadedness with hypotension: Yes Has patient had a PCN reaction causing severe rash involving mucus membranes or skin necrosis: No Has patient had a PCN reaction that required hospitalization: No Has patient had a PCN reaction occurring within the last 10 years: No      Past Medical History:  Diagnosis Date  . Benign liver cyst   . Breast cancer ([place]) 2005   right breast  . COPD (chronic obstructive pulmonary disease) ([place])   . Dyspnea   . Glaucoma   . Hemoptysis 2019  . Hypertension    Not on medication for htn. patient denies this  . Lung mass 01/2018  . Personal history of radiation therapy      Past Surgical History:  Procedure Laterality Date  . BREAST EXCISIONAL BIOPSY Right 2005   positive, radiation  . BREAST LUMPECTOMY Right 03/2004  . CHOLECYSTECTOMY  1988  . COLONOSCOPY  2012  . ENDOBRONCHIAL ULTRASOUND N/A 02/21/2018   Procedure: ENDOBRONCHIAL ULTRASOUND;  Surgeon: Laverle Hobby, MD;  Location: [facility] ORS;  Service: Pulmonary;  Laterality: N/A;  . EYE SURGERY Bilateral 2009,2010   cataract extractions with lens implant  . GALLBLADDER SURGERY Bilateral   . IR FLUORO GUIDE PORT INSERTION RIGHT  03/10/2018  . JOINT REPLACEMENT Left 2008   left hip replacement  . PORTACATH  PLACEMENT  03/10/2018  . TONSILLECTOMY  1960    Social History   Socioeconomic History  . Marital status: Widowed    Spouse name: Not on file  . Number of children: Not on file  . Years of education: Not on file  . Highest education level: Not on file  Occupational History  . Not on file  Tobacco Use  . Smoking status: Former Smoker    Packs/day: 1.00    Types: Cigarettes    Quit date: 10/25/2005    Years since quitting: 14.0  . Smokeless tobacco: Never Used  Substance and Sexual Activity  . Alcohol use: Not Currently    Comment: occasional glass of beer  . Drug use: Never  . Sexual activity: Not Currently  Other Topics Concern  . Not on file  Social History Narrative  . Not on file   Social Determinants of Health   Financial Resource Strain:   . Difficulty of Paying Living Expenses: Not on file  Food Insecurity:   . Worried About Charity fundraiser in the Last Year: Not on file  . Ran Out of Food in the Last Year: Not on file  Transportation Needs:   . Lack of Transportation (Medical): Not on file  . Lack of Transportation (Non-Medical): Not on file  Physical Activity:   . Days of  Exercise per Week: Not on file  . Minutes of Exercise per Session: Not on file  Stress:   . Feeling of Stress : Not on file  Social Connections:   . Frequency of Communication with Friends and Family: Not on file  . Frequency of Social Gatherings with Friends and Family: Not on file  . Attends Religious Services: Not on file  . Active Member of Clubs or Organizations: Not on file  . Attends Archivist Meetings: Not on file  . Marital Status: Not on file  Intimate Partner Violence:   . Fear of Current or Ex-Partner: Not on file  . Emotionally Abused: Not on file  . Physically Abused: Not on file  . Sexually Abused: Not on file    Family History  Problem Relation Age of Onset  . Breast cancer Mother 2  . CAD Father      Current Outpatient Medications:  .   albuterol (PROVENTIL HFA) 108 (90 Base) MCG/ACT inhaler, Inhale 2 puffs into the lungs every 6 (six) hours as needed for wheezing or shortness of breath. , Disp: , Rfl:  .  BREO ELLIPTA 100-25 MCG/INH AEPB, INL 1 PUFF ITL QD, Disp: , Rfl:  .  Cholecalciferol (VITAMIN D3) 1000 units CAPS, Take 3,000 Units by mouth daily. , Disp: , Rfl:  .  latanoprost (XALATAN) 0.005 % ophthalmic solution, Apply 1 drop to eye daily., Disp: , Rfl:  .  ondansetron (ZOFRAN) 8 MG tablet, One pill every 8 hours as needed for nausea/vomitting., Disp: 40 tablet, Rfl: 1 .  traMADol (ULTRAM) 50 MG tablet, Take 50 mg by mouth 2 (two) times daily as needed., Disp: , Rfl:  .  TRELEGY ELLIPTA 100-62.5-25 MCG/INH AEPB, , Disp: , Rfl:   Physical exam:  Vitals:   10/17/19 1352  BP: 113/67  Pulse: 93  Resp: 20  Temp: (!) 96.9 F (36.1 C)  TempSrc: Tympanic   Physical Exam Constitutional:      Appearance: Normal appearance.  HENT:     Head: Normocephalic and atraumatic.  Eyes:     Pupils: Pupils are equal, round, and reactive to light.  Cardiovascular:     Rate and Rhythm: Normal rate and regular rhythm.     Heart sounds: Normal heart sounds. No murmur.  Pulmonary:     Effort: Pulmonary effort is normal.     Breath sounds: Normal breath sounds. No wheezing.  Abdominal:     General: Bowel sounds are normal. There is no distension.     Palpations: Abdomen is soft.     Tenderness: There is no abdominal tenderness.  Musculoskeletal:        General: Normal range of motion.     Cervical back: Normal range of motion.     Right foot: Normal range of motion. No deformity, bunion or foot drop.  Skin:    General: Skin is warm and dry.     Findings: No rash.  Neurological:     Mental Status: She is alert and oriented to person, place, and time.  Psychiatric:        Judgment: Judgment normal.      CMP Latest Ref Rng & Units 10/03/2019  Glucose 70 - 99 mg/dL 108(H)  BUN 8 - 23 mg/dL 19  Creatinine 0.44 - 1.00 mg/dL  1.38(H)  Sodium 135 - 145 mmol/L 135  Potassium 3.5 - 5.1 mmol/L 3.7  Chloride 98 - 111 mmol/L 105  CO2 22 - 32 mmol/L 23  Calcium 8.9 -  10.3 mg/dL 9.1  Total Protein 6.5 - 8.1 g/dL 8.3(H)  Total Bilirubin 0.3 - 1.2 mg/dL 0.4  Alkaline Phos 38 - 126 U/L 76  AST 15 - 41 U/L 16  ALT 0 - 44 U/L 13   CBC Latest Ref Rng & Units 10/03/2019  WBC 4.0 - 10.5 K/uL 8.6  Hemoglobin 12.0 - 15.0 g/dL 11.1(L)  Hematocrit 36.0 - 46.0 % 36.1  Platelets 150 - 400 K/uL 377    No images are attached to the encounter.  MR LUMBAR SPINE WO CONTRAST  Result Date: 10/04/2019 CLINICAL DATA:  Low back pain for 1-2 years. No known injury. EXAM: MRI LUMBAR SPINE WITHOUT CONTRAST TECHNIQUE: Multiplanar, multisequence MR imaging of the lumbar spine was performed. No intravenous contrast was administered. COMPARISON:  MRI lumbar spine 06/03/2015. FINDINGS: Segmentation:  Standard. Alignment:  Normal. Vertebrae: No fracture, evidence of discitis, or bone lesion. Mild degenerative endplate signal change Z3-0 noted. Conus medullaris and cauda equina: Conus extends to the L2 level. Conus and cauda equina appear normal. Paraspinal and other soft tissues: Negative. Disc levels: T11-12 is imaged in the sagittal plane only and negative. T12-L1: Negative. L1-2: Negative. L2-3: Small protrusion left foramen is unchanged. The left foramen remains open. The central canal and right foramen are also patent. L3-4: There is loss of disc space height with a shallow bulge and endplate spur. Mild to moderate facet arthropathy is present. Mild central canal and bilateral foraminal narrowing are again seen. The appearance is unchanged. L4-5: The patient has a shallow disc bulge. There is a new disc protrusion in the left foramen causing moderately severe foraminal stenosis and encroachment on the exiting left L4 root. There is minimal narrowing in the left subarticular recess. The right foramen is open. L5-S1: Minimal disc bulge without  stenosis. IMPRESSION: 1. New disc protrusion in the left foramen at L4-5 causes moderately severe foraminal stenosis and encroachment on the exiting left L4 root. 2. No change in mild central canal and bilateral foraminal narrowing at L3-4. Electronically Signed   By: Inge Rise M.D.   On: 10/04/2019 11:34   Assessment and plan- Patient is a 83 y.o. female who presents to symptom management for complaints of right heel pain.  Symptoms began approximately 1 day ago after vacuuming.  Lung cancer: Status post 2 cycles of maintenance Keytruda.  Scheduled to return to clinic on 10/24/2023 third cycle of Keytruda.  Right heel pain: Patient likely injured while vacuuming yesterday.  Symptoms completely resolved since taking ibuprofen.  Recommend rest ice, compression and elevation.  Patient in agreement with plan.  Plan: Assessment-no deformity on exam RICE Continue NSAID Continue compression stocking  Disposiion: Gout patient to call clinic if symptoms worsen. RTC next week as scheduled.   Visit Diagnosis 1. Inflammatory heel pain, right     Patient expressed understanding and was in agreement with this plan. She also understands that She can call clinic at any time with any questions, concerns, or complaints.   Greater than 50% was spent in counseling and coordination of care with this patient including but not limited to discussion of the relevant topics above (See A&P) including, but not limited to diagnosis and management of acute and chronic medical conditions.   Thank you for allowing me to participate in the care of this very pleasant patient.    Jacquelin Hawking, NP [place] at [facility] Cell - [phone] Pager- [phone] 10/24/2019 9:38 AM

## 2019-10-17 NOTE — Telephone Encounter (Signed)
Patient called reporting that she is having pain in her right heel on the bottom and top, this started last night. "It hurts to walk on it." She denies redness or swelling of the area and denies that she has done anything different that she has been doing. Please advise

## 2019-10-17 NOTE — Patient Instructions (Signed)

## 2019-10-23 ENCOUNTER — Other Ambulatory Visit: Payer: Self-pay

## 2019-10-23 NOTE — Progress Notes (Signed)
Patient pre screened for office appointment, no questions or concerns today. Patient reminded of upcoming appointment time and date. 

## 2019-10-24 ENCOUNTER — Inpatient Hospital Stay: Payer: Medicare Other

## 2019-10-24 ENCOUNTER — Inpatient Hospital Stay: Payer: Medicare Other | Admitting: Internal Medicine

## 2019-10-24 ENCOUNTER — Inpatient Hospital Stay (HOSPITAL_BASED_OUTPATIENT_CLINIC_OR_DEPARTMENT_OTHER): Payer: Medicare Other | Admitting: Internal Medicine

## 2019-10-24 ENCOUNTER — Other Ambulatory Visit: Payer: Self-pay

## 2019-10-24 DIAGNOSIS — C3411 Malignant neoplasm of upper lobe, right bronchus or lung: Secondary | ICD-10-CM

## 2019-10-24 DIAGNOSIS — Z7189 Other specified counseling: Secondary | ICD-10-CM

## 2019-10-24 DIAGNOSIS — Z95828 Presence of other vascular implants and grafts: Secondary | ICD-10-CM

## 2019-10-24 LAB — CBC WITH DIFFERENTIAL/PLATELET
Abs Immature Granulocytes: 0.04 10*3/uL (ref 0.00–0.07)
Basophils Absolute: 0.1 10*3/uL (ref 0.0–0.1)
Basophils Relative: 1 %
Eosinophils Absolute: 0.6 10*3/uL — ABNORMAL HIGH (ref 0.0–0.5)
Eosinophils Relative: 7 %
HCT: 34.6 % — ABNORMAL LOW (ref 36.0–46.0)
Hemoglobin: 10.3 g/dL — ABNORMAL LOW (ref 12.0–15.0)
Immature Granulocytes: 1 %
Lymphocytes Relative: 8 %
Lymphs Abs: 0.7 10*3/uL (ref 0.7–4.0)
MCH: 26.7 pg (ref 26.0–34.0)
MCHC: 29.8 g/dL — ABNORMAL LOW (ref 30.0–36.0)
MCV: 89.6 fL (ref 80.0–100.0)
Monocytes Absolute: 0.6 10*3/uL (ref 0.1–1.0)
Monocytes Relative: 8 %
Neutro Abs: 6.5 10*3/uL (ref 1.7–7.7)
Neutrophils Relative %: 75 %
Platelets: 384 10*3/uL (ref 150–400)
RBC: 3.86 MIL/uL — ABNORMAL LOW (ref 3.87–5.11)
RDW: 15.6 % — ABNORMAL HIGH (ref 11.5–15.5)
WBC: 8.5 10*3/uL (ref 4.0–10.5)
nRBC: 0 % (ref 0.0–0.2)

## 2019-10-24 LAB — COMPREHENSIVE METABOLIC PANEL
ALT: 12 U/L (ref 0–44)
AST: 15 U/L (ref 15–41)
Albumin: 3 g/dL — ABNORMAL LOW (ref 3.5–5.0)
Alkaline Phosphatase: 71 U/L (ref 38–126)
Anion gap: 8 (ref 5–15)
BUN: 23 mg/dL (ref 8–23)
CO2: 23 mmol/L (ref 22–32)
Calcium: 9 mg/dL (ref 8.9–10.3)
Chloride: 108 mmol/L (ref 98–111)
Creatinine, Ser: 1.38 mg/dL — ABNORMAL HIGH (ref 0.44–1.00)
GFR calc Af Amer: 41 mL/min — ABNORMAL LOW (ref >60)
GFR calc non Af Amer: 36 mL/min — ABNORMAL LOW (ref >60)
Glucose, Bld: 131 mg/dL — ABNORMAL HIGH (ref 70–99)
Potassium: 3.7 mmol/L (ref 3.5–5.1)
Sodium: 139 mmol/L (ref 135–145)
Total Bilirubin: 0.4 mg/dL (ref 0.3–1.2)
Total Protein: 7.8 g/dL (ref 6.5–8.1)

## 2019-10-24 MED ORDER — SODIUM CHLORIDE 0.9 % IV SOLN
200.0000 mg | Freq: Once | INTRAVENOUS | Status: AC
  Administered 2019-10-24: 14:00:00 200 mg via INTRAVENOUS
  Filled 2019-10-24: qty 8

## 2019-10-24 MED ORDER — SODIUM CHLORIDE 0.9 % IV SOLN
Freq: Once | INTRAVENOUS | Status: AC
  Filled 2019-10-24: qty 250

## 2019-10-24 MED ORDER — SODIUM CHLORIDE 0.9% FLUSH
10.0000 mL | Freq: Once | INTRAVENOUS | Status: AC
  Administered 2019-10-24: 13:00:00 10 mL via INTRAVENOUS
  Filled 2019-10-24: qty 10

## 2019-10-24 MED ORDER — HEPARIN SOD (PORK) LOCK FLUSH 100 UNIT/ML IV SOLN
500.0000 [IU] | Freq: Once | INTRAVENOUS | Status: AC | PRN
  Administered 2019-10-24: 15:00:00 500 [IU]
  Filled 2019-10-24: qty 5

## 2019-10-24 MED ORDER — HEPARIN SOD (PORK) LOCK FLUSH 100 UNIT/ML IV SOLN
INTRAVENOUS | Status: AC
  Filled 2019-10-24: qty 5

## 2019-10-24 NOTE — Progress Notes (Signed)
[place] OFFICE PROGRESS NOTE  Patient Care Team: Leonel Ramsay, MD as PCP - General (Infectious Diseases) Telford Nab, RN as Registered Nurse [name], MD as Medical Oncologist (Medical Oncology)  Cancer Staging No matching staging information was found for the patient.   Oncology History Overview Note  # RUL LUNG CANCER-non-small cell; favor squamous cell; PET scan T4N0 [right upper lobe mass involving[mediastinal/blood results] vs small pleural effusion [?M1]  #October 2020-CT progressive subcentimeter right-sided lung lesions;  # NOV 17th 2020- KEYTRUDA q3 W   # Hx of Right breast ca [s/p lumpec; RT; No chemo; "pill" x5 years]  # COPD/Hx of smoking  MOLECULAR TESTING/OMNISEQ: PDL-1 25% [TPS; TMB-H; No targets**]  --------------------------------------------------    DIAGNOSIS: [ April 2019]SQUAMOUS CELL LUNG CA  STAGE: IV/Recurrent ;GOALS: PALLIATIVE  CURRENT/MOST RECENT THERAPY- Keytruda [C] .     Primary cancer of right upper lobe of lung ([place])  03/13/2018 - 05/15/2018 Chemotherapy   The patient had palonosetron (ALOXI) injection 0.25 mg, 0.25 mg, Intravenous,  Once, 9 of 9 cycles Administration: 0.25 mg (03/13/2018), 0.25 mg (04/10/2018), 0.25 mg (04/17/2018), 0.25 mg (03/21/2018), 0.25 mg (04/25/2018), 0.25 mg (03/27/2018), 0.25 mg (04/03/2018), 0.25 mg (05/01/2018), 0.25 mg (05/08/2018) CARBOplatin (PARAPLATIN) 130 mg in sodium chloride 0.9 % 250 mL chemo infusion, 130 mg (100 % of original dose 131.4 mg), Intravenous,  Once, 9 of 9 cycles Dose modification:   (original dose 131.4 mg, Cycle 1) Administration: 130 mg (03/13/2018), 130 mg (04/10/2018), 130 mg (04/17/2018), 130 mg (03/21/2018), 130 mg (04/25/2018), 130 mg (03/27/2018), 130 mg (04/03/2018), 130 mg (05/01/2018), 130 mg (05/08/2018) PACLitaxel (TAXOL) 78 mg in sodium chloride 0.9 % 250 mL chemo infusion (</= 46m/m2), 45 mg/m2 = 78 mg, Intravenous,  Once, 9 of 9 cycles Administration: 78 mg  (03/13/2018), 78 mg (04/10/2018), 78 mg (04/17/2018), 78 mg (03/21/2018), 78 mg (04/25/2018), 78 mg (03/27/2018), 78 mg (04/03/2018), 78 mg (05/01/2018), 78 mg (05/08/2018)  for chemotherapy treatment.    09/12/2019 -  Chemotherapy   The patient had pembrolizumab (KEYTRUDA) 200 mg in sodium chloride 0.9 % 50 mL chemo infusion, 200 mg, Intravenous, Once, 3 of 6 cycles Administration: 200 mg (09/12/2019), 200 mg (10/03/2019)  for chemotherapy treatment.        INTERVAL HISTORY:   [place] [id number].o.  female patient with recurrent/metastatic squamous cell lung cancer-currently on KBeryle Flockis here for follow-up.  Patient is currently status post Keytruda cycle #3 appx 3 weeks ago.  Patient had episode of right heel pain.  Currently resolved.  She also had low back pain for which she was evaluated with MRI that shows arthritis/prolapsed disc.  Otherwise denies any worsening shortness of breath or cough.  No chest pain.   Review of Systems  Constitutional: Positive for malaise/fatigue. Negative for chills, diaphoresis and fever.  HENT: Negative for nosebleeds and sore throat.   Eyes: Negative for double vision.  Respiratory: Positive for cough and shortness of breath. Negative for hemoptysis, sputum production and wheezing.   Cardiovascular: Negative for chest pain, palpitations and orthopnea.  Gastrointestinal: Negative for abdominal pain, blood in stool, heartburn, melena, nausea and vomiting.  Genitourinary: Negative for dysuria, frequency and urgency.  Musculoskeletal: Positive for joint pain. Negative for back pain.  Skin: Negative.  Negative for itching and rash.  Neurological: Negative for tingling, focal weakness, weakness and headaches.  Endo/Heme/Allergies: Does not bruise/bleed easily.  Psychiatric/Behavioral: Negative for depression. The patient is not nervous/anxious and does not have insomnia.  PAST MEDICAL HISTORY :  Past Medical History:  Diagnosis Date  . Benign liver cyst    . Breast cancer ([place]) 2005   right breast  . COPD (chronic obstructive pulmonary disease) ([place])   . Dyspnea   . Glaucoma   . Hemoptysis 2019  . Hypertension    Not on medication for htn. patient denies this  . Lung mass 01/2018  . Personal history of radiation therapy     PAST SURGICAL HISTORY :   Past Surgical History:  Procedure Laterality Date  . BREAST EXCISIONAL BIOPSY Right 2005   positive, radiation  . BREAST LUMPECTOMY Right 03/2004  . CHOLECYSTECTOMY  1988  . COLONOSCOPY  2012  . ENDOBRONCHIAL ULTRASOUND N/A 02/21/2018   Procedure: ENDOBRONCHIAL ULTRASOUND;  Surgeon: Laverle Hobby, MD;  Location: [facility] ORS;  Service: Pulmonary;  Laterality: N/A;  . EYE SURGERY Bilateral 2009,2010   cataract extractions with lens implant  . GALLBLADDER SURGERY Bilateral   . IR FLUORO GUIDE PORT INSERTION RIGHT  03/10/2018  . JOINT REPLACEMENT Left 2008   left hip replacement  . PORTACATH PLACEMENT  03/10/2018  . TONSILLECTOMY  1960    FAMILY HISTORY :   Family History  Problem Relation Age of Onset  . Breast cancer Mother 59  . CAD Father     SOCIAL HISTORY:   Social History   Tobacco Use  . Smoking status: Former Smoker    Packs/day: 1.00    Types: Cigarettes    Quit date: 10/25/2005    Years since quitting: 14.0  . Smokeless tobacco: Never Used  Substance Use Topics  . Alcohol use: Not Currently    Comment: occasional glass of beer  . Drug use: Never    ALLERGIES:  is allergic to penicillins.  MEDICATIONS:  Current Outpatient Medications  Medication Sig Dispense Refill  . albuterol (PROVENTIL HFA) 108 (90 Base) MCG/ACT inhaler Inhale 2 puffs into the lungs every 6 (six) hours as needed for wheezing or shortness of breath.     Marland Kitchen BREO ELLIPTA 100-25 MCG/INH AEPB INL 1 PUFF ITL QD    . Cholecalciferol (VITAMIN D3) 1000 units CAPS Take 3,000 Units by mouth daily.     Marland Kitchen latanoprost (XALATAN) 0.005 % ophthalmic solution Apply 1 drop to eye daily.    .  ondansetron (ZOFRAN) 8 MG tablet One pill every 8 hours as needed for nausea/vomitting. 40 tablet 1  . traMADol (ULTRAM) 50 MG tablet Take 50 mg by mouth 2 (two) times daily as needed.    . TRELEGY ELLIPTA 100-62.5-25 MCG/INH AEPB      No current facility-administered medications for this visit.   Facility-Administered Medications Ordered in Other Visits  Medication Dose Route Frequency Provider Last Rate Last Admin  . heparin lock flush 100 unit/mL  500 Units Intracatheter Once PRN [name], MD      . pembrolizumab [facility]) 200 mg in sodium chloride 0.9 % 50 mL chemo infusion  200 mg Intravenous Once [name], MD 116 mL/hr at 10/24/19 1420 200 mg at 10/24/19 1420    PHYSICAL EXAMINATION: ECOG PERFORMANCE STATUS: 1 - Symptomatic but completely ambulatory  BP 130/66 (BP Location: Left Arm, Patient Position: Sitting)   Pulse 95   Temp 98 F (36.7 C) (Tympanic)   Resp 18   Wt 148 lb 3.2 oz (67.2 kg)   SpO2 98%   BMI 25.44 kg/m   Filed Weights   10/24/19 1311  Weight: 148 lb 3.2 oz (67.2 kg)  Physical Exam  Constitutional: She is oriented to person, place, and time.  Frail-appearing Caucasian female patient..  She is walking herself.  Alone.  HENT:  Head: Normocephalic and atraumatic.  Mouth/Throat: Oropharynx is clear and moist. No oropharyngeal exudate.  Eyes: Pupils are equal, round, and reactive to light.  Cardiovascular: Normal rate and regular rhythm.  Pulmonary/Chest: No respiratory distress. She has no wheezes.  Decreased breath sounds in the right compared to the left.  Abdominal: Soft. Bowel sounds are normal. She exhibits no distension and no mass. There is no abdominal tenderness. There is no rebound and no guarding.  Musculoskeletal:        General: No tenderness or edema. Normal range of motion.     Cervical back: Normal range of motion and neck supple.  Neurological: She is alert and oriented to person, place, and time.  Skin: Skin  is warm.  Psychiatric: Affect normal.    LABORATORY DATA:  I have reviewed the data as listed    Component Value Date/Time   NA 139 10/24/2019 1258   K 3.7 10/24/2019 1258   CL 108 10/24/2019 1258   CO2 23 10/24/2019 1258   GLUCOSE 131 (H) 10/24/2019 1258   BUN 23 10/24/2019 1258   CREATININE 1.38 (H) 10/24/2019 1258   CALCIUM 9.0 10/24/2019 1258   PROT 7.8 10/24/2019 1258   ALBUMIN 3.0 (L) 10/24/2019 1258   AST 15 10/24/2019 1258   ALT 12 10/24/2019 1258   ALKPHOS 71 10/24/2019 1258   BILITOT 0.4 10/24/2019 1258   GFRNONAA 36 (L) 10/24/2019 1258   GFRAA 41 (L) 10/24/2019 1258    No results found for: SPEP, UPEP  Lab Results  Component Value Date   WBC 8.5 10/24/2019   NEUTROABS 6.5 10/24/2019   HGB 10.3 (L) 10/24/2019   HCT 34.6 (L) 10/24/2019   MCV 89.6 10/24/2019   PLT 384 10/24/2019      Chemistry      Component Value Date/Time   NA 139 10/24/2019 1258   K 3.7 10/24/2019 1258   CL 108 10/24/2019 1258   CO2 23 10/24/2019 1258   BUN 23 10/24/2019 1258   CREATININE 1.38 (H) 10/24/2019 1258      Component Value Date/Time   CALCIUM 9.0 10/24/2019 1258   ALKPHOS 71 10/24/2019 1258   AST 15 10/24/2019 1258   ALT 12 10/24/2019 1258   BILITOT 0.4 10/24/2019 1258       RADIOGRAPHIC STUDIES: I have personally reviewed the radiological images as listed and agreed with the findings in the report. No results found.   ASSESSMENT & PLAN:  Primary cancer of right upper lobe of lung ([place]) # Right upper lobe lung cancer-progressive/metastatic-stage IV- OCT 2020, 20th CT scan-overall stable right upper lung scarring/atelectasis; but middle lobe/lower lobe subcentimeter progressive disease.  Currently on keytruda.  Stable.   # Proceed with [place] #3 today. Labs today reviewed;  acceptable for treatment today. Will scan after 4 cycles; will order CT scan at next visit.   #Right chest wall pain-likely secondary to pleurisy/scar tissue chronic- STABLE.   #COPD-  STABLE; continue current medication/inhalers.   # DISPOSITION: #  Keytruda today # Follow up in 3 weeks- MD; labs-cbc/cmp;Keytruda-Dr.B      Orders Placed This Encounter  Procedures  . CBC with Differential    Standing Status:   Standing    Number of Occurrences:   20    Standing Expiration Date:   10/23/2020  . Comprehensive metabolic panel  Standing Status:   Standing    Number of Occurrences:   20    Standing Expiration Date:   10/23/2020   All questions were answered. The patient knows to call the clinic with any problems, questions or concerns.      [name], MD 10/24/2019 2:35 PM

## 2019-10-24 NOTE — Assessment & Plan Note (Addendum)
#   Right upper lobe lung cancer-progressive/metastatic-stage IV- OCT 2020, 20th CT scan-overall stable right upper lung scarring/atelectasis; but middle lobe/lower lobe subcentimeter progressive disease.  Currently on keytruda.  Stable.   # Proceed with [place] #3 today. Labs today reviewed;  acceptable for treatment today. Will scan after 4 cycles; will order CT scan at next visit.   #Right chest wall pain-likely secondary to pleurisy/scar tissue chronic- STABLE.   #COPD- STABLE; continue current medication/inhalers.   # DISPOSITION: #  Keytruda today # Follow up in 3 weeks- MD; labs-cbc/cmp;Keytruda-Dr.B

## 2019-10-31 ENCOUNTER — Telehealth: Payer: Self-pay | Admitting: *Deleted

## 2019-10-31 NOTE — Telephone Encounter (Signed)
Patient informed to call PCP as this is unrelated to her cancer treatment. Sh e stated she would do that

## 2019-10-31 NOTE — Telephone Encounter (Signed)
Patient called reporting that now her left index finger is swollen and painful for 1 week now and is asking to see Lorretta Harp, NP for this. Please advise

## 2019-10-31 NOTE — Telephone Encounter (Signed)
PCP please. I feel its unrelated to treatments or her cancer.   Faythe Casa, NP 10/31/2019 11:35 AM

## 2019-11-09 ENCOUNTER — Telehealth: Payer: Self-pay | Admitting: Primary Care

## 2019-11-09 NOTE — Telephone Encounter (Signed)
Continued attempts to reach patient, result with apparent hang ups. Called emergency contact and left message that if Brenda Parker would like Community Palliative NP visits to please call our agency and book with me. From chart note in May she'd wanted to continue but attempts to book an appointment have failed.

## 2019-11-13 ENCOUNTER — Other Ambulatory Visit: Payer: Self-pay

## 2019-11-13 NOTE — Progress Notes (Signed)
Patient pre screened for office appointment, no questions or concerns today. Patient reminded of upcoming appointment time and date. 

## 2019-11-14 ENCOUNTER — Other Ambulatory Visit: Payer: Self-pay

## 2019-11-14 ENCOUNTER — Inpatient Hospital Stay (HOSPITAL_BASED_OUTPATIENT_CLINIC_OR_DEPARTMENT_OTHER): Payer: Medicare Other | Admitting: Internal Medicine

## 2019-11-14 ENCOUNTER — Inpatient Hospital Stay: Payer: Medicare Other

## 2019-11-14 ENCOUNTER — Inpatient Hospital Stay: Payer: Medicare Other | Attending: Internal Medicine | Admitting: *Deleted

## 2019-11-14 VITALS — BP 131/66 | HR 92 | Temp 96.6°F | Resp 20 | Wt 147.0 lb

## 2019-11-14 DIAGNOSIS — C3411 Malignant neoplasm of upper lobe, right bronchus or lung: Secondary | ICD-10-CM | POA: Diagnosis not present

## 2019-11-14 DIAGNOSIS — J449 Chronic obstructive pulmonary disease, unspecified: Secondary | ICD-10-CM | POA: Diagnosis not present

## 2019-11-14 DIAGNOSIS — Z7189 Other specified counseling: Secondary | ICD-10-CM

## 2019-11-14 DIAGNOSIS — Z79899 Other long term (current) drug therapy: Secondary | ICD-10-CM | POA: Insufficient documentation

## 2019-11-14 DIAGNOSIS — Z5111 Encounter for antineoplastic chemotherapy: Secondary | ICD-10-CM | POA: Insufficient documentation

## 2019-11-14 DIAGNOSIS — Z95828 Presence of other vascular implants and grafts: Secondary | ICD-10-CM

## 2019-11-14 LAB — CBC WITH DIFFERENTIAL/PLATELET
Abs Immature Granulocytes: 0.14 10*3/uL — ABNORMAL HIGH (ref 0.00–0.07)
Basophils Absolute: 0 10*3/uL (ref 0.0–0.1)
Basophils Relative: 0 %
Eosinophils Absolute: 0.3 10*3/uL (ref 0.0–0.5)
Eosinophils Relative: 3 %
HCT: 39.2 % (ref 36.0–46.0)
Hemoglobin: 11.7 g/dL — ABNORMAL LOW (ref 12.0–15.0)
Immature Granulocytes: 1 %
Lymphocytes Relative: 9 %
Lymphs Abs: 1.1 10*3/uL (ref 0.7–4.0)
MCH: 26.8 pg (ref 26.0–34.0)
MCHC: 29.8 g/dL — ABNORMAL LOW (ref 30.0–36.0)
MCV: 89.9 fL (ref 80.0–100.0)
Monocytes Absolute: 1.1 10*3/uL — ABNORMAL HIGH (ref 0.1–1.0)
Monocytes Relative: 9 %
Neutro Abs: 9.8 10*3/uL — ABNORMAL HIGH (ref 1.7–7.7)
Neutrophils Relative %: 78 %
Platelets: 348 10*3/uL (ref 150–400)
RBC: 4.36 MIL/uL (ref 3.87–5.11)
RDW: 16.1 % — ABNORMAL HIGH (ref 11.5–15.5)
WBC: 12.6 10*3/uL — ABNORMAL HIGH (ref 4.0–10.5)
nRBC: 0 % (ref 0.0–0.2)

## 2019-11-14 LAB — COMPREHENSIVE METABOLIC PANEL
ALT: 36 U/L (ref 0–44)
AST: 19 U/L (ref 15–41)
Albumin: 3.1 g/dL — ABNORMAL LOW (ref 3.5–5.0)
Alkaline Phosphatase: 77 U/L (ref 38–126)
Anion gap: 9 (ref 5–15)
BUN: 26 mg/dL — ABNORMAL HIGH (ref 8–23)
CO2: 26 mmol/L (ref 22–32)
Calcium: 9.1 mg/dL (ref 8.9–10.3)
Chloride: 105 mmol/L (ref 98–111)
Creatinine, Ser: 1.32 mg/dL — ABNORMAL HIGH (ref 0.44–1.00)
GFR calc Af Amer: 44 mL/min — ABNORMAL LOW (ref >60)
GFR calc non Af Amer: 38 mL/min — ABNORMAL LOW (ref >60)
Glucose, Bld: 90 mg/dL (ref 70–99)
Potassium: 3.5 mmol/L (ref 3.5–5.1)
Sodium: 140 mmol/L (ref 135–145)
Total Bilirubin: 0.3 mg/dL (ref 0.3–1.2)
Total Protein: 7.8 g/dL (ref 6.5–8.1)

## 2019-11-14 MED ORDER — HEPARIN SOD (PORK) LOCK FLUSH 100 UNIT/ML IV SOLN
500.0000 [IU] | Freq: Once | INTRAVENOUS | Status: AC | PRN
  Administered 2019-11-14: 15:00:00 500 [IU]
  Filled 2019-11-14: qty 5

## 2019-11-14 MED ORDER — ALTEPLASE 2 MG IJ SOLR
2.0000 mg | Freq: Once | INTRAMUSCULAR | Status: AC | PRN
  Administered 2019-11-14: 13:00:00 2 mg
  Filled 2019-11-14: qty 2

## 2019-11-14 MED ORDER — SODIUM CHLORIDE 0.9% FLUSH
10.0000 mL | Freq: Once | INTRAVENOUS | Status: AC
  Administered 2019-11-14: 10 mL via INTRAVENOUS
  Filled 2019-11-14: qty 10

## 2019-11-14 MED ORDER — SODIUM CHLORIDE 0.9 % IV SOLN
200.0000 mg | Freq: Once | INTRAVENOUS | Status: AC
  Administered 2019-11-14: 200 mg via INTRAVENOUS
  Filled 2019-11-14: qty 8

## 2019-11-14 MED ORDER — SODIUM CHLORIDE 0.9 % IV SOLN
Freq: Once | INTRAVENOUS | Status: AC
  Filled 2019-11-14: qty 250

## 2019-11-14 MED ORDER — OXYCODONE HCL 5 MG PO TABS: 5.0000 mg | ORAL_TABLET | Freq: Two times a day (BID) | ORAL | 0 refills | Status: DC | PRN

## 2019-11-14 MED ORDER — HEPARIN SOD (PORK) LOCK FLUSH 100 UNIT/ML IV SOLN
INTRAVENOUS | Status: AC
  Filled 2019-11-14: qty 5

## 2019-11-14 NOTE — Progress Notes (Signed)
Alteplase instilled at 1245. At 1315 no blood return noted, alteplase reinstilled per protocol. Will continue to assess.  1445 Successful blood return from port. Flushed and deaccessed. Patient discharged home

## 2019-11-14 NOTE — Progress Notes (Signed)
[place] OFFICE PROGRESS NOTE  Patient Care Team: Leonel Ramsay, MD as PCP - General (Infectious Diseases) Telford Nab, RN as Registered Nurse [name], MD as Medical Oncologist (Medical Oncology)  Cancer Staging No matching staging information was found for the patient.   Oncology History Overview Note  # RUL LUNG CANCER-non-small cell; favor squamous cell; PET scan T4N0 [right upper lobe mass involving[mediastinal/blood results] vs small pleural effusion [?M1]  #October 2020-CT progressive subcentimeter right-sided lung lesions;  # NOV 17th 2020- KEYTRUDA q3 W   # Hx of Right breast ca [s/p lumpec; RT; No chemo; "pill" x5 years]  # COPD/Hx of smoking  MOLECULAR TESTING/OMNISEQ: PDL-1 25% [TPS; TMB-H; No targets**]  --------------------------------------------------    DIAGNOSIS: [ April 2019]SQUAMOUS CELL LUNG CA  STAGE: IV/Recurrent ;GOALS: PALLIATIVE  CURRENT/MOST RECENT THERAPY- Keytruda [C] .     Primary cancer of right upper lobe of lung ([place])  03/13/2018 - 05/15/2018 Chemotherapy   The patient had palonosetron (ALOXI) injection 0.25 mg, 0.25 mg, Intravenous,  Once, 9 of 9 cycles Administration: 0.25 mg (03/13/2018), 0.25 mg (04/10/2018), 0.25 mg (04/17/2018), 0.25 mg (03/21/2018), 0.25 mg (04/25/2018), 0.25 mg (03/27/2018), 0.25 mg (04/03/2018), 0.25 mg (05/01/2018), 0.25 mg (05/08/2018) CARBOplatin (PARAPLATIN) 130 mg in sodium chloride 0.9 % 250 mL chemo infusion, 130 mg (100 % of original dose 131.4 mg), Intravenous,  Once, 9 of 9 cycles Dose modification:   (original dose 131.4 mg, Cycle 1) Administration: 130 mg (03/13/2018), 130 mg (04/10/2018), 130 mg (04/17/2018), 130 mg (03/21/2018), 130 mg (04/25/2018), 130 mg (03/27/2018), 130 mg (04/03/2018), 130 mg (05/01/2018), 130 mg (05/08/2018) PACLitaxel (TAXOL) 78 mg in sodium chloride 0.9 % 250 mL chemo infusion (</= 48m/m2), 45 mg/m2 = 78 mg, Intravenous,  Once, 9 of 9 cycles Administration: 78 mg  (03/13/2018), 78 mg (04/10/2018), 78 mg (04/17/2018), 78 mg (03/21/2018), 78 mg (04/25/2018), 78 mg (03/27/2018), 78 mg (04/03/2018), 78 mg (05/01/2018), 78 mg (05/08/2018)  for chemotherapy treatment.    09/12/2019 -  Chemotherapy   The patient had pembrolizumab (KEYTRUDA) 200 mg in sodium chloride 0.9 % 50 mL chemo infusion, 200 mg, Intravenous, Once, 4 of 6 cycles Administration: 200 mg (09/12/2019), 200 mg (10/03/2019), 200 mg (10/24/2019)  for chemotherapy treatment.        INTERVAL HISTORY:   Brenda [id number].[id number].  female patient with recurrent/metastatic squamous cell lung cancer-currently on KBeryle Flockis here for follow-up.  Patient is currently status post Keytruda cycle #3 appx 3 weeks ago.  Patient denies any worsening pain except for chronic mild right chest wall pain.  No nausea no vomiting no headaches.  Chronic mild shortness of breath cough.   Review of Systems  Constitutional: Positive for malaise/fatigue. Negative for chills, diaphoresis and fever.  HENT: Negative for nosebleeds and sore throat.   Eyes: Negative for double vision.  Respiratory: Positive for cough and shortness of breath. Negative for hemoptysis, sputum production and wheezing.   Cardiovascular: Negative for chest pain, palpitations and orthopnea.  Gastrointestinal: Negative for abdominal pain, blood in stool, heartburn, melena, nausea and vomiting.  Genitourinary: Negative for dysuria, frequency and urgency.  Musculoskeletal: Positive for joint pain. Negative for back pain.  Skin: Negative.  Negative for itching and rash.  Neurological: Negative for tingling, focal weakness, weakness and headaches.  Endo/Heme/Allergies: Does not bruise/bleed easily.  Psychiatric/Behavioral: Negative for depression. The patient is not nervous/anxious and does not have insomnia.     PAST MEDICAL HISTORY :  Past Medical History:  Diagnosis Date  . Benign liver cyst   . Breast cancer ([place]) 2005   right breast  . COPD  (chronic obstructive pulmonary disease) ([place])   . Dyspnea   . Glaucoma   . Hemoptysis 2019  . Hypertension    Not on medication for htn. patient denies this  . Lung mass 01/2018  . Personal history of radiation therapy     PAST SURGICAL HISTORY :   Past Surgical History:  Procedure Laterality Date  . BREAST EXCISIONAL BIOPSY Right 2005   positive, radiation  . BREAST LUMPECTOMY Right 03/2004  . CHOLECYSTECTOMY  1988  . COLONOSCOPY  2012  . ENDOBRONCHIAL ULTRASOUND N/A 02/21/2018   Procedure: ENDOBRONCHIAL ULTRASOUND;  Surgeon: Laverle Hobby, MD;  Location: [facility] ORS;  Service: Pulmonary;  Laterality: N/A;  . EYE SURGERY Bilateral 2009,2010   cataract extractions with lens implant  . GALLBLADDER SURGERY Bilateral   . IR FLUORO GUIDE PORT INSERTION RIGHT  03/10/2018  . JOINT REPLACEMENT Left 2008   left hip replacement  . PORTACATH PLACEMENT  03/10/2018  . TONSILLECTOMY  1960    FAMILY HISTORY :   Family History  Problem Relation Age of Onset  . Breast cancer Mother 90  . CAD Father     SOCIAL HISTORY:   Social History   Tobacco Use  . Smoking status: Former Smoker    Packs/day: 1.00    Types: Cigarettes    Quit date: 10/25/2005    Years since quitting: 14.0  . Smokeless tobacco: Never Used  Substance Use Topics  . Alcohol use: Not Currently    Comment: occasional glass of beer  . Drug use: Never    ALLERGIES:  is allergic to penicillins.  MEDICATIONS:  Current Outpatient Medications  Medication Sig Dispense Refill  . albuterol (PROVENTIL HFA) 108 (90 Base) MCG/ACT inhaler Inhale 2 puffs into the lungs every 6 (six) hours as needed for wheezing or shortness of breath.     Marland Kitchen BREO ELLIPTA 100-25 MCG/INH AEPB INL 1 PUFF ITL QD    . Cholecalciferol (VITAMIN D3) 1000 units CAPS Take 3,000 Units by mouth daily.     Marland Kitchen latanoprost (XALATAN) 0.005 % ophthalmic solution Apply 1 drop to eye daily.    . ondansetron (ZOFRAN) 8 MG tablet One pill every 8 hours as  needed for nausea/vomitting. 40 tablet 1  . traMADol (ULTRAM) 50 MG tablet Take 50 mg by mouth 2 (two) times daily as needed.    . TRELEGY ELLIPTA 100-62.5-25 MCG/INH AEPB     . oxyCODONE (OXY IR/ROXICODONE) 5 MG immediate release tablet Take 1 tablet (5 mg total) by mouth 2 (two) times daily as needed for moderate pain or severe pain. 30 tablet 0   No current facility-administered medications for this visit.   Facility-Administered Medications Ordered in Other Visits  Medication Dose Route Frequency Provider Last Rate Last Admin  . alteplase (CATHFLO ACTIVASE) injection 2 mg  2 mg Intracatheter Once PRN Charlaine Dalton R, MD      . heparin lock flush 100 unit/mL  500 Units Intracatheter Once PRN [name], MD      . pembrolizumab [facility]) 200 mg in sodium chloride 0.9 % 50 mL chemo infusion  200 mg Intravenous Once [name], MD        PHYSICAL EXAMINATION: ECOG PERFORMANCE STATUS: 1 - Symptomatic but completely ambulatory  BP 131/66 (BP Location: Right Arm, Patient Position: Sitting)   Pulse 92   Temp (!) 96.6 F (35.9 C) (  Tympanic)   Resp 20   Wt 147 lb (66.7 kg)   SpO2 99%   BMI 25.23 kg/m   Filed Weights   11/14/19 1041  Weight: 147 lb (66.7 kg)    Physical Exam  Constitutional: She is oriented to person, place, and time.  Frail-appearing Caucasian female patient..  She is walking herself.  Alone.  HENT:  Head: Normocephalic and atraumatic.  Mouth/Throat: Oropharynx is clear and moist. No oropharyngeal exudate.  Eyes: Pupils are equal, round, and reactive to light.  Cardiovascular: Normal rate and regular rhythm.  Pulmonary/Chest: No respiratory distress. She has no wheezes.  Decreased breath sounds in the right compared to the left.  Abdominal: Soft. Bowel sounds are normal. She exhibits no distension and no mass. There is no abdominal tenderness. There is no rebound and no guarding.  Musculoskeletal:        General: No tenderness or  edema. Normal range of motion.     Cervical back: Normal range of motion and neck supple.  Neurological: She is alert and oriented to person, place, and time.  Skin: Skin is warm.  Psychiatric: Affect normal.    LABORATORY DATA:  I have reviewed the data as listed    Component Value Date/Time   NA 140 11/14/2019 1030   K 3.5 11/14/2019 1030   CL 105 11/14/2019 1030   CO2 26 11/14/2019 1030   GLUCOSE 90 11/14/2019 1030   BUN 26 (H) 11/14/2019 1030   CREATININE 1.32 (H) 11/14/2019 1030   CALCIUM 9.1 11/14/2019 1030   PROT 7.8 11/14/2019 1030   ALBUMIN 3.1 (L) 11/14/2019 1030   AST 19 11/14/2019 1030   ALT 36 11/14/2019 1030   ALKPHOS 77 11/14/2019 1030   BILITOT 0.3 11/14/2019 1030   GFRNONAA 38 (L) 11/14/2019 1030   GFRAA 44 (L) 11/14/2019 1030    No results found for: SPEP, UPEP  Lab Results  Component Value Date   WBC 12.6 (H) 11/14/2019   NEUTROABS 9.8 (H) 11/14/2019   HGB 11.7 (L) 11/14/2019   HCT 39.2 11/14/2019   MCV 89.9 11/14/2019   PLT 348 11/14/2019      Chemistry      Component Value Date/Time   NA 140 11/14/2019 1030   K 3.5 11/14/2019 1030   CL 105 11/14/2019 1030   CO2 26 11/14/2019 1030   BUN 26 (H) 11/14/2019 1030   CREATININE 1.32 (H) 11/14/2019 1030      Component Value Date/Time   CALCIUM 9.1 11/14/2019 1030   ALKPHOS 77 11/14/2019 1030   AST 19 11/14/2019 1030   ALT 36 11/14/2019 1030   BILITOT 0.3 11/14/2019 1030       RADIOGRAPHIC STUDIES: I have personally reviewed the radiological images as listed and agreed with the findings in the report. No results found.   ASSESSMENT & PLAN:  Primary cancer of right upper lobe of lung ([place]) # Right upper lobe lung cancer-progressive/metastatic-stage IV- OCT 2020, 20th CT scan-overall stable right upper lung scarring/atelectasis; but middle lobe/lower lobe subcentimeter progressive disease.  Currently on keytruda.  Stable.  # Proceed with keytruda #4 today. Labs today reviewed;  acceptable  for treatment today. Will order scans today.  #Right chest wall pain-likely secondary to pleurisy/scar tissue chronic-stable  #COPD- STABLE; continue current medication/inhalers.   # Port malfunction: fibrin sheath-we will repeat TPA  # # I discussed regarding Covid-19 precautions.  I reviewed the vaccine effectiveness and potential side effects in detail.  Also discussed long-term effectiveness and safety  profile are unclear at this time.  I discussed December, 2020 ASCO position statement-that all patients are recommended COVID-19 vaccinations [when available]-as long as they do not have allergy to components of the vaccine.  However, I think the benefits of the vaccination outweigh the potential risks. Re: NVBTY-60 vaccination.  Wants to wait because of logistical issues.    # DISPOSITION: #  Keytruda today # Follow up in 3 weeks- MD; labs-cbc/cmp;TSH;Keytruda- CT chest prior-Dr.B      Orders Placed This Encounter  Procedures  . CT CHEST WO CONTRAST    Standing Status:   Future    Standing Expiration Date:   11/13/2020    Order Specific Question:   Preferred imaging location?    Answer:   [facility]    Order Specific Question:   Radiology Contrast Protocol - do NOT remove file path    Answer:   [email or web address]    Order Specific Question:   ** REASON FOR EXAM (FREE TEXT)    Answer:   lung cancer  . CBC with Differential    Standing Status:   Future    Standing Expiration Date:   11/13/2020  . Comprehensive metabolic panel    Standing Status:   Future    Standing Expiration Date:   11/13/2020  . TSH    Standing Status:   Future    Standing Expiration Date:   11/13/2020   All questions were answered. The patient knows to call the clinic with any problems, questions or concerns.      [name], MD 11/14/2019 11:31 AM

## 2019-11-14 NOTE — Assessment & Plan Note (Addendum)
#   Right upper lobe lung cancer-progressive/metastatic-stage IV- OCT 2020, 20th CT scan-overall stable right upper lung scarring/atelectasis; but middle lobe/lower lobe subcentimeter progressive disease.  Currently on keytruda.  Stable.  # Proceed with keytruda #4 today. Labs today reviewed;  acceptable for treatment today. Will order scans today.  #Right chest wall pain-likely secondary to pleurisy/scar tissue chronic-stable  #COPD- STABLE; continue current medication/inhalers.   # Port malfunction: fibrin sheath-we will repeat TPA  # # I discussed regarding Covid-19 precautions.  I reviewed the vaccine effectiveness and potential side effects in detail.  Also discussed long-term effectiveness and safety profile are unclear at this time.  I discussed December, 2020 ASCO position statement-that all patients are recommended COVID-19 vaccinations [when available]-as long as they do not have allergy to components of the vaccine.  However, I think the benefits of the vaccination outweigh the potential risks. Re: QHUTM-54 vaccination.  Wants to wait because of logistical issues.    # DISPOSITION: #  Keytruda today # Follow up in 3 weeks- MD; labs-cbc/cmp;TSH;Keytruda- CT chest prior-Dr.B

## 2019-11-26 ENCOUNTER — Ambulatory Visit: Payer: Medicare Other | Admitting: Radiation Oncology

## 2019-11-30 ENCOUNTER — Ambulatory Visit
Admission: RE | Admit: 2019-11-30 | Discharge: 2019-11-30 | Disposition: A | Discharge status: Home / Self Care | Payer: Medicare Other | Source: Ambulatory Visit | Attending: Internal Medicine | Admitting: Internal Medicine

## 2019-11-30 ENCOUNTER — Other Ambulatory Visit: Payer: Self-pay

## 2019-11-30 DIAGNOSIS — C3411 Malignant neoplasm of upper lobe, right bronchus or lung: Secondary | ICD-10-CM | POA: Insufficient documentation

## 2019-12-04 ENCOUNTER — Other Ambulatory Visit: Payer: Self-pay

## 2019-12-05 ENCOUNTER — Inpatient Hospital Stay (HOSPITAL_BASED_OUTPATIENT_CLINIC_OR_DEPARTMENT_OTHER): Payer: Medicare Other | Admitting: Internal Medicine

## 2019-12-05 ENCOUNTER — Other Ambulatory Visit: Payer: Self-pay

## 2019-12-05 ENCOUNTER — Inpatient Hospital Stay: Payer: Medicare Other | Attending: Internal Medicine | Admitting: *Deleted

## 2019-12-05 ENCOUNTER — Inpatient Hospital Stay: Payer: Medicare Other

## 2019-12-05 ENCOUNTER — Encounter: Payer: Self-pay | Admitting: Radiation Oncology

## 2019-12-05 ENCOUNTER — Ambulatory Visit
Admission: RE | Admit: 2019-12-05 | Discharge: 2019-12-05 | Disposition: A | Discharge status: Home / Self Care | Payer: Medicare Other | Source: Ambulatory Visit | Attending: Radiation Oncology | Admitting: Radiation Oncology

## 2019-12-05 VITALS — BP 133/83 | HR 97 | Temp 97.5°F | Resp 20 | Wt 146.7 lb

## 2019-12-05 DIAGNOSIS — C3411 Malignant neoplasm of upper lobe, right bronchus or lung: Secondary | ICD-10-CM | POA: Insufficient documentation

## 2019-12-05 DIAGNOSIS — Z923 Personal history of irradiation: Secondary | ICD-10-CM | POA: Insufficient documentation

## 2019-12-05 DIAGNOSIS — J449 Chronic obstructive pulmonary disease, unspecified: Secondary | ICD-10-CM | POA: Insufficient documentation

## 2019-12-05 DIAGNOSIS — Z87891 Personal history of nicotine dependence: Secondary | ICD-10-CM | POA: Insufficient documentation

## 2019-12-05 DIAGNOSIS — R0789 Other chest pain: Secondary | ICD-10-CM | POA: Diagnosis not present

## 2019-12-05 DIAGNOSIS — Z79899 Other long term (current) drug therapy: Secondary | ICD-10-CM | POA: Insufficient documentation

## 2019-12-05 DIAGNOSIS — Z9221 Personal history of antineoplastic chemotherapy: Secondary | ICD-10-CM | POA: Insufficient documentation

## 2019-12-05 DIAGNOSIS — Z95828 Presence of other vascular implants and grafts: Secondary | ICD-10-CM

## 2019-12-05 LAB — COMPREHENSIVE METABOLIC PANEL
ALT: 11 U/L (ref 0–44)
AST: 15 U/L (ref 15–41)
Albumin: 3.2 g/dL — ABNORMAL LOW (ref 3.5–5.0)
Alkaline Phosphatase: 85 U/L (ref 38–126)
Anion gap: 11 (ref 5–15)
BUN: 21 mg/dL (ref 8–23)
CO2: 22 mmol/L (ref 22–32)
Calcium: 9.3 mg/dL (ref 8.9–10.3)
Chloride: 103 mmol/L (ref 98–111)
Creatinine, Ser: 1.23 mg/dL — ABNORMAL HIGH (ref 0.44–1.00)
GFR calc Af Amer: 48 mL/min — ABNORMAL LOW (ref >60)
GFR calc non Af Amer: 41 mL/min — ABNORMAL LOW (ref >60)
Glucose, Bld: 99 mg/dL (ref 70–99)
Potassium: 3.9 mmol/L (ref 3.5–5.1)
Sodium: 136 mmol/L (ref 135–145)
Total Bilirubin: 0.5 mg/dL (ref 0.3–1.2)
Total Protein: 8.4 g/dL — ABNORMAL HIGH (ref 6.5–8.1)

## 2019-12-05 LAB — CBC WITH DIFFERENTIAL/PLATELET
Abs Immature Granulocytes: 0.04 10*3/uL (ref 0.00–0.07)
Basophils Absolute: 0.1 10*3/uL (ref 0.0–0.1)
Basophils Relative: 1 %
Eosinophils Absolute: 0.4 10*3/uL (ref 0.0–0.5)
Eosinophils Relative: 4 %
HCT: 35.9 % — ABNORMAL LOW (ref 36.0–46.0)
Hemoglobin: 10.9 g/dL — ABNORMAL LOW (ref 12.0–15.0)
Immature Granulocytes: 0 %
Lymphocytes Relative: 7 %
Lymphs Abs: 0.7 10*3/uL (ref 0.7–4.0)
MCH: 26.7 pg (ref 26.0–34.0)
MCHC: 30.4 g/dL (ref 30.0–36.0)
MCV: 88 fL (ref 80.0–100.0)
Monocytes Absolute: 0.8 10*3/uL (ref 0.1–1.0)
Monocytes Relative: 9 %
Neutro Abs: 7 10*3/uL (ref 1.7–7.7)
Neutrophils Relative %: 79 %
Platelets: 377 10*3/uL (ref 150–400)
RBC: 4.08 MIL/uL (ref 3.87–5.11)
RDW: 16 % — ABNORMAL HIGH (ref 11.5–15.5)
WBC: 8.9 10*3/uL (ref 4.0–10.5)
nRBC: 0 % (ref 0.0–0.2)

## 2019-12-05 LAB — TSH: TSH: 0.973 u[IU]/mL (ref 0.350–4.500)

## 2019-12-05 MED ORDER — SODIUM CHLORIDE 0.9% FLUSH
10.0000 mL | Freq: Once | INTRAVENOUS | Status: AC
  Administered 2019-12-05: 11:00:00 10 mL via INTRAVENOUS
  Filled 2019-12-05: qty 10

## 2019-12-05 MED ORDER — HEPARIN SOD (PORK) LOCK FLUSH 100 UNIT/ML IV SOLN
INTRAVENOUS | Status: AC
  Filled 2019-12-05: qty 5

## 2019-12-05 MED ORDER — HEPARIN SOD (PORK) LOCK FLUSH 100 UNIT/ML IV SOLN
500.0000 [IU] | Freq: Once | INTRAVENOUS | Status: AC
  Administered 2019-12-05: 12:00:00 500 [IU]
  Filled 2019-12-05: qty 5

## 2019-12-05 NOTE — Progress Notes (Signed)
DISCONTINUE OFF PATHWAY REGIMEN - Non-Small Cell Lung   [id number]:Pembrolizumab 200 mg q21 Days:   A cycle is 21 days:     Pembrolizumab   **Always confirm dose/schedule in your pharmacy ordering system**  REASON: Disease Progression PRIOR TREATMENT: Off Pathway: Pembrolizumab 200 mg q21 Days TREATMENT RESPONSE: Progressive Disease (PD)  START ON PATHWAY REGIMEN - Non-Small Cell Lung     A cycle is every 21 days:     Paclitaxel      Carboplatin   **Always confirm dose/schedule in your pharmacy ordering system**  Patient Characteristics: Stage IV Metastatic, Squamous, PS = 0, 1, Second Line, Prior PD-1/PD-L1 Inhibitor as Monotherapy AJCC T Category: T4 Current Disease Status: Distant Metastases AJCC N Category: N0 AJCC M Category: M1a AJCC 8 Stage Grouping: IVA Histology: Squamous Cell Line of therapy: Second Line ECOG Performance Status: 1 PD-L1 Expression Status: PD-L1 Positive 1-49% (TPS) Immunotherapy Candidate Status: Not a Candidate for Immunotherapy Prior Immunotherapy Status: Prior PD-1/PD-L1 Inhibitor as Monotherapy Intent of Therapy: Non-Curative / Palliative Intent, Discussed with Patient

## 2019-12-05 NOTE — Assessment & Plan Note (Addendum)
#   Right upper lobe lung cancer-progressive/metastatic-stage IV; FEB 5th 2021-  CT scan chest-scarring right upper lobe stable; however progressive worsening lesions noted right lower lobe ~centimeter in size; multiple.  #Reviewed the imaging with the patient and son-given the progression of disease I would recommend discontinuation of Keytruda.  #Discussed the alternative options including-switching to alternative chemotherapy like carbo-taxol vs- supportive care/palliative care treatment.  Also discussed with patient/son regarding the natural history of disease progression-symptoms involved with disease progression etc.  # Discussed treatments are palliative not curative discussed the potential increased side effects. Discussed the potential side effects including but not limited to-increasing fatigue, nausea vomiting, diarrhea, hair loss, sores in the mouth, increase risk of infection and also neuropathy.   #Right chest wall pain-likely secondary to pleurisy/scar tissue chronic-stable  #COPD- STABLE; continue current medication/inhalers.   # # Palliative care evaluation: Introduced palliative care philosophy and services. I discussed the need for palliative care evaluation/symptom management to help quality of life in the context of incurable disease.  Patient is interested; will make referral.  # DISPOSITION: #  HOLD keytruda; de-access # referral Palliative care/Josh- # in 2 weeks; MD- Labs- Cbc/cmp; Carbo-Taxol weekly- Dr.B  # I reviewed the blood work- with the patient in detail; also reviewed the imaging independently [as summarized above]; and with the patient in detail.

## 2019-12-05 NOTE — Progress Notes (Signed)
OFF PATHWAY REGIMEN - Non-Small Cell Lung  No Change  Continue With Treatment as Ordered.   [id number]:Pembrolizumab 200 mg q21 Days:   A cycle is 21 days:     Pembrolizumab   **Always confirm dose/schedule in your pharmacy ordering system**  Patient Characteristics: Stage IV and Local Recurrence AJCC T Category: T4 Current Disease Status: Local Recurrence AJCC N Category: N0 AJCC M Category: M1a AJCC 8 Stage Grouping: IVA Intent of Therapy: Non-Curative / Palliative Intent, Discussed with Patient

## 2019-12-05 NOTE — Progress Notes (Signed)
[place] OFFICE PROGRESS NOTE  Patient Care Team: Leonel Ramsay, MD as PCP - General (Infectious Diseases) Telford Nab, RN as Registered Nurse [name], MD as Medical Oncologist (Medical Oncology)  Cancer Staging No matching staging information was found for the patient.   Oncology History Overview Note  # April 2019- RUL LUNG CANCER-non-small cell; favor squamous cell; PET scan T4N0 [right upper lobe mass involving[mediastinal/blood results] vs small pleural effusion [?M1]- CARBO-TAXOL- RT;   #October 2020-CT progressive subcentimeter right-sided lung lesions;  # NOV 17th 2020- KEYTRUDA q3 W  # End of FEB 2021-   # Hx of Right breast ca [s/p lumpec; RT; No chemo; "pill" x5 years]  # COPD/Hx of smoking  MOLECULAR TESTING/OMNISEQ: PDL-1 25% [TPS; TMB-H; No targets**]  --------------------------------------------------    DIAGNOSIS: [ April 2019]SQUAMOUS CELL LUNG CA  STAGE: IV/Recurrent ;GOALS: PALLIATIVE  CURRENT/MOST RECENT THERAPY- carbo-Taxol .     Primary cancer of right upper lobe of lung ([place])  03/13/2018 - 05/15/2018 Chemotherapy   The patient had palonosetron (ALOXI) injection 0.25 mg, 0.25 mg, Intravenous,  Once, 9 of 9 cycles Administration: 0.25 mg (03/13/2018), 0.25 mg (04/10/2018), 0.25 mg (04/17/2018), 0.25 mg (03/21/2018), 0.25 mg (04/25/2018), 0.25 mg (03/27/2018), 0.25 mg (04/03/2018), 0.25 mg (05/01/2018), 0.25 mg (05/08/2018) CARBOplatin (PARAPLATIN) 130 mg in sodium chloride 0.9 % 250 mL chemo infusion, 130 mg (100 % of original dose 131.4 mg), Intravenous,  Once, 9 of 9 cycles Dose modification:   (original dose 131.4 mg, Cycle 1) Administration: 130 mg (03/13/2018), 130 mg (04/10/2018), 130 mg (04/17/2018), 130 mg (03/21/2018), 130 mg (04/25/2018), 130 mg (03/27/2018), 130 mg (04/03/2018), 130 mg (05/01/2018), 130 mg (05/08/2018) PACLitaxel (TAXOL) 78 mg in sodium chloride 0.9 % 250 mL chemo infusion (</= 85m/m2), 45 mg/m2 = 78 mg,  Intravenous,  Once, 9 of 9 cycles Administration: 78 mg (03/13/2018), 78 mg (04/10/2018), 78 mg (04/17/2018), 78 mg (03/21/2018), 78 mg (04/25/2018), 78 mg (03/27/2018), 78 mg (04/03/2018), 78 mg (05/01/2018), 78 mg (05/08/2018)  for chemotherapy treatment.    09/12/2019 - 12/04/2019 Chemotherapy   The patient had pembrolizumab (KEYTRUDA) 200 mg in sodium chloride 0.9 % 50 mL chemo infusion, 200 mg, Intravenous, Once, 4 of 6 cycles Administration: 200 mg (09/12/2019), 200 mg (10/03/2019), 200 mg (10/24/2019), 200 mg (11/14/2019)  for chemotherapy treatment.        INTERVAL HISTORY:   Brenda WESTERGAARD89y.o.  female patient with recurrent/metastatic squamous cell lung cancer-currently on KBeryle Flockis here for follow-up/review results of the CT scan.  Patient is currently status post 4 cycles of Keytruda.  No nausea no vomiting no diarrhea.  Chronic mild shortness of breath; no worsening cough.  Review of Systems  Constitutional: Positive for malaise/fatigue. Negative for chills, diaphoresis and fever.  HENT: Negative for nosebleeds and sore throat.   Eyes: Negative for double vision.  Respiratory: Positive for cough and shortness of breath. Negative for hemoptysis, sputum production and wheezing.   Cardiovascular: Negative for chest pain, palpitations and orthopnea.  Gastrointestinal: Negative for abdominal pain, blood in stool, heartburn, melena, nausea and vomiting.  Genitourinary: Negative for dysuria, frequency and urgency.  Musculoskeletal: Positive for joint pain. Negative for back pain.  Skin: Negative.  Negative for itching and rash.  Neurological: Negative for tingling, focal weakness, weakness and headaches.  Endo/Heme/Allergies: Does not bruise/bleed easily.  Psychiatric/Behavioral: Negative for depression. The patient is not nervous/anxious and does not have insomnia.     PAST MEDICAL HISTORY :  Past Medical  History:  Diagnosis Date  . Benign liver cyst   . Breast cancer ([place]) 2005    right breast  . COPD (chronic obstructive pulmonary disease) ([place])   . Dyspnea   . Glaucoma   . Hemoptysis 2019  . Hypertension    Not on medication for htn. patient denies this  . Lung mass 01/2018  . Personal history of radiation therapy     PAST SURGICAL HISTORY :   Past Surgical History:  Procedure Laterality Date  . BREAST EXCISIONAL BIOPSY Right 2005   positive, radiation  . BREAST LUMPECTOMY Right 03/2004  . CHOLECYSTECTOMY  1988  . COLONOSCOPY  2012  . ENDOBRONCHIAL ULTRASOUND N/A 02/21/2018   Procedure: ENDOBRONCHIAL ULTRASOUND;  Surgeon: Laverle Hobby, MD;  Location: [facility] ORS;  Service: Pulmonary;  Laterality: N/A;  . EYE SURGERY Bilateral 2009,2010   cataract extractions with lens implant  . GALLBLADDER SURGERY Bilateral   . IR FLUORO GUIDE PORT INSERTION RIGHT  03/10/2018  . JOINT REPLACEMENT Left 2008   left hip replacement  . PORTACATH PLACEMENT  03/10/2018  . TONSILLECTOMY  1960    FAMILY HISTORY :   Family History  Problem Relation Age of Onset  . Breast cancer Mother 77  . CAD Father     SOCIAL HISTORY:   Social History   Tobacco Use  . Smoking status: Former Smoker    Packs/day: 1.00    Types: Cigarettes    Quit date: 10/25/2005    Years since quitting: 14.1  . Smokeless tobacco: Never Used  Substance Use Topics  . Alcohol use: Not Currently    Comment: occasional glass of beer  . Drug use: Never    ALLERGIES:  is allergic to penicillins.  MEDICATIONS:  Current Outpatient Medications  Medication Sig Dispense Refill  . albuterol (PROVENTIL HFA) 108 (90 Base) MCG/ACT inhaler Inhale 2 puffs into the lungs every 6 (six) hours as needed for wheezing or shortness of breath.     . Cholecalciferol (VITAMIN D3) 1000 units CAPS Take 3,000 Units by mouth daily.     Marland Kitchen latanoprost (XALATAN) 0.005 % ophthalmic solution Apply 1 drop to eye daily.    . TRELEGY ELLIPTA 100-62.5-25 MCG/INH AEPB     . ondansetron (ZOFRAN) 8 MG tablet One pill every 8  hours as needed for nausea/vomitting. 40 tablet 1  . oxyCODONE (OXY IR/ROXICODONE) 5 MG immediate release tablet Take 1 tablet (5 mg total) by mouth 2 (two) times daily as needed for moderate pain or severe pain. 30 tablet 0  . traMADol (ULTRAM) 50 MG tablet Take 50 mg by mouth 2 (two) times daily as needed.     No current facility-administered medications for this visit.    PHYSICAL EXAMINATION: ECOG PERFORMANCE STATUS: 1 - Symptomatic but completely ambulatory  BP 133/83   Pulse 97   Temp (!) 97.5 F (36.4 C) (Tympanic)   Wt 146 lb (66.2 kg)   BMI 25.06 kg/m   Filed Weights   12/05/19 1107  Weight: 146 lb (66.2 kg)    Physical Exam  Constitutional: She is oriented to person, place, and time.  Frail-appearing Caucasian female patient..  She is walking herself.  Alone.  HENT:  Head: Normocephalic and atraumatic.  Mouth/Throat: Oropharynx is clear and moist. No oropharyngeal exudate.  Eyes: Pupils are equal, round, and reactive to light.  Cardiovascular: Normal rate and regular rhythm.  Pulmonary/Chest: No respiratory distress. She has no wheezes.  Decreased breath sounds in the right compared to the left.  Abdominal: Soft. Bowel sounds are normal. She exhibits no distension and no mass. There is no abdominal tenderness. There is no rebound and no guarding.  Musculoskeletal:        General: No tenderness or edema. Normal range of motion.     Cervical back: Normal range of motion and neck supple.  Neurological: She is alert and oriented to person, place, and time.  Skin: Skin is warm.  Psychiatric: Affect normal.    LABORATORY DATA:  I have reviewed the data as listed    Component Value Date/Time   NA 136 12/05/2019 1055   K 3.9 12/05/2019 1055   CL 103 12/05/2019 1055   CO2 22 12/05/2019 1055   GLUCOSE 99 12/05/2019 1055   BUN 21 12/05/2019 1055   CREATININE 1.23 (H) 12/05/2019 1055   CALCIUM 9.3 12/05/2019 1055   PROT 8.4 (H) 12/05/2019 1055   ALBUMIN 3.2 (L)  12/05/2019 1055   AST 15 12/05/2019 1055   ALT 11 12/05/2019 1055   ALKPHOS 85 12/05/2019 1055   BILITOT 0.5 12/05/2019 1055   GFRNONAA 41 (L) 12/05/2019 1055   GFRAA 48 (L) 12/05/2019 1055    No results found for: SPEP, UPEP  Lab Results  Component Value Date   WBC 8.9 12/05/2019   NEUTROABS 7.0 12/05/2019   HGB 10.9 (L) 12/05/2019   HCT 35.9 (L) 12/05/2019   MCV 88.0 12/05/2019   PLT 377 12/05/2019      Chemistry      Component Value Date/Time   NA 136 12/05/2019 1055   K 3.9 12/05/2019 1055   CL 103 12/05/2019 1055   CO2 22 12/05/2019 1055   BUN 21 12/05/2019 1055   CREATININE 1.23 (H) 12/05/2019 1055      Component Value Date/Time   CALCIUM 9.3 12/05/2019 1055   ALKPHOS 85 12/05/2019 1055   AST 15 12/05/2019 1055   ALT 11 12/05/2019 1055   BILITOT 0.5 12/05/2019 1055       RADIOGRAPHIC STUDIES: I have personally reviewed the radiological images as listed and agreed with the findings in the report. No results found.   ASSESSMENT & PLAN:  Primary cancer of right upper lobe of lung ([place]) # Right upper lobe lung cancer-progressive/metastatic-stage IV; FEB 5th 2021-  CT scan chest-scarring right upper lobe stable; however progressive worsening lesions noted right lower lobe ~centimeter in size; multiple.  #Reviewed the imaging with the patient and son-given the progression of disease I would recommend discontinuation of Keytruda.  #Discussed the alternative options including-switching to alternative chemotherapy like carbo-taxol vs- supportive care/palliative care treatment.   # Discussed treatments are palliative not curative discussed the potential increased side effects. Discussed the potential side effects including but not limited to-increasing fatigue, nausea vomiting, diarrhea, hair loss, sores in the mouth, increase risk of infection and also neuropathy.   #Right chest wall pain-likely secondary to pleurisy/scar tissue chronic-stable  #COPD- STABLE;  continue current medication/inhalers.   # # Palliative care evaluation: Introduced palliative care philosophy and services. I discussed the need for palliative care evaluation/symptom management to help quality of life in the context of incurable disease.  Patient is interested; will make referral.  # DISPOSITION: #  HOLD keytruda; de-access # referral Palliative care/Josh- # in 2 weeks; MD- Labs- Cbc/cmp; Carbo-Taxol weekly- Dr.B  # I reviewed the blood work- with the patient in detail; also reviewed the imaging independently [as summarized above]; and with the patient in detail.        Orders Placed  This Encounter  Procedures  . CBC with Differential    Standing Status:   Future    Standing Expiration Date:   12/04/2020  . Comprehensive metabolic panel    Standing Status:   Future    Standing Expiration Date:   12/04/2020   All questions were answered. The patient knows to call the clinic with any problems, questions or concerns.      [name], MD 12/05/2019 12:48 PM

## 2019-12-05 NOTE — Progress Notes (Signed)
Radiation Oncology Follow up Note  Name: Brenda Parker   Date:   12/05/2019 MRN:  [id number] DOB: 01-29-1936    This 84 y.o. female presents to the clinic today for 54-month follow-up status post concurrent chemoradiation therapy for stage III non-small cell lung cancer of the right lung.  REFERRING PROVIDER: Leonel Ramsay, MD  HPI: Patient is an 84 year old female now seen at 17 months having completed concurrent chemoradiation therapy of the right lung for stage III non-small cell lung cancer.  Repeat CT scans have shown progression of disease with multiple pulmonary nodules and she has been on Keytruda although recent CT scan shows progression of disease and Beryle Flock has been discontinued.  She is being referred to palliative care at this time.  Her most recent CT scan shows progression of disease in multiple pulmonary nodules.  She specifically denies hemoptysis significant cough or dysphagia.  COMPLICATIONS OF TREATMENT: none  FOLLOW UP COMPLIANCE: keeps appointments   PHYSICAL EXAM:  BP 133/83 (BP Location: Right Arm, Patient Position: Sitting, Cuff Size: Small)   Pulse 97   Temp (!) 97.5 F (36.4 C) (Tympanic)   Resp 20   Wt 146 lb 11.2 oz (66.5 kg)   BMI 25.18 kg/m  Well-developed well-nourished patient in NAD. HEENT reveals PERLA, EOMI, discs not visualized.  Oral cavity is clear. No oral mucosal lesions are identified. Neck is clear without evidence of cervical or supraclavicular adenopathy. Lungs are clear to A&P. Cardiac examination is essentially unremarkable with regular rate and rhythm without murmur rub or thrill. Abdomen is benign with no organomegaly or masses noted. Motor sensory and DTR levels are equal and symmetric in the upper and lower extremities. Cranial nerves II through XII are grossly intact. Proprioception is intact. No peripheral adenopathy or edema is identified. No motor or sensory levels are noted. Crude visual fields are within normal  range.  RADIOLOGY RESULTS: CT scans reviewed compatible with above-stated findings  PLAN: At this time I have asked to see her back in 6 months.  She knows to call with any indications for palliative treatment.  Indication for palliative treatment be increasing shortness of breath hemoptysis or persistent cough.  She has been discontinued her Keytruda by Dr. Jacinto Reap.  I will be happy to reevaluate the patient anytime should she need palliative treatment sooner.  I would like to take this opportunity to thank you for allowing me to participate in the care of your patient.Noreene Filbert, MD

## 2019-12-14 ENCOUNTER — Other Ambulatory Visit: Payer: Self-pay | Admitting: Internal Medicine

## 2019-12-14 NOTE — Progress Notes (Signed)
ON PATHWAY REGIMEN - Non-Small Cell Lung  No Change  Continue With Treatment as Ordered.     A cycle is every 21 days:     Paclitaxel      Carboplatin   **Always confirm dose/schedule in your pharmacy ordering system**  Patient Characteristics: Stage IV Metastatic, Squamous, PS = 0, 1, Second Line, Prior PD-1/PD-L1 Inhibitor as Monotherapy AJCC T Category: T4 Current Disease Status: Distant Metastases AJCC N Category: N0 AJCC M Category: M1a AJCC 8 Stage Grouping: IVA Histology: Squamous Cell Line of therapy: Second Line ECOG Performance Status: 1 PD-L1 Expression Status: PD-L1 Positive 1-49% (TPS) Immunotherapy Candidate Status: Not a Candidate for Immunotherapy Prior Immunotherapy Status: Prior PD-1/PD-L1 Inhibitor as Monotherapy Intent of Therapy: Non-Curative / Palliative Intent, Discussed with Patient

## 2019-12-19 ENCOUNTER — Inpatient Hospital Stay: Payer: Medicare Other | Admitting: Hospice and Palliative Medicine

## 2019-12-19 ENCOUNTER — Other Ambulatory Visit: Payer: Self-pay

## 2019-12-19 ENCOUNTER — Inpatient Hospital Stay (HOSPITAL_BASED_OUTPATIENT_CLINIC_OR_DEPARTMENT_OTHER): Payer: Medicare Other | Admitting: Internal Medicine

## 2019-12-19 ENCOUNTER — Inpatient Hospital Stay: Payer: Medicare Other | Admitting: *Deleted

## 2019-12-19 ENCOUNTER — Inpatient Hospital Stay: Payer: Medicare Other

## 2019-12-19 VITALS — BP 147/80 | HR 89 | Temp 97.6°F | Resp 20 | Ht 64.0 in | Wt 147.0 lb

## 2019-12-19 VITALS — BP 124/81 | HR 83 | Temp 98.0°F | Resp 18

## 2019-12-19 DIAGNOSIS — Z95828 Presence of other vascular implants and grafts: Secondary | ICD-10-CM

## 2019-12-19 DIAGNOSIS — C3411 Malignant neoplasm of upper lobe, right bronchus or lung: Secondary | ICD-10-CM

## 2019-12-19 DIAGNOSIS — Z7189 Other specified counseling: Secondary | ICD-10-CM

## 2019-12-19 LAB — CBC WITH DIFFERENTIAL/PLATELET
Abs Immature Granulocytes: 0.05 10*3/uL (ref 0.00–0.07)
Basophils Absolute: 0.1 10*3/uL (ref 0.0–0.1)
Basophils Relative: 1 %
Eosinophils Absolute: 0.4 10*3/uL (ref 0.0–0.5)
Eosinophils Relative: 5 %
HCT: 35.8 % — ABNORMAL LOW (ref 36.0–46.0)
Hemoglobin: 10.7 g/dL — ABNORMAL LOW (ref 12.0–15.0)
Immature Granulocytes: 1 %
Lymphocytes Relative: 6 %
Lymphs Abs: 0.6 10*3/uL — ABNORMAL LOW (ref 0.7–4.0)
MCH: 26.4 pg (ref 26.0–34.0)
MCHC: 29.9 g/dL — ABNORMAL LOW (ref 30.0–36.0)
MCV: 88.2 fL (ref 80.0–100.0)
Monocytes Absolute: 0.7 10*3/uL (ref 0.1–1.0)
Monocytes Relative: 8 %
Neutro Abs: 7.2 10*3/uL (ref 1.7–7.7)
Neutrophils Relative %: 79 %
Platelets: 347 10*3/uL (ref 150–400)
RBC: 4.06 MIL/uL (ref 3.87–5.11)
RDW: 16 % — ABNORMAL HIGH (ref 11.5–15.5)
WBC: 9 10*3/uL (ref 4.0–10.5)
nRBC: 0 % (ref 0.0–0.2)

## 2019-12-19 LAB — COMPREHENSIVE METABOLIC PANEL
ALT: 9 U/L (ref 0–44)
AST: 12 U/L — ABNORMAL LOW (ref 15–41)
Albumin: 3.2 g/dL — ABNORMAL LOW (ref 3.5–5.0)
Alkaline Phosphatase: 73 U/L (ref 38–126)
Anion gap: 10 (ref 5–15)
BUN: 17 mg/dL (ref 8–23)
CO2: 23 mmol/L (ref 22–32)
Calcium: 9.2 mg/dL (ref 8.9–10.3)
Chloride: 104 mmol/L (ref 98–111)
Creatinine, Ser: 1.28 mg/dL — ABNORMAL HIGH (ref 0.44–1.00)
GFR calc Af Amer: 45 mL/min — ABNORMAL LOW (ref >60)
GFR calc non Af Amer: 39 mL/min — ABNORMAL LOW (ref >60)
Glucose, Bld: 109 mg/dL — ABNORMAL HIGH (ref 70–99)
Potassium: 3.9 mmol/L (ref 3.5–5.1)
Sodium: 137 mmol/L (ref 135–145)
Total Bilirubin: 0.4 mg/dL (ref 0.3–1.2)
Total Protein: 8.1 g/dL (ref 6.5–8.1)

## 2019-12-19 MED ORDER — SODIUM CHLORIDE 0.9 % IV SOLN
Freq: Once | INTRAVENOUS | Status: AC
  Filled 2019-12-19: qty 250

## 2019-12-19 MED ORDER — FAMOTIDINE IN NACL 20-0.9 MG/50ML-% IV SOLN
20.0000 mg | Freq: Once | INTRAVENOUS | Status: AC
  Administered 2019-12-19: 20 mg via INTRAVENOUS
  Filled 2019-12-19: qty 50

## 2019-12-19 MED ORDER — SODIUM CHLORIDE 0.9 % IV SOLN
122.0000 mg | Freq: Once | INTRAVENOUS | Status: AC
  Administered 2019-12-19: 120 mg via INTRAVENOUS
  Filled 2019-12-19: qty 12

## 2019-12-19 MED ORDER — HEPARIN SOD (PORK) LOCK FLUSH 100 UNIT/ML IV SOLN
INTRAVENOUS | Status: AC
  Filled 2019-12-19: qty 5

## 2019-12-19 MED ORDER — PALONOSETRON HCL INJECTION 0.25 MG/5ML
0.2500 mg | Freq: Once | INTRAVENOUS | Status: AC
  Administered 2019-12-19: 0.25 mg via INTRAVENOUS
  Filled 2019-12-19: qty 5

## 2019-12-19 MED ORDER — HEPARIN SOD (PORK) LOCK FLUSH 100 UNIT/ML IV SOLN
500.0000 [IU] | Freq: Once | INTRAVENOUS | Status: AC | PRN
  Administered 2019-12-19: 500 [IU]
  Filled 2019-12-19: qty 5

## 2019-12-19 MED ORDER — SODIUM CHLORIDE 0.9 % IV SOLN
20.0000 mg | Freq: Once | INTRAVENOUS | Status: AC
  Administered 2019-12-19: 20 mg via INTRAVENOUS
  Filled 2019-12-19: qty 20

## 2019-12-19 MED ORDER — SODIUM CHLORIDE 0.9% FLUSH
10.0000 mL | Freq: Once | INTRAVENOUS | Status: AC
  Administered 2019-12-19: 10 mL via INTRAVENOUS
  Filled 2019-12-19: qty 10

## 2019-12-19 MED ORDER — SODIUM CHLORIDE 0.9 % IV SOLN
80.0000 mg/m2 | Freq: Once | INTRAVENOUS | Status: AC
  Administered 2019-12-19: 138 mg via INTRAVENOUS
  Filled 2019-12-19: qty 23

## 2019-12-19 MED ORDER — DIPHENHYDRAMINE HCL 50 MG/ML IJ SOLN
50.0000 mg | Freq: Once | INTRAMUSCULAR | Status: AC
  Administered 2019-12-19: 50 mg via INTRAVENOUS
  Filled 2019-12-19: qty 1

## 2019-12-19 NOTE — Progress Notes (Signed)
[place] OFFICE PROGRESS NOTE  Patient Care Team: Leonel Ramsay, MD as PCP - General (Infectious Diseases) Telford Nab, RN as Registered Nurse [name], MD as Medical Oncologist (Medical Oncology)  Cancer Staging No matching staging information was found for the patient.   Oncology History Overview Note  # April 2019- RUL LUNG CANCER-non-small cell; favor squamous cell; PET scan T4N0 [right upper lobe mass involving[mediastinal/blood results] vs small pleural effusion [?M1]- CARBO-TAXOL- RT;   #October 2020-CT progressive subcentimeter right-sided lung lesions;  # NOV 17th 2020- KEYTRUDA q3 W  # 24th FEB 2021- Carbo-Taxol weekly-   # Hx of Right breast ca [s/p lumpec; RT; No chemo; "pill" x5 years]  # COPD/Hx of smoking  # PALLIATIVE CARE: 12/19/2019- DELCINED  MOLECULAR TESTING/OMNISEQ: PDL-1 25% [TPS; TMB-H; No targets**]  --------------------------------------------------    DIAGNOSIS: [ April 2019]SQUAMOUS CELL LUNG CA  STAGE: IV/Recurrent ;GOALS: PALLIATIVE  CURRENT/MOST RECENT THERAPY- carbo-Taxol [C] .     Primary cancer of right upper lobe of lung ([place])  03/13/2018 - 05/15/2018 Chemotherapy   The patient had palonosetron (ALOXI) injection 0.25 mg, 0.25 mg, Intravenous,  Once, 9 of 9 cycles Administration: 0.25 mg (03/13/2018), 0.25 mg (04/10/2018), 0.25 mg (04/17/2018), 0.25 mg (03/21/2018), 0.25 mg (04/25/2018), 0.25 mg (03/27/2018), 0.25 mg (04/03/2018), 0.25 mg (05/01/2018), 0.25 mg (05/08/2018) CARBOplatin (PARAPLATIN) 130 mg in sodium chloride 0.9 % 250 mL chemo infusion, 130 mg (100 % of original dose 131.4 mg), Intravenous,  Once, 9 of 9 cycles Dose modification:   (original dose 131.4 mg, Cycle 1) Administration: 130 mg (03/13/2018), 130 mg (04/10/2018), 130 mg (04/17/2018), 130 mg (03/21/2018), 130 mg (04/25/2018), 130 mg (03/27/2018), 130 mg (04/03/2018), 130 mg (05/01/2018), 130 mg (05/08/2018) PACLitaxel (TAXOL) 78 mg in sodium chloride  0.9 % 250 mL chemo infusion (</= 35m/m2), 45 mg/m2 = 78 mg, Intravenous,  Once, 9 of 9 cycles Administration: 78 mg (03/13/2018), 78 mg (04/10/2018), 78 mg (04/17/2018), 78 mg (03/21/2018), 78 mg (04/25/2018), 78 mg (03/27/2018), 78 mg (04/03/2018), 78 mg (05/01/2018), 78 mg (05/08/2018)  for chemotherapy treatment.    09/12/2019 - 12/04/2019 Chemotherapy   The patient had pembrolizumab (KEYTRUDA) 200 mg in sodium chloride 0.9 % 50 mL chemo infusion, 200 mg, Intravenous, Once, 4 of 6 cycles Administration: 200 mg (09/12/2019), 200 mg (10/03/2019), 200 mg (10/24/2019), 200 mg (11/14/2019)  for chemotherapy treatment.    12/19/2019 -  Chemotherapy   The patient had palonosetron (ALOXI) injection 0.25 mg, 0.25 mg, Intravenous,  Once, 0 of 8 cycles CARBOplatin (PARAPLATIN) 120 mg in sodium chloride 0.9 % 100 mL chemo infusion, 120 mg (100 % of original dose 122 mg), Intravenous,  Once, 0 of 8 cycles Dose modification:   (original dose 122 mg, Cycle 1) PACLitaxel (TAXOL) 138 mg in sodium chloride 0.9 % 250 mL chemo infusion (</= 860mm2), 80 mg/m2 = 138 mg, Intravenous,  Once, 0 of 8 cycles  for chemotherapy treatment.        INTERVAL HISTORY:   Brenda SCHEPER21.o.  female patient with recurrent/metastatic squamous cell lung cancer-most recently on Keytruda noted to have progression of disease is here for follow-up.   Patient denies any worsening shortness of breath or cough.  Chronic mild fatigue.  No nausea no vomiting.  No tingling or numbness.  Review of Systems  Constitutional: Positive for malaise/fatigue. Negative for chills, diaphoresis and fever.  HENT: Negative for nosebleeds and sore throat.   Eyes: Negative for double vision.  Respiratory: Positive for  cough and shortness of breath. Negative for hemoptysis, sputum production and wheezing.   Cardiovascular: Negative for chest pain, palpitations and orthopnea.  Gastrointestinal: Negative for abdominal pain, blood in stool, heartburn, melena,  nausea and vomiting.  Genitourinary: Negative for dysuria, frequency and urgency.  Musculoskeletal: Positive for joint pain. Negative for back pain.  Skin: Negative.  Negative for itching and rash.  Neurological: Negative for tingling, focal weakness, weakness and headaches.  Endo/Heme/Allergies: Does not bruise/bleed easily.  Psychiatric/Behavioral: Negative for depression. The patient is not nervous/anxious and does not have insomnia.     PAST MEDICAL HISTORY :  Past Medical History:  Diagnosis Date  . Benign liver cyst   . Breast cancer ([place]) 2005   right breast  . COPD (chronic obstructive pulmonary disease) ([place])   . Dyspnea   . Glaucoma   . Hemoptysis 2019  . Hypertension    Not on medication for htn. patient denies this  . Lung mass 01/2018  . Personal history of radiation therapy     PAST SURGICAL HISTORY :   Past Surgical History:  Procedure Laterality Date  . BREAST EXCISIONAL BIOPSY Right 2005   positive, radiation  . BREAST LUMPECTOMY Right 03/2004  . CHOLECYSTECTOMY  1988  . COLONOSCOPY  2012  . ENDOBRONCHIAL ULTRASOUND N/A 02/21/2018   Procedure: ENDOBRONCHIAL ULTRASOUND;  Surgeon: Laverle Hobby, MD;  Location: [facility] ORS;  Service: Pulmonary;  Laterality: N/A;  . EYE SURGERY Bilateral 2009,2010   cataract extractions with lens implant  . GALLBLADDER SURGERY Bilateral   . IR FLUORO GUIDE PORT INSERTION RIGHT  03/10/2018  . JOINT REPLACEMENT Left 2008   left hip replacement  . PORTACATH PLACEMENT  03/10/2018  . TONSILLECTOMY  1960    FAMILY HISTORY :   Family History  Problem Relation Age of Onset  . Breast cancer Mother 27  . CAD Father     SOCIAL HISTORY:   Social History   Tobacco Use  . Smoking status: Former Smoker    Packs/day: 1.00    Types: Cigarettes    Quit date: 10/25/2005    Years since quitting: 14.1  . Smokeless tobacco: Never Used  Substance Use Topics  . Alcohol use: Not Currently    Comment: occasional glass of beer  .  Drug use: Never    ALLERGIES:  is allergic to penicillins.  MEDICATIONS:  Current Outpatient Medications  Medication Sig Dispense Refill  . albuterol (PROVENTIL HFA) 108 (90 Base) MCG/ACT inhaler Inhale 2 puffs into the lungs every 6 (six) hours as needed for wheezing or shortness of breath.     . Cholecalciferol (VITAMIN D3) 1000 units CAPS Take 3,000 Units by mouth daily.     Marland Kitchen latanoprost (XALATAN) 0.005 % ophthalmic solution Apply 1 drop to eye daily.    . ondansetron (ZOFRAN) 8 MG tablet One pill every 8 hours as needed for nausea/vomitting. 40 tablet 1  . oxyCODONE (OXY IR/ROXICODONE) 5 MG immediate release tablet Take 1 tablet (5 mg total) by mouth 2 (two) times daily as needed for moderate pain or severe pain. 30 tablet 0  . TRELEGY ELLIPTA 100-62.5-25 MCG/INH AEPB     . traMADol (ULTRAM) 50 MG tablet Take 50 mg by mouth 2 (two) times daily as needed.     No current facility-administered medications for this visit.    PHYSICAL EXAMINATION: ECOG PERFORMANCE STATUS: 1 - Symptomatic but completely ambulatory  BP (!) 147/80 (BP Location: Left Arm, Patient Position: Sitting, Cuff Size: Normal)   Pulse 89  Temp 97.6 F (36.4 C) (Tympanic)   Resp 20   Ht 5' 4"  (1.626 m)   Wt 147 lb (66.7 kg)   SpO2 100% Comment: room air  BMI 25.23 kg/m   Filed Weights   12/19/19 0950  Weight: 147 lb (66.7 kg)    Physical Exam  Constitutional: She is oriented to person, place, and time.  Frail-appearing Caucasian female patient..  She is walking herself.  Alone.  HENT:  Head: Normocephalic and atraumatic.  Mouth/Throat: Oropharynx is clear and moist. No oropharyngeal exudate.  Eyes: Pupils are equal, round, and reactive to light.  Cardiovascular: Normal rate and regular rhythm.  Pulmonary/Chest: No respiratory distress. She has no wheezes.  Decreased breath sounds in the right compared to the left.  Abdominal: Soft. Bowel sounds are normal. She exhibits no distension and no mass.  There is no abdominal tenderness. There is no rebound and no guarding.  Musculoskeletal:        General: No tenderness or edema. Normal range of motion.     Cervical back: Normal range of motion and neck supple.  Neurological: She is alert and oriented to person, place, and time.  Skin: Skin is warm.  Psychiatric: Affect normal.    LABORATORY DATA:  I have reviewed the data as listed    Component Value Date/Time   NA 137 12/19/2019 0935   K 3.9 12/19/2019 0935   CL 104 12/19/2019 0935   CO2 23 12/19/2019 0935   GLUCOSE 109 (H) 12/19/2019 0935   BUN 17 12/19/2019 0935   CREATININE 1.28 (H) 12/19/2019 0935   CALCIUM 9.2 12/19/2019 0935   PROT 8.1 12/19/2019 0935   ALBUMIN 3.2 (L) 12/19/2019 0935   AST 12 (L) 12/19/2019 0935   ALT 9 12/19/2019 0935   ALKPHOS 73 12/19/2019 0935   BILITOT 0.4 12/19/2019 0935   GFRNONAA 39 (L) 12/19/2019 0935   GFRAA 45 (L) 12/19/2019 0935    No results found for: SPEP, UPEP  Lab Results  Component Value Date   WBC 9.0 12/19/2019   NEUTROABS 7.2 12/19/2019   HGB 10.7 (L) 12/19/2019   HCT 35.8 (L) 12/19/2019   MCV 88.2 12/19/2019   PLT 347 12/19/2019      Chemistry      Component Value Date/Time   NA 137 12/19/2019 0935   K 3.9 12/19/2019 0935   CL 104 12/19/2019 0935   CO2 23 12/19/2019 0935   BUN 17 12/19/2019 0935   CREATININE 1.28 (H) 12/19/2019 0935      Component Value Date/Time   CALCIUM 9.2 12/19/2019 0935   ALKPHOS 73 12/19/2019 0935   AST 12 (L) 12/19/2019 0935   ALT 9 12/19/2019 0935   BILITOT 0.4 12/19/2019 0935       RADIOGRAPHIC STUDIES: I have personally reviewed the radiological images as listed and agreed with the findings in the report. No results found.   ASSESSMENT & PLAN:  Primary cancer of right upper lobe of lung ([place]) # Right upper lobe lung cancer-progressive/metastatic-stage IV; FEB 5th 2021-  CT scan chest-scarring right upper lobe stable; however progressive worsening lesions noted right lower  lobe ~centimeter in size; multiple.   # Will proceed with carbo-taxol weekly. Labs today reviewed;  acceptable for treatment today.   # Discussed treatments are palliative not curative discussed the again the potenial side effects.  #Right chest wall pain-likely secondary to pleurisy/scar tissue chronic-STABLE.   #COPD- STABLE; continue current medication/inhalers.   # Palliative care evaluation: declines evaluation today.  #  DISPOSITION: # 1 week- labs- cbc/bmp; Carbo-Taxol weekly. # in 2 weeks; MD- Labs- Cbc/cmp; Carbo-Taxol weekly- Dr.B        Orders Placed This Encounter  Procedures  . CBC with Differential    Standing Status:   Future    Standing Expiration Date:   12/18/2020  . Basic metabolic panel    Standing Status:   Future    Standing Expiration Date:   12/18/2020   All questions were answered. The patient knows to call the clinic with any problems, questions or concerns.      [name], MD 12/19/2019 10:22 AM

## 2019-12-19 NOTE — Progress Notes (Signed)
Pt tolerated first time Taxol and Carboplatin infusion well with no signs of reaction. VSS throughout infusion and prior to discharge. *See flowsheets for vitals*   MD made aware that pt was experiencing an unsteady gait and having trouble articulating her words after the administration of Benadryl was given per order prior to chemotherapy infusion. Symptoms subsided prior to discharge, MD made aware. RN educated pt and her son on the importance of calling the clinic if any signs of complications or reaction occur at home and if it is an emergency to call 911. Pt verbalized understanding and all questions answered at this time. VSS and pt stable for discharge.   Fredi Hurtado [employer]

## 2019-12-19 NOTE — Assessment & Plan Note (Signed)
#   Right upper lobe lung cancer-progressive/metastatic-stage IV; FEB 5th 2021-  CT scan chest-scarring right upper lobe stable; however progressive worsening lesions noted right lower lobe ~centimeter in size; multiple.   # Will proceed with carbo-taxol weekly. Labs today reviewed;  acceptable for treatment today.   # Discussed treatments are palliative not curative discussed the again the potenial side effects.  #Right chest wall pain-likely secondary to pleurisy/scar tissue chronic-STABLE.   #COPD- STABLE; continue current medication/inhalers.   # Palliative care evaluation: declines evaluation today.  # DISPOSITION: # 1 week- labs- cbc/bmp; Carbo-Taxol weekly. # in 2 weeks; MD- Labs- Cbc/cmp; Carbo-Taxol weekly- Dr.B

## 2019-12-28 ENCOUNTER — Inpatient Hospital Stay: Payer: Medicare Other

## 2019-12-28 ENCOUNTER — Other Ambulatory Visit: Payer: Self-pay | Admitting: Internal Medicine

## 2019-12-28 ENCOUNTER — Other Ambulatory Visit: Payer: Self-pay

## 2019-12-28 ENCOUNTER — Inpatient Hospital Stay: Payer: Medicare Other | Attending: Internal Medicine

## 2019-12-28 VITALS — BP 134/69 | HR 81 | Temp 98.6°F | Resp 18 | Wt 149.6 lb

## 2019-12-28 DIAGNOSIS — C3411 Malignant neoplasm of upper lobe, right bronchus or lung: Secondary | ICD-10-CM | POA: Insufficient documentation

## 2019-12-28 DIAGNOSIS — J449 Chronic obstructive pulmonary disease, unspecified: Secondary | ICD-10-CM | POA: Diagnosis not present

## 2019-12-28 DIAGNOSIS — Z87891 Personal history of nicotine dependence: Secondary | ICD-10-CM | POA: Diagnosis not present

## 2019-12-28 DIAGNOSIS — I10 Essential (primary) hypertension: Secondary | ICD-10-CM | POA: Diagnosis not present

## 2019-12-28 DIAGNOSIS — Z5111 Encounter for antineoplastic chemotherapy: Secondary | ICD-10-CM | POA: Diagnosis present

## 2019-12-28 DIAGNOSIS — R0789 Other chest pain: Secondary | ICD-10-CM | POA: Insufficient documentation

## 2019-12-28 DIAGNOSIS — Z79899 Other long term (current) drug therapy: Secondary | ICD-10-CM | POA: Diagnosis not present

## 2019-12-28 DIAGNOSIS — Z7189 Other specified counseling: Secondary | ICD-10-CM

## 2019-12-28 DIAGNOSIS — H409 Unspecified glaucoma: Secondary | ICD-10-CM | POA: Diagnosis not present

## 2019-12-28 LAB — CBC WITH DIFFERENTIAL/PLATELET
Abs Immature Granulocytes: 0.03 10*3/uL (ref 0.00–0.07)
Basophils Absolute: 0 10*3/uL (ref 0.0–0.1)
Basophils Relative: 1 %
Eosinophils Absolute: 0.6 10*3/uL — ABNORMAL HIGH (ref 0.0–0.5)
Eosinophils Relative: 10 %
HCT: 33.7 % — ABNORMAL LOW (ref 36.0–46.0)
Hemoglobin: 10.2 g/dL — ABNORMAL LOW (ref 12.0–15.0)
Immature Granulocytes: 1 %
Lymphocytes Relative: 8 %
Lymphs Abs: 0.5 10*3/uL — ABNORMAL LOW (ref 0.7–4.0)
MCH: 26.6 pg (ref 26.0–34.0)
MCHC: 30.3 g/dL (ref 30.0–36.0)
MCV: 88 fL (ref 80.0–100.0)
Monocytes Absolute: 0.5 10*3/uL (ref 0.1–1.0)
Monocytes Relative: 7 %
Neutro Abs: 4.5 10*3/uL (ref 1.7–7.7)
Neutrophils Relative %: 73 %
Platelets: 340 10*3/uL (ref 150–400)
RBC: 3.83 MIL/uL — ABNORMAL LOW (ref 3.87–5.11)
RDW: 15.9 % — ABNORMAL HIGH (ref 11.5–15.5)
WBC: 6.1 10*3/uL (ref 4.0–10.5)
nRBC: 0 % (ref 0.0–0.2)

## 2019-12-28 LAB — BASIC METABOLIC PANEL
Anion gap: 8 (ref 5–15)
BUN: 24 mg/dL — ABNORMAL HIGH (ref 8–23)
CO2: 22 mmol/L (ref 22–32)
Calcium: 9.3 mg/dL (ref 8.9–10.3)
Chloride: 107 mmol/L (ref 98–111)
Creatinine, Ser: 1.21 mg/dL — ABNORMAL HIGH (ref 0.44–1.00)
GFR calc Af Amer: 49 mL/min — ABNORMAL LOW (ref >60)
GFR calc non Af Amer: 42 mL/min — ABNORMAL LOW (ref >60)
Glucose, Bld: 101 mg/dL — ABNORMAL HIGH (ref 70–99)
Potassium: 3.7 mmol/L (ref 3.5–5.1)
Sodium: 137 mmol/L (ref 135–145)

## 2019-12-28 MED ORDER — SODIUM CHLORIDE 0.9 % IV SOLN
80.0000 mg/m2 | Freq: Once | INTRAVENOUS | Status: AC
  Administered 2019-12-28: 138 mg via INTRAVENOUS
  Filled 2019-12-28: qty 23

## 2019-12-28 MED ORDER — HEPARIN SOD (PORK) LOCK FLUSH 100 UNIT/ML IV SOLN
INTRAVENOUS | Status: AC
  Filled 2019-12-28: qty 5

## 2019-12-28 MED ORDER — SODIUM CHLORIDE 0.9 % IV SOLN
Freq: Once | INTRAVENOUS | Status: AC
  Filled 2019-12-28: qty 250

## 2019-12-28 MED ORDER — DIPHENHYDRAMINE HCL 50 MG/ML IJ SOLN
25.0000 mg | Freq: Once | INTRAMUSCULAR | Status: AC
  Administered 2019-12-28: 25 mg via INTRAVENOUS
  Filled 2019-12-28: qty 1

## 2019-12-28 MED ORDER — FAMOTIDINE IN NACL 20-0.9 MG/50ML-% IV SOLN
20.0000 mg | Freq: Once | INTRAVENOUS | Status: AC
  Administered 2019-12-28: 20 mg via INTRAVENOUS
  Filled 2019-12-28: qty 50

## 2019-12-28 MED ORDER — SODIUM CHLORIDE 0.9 % IV SOLN
20.0000 mg | Freq: Once | INTRAVENOUS | Status: AC
  Administered 2019-12-28: 20 mg via INTRAVENOUS
  Filled 2019-12-28: qty 20

## 2019-12-28 MED ORDER — PALONOSETRON HCL INJECTION 0.25 MG/5ML
0.2500 mg | Freq: Once | INTRAVENOUS | Status: AC
  Administered 2019-12-28: 0.25 mg via INTRAVENOUS
  Filled 2019-12-28: qty 5

## 2019-12-28 MED ORDER — HEPARIN SOD (PORK) LOCK FLUSH 100 UNIT/ML IV SOLN
500.0000 [IU] | Freq: Once | INTRAVENOUS | Status: AC | PRN
  Administered 2019-12-28: 500 [IU]
  Filled 2019-12-28: qty 5

## 2019-12-28 MED ORDER — SODIUM CHLORIDE 0.9 % IV SOLN
120.0000 mg | Freq: Once | INTRAVENOUS | Status: AC
  Administered 2019-12-28: 120 mg via INTRAVENOUS
  Filled 2019-12-28: qty 12

## 2019-12-28 NOTE — Progress Notes (Signed)
Ok to proceed with tx with LFT results not back per MD.  Decreasing benadryl to 25mg  b/c patient did not do well with 50mg  last time.

## 2019-12-28 NOTE — Progress Notes (Signed)
Port accessed, flushes well, no blood return noted. MD notified. Ok to proceed with chemo via port. Labs drawn peripherally. MD ok with lab results and no LFT results today. 25 mg benadryl dose requested from MD. Pt very drowsy and could not walk well with 50 mg dose last treatment per previous RN.   Pt tolerated taxol/carboplatin treatment well. Port heparin locked and deaccessed. Pt discharged in NAD.

## 2020-01-04 ENCOUNTER — Inpatient Hospital Stay: Payer: Medicare Other

## 2020-01-04 ENCOUNTER — Inpatient Hospital Stay (HOSPITAL_BASED_OUTPATIENT_CLINIC_OR_DEPARTMENT_OTHER): Payer: Medicare Other | Admitting: Hospice and Palliative Medicine

## 2020-01-04 ENCOUNTER — Inpatient Hospital Stay (HOSPITAL_BASED_OUTPATIENT_CLINIC_OR_DEPARTMENT_OTHER): Payer: Medicare Other | Admitting: Internal Medicine

## 2020-01-04 DIAGNOSIS — C3411 Malignant neoplasm of upper lobe, right bronchus or lung: Secondary | ICD-10-CM

## 2020-01-04 DIAGNOSIS — Z7189 Other specified counseling: Secondary | ICD-10-CM

## 2020-01-04 DIAGNOSIS — Z5111 Encounter for antineoplastic chemotherapy: Secondary | ICD-10-CM | POA: Diagnosis not present

## 2020-01-04 DIAGNOSIS — Z515 Encounter for palliative care: Secondary | ICD-10-CM

## 2020-01-04 LAB — CBC WITH DIFFERENTIAL/PLATELET
Abs Immature Granulocytes: 0.05 10*3/uL (ref 0.00–0.07)
Basophils Absolute: 0 10*3/uL (ref 0.0–0.1)
Basophils Relative: 1 %
Eosinophils Absolute: 0.3 10*3/uL (ref 0.0–0.5)
Eosinophils Relative: 5 %
HCT: 34.3 % — ABNORMAL LOW (ref 36.0–46.0)
Hemoglobin: 10.6 g/dL — ABNORMAL LOW (ref 12.0–15.0)
Immature Granulocytes: 1 %
Lymphocytes Relative: 8 %
Lymphs Abs: 0.5 10*3/uL — ABNORMAL LOW (ref 0.7–4.0)
MCH: 27 pg (ref 26.0–34.0)
MCHC: 30.9 g/dL (ref 30.0–36.0)
MCV: 87.5 fL (ref 80.0–100.0)
Monocytes Absolute: 0.4 10*3/uL (ref 0.1–1.0)
Monocytes Relative: 6 %
Neutro Abs: 4.6 10*3/uL (ref 1.7–7.7)
Neutrophils Relative %: 79 %
Platelets: 317 10*3/uL (ref 150–400)
RBC: 3.92 MIL/uL (ref 3.87–5.11)
RDW: 16.4 % — ABNORMAL HIGH (ref 11.5–15.5)
WBC: 5.9 10*3/uL (ref 4.0–10.5)
nRBC: 0 % (ref 0.0–0.2)

## 2020-01-04 LAB — COMPREHENSIVE METABOLIC PANEL
ALT: 16 U/L (ref 0–44)
AST: 16 U/L (ref 15–41)
Albumin: 3.3 g/dL — ABNORMAL LOW (ref 3.5–5.0)
Alkaline Phosphatase: 82 U/L (ref 38–126)
Anion gap: 9 (ref 5–15)
BUN: 22 mg/dL (ref 8–23)
CO2: 23 mmol/L (ref 22–32)
Calcium: 9.3 mg/dL (ref 8.9–10.3)
Chloride: 104 mmol/L (ref 98–111)
Creatinine, Ser: 1.16 mg/dL — ABNORMAL HIGH (ref 0.44–1.00)
GFR calc Af Amer: 51 mL/min — ABNORMAL LOW (ref >60)
GFR calc non Af Amer: 44 mL/min — ABNORMAL LOW (ref >60)
Glucose, Bld: 102 mg/dL — ABNORMAL HIGH (ref 70–99)
Potassium: 4.1 mmol/L (ref 3.5–5.1)
Sodium: 136 mmol/L (ref 135–145)
Total Bilirubin: 0.5 mg/dL (ref 0.3–1.2)
Total Protein: 7.5 g/dL (ref 6.5–8.1)

## 2020-01-04 MED ORDER — DIPHENHYDRAMINE HCL 50 MG/ML IJ SOLN
25.0000 mg | Freq: Once | INTRAMUSCULAR | Status: AC
  Administered 2020-01-04: 25 mg via INTRAVENOUS
  Filled 2020-01-04: qty 1

## 2020-01-04 MED ORDER — FAMOTIDINE IN NACL 20-0.9 MG/50ML-% IV SOLN
20.0000 mg | Freq: Once | INTRAVENOUS | Status: AC
  Administered 2020-01-04: 20 mg via INTRAVENOUS
  Filled 2020-01-04: qty 50

## 2020-01-04 MED ORDER — SODIUM CHLORIDE 0.9 % IV SOLN
Freq: Once | INTRAVENOUS | Status: AC
  Filled 2020-01-04: qty 250

## 2020-01-04 MED ORDER — HEPARIN SOD (PORK) LOCK FLUSH 100 UNIT/ML IV SOLN
500.0000 [IU] | Freq: Once | INTRAVENOUS | Status: AC
  Administered 2020-01-04: 500 [IU] via INTRAVENOUS
  Filled 2020-01-04: qty 5

## 2020-01-04 MED ORDER — HEPARIN SOD (PORK) LOCK FLUSH 100 UNIT/ML IV SOLN
INTRAVENOUS | Status: AC
  Filled 2020-01-04: qty 5

## 2020-01-04 MED ORDER — SODIUM CHLORIDE 0.9 % IV SOLN
80.0000 mg/m2 | Freq: Once | INTRAVENOUS | Status: AC
  Administered 2020-01-04: 138 mg via INTRAVENOUS
  Filled 2020-01-04: qty 23

## 2020-01-04 MED ORDER — SODIUM CHLORIDE 0.9 % IV SOLN
20.0000 mg | Freq: Once | INTRAVENOUS | Status: AC
  Administered 2020-01-04: 20 mg via INTRAVENOUS
  Filled 2020-01-04: qty 20

## 2020-01-04 MED ORDER — PALONOSETRON HCL INJECTION 0.25 MG/5ML
0.2500 mg | Freq: Once | INTRAVENOUS | Status: AC
  Administered 2020-01-04: 0.25 mg via INTRAVENOUS
  Filled 2020-01-04: qty 5

## 2020-01-04 MED ORDER — SODIUM CHLORIDE 0.9% FLUSH
10.0000 mL | Freq: Once | INTRAVENOUS | Status: AC
  Administered 2020-01-04: 10 mL via INTRAVENOUS
  Filled 2020-01-04: qty 10

## 2020-01-04 MED ORDER — SODIUM CHLORIDE 0.9 % IV SOLN
120.0000 mg | Freq: Once | INTRAVENOUS | Status: AC
  Administered 2020-01-04: 120 mg via INTRAVENOUS
  Filled 2020-01-04: qty 12

## 2020-01-04 NOTE — Assessment & Plan Note (Addendum)
#   Right upper lobe lung cancer-progressive/metastatic-stage IV; FEB 5th 2021-  CT scan chest-scarring right upper lobe stable; however progressive worsening lesions noted right lower lobe ~centimeter in size; multiple. On pallaitive carbo-taxol weekly.  # Proceed with carbo-taxol weekly. Labs today reviewed;  acceptable for treatment today.   #Right chest wall pain-likely secondary to pleurisy/scar tissue chronic-STABLE.   #COPD- STABLE; continue current medication/inhalers.   # Palliative care evaluation: declines evaluation today.  # DISPOSITION: # 1 week- labs- cbc/bmp; Carbo-Taxol weekly. # in 2 weeks; MD- Labs- Cbc/cmp; Carbo-Taxol weekly- Dr.B

## 2020-01-04 NOTE — Progress Notes (Signed)
[place] OFFICE PROGRESS NOTE  Patient Care Team: Leonel Ramsay, MD as PCP - General (Infectious Diseases) Telford Nab, RN as Registered Nurse [name], MD as Medical Oncologist (Medical Oncology)  Cancer Staging No matching staging information was found for the patient.   Oncology History Overview Note  # April 2019- RUL LUNG CANCER-non-small cell; favor squamous cell; PET scan T4N0 [right upper lobe mass involving[mediastinal/blood results] vs small pleural effusion [?M1]- CARBO-TAXOL- RT;   #October 2020-CT progressive subcentimeter right-sided lung lesions;  # NOV 17th 2020- KEYTRUDA q3 W  # 24th FEB 2021- Carbo-Taxol weekly-   # Hx of Right breast ca [s/p lumpec; RT; No chemo; "pill" x5 years]  # COPD/Hx of smoking  # PALLIATIVE CARE: 12/19/2019- DELCINED  MOLECULAR TESTING/OMNISEQ: PDL-1 25% [TPS; TMB-H; No targets**]  --------------------------------------------------    DIAGNOSIS: [ April 2019]SQUAMOUS CELL LUNG CA  STAGE: IV/Recurrent ;GOALS: PALLIATIVE  CURRENT/MOST RECENT THERAPY- carbo-Taxol [C] .     Primary cancer of right upper lobe of lung ([place])  03/13/2018 - 05/15/2018 Chemotherapy   The patient had palonosetron (ALOXI) injection 0.25 mg, 0.25 mg, Intravenous,  Once, 9 of 9 cycles Administration: 0.25 mg (03/13/2018), 0.25 mg (04/10/2018), 0.25 mg (04/17/2018), 0.25 mg (03/21/2018), 0.25 mg (04/25/2018), 0.25 mg (03/27/2018), 0.25 mg (04/03/2018), 0.25 mg (05/01/2018), 0.25 mg (05/08/2018) CARBOplatin (PARAPLATIN) 130 mg in sodium chloride 0.9 % 250 mL chemo infusion, 130 mg (100 % of original dose 131.4 mg), Intravenous,  Once, 9 of 9 cycles Dose modification:   (original dose 131.4 mg, Cycle 1) Administration: 130 mg (03/13/2018), 130 mg (04/10/2018), 130 mg (04/17/2018), 130 mg (03/21/2018), 130 mg (04/25/2018), 130 mg (03/27/2018), 130 mg (04/03/2018), 130 mg (05/01/2018), 130 mg (05/08/2018) PACLitaxel (TAXOL) 78 mg in sodium chloride  0.9 % 250 mL chemo infusion (</= 68m/m2), 45 mg/m2 = 78 mg, Intravenous,  Once, 9 of 9 cycles Administration: 78 mg (03/13/2018), 78 mg (04/10/2018), 78 mg (04/17/2018), 78 mg (03/21/2018), 78 mg (04/25/2018), 78 mg (03/27/2018), 78 mg (04/03/2018), 78 mg (05/01/2018), 78 mg (05/08/2018)  for chemotherapy treatment.    09/12/2019 - 12/04/2019 Chemotherapy   The patient had pembrolizumab (KEYTRUDA) 200 mg in sodium chloride 0.9 % 50 mL chemo infusion, 200 mg, Intravenous, Once, 4 of 6 cycles Administration: 200 mg (09/12/2019), 200 mg (10/03/2019), 200 mg (10/24/2019), 200 mg (11/14/2019)  for chemotherapy treatment.    12/19/2019 -  Chemotherapy   The patient had palonosetron (ALOXI) injection 0.25 mg, 0.25 mg, Intravenous,  Once, 3 of 8 cycles Administration: 0.25 mg (12/19/2019), 0.25 mg (12/28/2019), 0.25 mg (01/04/2020) CARBOplatin (PARAPLATIN) 120 mg in sodium chloride 0.9 % 100 mL chemo infusion, 120 mg (100 % of original dose 122 mg), Intravenous,  Once, 3 of 8 cycles Dose modification:   (original dose 122 mg, Cycle 1) Administration: 120 mg (12/19/2019), 120 mg (12/28/2019), 120 mg (01/04/2020) PACLitaxel (TAXOL) 138 mg in sodium chloride 0.9 % 250 mL chemo infusion (</= 84mm2), 80 mg/m2 = 138 mg, Intravenous,  Once, 3 of 8 cycles Administration: 138 mg (12/19/2019), 138 mg (12/28/2019), 138 mg (01/04/2020)  for chemotherapy treatment.        INTERVAL HISTORY:   Brenda Parker.o.  female patient with recurrent/metastatic squamous cell lung cancer-currently on weekly carbotaxol is here for follow-up.  Patient denies any nausea vomiting.  Appetite is fair.  No hair loss.  Chronic mild shortness of breath chronic cough.  Not any worse.  Mild to moderate fatigue.   Review of  Systems  Constitutional: Positive for malaise/fatigue. Negative for chills, diaphoresis and fever.  HENT: Negative for nosebleeds and sore throat.   Eyes: Negative for double vision.  Respiratory: Positive for cough and shortness  of breath. Negative for hemoptysis, sputum production and wheezing.   Cardiovascular: Negative for chest pain, palpitations and orthopnea.  Gastrointestinal: Negative for abdominal pain, blood in stool, heartburn, melena, nausea and vomiting.  Genitourinary: Negative for dysuria, frequency and urgency.  Musculoskeletal: Positive for joint pain. Negative for back pain.  Skin: Negative.  Negative for itching and rash.  Neurological: Negative for tingling, focal weakness, weakness and headaches.  Endo/Heme/Allergies: Does not bruise/bleed easily.  Psychiatric/Behavioral: Negative for depression. The patient is not nervous/anxious and does not have insomnia.     PAST MEDICAL HISTORY :  Past Medical History:  Diagnosis Date  . Benign liver cyst   . Breast cancer ([place]) 2005   right breast  . COPD (chronic obstructive pulmonary disease) ([place])   . Dyspnea   . Glaucoma   . Hemoptysis 2019  . Hypertension    Not on medication for htn. patient denies this  . Lung mass 01/2018  . Personal history of radiation therapy     PAST SURGICAL HISTORY :   Past Surgical History:  Procedure Laterality Date  . BREAST EXCISIONAL BIOPSY Right 2005   positive, radiation  . BREAST LUMPECTOMY Right 03/2004  . CHOLECYSTECTOMY  1988  . COLONOSCOPY  2012  . ENDOBRONCHIAL ULTRASOUND N/A 02/21/2018   Procedure: ENDOBRONCHIAL ULTRASOUND;  Surgeon: Laverle Hobby, MD;  Location: [facility] ORS;  Service: Pulmonary;  Laterality: N/A;  . EYE SURGERY Bilateral 2009,2010   cataract extractions with lens implant  . GALLBLADDER SURGERY Bilateral   . IR FLUORO GUIDE PORT INSERTION RIGHT  03/10/2018  . JOINT REPLACEMENT Left 2008   left hip replacement  . PORTACATH PLACEMENT  03/10/2018  . TONSILLECTOMY  1960    FAMILY HISTORY :   Family History  Problem Relation Age of Onset  . Breast cancer Mother 83  . CAD Father     SOCIAL HISTORY:   Social History   Tobacco Use  . Smoking status: Former Smoker     Packs/day: 1.00    Types: Cigarettes    Quit date: 10/25/2005    Years since quitting: 14.2  . Smokeless tobacco: Never Used  Substance Use Topics  . Alcohol use: Not Currently    Comment: occasional glass of beer  . Drug use: Never    ALLERGIES:  is allergic to penicillins.  MEDICATIONS:  Current Outpatient Medications  Medication Sig Dispense Refill  . albuterol (PROVENTIL HFA) 108 (90 Base) MCG/ACT inhaler Inhale 2 puffs into the lungs every 6 (six) hours as needed for wheezing or shortness of breath.     . Cholecalciferol (VITAMIN D3) 1000 units CAPS Take 3,000 Units by mouth daily.     Marland Kitchen latanoprost (XALATAN) 0.005 % ophthalmic solution Apply 1 drop to eye daily.    . ondansetron (ZOFRAN) 8 MG tablet One pill every 8 hours as needed for nausea/vomitting. 40 tablet 1  . oxyCODONE (OXY IR/ROXICODONE) 5 MG immediate release tablet Take 1 tablet (5 mg total) by mouth 2 (two) times daily as needed for moderate pain or severe pain. 30 tablet 0  . traMADol (ULTRAM) 50 MG tablet Take 50 mg by mouth 2 (two) times daily as needed.    . TRELEGY ELLIPTA 100-62.5-25 MCG/INH AEPB      No current facility-administered medications for this visit.  PHYSICAL EXAMINATION: ECOG PERFORMANCE STATUS: 1 - Symptomatic but completely ambulatory  BP 126/70 (BP Location: Left Arm, Patient Position: Sitting, Cuff Size: Normal)   Pulse 99   Temp (!) 97.3 F (36.3 C) (Tympanic)   Resp 20   Wt 146 lb (66.2 kg)   BMI 25.06 kg/m   Filed Weights   01/04/20 0924  Weight: 146 lb (66.2 kg)    Physical Exam  Constitutional: She is oriented to person, place, and time.  Frail-appearing Caucasian female patient..  She is walking herself.  Alone.  HENT:  Head: Normocephalic and atraumatic.  Mouth/Throat: Oropharynx is clear and moist. No oropharyngeal exudate.  Eyes: Pupils are equal, round, and reactive to light.  Cardiovascular: Normal rate and regular rhythm.  Pulmonary/Chest: No respiratory  distress. She has no wheezes.  Decreased breath sounds in the right compared to the left.  Abdominal: Soft. Bowel sounds are normal. She exhibits no distension and no mass. There is no abdominal tenderness. There is no rebound and no guarding.  Musculoskeletal:        General: No tenderness or edema. Normal range of motion.     Cervical back: Normal range of motion and neck supple.  Neurological: She is alert and oriented to person, place, and time.  Skin: Skin is warm.  Psychiatric: Affect normal.    LABORATORY DATA:  I have reviewed the data as listed    Component Value Date/Time   NA 136 01/04/2020 0832   K 4.1 01/04/2020 0832   CL 104 01/04/2020 0832   CO2 23 01/04/2020 0832   GLUCOSE 102 (H) 01/04/2020 0832   BUN 22 01/04/2020 0832   CREATININE 1.16 (H) 01/04/2020 0832   CALCIUM 9.3 01/04/2020 0832   PROT 7.5 01/04/2020 0832   ALBUMIN 3.3 (L) 01/04/2020 0832   AST 16 01/04/2020 0832   ALT 16 01/04/2020 0832   ALKPHOS 82 01/04/2020 0832   BILITOT 0.5 01/04/2020 0832   GFRNONAA 44 (L) 01/04/2020 0832   GFRAA 51 (L) 01/04/2020 0832    No results found for: SPEP, UPEP  Lab Results  Component Value Date   WBC 5.9 01/04/2020   NEUTROABS 4.6 01/04/2020   HGB 10.6 (L) 01/04/2020   HCT 34.3 (L) 01/04/2020   MCV 87.5 01/04/2020   PLT 317 01/04/2020      Chemistry      Component Value Date/Time   NA 136 01/04/2020 0832   K 4.1 01/04/2020 0832   CL 104 01/04/2020 0832   CO2 23 01/04/2020 0832   BUN 22 01/04/2020 0832   CREATININE 1.16 (H) 01/04/2020 0832      Component Value Date/Time   CALCIUM 9.3 01/04/2020 0832   ALKPHOS 82 01/04/2020 0832   AST 16 01/04/2020 0832   ALT 16 01/04/2020 0832   BILITOT 0.5 01/04/2020 0832       RADIOGRAPHIC STUDIES: I have personally reviewed the radiological images as listed and agreed with the findings in the report. No results found.   ASSESSMENT & PLAN:  Primary cancer of right upper lobe of lung ([place]) # Right upper  lobe lung cancer-progressive/metastatic-stage IV; FEB 5th 2021-  CT scan chest-scarring right upper lobe stable; however progressive worsening lesions noted right lower lobe ~centimeter in size; multiple. On pallaitive carbo-taxol weekly.  # Proceed with carbo-taxol weekly. Labs today reviewed;  acceptable for treatment today.   #Right chest wall pain-likely secondary to pleurisy/scar tissue chronic-STABLE.   #COPD- STABLE; continue current medication/inhalers.   # Palliative care evaluation:  declines evaluation today.  # DISPOSITION: # 1 week- labs- cbc/bmp; Carbo-Taxol weekly. # in 2 weeks; MD- Labs- Cbc/cmp; Carbo-Taxol weekly- Dr.B  Orders Placed This Encounter  Procedures  . CBC with Differential    Standing Status:   Future    Standing Expiration Date:   01/03/2021  . Basic metabolic panel    Standing Status:   Future    Standing Expiration Date:   01/03/2021  . CBC with Differential    Standing Status:   Future    Standing Expiration Date:   01/03/2021  . Comprehensive metabolic panel    Standing Status:   Future    Standing Expiration Date:   01/03/2021   All questions were answered. The patient knows to call the clinic with any problems, questions or concerns.      [name], MD 01/07/2020 1:05 PM

## 2020-01-04 NOTE — Progress Notes (Signed)
I tried to see patient in the infusion area but she declined the visit.  Also note that she declined the visit with me on 2/24.  Per Dr. Aletha Halim request, I called and spoke with her son, Viney Acocella, to address advance care planning.  [name] says that patient has a living will and healthcare power of attorney and he will bring [place] a copy at the time of the next clinic visit.  He also says that he has discussed medical decision-making and the patient would want to be a DO NOT RESUSCITATE.  In fact, he says she has a signed DNR order at home on her refrigerator.  He will also bring [place] a copy of that so that we may scan it into the chart.

## 2020-01-04 NOTE — Progress Notes (Signed)
Port does not give blood return. MD aware of chronic issue, ok to use for treatments.

## 2020-01-11 ENCOUNTER — Other Ambulatory Visit: Payer: Self-pay

## 2020-01-11 ENCOUNTER — Inpatient Hospital Stay: Payer: Medicare Other

## 2020-01-11 VITALS — BP 135/86 | HR 91 | Temp 97.4°F | Resp 20 | Wt 146.2 lb

## 2020-01-11 DIAGNOSIS — Z5111 Encounter for antineoplastic chemotherapy: Secondary | ICD-10-CM | POA: Diagnosis not present

## 2020-01-11 DIAGNOSIS — C3411 Malignant neoplasm of upper lobe, right bronchus or lung: Secondary | ICD-10-CM

## 2020-01-11 DIAGNOSIS — Z7189 Other specified counseling: Secondary | ICD-10-CM

## 2020-01-11 LAB — COMPREHENSIVE METABOLIC PANEL
ALT: 16 U/L (ref 0–44)
AST: 14 U/L — ABNORMAL LOW (ref 15–41)
Albumin: 3.5 g/dL (ref 3.5–5.0)
Alkaline Phosphatase: 76 U/L (ref 38–126)
Anion gap: 9 (ref 5–15)
BUN: 24 mg/dL — ABNORMAL HIGH (ref 8–23)
CO2: 23 mmol/L (ref 22–32)
Calcium: 9.3 mg/dL (ref 8.9–10.3)
Chloride: 105 mmol/L (ref 98–111)
Creatinine, Ser: 1.34 mg/dL — ABNORMAL HIGH (ref 0.44–1.00)
GFR calc Af Amer: 43 mL/min — ABNORMAL LOW (ref >60)
GFR calc non Af Amer: 37 mL/min — ABNORMAL LOW (ref >60)
Glucose, Bld: 97 mg/dL (ref 70–99)
Potassium: 4 mmol/L (ref 3.5–5.1)
Sodium: 137 mmol/L (ref 135–145)
Total Bilirubin: 0.5 mg/dL (ref 0.3–1.2)
Total Protein: 7.5 g/dL (ref 6.5–8.1)

## 2020-01-11 LAB — CBC WITH DIFFERENTIAL/PLATELET
Abs Immature Granulocytes: 0.05 10*3/uL (ref 0.00–0.07)
Basophils Absolute: 0 10*3/uL (ref 0.0–0.1)
Basophils Relative: 1 %
Eosinophils Absolute: 0.2 10*3/uL (ref 0.0–0.5)
Eosinophils Relative: 6 %
HCT: 33.7 % — ABNORMAL LOW (ref 36.0–46.0)
Hemoglobin: 10.7 g/dL — ABNORMAL LOW (ref 12.0–15.0)
Immature Granulocytes: 1 %
Lymphocytes Relative: 13 %
Lymphs Abs: 0.5 10*3/uL — ABNORMAL LOW (ref 0.7–4.0)
MCH: 27.6 pg (ref 26.0–34.0)
MCHC: 31.8 g/dL (ref 30.0–36.0)
MCV: 86.9 fL (ref 80.0–100.0)
Monocytes Absolute: 0.3 10*3/uL (ref 0.1–1.0)
Monocytes Relative: 8 %
Neutro Abs: 2.8 10*3/uL (ref 1.7–7.7)
Neutrophils Relative %: 71 %
Platelets: 287 10*3/uL (ref 150–400)
RBC: 3.88 MIL/uL (ref 3.87–5.11)
RDW: 16.8 % — ABNORMAL HIGH (ref 11.5–15.5)
WBC: 3.9 10*3/uL — ABNORMAL LOW (ref 4.0–10.5)
nRBC: 0 % (ref 0.0–0.2)

## 2020-01-11 MED ORDER — PALONOSETRON HCL INJECTION 0.25 MG/5ML
0.2500 mg | Freq: Once | INTRAVENOUS | Status: AC
  Administered 2020-01-11: 0.25 mg via INTRAVENOUS
  Filled 2020-01-11: qty 5

## 2020-01-11 MED ORDER — HEPARIN SOD (PORK) LOCK FLUSH 100 UNIT/ML IV SOLN
500.0000 [IU] | Freq: Once | INTRAVENOUS | Status: DC | PRN
  Filled 2020-01-11: qty 5

## 2020-01-11 MED ORDER — SODIUM CHLORIDE 0.9 % IV SOLN
Freq: Once | INTRAVENOUS | Status: AC
  Filled 2020-01-11: qty 250

## 2020-01-11 MED ORDER — SODIUM CHLORIDE 0.9 % IV SOLN
20.0000 mg | Freq: Once | INTRAVENOUS | Status: AC
  Administered 2020-01-11: 20 mg via INTRAVENOUS
  Filled 2020-01-11: qty 20

## 2020-01-11 MED ORDER — FAMOTIDINE IN NACL 20-0.9 MG/50ML-% IV SOLN
20.0000 mg | Freq: Once | INTRAVENOUS | Status: AC
  Administered 2020-01-11: 20 mg via INTRAVENOUS
  Filled 2020-01-11: qty 50

## 2020-01-11 MED ORDER — SODIUM CHLORIDE 0.9 % IV SOLN
80.0000 mg/m2 | Freq: Once | INTRAVENOUS | Status: AC
  Administered 2020-01-11: 138 mg via INTRAVENOUS
  Filled 2020-01-11: qty 23

## 2020-01-11 MED ORDER — SODIUM CHLORIDE 0.9% FLUSH
10.0000 mL | Freq: Once | INTRAVENOUS | Status: AC
  Administered 2020-01-11: 10 mL via INTRAVENOUS
  Filled 2020-01-11: qty 10

## 2020-01-11 MED ORDER — SODIUM CHLORIDE 0.9 % IV SOLN
118.8000 mg | Freq: Once | INTRAVENOUS | Status: AC
  Administered 2020-01-11: 120 mg via INTRAVENOUS
  Filled 2020-01-11: qty 12

## 2020-01-11 MED ORDER — DIPHENHYDRAMINE HCL 50 MG/ML IJ SOLN
25.0000 mg | Freq: Once | INTRAMUSCULAR | Status: AC
  Administered 2020-01-11: 25 mg via INTRAVENOUS
  Filled 2020-01-11: qty 1

## 2020-01-11 MED ORDER — HEPARIN SOD (PORK) LOCK FLUSH 100 UNIT/ML IV SOLN
INTRAVENOUS | Status: AC
  Filled 2020-01-11: qty 5

## 2020-01-11 MED ORDER — HEPARIN SOD (PORK) LOCK FLUSH 100 UNIT/ML IV SOLN
500.0000 [IU] | Freq: Once | INTRAVENOUS | Status: AC
  Administered 2020-01-11: 500 [IU] via INTRAVENOUS
  Filled 2020-01-11: qty 5

## 2020-01-17 ENCOUNTER — Other Ambulatory Visit: Payer: Self-pay

## 2020-01-18 ENCOUNTER — Inpatient Hospital Stay: Payer: Medicare Other

## 2020-01-18 ENCOUNTER — Inpatient Hospital Stay (HOSPITAL_BASED_OUTPATIENT_CLINIC_OR_DEPARTMENT_OTHER): Payer: Medicare Other | Admitting: Internal Medicine

## 2020-01-18 DIAGNOSIS — C3411 Malignant neoplasm of upper lobe, right bronchus or lung: Secondary | ICD-10-CM | POA: Diagnosis not present

## 2020-01-18 DIAGNOSIS — Z7189 Other specified counseling: Secondary | ICD-10-CM

## 2020-01-18 DIAGNOSIS — Z5111 Encounter for antineoplastic chemotherapy: Secondary | ICD-10-CM | POA: Diagnosis not present

## 2020-01-18 LAB — CBC WITH DIFFERENTIAL/PLATELET
Abs Immature Granulocytes: 0.05 10*3/uL (ref 0.00–0.07)
Basophils Absolute: 0 10*3/uL (ref 0.0–0.1)
Basophils Relative: 1 %
Eosinophils Absolute: 0.2 10*3/uL (ref 0.0–0.5)
Eosinophils Relative: 5 %
HCT: 32.9 % — ABNORMAL LOW (ref 36.0–46.0)
Hemoglobin: 10.3 g/dL — ABNORMAL LOW (ref 12.0–15.0)
Immature Granulocytes: 1 %
Lymphocytes Relative: 18 %
Lymphs Abs: 0.7 10*3/uL (ref 0.7–4.0)
MCH: 27.5 pg (ref 26.0–34.0)
MCHC: 31.3 g/dL (ref 30.0–36.0)
MCV: 88 fL (ref 80.0–100.0)
Monocytes Absolute: 0.4 10*3/uL (ref 0.1–1.0)
Monocytes Relative: 9 %
Neutro Abs: 2.8 10*3/uL (ref 1.7–7.7)
Neutrophils Relative %: 66 %
Platelets: 254 10*3/uL (ref 150–400)
RBC: 3.74 MIL/uL — ABNORMAL LOW (ref 3.87–5.11)
RDW: 17.4 % — ABNORMAL HIGH (ref 11.5–15.5)
WBC: 4.2 10*3/uL (ref 4.0–10.5)
nRBC: 0 % (ref 0.0–0.2)

## 2020-01-18 LAB — COMPREHENSIVE METABOLIC PANEL
ALT: 17 U/L (ref 0–44)
AST: 17 U/L (ref 15–41)
Albumin: 3.3 g/dL — ABNORMAL LOW (ref 3.5–5.0)
Alkaline Phosphatase: 74 U/L (ref 38–126)
Anion gap: 8 (ref 5–15)
BUN: 19 mg/dL (ref 8–23)
CO2: 24 mmol/L (ref 22–32)
Calcium: 9.3 mg/dL (ref 8.9–10.3)
Chloride: 105 mmol/L (ref 98–111)
Creatinine, Ser: 1.41 mg/dL — ABNORMAL HIGH (ref 0.44–1.00)
GFR calc Af Amer: 40 mL/min — ABNORMAL LOW (ref >60)
GFR calc non Af Amer: 35 mL/min — ABNORMAL LOW (ref >60)
Glucose, Bld: 94 mg/dL (ref 70–99)
Potassium: 4.2 mmol/L (ref 3.5–5.1)
Sodium: 137 mmol/L (ref 135–145)
Total Bilirubin: 0.5 mg/dL (ref 0.3–1.2)
Total Protein: 7.4 g/dL (ref 6.5–8.1)

## 2020-01-18 MED ORDER — OXYCODONE HCL 5 MG PO TABS: 5.0000 mg | ORAL_TABLET | Freq: Two times a day (BID) | ORAL | 0 refills | Status: DC | PRN

## 2020-01-18 MED ORDER — SODIUM CHLORIDE 0.9 % IV SOLN
80.0000 mg/m2 | Freq: Once | INTRAVENOUS | Status: AC
  Administered 2020-01-18: 138 mg via INTRAVENOUS
  Filled 2020-01-18: qty 23

## 2020-01-18 MED ORDER — DIPHENHYDRAMINE HCL 50 MG/ML IJ SOLN
25.0000 mg | Freq: Once | INTRAMUSCULAR | Status: AC
  Administered 2020-01-18: 25 mg via INTRAVENOUS
  Filled 2020-01-18: qty 1

## 2020-01-18 MED ORDER — PALONOSETRON HCL INJECTION 0.25 MG/5ML
0.2500 mg | Freq: Once | INTRAVENOUS | Status: AC
  Administered 2020-01-18: 0.25 mg via INTRAVENOUS
  Filled 2020-01-18: qty 5

## 2020-01-18 MED ORDER — SODIUM CHLORIDE 0.9 % IV SOLN
115.4000 mg | Freq: Once | INTRAVENOUS | Status: AC
  Administered 2020-01-18: 120 mg via INTRAVENOUS
  Filled 2020-01-18: qty 12

## 2020-01-18 MED ORDER — SODIUM CHLORIDE 0.9% FLUSH
10.0000 mL | INTRAVENOUS | Status: DC | PRN
  Administered 2020-01-18: 10 mL
  Filled 2020-01-18: qty 10

## 2020-01-18 MED ORDER — HEPARIN SOD (PORK) LOCK FLUSH 100 UNIT/ML IV SOLN
INTRAVENOUS | Status: AC
  Filled 2020-01-18: qty 5

## 2020-01-18 MED ORDER — HEPARIN SOD (PORK) LOCK FLUSH 100 UNIT/ML IV SOLN
500.0000 [IU] | Freq: Once | INTRAVENOUS | Status: AC | PRN
  Administered 2020-01-18: 500 [IU]
  Filled 2020-01-18: qty 5

## 2020-01-18 MED ORDER — SODIUM CHLORIDE 0.9 % IV SOLN
20.0000 mg | Freq: Once | INTRAVENOUS | Status: AC
  Administered 2020-01-18: 20 mg via INTRAVENOUS
  Filled 2020-01-18: qty 20

## 2020-01-18 MED ORDER — FAMOTIDINE IN NACL 20-0.9 MG/50ML-% IV SOLN
20.0000 mg | Freq: Once | INTRAVENOUS | Status: AC
  Administered 2020-01-18: 20 mg via INTRAVENOUS
  Filled 2020-01-18: qty 50

## 2020-01-18 MED ORDER — SODIUM CHLORIDE 0.9 % IV SOLN
Freq: Once | INTRAVENOUS | Status: AC
  Filled 2020-01-18: qty 250

## 2020-01-18 NOTE — Assessment & Plan Note (Addendum)
#   Right upper lobe lung cancer-progressive/metastatic-stage IV; FEB 5th 2021-  CT scan chest-scarring right upper lobe stable; however progressive worsening lesions noted right lower lobe ~centimeter in size; multiple. On pallaitive carbo-taxol weekly.  Clinically stable  # Proceed with #5 carbo-taxol weekly-tolerating chemotherapy with mild to moderate difficulties.. Labs today reviewed;  acceptable for treatment today. CT order- for next week.   #Discussed with patient/son-that is unclear at this time for symptoms [her worsening shortness of breath fatigue/cough]-underlying COPD medical illness versus progressive disease.  I think is reasonable to evaluate with CT scans.  If progressive disease noted-treatment discontinuation could be discussed.  They are in agreement.  #Right chest wall pain-likely secondary to pleurisy/scar tissue chronic-stable new script given.Marland Kitchen   #COPD-overall stable./See above; continue current medication/inhalers.   # DISPOSITION: # Chemo today # 1 week- MD- labs- cbc/bmp; Carbo-Taxol weekly; CT chest prior-Dr.B.

## 2020-01-18 NOTE — Progress Notes (Signed)
[facility] OFFICE PROGRESS NOTE  Patient Care Team: Leonel Ramsay, MD as PCP - General (Infectious Diseases) Telford Nab, RN as Registered Nurse [name], MD as Medical Oncologist (Medical Oncology)  Cancer Staging No matching staging information was found for the patient.   Oncology History Overview Note  # April 2019- [facility] LUNG CANCER-non-small cell; favor squamous cell; PET scan T4N0 [right upper lobe mass involving[mediastinal/blood results] vs small pleural effusion [?M1]- CARBO-TAXOL- RT;   #October 2020-CT progressive subcentimeter right-sided lung lesions;  # NOV 17th 2020- KEYTRUDA q3 W  # 24th FEB 2021- Carbo-Taxol weekly-   # Hx of Right breast ca [s/p lumpec; RT; No chemo; "pill" x5 years]  # COPD/Hx of smoking  # PALLIATIVE CARE: 12/19/2019- DELCINED  MOLECULAR TESTING/OMNISEQ: PDL-1 25% [TPS; TMB-H; No targets**]  --------------------------------------------------    DIAGNOSIS: [ April 2019]SQUAMOUS CELL LUNG CA  STAGE: IV/Recurrent ;GOALS: PALLIATIVE  CURRENT/MOST RECENT THERAPY- carbo-Taxol [C] .     Primary cancer of right upper lobe of lung ([place])  03/13/2018 - 05/15/2018 Chemotherapy   The patient had palonosetron (ALOXI) injection 0.25 mg, 0.25 mg, Intravenous,  Once, 9 of 9 cycles Administration: 0.25 mg (03/13/2018), 0.25 mg (04/10/2018), 0.25 mg (04/17/2018), 0.25 mg (03/21/2018), 0.25 mg (04/25/2018), 0.25 mg (03/27/2018), 0.25 mg (04/03/2018), 0.25 mg (05/01/2018), 0.25 mg (05/08/2018) CARBOplatin (PARAPLATIN) 130 mg in sodium chloride 0.9 % 250 mL chemo infusion, 130 mg (100 % of original dose 131.4 mg), Intravenous,  Once, 9 of 9 cycles Dose modification:   (original dose 131.4 mg, Cycle 1) Administration: 130 mg (03/13/2018), 130 mg (04/10/2018), 130 mg (04/17/2018), 130 mg (03/21/2018), 130 mg (04/25/2018), 130 mg (03/27/2018), 130 mg (04/03/2018), 130 mg (05/01/2018), 130 mg (05/08/2018) PACLitaxel (TAXOL) 78 mg in sodium chloride  0.9 % 250 mL chemo infusion (</= 74m/m2), 45 mg/m2 = 78 mg, Intravenous,  Once, 9 of 9 cycles Administration: 78 mg (03/13/2018), 78 mg (04/10/2018), 78 mg (04/17/2018), 78 mg (03/21/2018), 78 mg (04/25/2018), 78 mg (03/27/2018), 78 mg (04/03/2018), 78 mg (05/01/2018), 78 mg (05/08/2018)  for chemotherapy treatment.    09/12/2019 - 12/04/2019 Chemotherapy   The patient had pembrolizumab (KEYTRUDA) 200 mg in sodium chloride 0.9 % 50 mL chemo infusion, 200 mg, Intravenous, Once, 4 of 6 cycles Administration: 200 mg (09/12/2019), 200 mg (10/03/2019), 200 mg (10/24/2019), 200 mg (11/14/2019)  for chemotherapy treatment.    12/19/2019 -  Chemotherapy   The patient had palonosetron (ALOXI) injection 0.25 mg, 0.25 mg, Intravenous,  Once, 5 of 8 cycles Administration: 0.25 mg (12/19/2019), 0.25 mg (12/28/2019), 0.25 mg (01/18/2020), 0.25 mg (01/04/2020), 0.25 mg (01/11/2020) CARBOplatin (PARAPLATIN) 120 mg in sodium chloride 0.9 % 100 mL chemo infusion, 120 mg (100 % of original dose 122 mg), Intravenous,  Once, 5 of 8 cycles Dose modification:   (original dose 122 mg, Cycle 1) Administration: 120 mg (12/19/2019), 120 mg (12/28/2019), 120 mg (01/18/2020), 120 mg (01/04/2020), 120 mg (01/11/2020) PACLitaxel (TAXOL) 138 mg in sodium chloride 0.9 % 250 mL chemo infusion (</= 830mm2), 80 mg/m2 = 138 mg, Intravenous,  Once, 5 of 8 cycles Administration: 138 mg (12/19/2019), 138 mg (12/28/2019), 138 mg (01/18/2020), 138 mg (01/04/2020), 138 mg (01/11/2020)  for chemotherapy treatment.        INTERVAL HISTORY: Patient a poor historian.  B[facility] Simmering89.o.  female patient with recurrent/metastatic squamous cell lung cancer-currently on weekly carbotaxol is here for follow-up.  She is accompanied by her son today.  As per the son patient  has been having more fatigue.  Also complains of worsening cough and worsening shortness of breath.  Appetite is fair.  Positive for mild weight loss.   Review of Systems  Constitutional: Positive  for malaise/fatigue. Negative for chills, diaphoresis and fever.  HENT: Negative for nosebleeds and sore throat.   Eyes: Negative for double vision.  Respiratory: Positive for cough and shortness of breath. Negative for hemoptysis, sputum production and wheezing.   Cardiovascular: Negative for chest pain, palpitations and orthopnea.  Gastrointestinal: Negative for abdominal pain, blood in stool, heartburn, melena, nausea and vomiting.  Genitourinary: Negative for dysuria, frequency and urgency.  Musculoskeletal: Positive for joint pain. Negative for back pain.  Skin: Negative.  Negative for itching and rash.  Neurological: Negative for tingling, focal weakness, weakness and headaches.  Endo/Heme/Allergies: Does not bruise/bleed easily.  Psychiatric/Behavioral: Negative for depression. The patient is not nervous/anxious and does not have insomnia.     PAST MEDICAL HISTORY :  Past Medical History:  Diagnosis Date  . Benign liver cyst   . Breast cancer ([place]) 2005   right breast  . COPD (chronic obstructive pulmonary disease) ([place])   . Dyspnea   . Glaucoma   . Hemoptysis 2019  . Hypertension    Not on medication for htn. patient denies this  . Lung mass 01/2018  . Personal history of radiation therapy     PAST SURGICAL HISTORY :   Past Surgical History:  Procedure Laterality Date  . BREAST EXCISIONAL BIOPSY Right 2005   positive, radiation  . BREAST LUMPECTOMY Right 03/2004  . CHOLECYSTECTOMY  1988  . COLONOSCOPY  2012  . ENDOBRONCHIAL ULTRASOUND N/A 02/21/2018   Procedure: ENDOBRONCHIAL ULTRASOUND;  Surgeon: Laverle Hobby, MD;  Location: [facility] ORS;  Service: Pulmonary;  Laterality: N/A;  . EYE SURGERY Bilateral 2009,2010   cataract extractions with lens implant  . GALLBLADDER SURGERY Bilateral   . IR FLUORO GUIDE PORT INSERTION RIGHT  03/10/2018  . JOINT REPLACEMENT Left 2008   left hip replacement  . PORTACATH PLACEMENT  03/10/2018  . TONSILLECTOMY  1960    FAMILY  HISTORY :   Family History  Problem Relation Age of Onset  . Breast cancer Mother 11  . CAD Father     SOCIAL HISTORY:   Social History   Tobacco Use  . Smoking status: Former Smoker    Packs/day: 1.00    Types: Cigarettes    Quit date: 10/25/2005    Years since quitting: 14.2  . Smokeless tobacco: Never Used  Substance Use Topics  . Alcohol use: Not Currently    Comment: occasional glass of beer  . Drug use: Never    ALLERGIES:  is allergic to penicillins.  MEDICATIONS:  Current Outpatient Medications  Medication Sig Dispense Refill  . albuterol (PROVENTIL HFA) 108 (90 Base) MCG/ACT inhaler Inhale 2 puffs into the lungs every 6 (six) hours as needed for wheezing or shortness of breath.     . Cholecalciferol (VITAMIN D3) 1000 units CAPS Take 3,000 Units by mouth daily.     Marland Kitchen latanoprost (XALATAN) 0.005 % ophthalmic solution Apply 1 drop to eye daily.    . ondansetron (ZOFRAN) 8 MG tablet One pill every 8 hours as needed for nausea/vomitting. 40 tablet 1  . oxyCODONE (OXY IR/ROXICODONE) 5 MG immediate release tablet Take 1 tablet (5 mg total) by mouth 2 (two) times daily as needed for moderate pain or severe pain. 30 tablet 0  . traMADol (ULTRAM) 50 MG tablet Take 50 mg  by mouth 2 (two) times daily as needed.    . TRELEGY ELLIPTA 100-62.5-25 MCG/INH AEPB      No current facility-administered medications for this visit.   Facility-Administered Medications Ordered in Other Visits  Medication Dose Route Frequency Provider Last Rate Last Admin  . sodium chloride flush (NS) 0.9 % injection 10 mL  10 mL Intracatheter PRN [name], MD   10 mL at 01/18/20 0958    PHYSICAL EXAMINATION: ECOG PERFORMANCE STATUS: 1 - Symptomatic but completely ambulatory  BP 93/61 (BP Location: Left Arm, Patient Position: Sitting, Cuff Size: Normal)   Pulse 98   Temp (!) 96.9 F (36.1 C) (Tympanic)   Wt 144 lb (65.3 kg)   BMI 24.72 kg/m   Filed Weights   01/18/20 0842  Weight:  144 lb (65.3 kg)    Physical Exam  Constitutional: She is oriented to person, place, and time.  Frail-appearing Caucasian female patient..  She is walking herself.  HENT:  Head: Normocephalic and atraumatic.  Mouth/Throat: Oropharynx is clear and moist. No oropharyngeal exudate.  Eyes: Pupils are equal, round, and reactive to light.  Cardiovascular: Normal rate and regular rhythm.  Pulmonary/Chest: No respiratory distress. She has no wheezes.  Decreased breath sounds in the right compared to the left.  Abdominal: Soft. Bowel sounds are normal. She exhibits no distension and no mass. There is no abdominal tenderness. There is no rebound and no guarding.  Musculoskeletal:        General: No tenderness or edema. Normal range of motion.     Cervical back: Normal range of motion and neck supple.  Neurological: She is alert and oriented to person, place, and time.  Skin: Skin is warm.  Psychiatric: Affect normal.    LABORATORY DATA:  I have reviewed the data as listed    Component Value Date/Time   NA 137 01/18/2020 0821   K 4.2 01/18/2020 0821   CL 105 01/18/2020 0821   CO2 24 01/18/2020 0821   GLUCOSE 94 01/18/2020 0821   BUN 19 01/18/2020 0821   CREATININE 1.41 (H) 01/18/2020 0821   CALCIUM 9.3 01/18/2020 0821   PROT 7.4 01/18/2020 0821   ALBUMIN 3.3 (L) 01/18/2020 0821   AST 17 01/18/2020 0821   ALT 17 01/18/2020 0821   ALKPHOS 74 01/18/2020 0821   BILITOT 0.5 01/18/2020 0821   GFRNONAA 35 (L) 01/18/2020 0821   GFRAA 40 (L) 01/18/2020 0821    No results found for: SPEP, UPEP  Lab Results  Component Value Date   WBC 4.2 01/18/2020   NEUTROABS 2.8 01/18/2020   HGB 10.3 (L) 01/18/2020   HCT 32.9 (L) 01/18/2020   MCV 88.0 01/18/2020   PLT 254 01/18/2020      Chemistry      Component Value Date/Time   NA 137 01/18/2020 0821   K 4.2 01/18/2020 0821   CL 105 01/18/2020 0821   CO2 24 01/18/2020 0821   BUN 19 01/18/2020 0821   CREATININE 1.41 (H) 01/18/2020 0821       Component Value Date/Time   CALCIUM 9.3 01/18/2020 0821   ALKPHOS 74 01/18/2020 0821   AST 17 01/18/2020 0821   ALT 17 01/18/2020 0821   BILITOT 0.5 01/18/2020 0821       RADIOGRAPHIC STUDIES: I have personally reviewed the radiological images as listed and agreed with the findings in the report. No results found.   ASSESSMENT & PLAN:  Primary cancer of right upper lobe of lung ([place]) # Right  upper lobe lung cancer-progressive/metastatic-stage IV; FEB 5th 2021-  CT scan chest-scarring right upper lobe stable; however progressive worsening lesions noted right lower lobe ~centimeter in size; multiple. On pallaitive carbo-taxol weekly.  Clinically stable  # Proceed with #5 carbo-taxol weekly-tolerating chemotherapy with mild to moderate difficulties.. Labs today reviewed;  acceptable for treatment today. CT order- for next week.   #Discussed with patient/son-that is unclear at this time for symptoms [her worsening shortness of breath fatigue/cough]-underlying COPD medical illness versus progressive disease.  I think is reasonable to evaluate with CT scans.  If progressive disease noted-treatment discontinuation could be discussed.  They are in agreement.  #Right chest wall pain-likely secondary to pleurisy/scar tissue chronic-stable new script given.Marland Kitchen   #COPD-overall stable./See above; continue current medication/inhalers.   # DISPOSITION: # Chemo today # 1 week- MD- labs- cbc/bmp; Carbo-Taxol weekly; CT chest prior-Dr.B.   Orders Placed This Encounter  Procedures  . CT CHEST WO CONTRAST    Standing Status:   Future    Standing Expiration Date:   01/17/2021    Order Specific Question:   Preferred imaging location?    Answer:   [facility]    Order Specific Question:   Radiology Contrast Protocol - do NOT remove file path    Answer:   [email or web address]    Order Specific Question:   ** REASON FOR EXAM (FREE TEXT)    Answer:   lung cancer    All questions were answered. The patient knows to call the clinic with any problems, questions or concerns.      [name], MD 01/18/2020 12:44 PM

## 2020-01-22 ENCOUNTER — Other Ambulatory Visit: Payer: Self-pay

## 2020-01-22 ENCOUNTER — Ambulatory Visit
Admission: RE | Admit: 2020-01-22 | Discharge: 2020-01-22 | Disposition: A | Discharge status: Home / Self Care | Payer: Medicare Other | Source: Ambulatory Visit | Attending: Internal Medicine | Admitting: Internal Medicine

## 2020-01-22 DIAGNOSIS — C3411 Malignant neoplasm of upper lobe, right bronchus or lung: Secondary | ICD-10-CM | POA: Diagnosis not present

## 2020-01-25 ENCOUNTER — Inpatient Hospital Stay: Payer: Medicare Other | Attending: Internal Medicine

## 2020-01-25 ENCOUNTER — Inpatient Hospital Stay: Payer: Medicare Other

## 2020-01-25 ENCOUNTER — Inpatient Hospital Stay (HOSPITAL_BASED_OUTPATIENT_CLINIC_OR_DEPARTMENT_OTHER): Payer: Medicare Other | Admitting: Internal Medicine

## 2020-01-25 ENCOUNTER — Encounter: Payer: Self-pay | Admitting: Internal Medicine

## 2020-01-25 ENCOUNTER — Other Ambulatory Visit: Payer: Self-pay

## 2020-01-25 DIAGNOSIS — Z79899 Other long term (current) drug therapy: Secondary | ICD-10-CM | POA: Diagnosis not present

## 2020-01-25 DIAGNOSIS — C3411 Malignant neoplasm of upper lobe, right bronchus or lung: Secondary | ICD-10-CM

## 2020-01-25 DIAGNOSIS — J449 Chronic obstructive pulmonary disease, unspecified: Secondary | ICD-10-CM | POA: Diagnosis not present

## 2020-01-25 DIAGNOSIS — R0789 Other chest pain: Secondary | ICD-10-CM | POA: Diagnosis not present

## 2020-01-25 DIAGNOSIS — Z5111 Encounter for antineoplastic chemotherapy: Secondary | ICD-10-CM | POA: Insufficient documentation

## 2020-01-25 LAB — CBC WITH DIFFERENTIAL/PLATELET
Abs Immature Granulocytes: 0.05 10*3/uL (ref 0.00–0.07)
Basophils Absolute: 0 10*3/uL (ref 0.0–0.1)
Basophils Relative: 1 %
Eosinophils Absolute: 0.2 10*3/uL (ref 0.0–0.5)
Eosinophils Relative: 3 %
HCT: 32.7 % — ABNORMAL LOW (ref 36.0–46.0)
Hemoglobin: 10.3 g/dL — ABNORMAL LOW (ref 12.0–15.0)
Immature Granulocytes: 1 %
Lymphocytes Relative: 30 %
Lymphs Abs: 1.4 10*3/uL (ref 0.7–4.0)
MCH: 27.5 pg (ref 26.0–34.0)
MCHC: 31.5 g/dL (ref 30.0–36.0)
MCV: 87.4 fL (ref 80.0–100.0)
Monocytes Absolute: 0.5 10*3/uL (ref 0.1–1.0)
Monocytes Relative: 11 %
Neutro Abs: 2.5 10*3/uL (ref 1.7–7.7)
Neutrophils Relative %: 54 %
Platelets: 286 10*3/uL (ref 150–400)
RBC: 3.74 MIL/uL — ABNORMAL LOW (ref 3.87–5.11)
RDW: 18.4 % — ABNORMAL HIGH (ref 11.5–15.5)
Smear Review: NORMAL
WBC: 4.7 10*3/uL (ref 4.0–10.5)
nRBC: 0 % (ref 0.0–0.2)

## 2020-01-25 LAB — COMPREHENSIVE METABOLIC PANEL
ALT: 16 U/L (ref 0–44)
AST: 17 U/L (ref 15–41)
Albumin: 3.3 g/dL — ABNORMAL LOW (ref 3.5–5.0)
Alkaline Phosphatase: 69 U/L (ref 38–126)
Anion gap: 10 (ref 5–15)
BUN: 17 mg/dL (ref 8–23)
CO2: 24 mmol/L (ref 22–32)
Calcium: 9 mg/dL (ref 8.9–10.3)
Chloride: 104 mmol/L (ref 98–111)
Creatinine, Ser: 1.23 mg/dL — ABNORMAL HIGH (ref 0.44–1.00)
GFR calc Af Amer: 48 mL/min — ABNORMAL LOW (ref >60)
GFR calc non Af Amer: 41 mL/min — ABNORMAL LOW (ref >60)
Glucose, Bld: 104 mg/dL — ABNORMAL HIGH (ref 70–99)
Potassium: 3.8 mmol/L (ref 3.5–5.1)
Sodium: 138 mmol/L (ref 135–145)
Total Bilirubin: 0.4 mg/dL (ref 0.3–1.2)
Total Protein: 7.2 g/dL (ref 6.5–8.1)

## 2020-01-25 MED ORDER — HEPARIN SOD (PORK) LOCK FLUSH 100 UNIT/ML IV SOLN
500.0000 [IU] | Freq: Once | INTRAVENOUS | Status: DC
  Filled 2020-01-25: qty 5

## 2020-01-25 MED ORDER — HEPARIN SOD (PORK) LOCK FLUSH 100 UNIT/ML IV SOLN
INTRAVENOUS | Status: AC
  Filled 2020-01-25: qty 5

## 2020-01-25 NOTE — Assessment & Plan Note (Addendum)
#   Right upper lobe lung cancer-progressive/metastatic-stage IV; carbotaxol weekly x5 treatments; CT scan March 30-stable lung nodules/mild increased; however progressive liver lesion ~4 cm.   #Hold carbotaxol No. 6 chemotherapy today-given the progressive disease.  #I have a long discussion with the patient and her son regarding the concerns of progressive disease noted on the CAT scan.  Discussed the option of subsequent line of chemotherapy with gemcitabine approximately 20% response rates.  Again treatments are palliative.  Again reviewed the potential for significant but not limited to skin rash drop in blood counts fevers etc.  Discussed that if patient noted to have significant side effects or worsening quality of life-then we would recommend no further therapy.   #Right chest wall pain-likely secondary to pleurisy/scar tissue chronic-stable.  #COPD-overall stable./See above; continue current medication/inhalers.   # DISPOSITION: # HOLD Chemo today; # 1 week- MD- labs- Cbc/Cmp; Gemcitabine [new] -Dr.B.  # I reviewed the blood work- with the patient in detail; also reviewed the imaging independently [as summarized above]; and with the patient in detail.  # 40 minutes face-to-face with the patient discussing the above plan of care; more than 50% of time spent on prognosis/ natural history; counseling and coordination.

## 2020-01-25 NOTE — Progress Notes (Signed)
DISCONTINUE ON PATHWAY REGIMEN - Non-Small Cell Lung     A cycle is every 21 days:     Paclitaxel      Carboplatin   **Always confirm dose/schedule in your pharmacy ordering system**  REASON: Disease Progression PRIOR TREATMENT: [id number]: Carboplatin AUC=6 + Paclitaxel 200 mg/m2 q21 Days x 4-6 Cycles TREATMENT RESPONSE: Progressive Disease (PD)  START ON PATHWAY REGIMEN - Non-Small Cell Lung     A cycle is every 21 days:     Ramucirumab      Docetaxel   **Always confirm dose/schedule in your pharmacy ordering system**  Patient Characteristics: Stage IV Metastatic, Squamous, PS = 0, 1, Third Line, Prior PD-1/PD-L1 Inhibitor or No Prior PD-1/PD-L1 Inhibitor and Not a Candidate for Immunotherapy Therapeutic Status: Stage IV Metastatic Histology: Squamous Cell Line of therapy: Third Engineer, maintenance (IT) Status: 1 PD-L1 Expression Status: PD-L1 Positive 1-49% (TPS) Immunotherapy Candidate Status: Not a Candidate for Immunotherapy Prior Immunotherapy Status: Prior PD-1/PD-L1 Inhibitor Intent of Therapy: Non-Curative / Palliative Intent, Discussed with Patient

## 2020-01-25 NOTE — Progress Notes (Signed)
[place] OFFICE PROGRESS NOTE  Patient Care Team: Leonel Ramsay, MD as PCP - General (Infectious Diseases) Telford Nab, RN as Registered Nurse [name], MD as Medical Oncologist (Medical Oncology)  Cancer Staging No matching staging information was found for the patient.   Oncology History Overview Note  # April 2019- RUL LUNG CANCER-non-small cell; favor squamous cell; PET scan T4N0 [right upper lobe mass involving[mediastinal/blood results] vs small pleural effusion [?M1]- CARBO-TAXOL- RT;   #October 2020-CT progressive subcentimeter right-sided lung lesions;  # NOV 17th 2020- KEYTRUDA q3 W  # 24th FEB 2021- Carbo-Taxol weekly-   # Hx of Right breast ca [s/p lumpec; RT; No chemo; "pill" x5 years]  # COPD/Hx of smoking  # PALLIATIVE CARE: 12/19/2019- DELCINED  MOLECULAR TESTING/OMNISEQ: PDL-1 25% [TPS; TMB-H; No targets**]  --------------------------------------------------    DIAGNOSIS: [ April 2019]SQUAMOUS CELL LUNG CA  STAGE: IV/Recurrent ;GOALS: PALLIATIVE  CURRENT/MOST RECENT THERAPY- carbo-Taxol [C] .     Primary cancer of right upper lobe of lung ([place])  03/13/2018 - 05/15/2018 Chemotherapy   The patient had palonosetron (ALOXI) injection 0.25 mg, 0.25 mg, Intravenous,  Once, 9 of 9 cycles Administration: 0.25 mg (03/13/2018), 0.25 mg (04/10/2018), 0.25 mg (04/17/2018), 0.25 mg (03/21/2018), 0.25 mg (04/25/2018), 0.25 mg (03/27/2018), 0.25 mg (04/03/2018), 0.25 mg (05/01/2018), 0.25 mg (05/08/2018) CARBOplatin (PARAPLATIN) 130 mg in sodium chloride 0.9 % 250 mL chemo infusion, 130 mg (100 % of original dose 131.4 mg), Intravenous,  Once, 9 of 9 cycles Dose modification:   (original dose 131.4 mg, Cycle 1) Administration: 130 mg (03/13/2018), 130 mg (04/10/2018), 130 mg (04/17/2018), 130 mg (03/21/2018), 130 mg (04/25/2018), 130 mg (03/27/2018), 130 mg (04/03/2018), 130 mg (05/01/2018), 130 mg (05/08/2018) PACLitaxel (TAXOL) 78 mg in sodium chloride  0.9 % 250 mL chemo infusion (</= 11m/m2), 45 mg/m2 = 78 mg, Intravenous,  Once, 9 of 9 cycles Administration: 78 mg (03/13/2018), 78 mg (04/10/2018), 78 mg (04/17/2018), 78 mg (03/21/2018), 78 mg (04/25/2018), 78 mg (03/27/2018), 78 mg (04/03/2018), 78 mg (05/01/2018), 78 mg (05/08/2018)  for chemotherapy treatment.    09/12/2019 - 12/04/2019 Chemotherapy   The patient had pembrolizumab (KEYTRUDA) 200 mg in sodium chloride 0.9 % 50 mL chemo infusion, 200 mg, Intravenous, Once, 4 of 6 cycles Administration: 200 mg (09/12/2019), 200 mg (10/03/2019), 200 mg (10/24/2019), 200 mg (11/14/2019)  for chemotherapy treatment.    12/19/2019 - 01/24/2020 Chemotherapy   The patient had palonosetron (ALOXI) injection 0.25 mg, 0.25 mg, Intravenous,  Once, 5 of 8 cycles Administration: 0.25 mg (12/19/2019), 0.25 mg (12/28/2019), 0.25 mg (01/18/2020), 0.25 mg (01/04/2020), 0.25 mg (01/11/2020) CARBOplatin (PARAPLATIN) 120 mg in sodium chloride 0.9 % 100 mL chemo infusion, 120 mg (100 % of original dose 122 mg), Intravenous,  Once, 5 of 8 cycles Dose modification:   (original dose 122 mg, Cycle 1) Administration: 120 mg (12/19/2019), 120 mg (12/28/2019), 120 mg (01/18/2020), 120 mg (01/04/2020), 120 mg (01/11/2020) PACLitaxel (TAXOL) 138 mg in sodium chloride 0.9 % 250 mL chemo infusion (</= 840mm2), 80 mg/m2 = 138 mg, Intravenous,  Once, 5 of 8 cycles Administration: 138 mg (12/19/2019), 138 mg (12/28/2019), 138 mg (01/18/2020), 138 mg (01/04/2020), 138 mg (01/11/2020)  for chemotherapy treatment.    02/01/2020 -  Chemotherapy   The patient had gemcitabine (GEMZAR) 1,710 mg in sodium chloride 0.9 % 250 mL chemo infusion, 1,000 mg/m2, Intravenous,  Once, 0 of 4 cycles  for chemotherapy treatment.        INTERVAL HISTORY: Patient a  poor historian.  Brenda Parker 84 y.o.  female patient with recurrent/metastatic squamous cell lung cancer-currently on weekly carbotaxol is here for follow-up-reviewed results of the CT scan.  She is accompanied by  her son today.  Patient continues to feel intermittent fatigue.  Denies any worsening shortness of breath.  No worsening cough.  Appetite is fair.  Weight stable.  Review of Systems  Constitutional: Positive for malaise/fatigue. Negative for chills, diaphoresis and fever.  HENT: Negative for nosebleeds and sore throat.   Eyes: Negative for double vision.  Respiratory: Positive for cough and shortness of breath. Negative for hemoptysis, sputum production and wheezing.   Cardiovascular: Negative for chest pain, palpitations and orthopnea.  Gastrointestinal: Negative for abdominal pain, blood in stool, heartburn, melena, nausea and vomiting.  Genitourinary: Negative for dysuria, frequency and urgency.  Musculoskeletal: Positive for joint pain. Negative for back pain.  Skin: Negative.  Negative for itching and rash.  Neurological: Negative for tingling, focal weakness, weakness and headaches.  Endo/Heme/Allergies: Does not bruise/bleed easily.  Psychiatric/Behavioral: Negative for depression. The patient is not nervous/anxious and does not have insomnia.     PAST MEDICAL HISTORY :  Past Medical History:  Diagnosis Date  . Benign liver cyst   . Breast cancer ([place]) 2005   right breast  . COPD (chronic obstructive pulmonary disease) ([place])   . Dyspnea   . Glaucoma   . Hemoptysis 2019  . Hypertension    Not on medication for htn. patient denies this  . Lung mass 01/2018  . Personal history of radiation therapy     PAST SURGICAL HISTORY :   Past Surgical History:  Procedure Laterality Date  . BREAST EXCISIONAL BIOPSY Right 2005   positive, radiation  . BREAST LUMPECTOMY Right 03/2004  . CHOLECYSTECTOMY  1988  . COLONOSCOPY  2012  . ENDOBRONCHIAL ULTRASOUND N/A 02/21/2018   Procedure: ENDOBRONCHIAL ULTRASOUND;  Surgeon: Laverle Hobby, MD;  Location: [facility] ORS;  Service: Pulmonary;  Laterality: N/A;  . EYE SURGERY Bilateral 2009,2010   cataract extractions with lens implant   . GALLBLADDER SURGERY Bilateral   . IR FLUORO GUIDE PORT INSERTION RIGHT  03/10/2018  . JOINT REPLACEMENT Left 2008   left hip replacement  . PORTACATH PLACEMENT  03/10/2018  . TONSILLECTOMY  1960    FAMILY HISTORY :   Family History  Problem Relation Age of Onset  . Breast cancer Mother 83  . CAD Father     SOCIAL HISTORY:   Social History   Tobacco Use  . Smoking status: Former Smoker    Packs/day: 1.00    Types: Cigarettes    Quit date: 10/25/2005    Years since quitting: 14.2  . Smokeless tobacco: Never Used  Substance Use Topics  . Alcohol use: Not Currently    Comment: occasional glass of beer  . Drug use: Never    ALLERGIES:  is allergic to penicillins.  MEDICATIONS:  Current Outpatient Medications  Medication Sig Dispense Refill  . albuterol (PROVENTIL HFA) 108 (90 Base) MCG/ACT inhaler Inhale 2 puffs into the lungs every 6 (six) hours as needed for wheezing or shortness of breath.     . Cholecalciferol (VITAMIN D3) 1000 units CAPS Take 3,000 Units by mouth daily.     Marland Kitchen latanoprost (XALATAN) 0.005 % ophthalmic solution Apply 1 drop to eye daily.    . ondansetron (ZOFRAN) 8 MG tablet One pill every 8 hours as needed for nausea/vomitting. 40 tablet 1  . oxyCODONE (OXY IR/ROXICODONE) 5 MG immediate  release tablet Take 1 tablet (5 mg total) by mouth 2 (two) times daily as needed for moderate pain or severe pain. 30 tablet 0  . traMADol (ULTRAM) 50 MG tablet Take 50 mg by mouth 2 (two) times daily as needed.    . TRELEGY ELLIPTA 100-62.5-25 MCG/INH AEPB      No current facility-administered medications for this visit.   Facility-Administered Medications Ordered in Other Visits  Medication Dose Route Frequency Provider Last Rate Last Admin  . heparin lock flush 100 unit/mL  500 Units Intracatheter Once [name], MD        PHYSICAL EXAMINATION: ECOG PERFORMANCE STATUS: 1 - Symptomatic but completely ambulatory  BP 111/65 (BP Location: Left Arm,  Patient Position: Sitting, Cuff Size: Normal)   Pulse 98   Temp (!) 95.1 F (35.1 C) (Tympanic)   Wt 144 lb (65.3 kg)   SpO2 95% Comment: room air  BMI 24.72 kg/m   Filed Weights   01/25/20 0839  Weight: 144 lb (65.3 kg)    Physical Exam  Constitutional: She is oriented to person, place, and time.  Frail-appearing Caucasian female patient..  She is walking herself.  HENT:  Head: Normocephalic and atraumatic.  Mouth/Throat: Oropharynx is clear and moist. No oropharyngeal exudate.  Eyes: Pupils are equal, round, and reactive to light.  Cardiovascular: Normal rate and regular rhythm.  Pulmonary/Chest: No respiratory distress. She has no wheezes.  Decreased breath sounds in the right compared to the left.  Abdominal: Soft. Bowel sounds are normal. She exhibits no distension and no mass. There is no abdominal tenderness. There is no rebound and no guarding.  Musculoskeletal:        General: No tenderness or edema. Normal range of motion.     Cervical back: Normal range of motion and neck supple.  Neurological: She is alert and oriented to person, place, and time.  Skin: Skin is warm.  Psychiatric: Affect normal.    LABORATORY DATA:  I have reviewed the data as listed    Component Value Date/Time   NA 138 01/25/2020 0814   K 3.8 01/25/2020 0814   CL 104 01/25/2020 0814   CO2 24 01/25/2020 0814   GLUCOSE 104 (H) 01/25/2020 0814   BUN 17 01/25/2020 0814   CREATININE 1.23 (H) 01/25/2020 0814   CALCIUM 9.0 01/25/2020 0814   PROT 7.2 01/25/2020 0814   ALBUMIN 3.3 (L) 01/25/2020 0814   AST 17 01/25/2020 0814   ALT 16 01/25/2020 0814   ALKPHOS 69 01/25/2020 0814   BILITOT 0.4 01/25/2020 0814   GFRNONAA 41 (L) 01/25/2020 0814   GFRAA 48 (L) 01/25/2020 0814    No results found for: SPEP, UPEP  Lab Results  Component Value Date   WBC 4.7 01/25/2020   NEUTROABS 2.5 01/25/2020   HGB 10.3 (L) 01/25/2020   HCT 32.7 (L) 01/25/2020   MCV 87.4 01/25/2020   PLT 286 01/25/2020       Chemistry      Component Value Date/Time   NA 138 01/25/2020 0814   K 3.8 01/25/2020 0814   CL 104 01/25/2020 0814   CO2 24 01/25/2020 0814   BUN 17 01/25/2020 0814   CREATININE 1.23 (H) 01/25/2020 0814      Component Value Date/Time   CALCIUM 9.0 01/25/2020 0814   ALKPHOS 69 01/25/2020 0814   AST 17 01/25/2020 0814   ALT 16 01/25/2020 0814   BILITOT 0.4 01/25/2020 0814       RADIOGRAPHIC STUDIES: I have  personally reviewed the radiological images as listed and agreed with the findings in the report. No results found.   ASSESSMENT & PLAN:  Primary cancer of right upper lobe of lung ([place]) # Right upper lobe lung cancer-progressive/metastatic-stage IV; carbotaxol weekly x5 treatments; CT scan March 30-stable lung nodules/mild increased; however progressive liver lesion ~4 cm.   #Hold carbotaxol No. 6 chemotherapy today-given the progressive disease.  #I have a long discussion with the patient and her son regarding the concerns of progressive disease noted on the CAT scan.  Discussed the option of subsequent line of chemotherapy with gemcitabine approximately 20% response rates.  Again treatments are palliative.  Again reviewed the potential for significant but not limited to skin rash drop in blood counts fevers etc.  Discussed that if patient noted to have significant side effects or worsening quality of life-then we would recommend no further therapy.   #Right chest wall pain-likely secondary to pleurisy/scar tissue chronic-stable.  #COPD-overall stable./See above; continue current medication/inhalers.   # DISPOSITION: # HOLD Chemo today; # 1 week- MD- labs- Cbc/Cmp; Gemcitabine [new] -Dr.B.  # I reviewed the blood work- with the patient in detail; also reviewed the imaging independently [as summarized above]; and with the patient in detail.     No orders of the defined types were placed in this encounter.  All questions were answered. The patient knows to call  the clinic with any problems, questions or concerns.      [name], MD 01/25/2020 10:30 AM

## 2020-01-28 ENCOUNTER — Telehealth: Payer: Self-pay | Admitting: Primary Care

## 2020-01-28 NOTE — Telephone Encounter (Signed)
Message sent to PCP and oncology to find out if patient would like to continue with palliative services. No answer/call back in order to schedule.

## 2020-02-01 ENCOUNTER — Encounter: Payer: Self-pay | Admitting: Internal Medicine

## 2020-02-01 ENCOUNTER — Other Ambulatory Visit: Payer: Self-pay

## 2020-02-01 ENCOUNTER — Inpatient Hospital Stay: Payer: Medicare Other

## 2020-02-01 ENCOUNTER — Inpatient Hospital Stay (HOSPITAL_BASED_OUTPATIENT_CLINIC_OR_DEPARTMENT_OTHER): Payer: Medicare Other | Admitting: Internal Medicine

## 2020-02-01 DIAGNOSIS — C3411 Malignant neoplasm of upper lobe, right bronchus or lung: Secondary | ICD-10-CM

## 2020-02-01 DIAGNOSIS — Z5111 Encounter for antineoplastic chemotherapy: Secondary | ICD-10-CM | POA: Diagnosis not present

## 2020-02-01 DIAGNOSIS — Z7189 Other specified counseling: Secondary | ICD-10-CM

## 2020-02-01 LAB — CBC WITH DIFFERENTIAL/PLATELET
Abs Immature Granulocytes: 0.05 10*3/uL (ref 0.00–0.07)
Basophils Absolute: 0 10*3/uL (ref 0.0–0.1)
Basophils Relative: 1 %
Eosinophils Absolute: 0.2 10*3/uL (ref 0.0–0.5)
Eosinophils Relative: 3 %
HCT: 33.7 % — ABNORMAL LOW (ref 36.0–46.0)
Hemoglobin: 10.4 g/dL — ABNORMAL LOW (ref 12.0–15.0)
Immature Granulocytes: 1 %
Lymphocytes Relative: 20 %
Lymphs Abs: 1.1 10*3/uL (ref 0.7–4.0)
MCH: 27.6 pg (ref 26.0–34.0)
MCHC: 30.9 g/dL (ref 30.0–36.0)
MCV: 89.4 fL (ref 80.0–100.0)
Monocytes Absolute: 0.6 10*3/uL (ref 0.1–1.0)
Monocytes Relative: 12 %
Neutro Abs: 3.5 10*3/uL (ref 1.7–7.7)
Neutrophils Relative %: 63 %
Platelets: 235 10*3/uL (ref 150–400)
RBC: 3.77 MIL/uL — ABNORMAL LOW (ref 3.87–5.11)
RDW: 19.4 % — ABNORMAL HIGH (ref 11.5–15.5)
WBC: 5.4 10*3/uL (ref 4.0–10.5)
nRBC: 0 % (ref 0.0–0.2)

## 2020-02-01 LAB — COMPREHENSIVE METABOLIC PANEL
ALT: 10 U/L (ref 0–44)
AST: 15 U/L (ref 15–41)
Albumin: 3.2 g/dL — ABNORMAL LOW (ref 3.5–5.0)
Alkaline Phosphatase: 68 U/L (ref 38–126)
Anion gap: 10 (ref 5–15)
BUN: 20 mg/dL (ref 8–23)
CO2: 23 mmol/L (ref 22–32)
Calcium: 9 mg/dL (ref 8.9–10.3)
Chloride: 105 mmol/L (ref 98–111)
Creatinine, Ser: 1.36 mg/dL — ABNORMAL HIGH (ref 0.44–1.00)
GFR calc Af Amer: 42 mL/min — ABNORMAL LOW (ref >60)
GFR calc non Af Amer: 36 mL/min — ABNORMAL LOW (ref >60)
Glucose, Bld: 110 mg/dL — ABNORMAL HIGH (ref 70–99)
Potassium: 3.9 mmol/L (ref 3.5–5.1)
Sodium: 138 mmol/L (ref 135–145)
Total Bilirubin: 0.4 mg/dL (ref 0.3–1.2)
Total Protein: 7.1 g/dL (ref 6.5–8.1)

## 2020-02-01 MED ORDER — SODIUM CHLORIDE 0.9% FLUSH
10.0000 mL | INTRAVENOUS | Status: DC | PRN
  Administered 2020-02-01: 10 mL via INTRAVENOUS
  Filled 2020-02-01: qty 10

## 2020-02-01 MED ORDER — HEPARIN SOD (PORK) LOCK FLUSH 100 UNIT/ML IV SOLN
500.0000 [IU] | Freq: Once | INTRAVENOUS | Status: AC
  Administered 2020-02-01: 500 [IU] via INTRAVENOUS
  Filled 2020-02-01: qty 5

## 2020-02-01 MED ORDER — SODIUM CHLORIDE 0.9 % IV SOLN
1400.0000 mg | Freq: Once | INTRAVENOUS | Status: AC
  Administered 2020-02-01: 1400 mg via INTRAVENOUS
  Filled 2020-02-01: qty 26.3

## 2020-02-01 MED ORDER — HEPARIN SOD (PORK) LOCK FLUSH 100 UNIT/ML IV SOLN
INTRAVENOUS | Status: AC
  Filled 2020-02-01: qty 5

## 2020-02-01 MED ORDER — PROCHLORPERAZINE MALEATE 10 MG PO TABS
10.0000 mg | ORAL_TABLET | Freq: Once | ORAL | Status: AC
  Administered 2020-02-01: 10 mg via ORAL
  Filled 2020-02-01: qty 1

## 2020-02-01 MED ORDER — SODIUM CHLORIDE 0.9 % IV SOLN
Freq: Once | INTRAVENOUS | Status: AC
  Filled 2020-02-01: qty 250

## 2020-02-01 MED ORDER — HEPARIN SOD (PORK) LOCK FLUSH 100 UNIT/ML IV SOLN
500.0000 [IU] | Freq: Once | INTRAVENOUS | Status: DC | PRN
  Filled 2020-02-01: qty 5

## 2020-02-01 NOTE — Progress Notes (Signed)
[place] OFFICE PROGRESS NOTE  Patient Care Team: Leonel Ramsay, MD as PCP - General (Infectious Diseases) Telford Nab, RN as Registered Nurse [name], MD as Medical Oncologist (Medical Oncology)  Cancer Staging No matching staging information was found for the patient.   Oncology History Overview Note  # April 2019- RUL LUNG CANCER-non-small cell; favor squamous cell; PET scan T4N0 [right upper lobe mass involving[mediastinal/blood results] vs small pleural effusion [?M1]- CARBO-TAXOL- RT;   #October 2020-CT progressive subcentimeter right-sided lung lesions;  # NOV 17th 2020- KEYTRUDA q3 W  # 24th FEB 2021- Carbo-Taxol weekly; # 5 treatments.  March 30-2021 progressive disease liver lesion; stable lung.  #February 01, 2020-gemcitabine [800 mg/m]  # Hx of Right breast ca [s/p lumpec; RT; No chemo; "pill" x5 years]  # COPD/Hx of smoking  # PALLIATIVE CARE: 12/19/2019- DELCINED  MOLECULAR TESTING/OMNISEQ: PDL-1 25% [TPS; TMB-H; No targets**]  --------------------------------------------------    DIAGNOSIS: [ April 2019]SQUAMOUS CELL LUNG CA  STAGE: IV/Recurrent ;GOALS: PALLIATIVE  CURRENT/MOST RECENT THERAPY-  gemcitabine [C] .     Primary cancer of right upper lobe of lung ([place])  03/13/2018 - 05/15/2018 Chemotherapy   The patient had palonosetron (ALOXI) injection 0.25 mg, 0.25 mg, Intravenous,  Once, 9 of 9 cycles Administration: 0.25 mg (03/13/2018), 0.25 mg (04/10/2018), 0.25 mg (04/17/2018), 0.25 mg (03/21/2018), 0.25 mg (04/25/2018), 0.25 mg (03/27/2018), 0.25 mg (04/03/2018), 0.25 mg (05/01/2018), 0.25 mg (05/08/2018) CARBOplatin (PARAPLATIN) 130 mg in sodium chloride 0.9 % 250 mL chemo infusion, 130 mg (100 % of original dose 131.4 mg), Intravenous,  Once, 9 of 9 cycles Dose modification:   (original dose 131.4 mg, Cycle 1) Administration: 130 mg (03/13/2018), 130 mg (04/10/2018), 130 mg (04/17/2018), 130 mg (03/21/2018), 130 mg (04/25/2018), 130  mg (03/27/2018), 130 mg (04/03/2018), 130 mg (05/01/2018), 130 mg (05/08/2018) PACLitaxel (TAXOL) 78 mg in sodium chloride 0.9 % 250 mL chemo infusion (</= 23m/m2), 45 mg/m2 = 78 mg, Intravenous,  Once, 9 of 9 cycles Administration: 78 mg (03/13/2018), 78 mg (04/10/2018), 78 mg (04/17/2018), 78 mg (03/21/2018), 78 mg (04/25/2018), 78 mg (03/27/2018), 78 mg (04/03/2018), 78 mg (05/01/2018), 78 mg (05/08/2018)  for chemotherapy treatment.    09/12/2019 - 12/04/2019 Chemotherapy   The patient had pembrolizumab (KEYTRUDA) 200 mg in sodium chloride 0.9 % 50 mL chemo infusion, 200 mg, Intravenous, Once, 4 of 6 cycles Administration: 200 mg (09/12/2019), 200 mg (10/03/2019), 200 mg (10/24/2019), 200 mg (11/14/2019)  for chemotherapy treatment.    12/19/2019 - 01/24/2020 Chemotherapy   The patient had palonosetron (ALOXI) injection 0.25 mg, 0.25 mg, Intravenous,  Once, 5 of 8 cycles Administration: 0.25 mg (12/19/2019), 0.25 mg (12/28/2019), 0.25 mg (01/18/2020), 0.25 mg (01/04/2020), 0.25 mg (01/11/2020) CARBOplatin (PARAPLATIN) 120 mg in sodium chloride 0.9 % 100 mL chemo infusion, 120 mg (100 % of original dose 122 mg), Intravenous,  Once, 5 of 8 cycles Dose modification:   (original dose 122 mg, Cycle 1) Administration: 120 mg (12/19/2019), 120 mg (12/28/2019), 120 mg (01/18/2020), 120 mg (01/04/2020), 120 mg (01/11/2020) PACLitaxel (TAXOL) 138 mg in sodium chloride 0.9 % 250 mL chemo infusion (</= 841mm2), 80 mg/m2 = 138 mg, Intravenous,  Once, 5 of 8 cycles Administration: 138 mg (12/19/2019), 138 mg (12/28/2019), 138 mg (01/18/2020), 138 mg (01/04/2020), 138 mg (01/11/2020)  for chemotherapy treatment.    02/01/2020 -  Chemotherapy   The patient had gemcitabine (GEMZAR) 1,400 mg in sodium chloride 0.9 % 250 mL chemo infusion, 1,368 mg (100 % of original  dose 800 mg/m2), Intravenous,  Once, 1 of 4 cycles Dose modification: 800 mg/m2 (original dose 800 mg/m2, Cycle 1, Reason: Provider Judgment) Administration: 1,400 mg (02/01/2020)  for  chemotherapy treatment.        INTERVAL HISTORY: Patient a poor historian.  Brenda Parker 84 y.o.  female patient with recurrent/metastatic squamous cell lung cancer-most recently on on weekly carbotaxol is here for follow-up.  Patient is here to proceed with gemcitabine chemotherapy.  Patient continues to feel intermittent fatigue.  Denies any worsening shortness of breath.  No worsening cough.  Appetite is fair.  Weight stable.  Review of Systems  Constitutional: Positive for malaise/fatigue. Negative for chills, diaphoresis and fever.  HENT: Negative for nosebleeds and sore throat.   Eyes: Negative for double vision.  Respiratory: Positive for cough and shortness of breath. Negative for hemoptysis, sputum production and wheezing.   Cardiovascular: Negative for chest pain, palpitations and orthopnea.  Gastrointestinal: Negative for abdominal pain, blood in stool, heartburn, melena, nausea and vomiting.  Genitourinary: Negative for dysuria, frequency and urgency.  Musculoskeletal: Positive for joint pain. Negative for back pain.  Skin: Negative.  Negative for itching and rash.  Neurological: Negative for tingling, focal weakness, weakness and headaches.  Endo/Heme/Allergies: Does not bruise/bleed easily.  Psychiatric/Behavioral: Negative for depression. The patient is not nervous/anxious and does not have insomnia.     PAST MEDICAL HISTORY :  Past Medical History:  Diagnosis Date  . Benign liver cyst   . Breast cancer ([place]) 2005   right breast  . COPD (chronic obstructive pulmonary disease) ([place])   . Dyspnea   . Glaucoma   . Hemoptysis 2019  . Hypertension    Not on medication for htn. patient denies this  . Lung mass 01/2018  . Personal history of radiation therapy     PAST SURGICAL HISTORY :   Past Surgical History:  Procedure Laterality Date  . BREAST EXCISIONAL BIOPSY Right 2005   positive, radiation  . BREAST LUMPECTOMY Right 03/2004  . CHOLECYSTECTOMY  1988  .  COLONOSCOPY  2012  . ENDOBRONCHIAL ULTRASOUND N/A 02/21/2018   Procedure: ENDOBRONCHIAL ULTRASOUND;  Surgeon: Laverle Hobby, MD;  Location: [facility] ORS;  Service: Pulmonary;  Laterality: N/A;  . EYE SURGERY Bilateral 2009,2010   cataract extractions with lens implant  . GALLBLADDER SURGERY Bilateral   . IR FLUORO GUIDE PORT INSERTION RIGHT  03/10/2018  . JOINT REPLACEMENT Left 2008   left hip replacement  . PORTACATH PLACEMENT  03/10/2018  . TONSILLECTOMY  1960    FAMILY HISTORY :   Family History  Problem Relation Age of Onset  . Breast cancer Mother 32  . CAD Father     SOCIAL HISTORY:   Social History   Tobacco Use  . Smoking status: Former Smoker    Packs/day: 1.00    Types: Cigarettes    Quit date: 10/25/2005    Years since quitting: 14.2  . Smokeless tobacco: Never Used  Substance Use Topics  . Alcohol use: Not Currently    Comment: occasional glass of beer  . Drug use: Never    ALLERGIES:  is allergic to penicillins.  MEDICATIONS:  Current Outpatient Medications  Medication Sig Dispense Refill  . albuterol (PROVENTIL HFA) 108 (90 Base) MCG/ACT inhaler Inhale 2 puffs into the lungs every 6 (six) hours as needed for wheezing or shortness of breath.     . Cholecalciferol (VITAMIN D3) 1000 units CAPS Take 3,000 Units by mouth daily.     Marland Kitchen latanoprost (  XALATAN) 0.005 % ophthalmic solution Apply 1 drop to eye daily.    . ondansetron (ZOFRAN) 8 MG tablet One pill every 8 hours as needed for nausea/vomitting. 40 tablet 1  . oxyCODONE (OXY IR/ROXICODONE) 5 MG immediate release tablet Take 1 tablet (5 mg total) by mouth 2 (two) times daily as needed for moderate pain or severe pain. 30 tablet 0  . TRELEGY ELLIPTA 100-62.5-25 MCG/INH AEPB      No current facility-administered medications for this visit.   Facility-Administered Medications Ordered in Other Visits  Medication Dose Route Frequency Provider Last Rate Last Admin  . heparin lock flush 100 unit/mL  500 Units  Intracatheter Once PRN Charlaine Dalton R, MD      . sodium chloride flush (NS) 0.9 % injection 10 mL  10 mL Intravenous PRN [name], MD   10 mL at 02/01/20 0818    PHYSICAL EXAMINATION: ECOG PERFORMANCE STATUS: 1 - Symptomatic but completely ambulatory  BP 104/68 (BP Location: Right Arm, Patient Position: Sitting)   Pulse 85   Temp (!) 96.3 F (35.7 C) (Tympanic)   Resp 20   Ht 5' 4"  (1.626 m)   Wt 144 lb 8 oz (65.5 kg)   BMI 24.80 kg/m   Filed Weights   02/01/20 0837  Weight: 144 lb 8 oz (65.5 kg)    Physical Exam  Constitutional: She is oriented to person, place, and time.  Frail-appearing Caucasian female patient..  She is walking herself.  HENT:  Head: Normocephalic and atraumatic.  Mouth/Throat: Oropharynx is clear and moist. No oropharyngeal exudate.  Eyes: Pupils are equal, round, and reactive to light.  Cardiovascular: Normal rate and regular rhythm.  Pulmonary/Chest: No respiratory distress. She has no wheezes.  Decreased breath sounds in the right compared to the left.  Abdominal: Soft. Bowel sounds are normal. She exhibits no distension and no mass. There is no abdominal tenderness. There is no rebound and no guarding.  Musculoskeletal:        General: No tenderness or edema. Normal range of motion.     Cervical back: Normal range of motion and neck supple.  Neurological: She is alert and oriented to person, place, and time.  Skin: Skin is warm.  Psychiatric: Affect normal.    LABORATORY DATA:  I have reviewed the data as listed    Component Value Date/Time   NA 138 02/01/2020 0820   K 3.9 02/01/2020 0820   CL 105 02/01/2020 0820   CO2 23 02/01/2020 0820   GLUCOSE 110 (H) 02/01/2020 0820   BUN 20 02/01/2020 0820   CREATININE 1.36 (H) 02/01/2020 0820   CALCIUM 9.0 02/01/2020 0820   PROT 7.1 02/01/2020 0820   ALBUMIN 3.2 (L) 02/01/2020 0820   AST 15 02/01/2020 0820   ALT 10 02/01/2020 0820   ALKPHOS 68 02/01/2020 0820   BILITOT 0.4  02/01/2020 0820   GFRNONAA 36 (L) 02/01/2020 0820   GFRAA 42 (L) 02/01/2020 0820    No results found for: SPEP, UPEP  Lab Results  Component Value Date   WBC 5.4 02/01/2020   NEUTROABS 3.5 02/01/2020   HGB 10.4 (L) 02/01/2020   HCT 33.7 (L) 02/01/2020   MCV 89.4 02/01/2020   PLT 235 02/01/2020      Chemistry      Component Value Date/Time   NA 138 02/01/2020 0820   K 3.9 02/01/2020 0820   CL 105 02/01/2020 0820   CO2 23 02/01/2020 0820   BUN 20 02/01/2020 0820  CREATININE 1.36 (H) 02/01/2020 0820      Component Value Date/Time   CALCIUM 9.0 02/01/2020 0820   ALKPHOS 68 02/01/2020 0820   AST 15 02/01/2020 0820   ALT 10 02/01/2020 0820   BILITOT 0.4 02/01/2020 0820       RADIOGRAPHIC STUDIES: I have personally reviewed the radiological images as listed and agreed with the findings in the report. No results found.   ASSESSMENT & PLAN:  Primary cancer of right upper lobe of lung ([place]) # Right upper lobe lung cancer-progressive/metastatic-stage IV; CT scan March 30-stable lung nodules/mild increased; however progressive liver lesion ~4 cm. Proceed with gemcitabine today.  # proceed with gemcitabine cycle #1 day-1 today. Labs today reviewed;  acceptable for treatment today.   #Right chest wall pain-likely secondary to pleurisy/scar tissue chronic- STABLE.   #COPD STABLE; continue current medication/inhalers.   # DISPOSITION: # proceed with chemo today # 1 week- MD- labs- Cbc/Cmp; Gemcitabine-Dr.B.     No orders of the defined types were placed in this encounter.  All questions were answered. The patient knows to call the clinic with any problems, questions or concerns.      [name], MD 02/01/2020 12:23 PM

## 2020-02-01 NOTE — Progress Notes (Signed)
No blood return noted from port, flushes without difficulty and no pain, redness or swelling noted. Dr. Rogue Bussing aware, and per Dr. Rogue Bussing okay to use port for treatment.

## 2020-02-01 NOTE — Assessment & Plan Note (Addendum)
#   Right upper lobe lung cancer-progressive/metastatic-stage IV; CT scan March 30-stable lung nodules/mild increased; however progressive liver lesion ~4 cm. Proceed with gemcitabine today.  # proceed with gemcitabine cycle #1 day-1 today. Labs today reviewed;  acceptable for treatment today.   #Right chest wall pain-likely secondary to pleurisy/scar tissue chronic- STABLE.   #COPD STABLE; continue current medication/inhalers.   # DISPOSITION: # proceed with chemo today # 1 week- MD- labs- Cbc/Cmp; Gemcitabine-Dr.B.

## 2020-02-01 NOTE — Progress Notes (Signed)
Patient here for new chemotherapy- gemzar. Patient has no medical complaints today. She inquired if she could have a RF on her oxycodone. Reviewed chart. Patient had refill sent to pharmacy on 01/18/2020. Patient not aware that script was sent to pharmacy. She states that she doesn't recall picking up this prescription. She will check with her pharmacy regarding the prescription.  RN Hand off provided to Baxter Flattery, RN - chemotherapy.

## 2020-02-08 ENCOUNTER — Inpatient Hospital Stay: Payer: Medicare Other

## 2020-02-08 ENCOUNTER — Other Ambulatory Visit: Payer: Self-pay

## 2020-02-08 ENCOUNTER — Encounter: Payer: Self-pay | Admitting: Internal Medicine

## 2020-02-08 ENCOUNTER — Inpatient Hospital Stay (HOSPITAL_BASED_OUTPATIENT_CLINIC_OR_DEPARTMENT_OTHER): Payer: Medicare Other | Admitting: Internal Medicine

## 2020-02-08 DIAGNOSIS — Z5111 Encounter for antineoplastic chemotherapy: Secondary | ICD-10-CM | POA: Diagnosis not present

## 2020-02-08 DIAGNOSIS — C3411 Malignant neoplasm of upper lobe, right bronchus or lung: Secondary | ICD-10-CM

## 2020-02-08 DIAGNOSIS — Z7189 Other specified counseling: Secondary | ICD-10-CM

## 2020-02-08 LAB — CBC WITH DIFFERENTIAL/PLATELET
Abs Immature Granulocytes: 0.01 10*3/uL (ref 0.00–0.07)
Basophils Absolute: 0 10*3/uL (ref 0.0–0.1)
Basophils Relative: 0 %
Eosinophils Absolute: 0 10*3/uL (ref 0.0–0.5)
Eosinophils Relative: 1 %
HCT: 32.2 % — ABNORMAL LOW (ref 36.0–46.0)
Hemoglobin: 10.4 g/dL — ABNORMAL LOW (ref 12.0–15.0)
Immature Granulocytes: 0 %
Lymphocytes Relative: 22 %
Lymphs Abs: 0.8 10*3/uL (ref 0.7–4.0)
MCH: 28.7 pg (ref 26.0–34.0)
MCHC: 32.3 g/dL (ref 30.0–36.0)
MCV: 88.7 fL (ref 80.0–100.0)
Monocytes Absolute: 0.4 10*3/uL (ref 0.1–1.0)
Monocytes Relative: 12 %
Neutro Abs: 2.3 10*3/uL (ref 1.7–7.7)
Neutrophils Relative %: 65 %
Platelets: 143 10*3/uL — ABNORMAL LOW (ref 150–400)
RBC: 3.63 MIL/uL — ABNORMAL LOW (ref 3.87–5.11)
RDW: 18.1 % — ABNORMAL HIGH (ref 11.5–15.5)
WBC: 3.5 10*3/uL — ABNORMAL LOW (ref 4.0–10.5)
nRBC: 0 % (ref 0.0–0.2)

## 2020-02-08 LAB — COMPREHENSIVE METABOLIC PANEL
ALT: 22 U/L (ref 0–44)
AST: 21 U/L (ref 15–41)
Albumin: 3.3 g/dL — ABNORMAL LOW (ref 3.5–5.0)
Alkaline Phosphatase: 74 U/L (ref 38–126)
Anion gap: 9 (ref 5–15)
BUN: 20 mg/dL (ref 8–23)
CO2: 24 mmol/L (ref 22–32)
Calcium: 9.2 mg/dL (ref 8.9–10.3)
Chloride: 106 mmol/L (ref 98–111)
Creatinine, Ser: 1.21 mg/dL — ABNORMAL HIGH (ref 0.44–1.00)
GFR calc Af Amer: 49 mL/min — ABNORMAL LOW (ref >60)
GFR calc non Af Amer: 42 mL/min — ABNORMAL LOW (ref >60)
Glucose, Bld: 100 mg/dL — ABNORMAL HIGH (ref 70–99)
Potassium: 3.9 mmol/L (ref 3.5–5.1)
Sodium: 139 mmol/L (ref 135–145)
Total Bilirubin: 0.5 mg/dL (ref 0.3–1.2)
Total Protein: 7.6 g/dL (ref 6.5–8.1)

## 2020-02-08 MED ORDER — SODIUM CHLORIDE 0.9 % IV SOLN
Freq: Once | INTRAVENOUS | Status: AC
  Filled 2020-02-08: qty 250

## 2020-02-08 MED ORDER — PROCHLORPERAZINE MALEATE 10 MG PO TABS
10.0000 mg | ORAL_TABLET | Freq: Once | ORAL | Status: AC
  Administered 2020-02-08: 10 mg via ORAL
  Filled 2020-02-08: qty 1

## 2020-02-08 MED ORDER — SODIUM CHLORIDE 0.9 % IV SOLN
1400.0000 mg | Freq: Once | INTRAVENOUS | Status: AC
  Administered 2020-02-08: 10:00:00 1400 mg via INTRAVENOUS
  Filled 2020-02-08: qty 26.3

## 2020-02-08 MED ORDER — HEPARIN SOD (PORK) LOCK FLUSH 100 UNIT/ML IV SOLN
500.0000 [IU] | Freq: Once | INTRAVENOUS | Status: AC | PRN
  Administered 2020-02-08: 500 [IU]
  Filled 2020-02-08: qty 5

## 2020-02-08 MED ORDER — HEPARIN SOD (PORK) LOCK FLUSH 100 UNIT/ML IV SOLN
INTRAVENOUS | Status: AC
  Filled 2020-02-08: qty 5

## 2020-02-08 NOTE — Assessment & Plan Note (Addendum)
#   Right upper lobe lung cancer-progressive/metastatic-stage IV; CT scan March 30-stable lung nodules/mild increased; however progressive liver lesion ~4 cm. Proceed with gemcitabine today.  # proceed with gemcitabine cycle #1 day-8 today. Labs today reviewed;  acceptable for treatment today.   #Right chest wall pain-likely secondary to pleurisy/scar tissue chronic- stable.    #COPD STABLE; continue current medication/inhalers.   # Weight loss- discussed with patient; declines dietary referral.    # DISPOSITION: # proceed with chemo today # 2 week- MD- labs- Cbc/Cmp; Gemcitabine-Dr.B.

## 2020-02-08 NOTE — Progress Notes (Signed)
[facility] OFFICE PROGRESS NOTE  Patient Care Team: Leonel Ramsay, MD as PCP - General (Infectious Diseases) Telford Nab, RN as Registered Nurse [name], MD as Medical Oncologist (Medical Oncology)  Cancer Staging No matching staging information was found for the patient.   Oncology History Overview Note  # April 2019- RUL LUNG CANCER-non-small cell; favor squamous cell; PET scan T4N0 [right upper lobe mass involving[mediastinal/blood results] vs small pleural effusion [?M1]- CARBO-TAXOL- RT;   #October 2020-CT progressive subcentimeter right-sided lung lesions;  # NOV 17th 2020- KEYTRUDA q3 W  # 24th FEB 2021- Carbo-Taxol weekly; # 5 treatments.  March 30-2021 progressive disease liver lesion; stable lung.  #February 01, 2020-gemcitabine [800 mg/m]  # Hx of Right breast ca [s/p lumpec; RT; No chemo; "pill" x5 years]  # COPD/Hx of smoking  # PALLIATIVE CARE: 12/19/2019- DELCINED  MOLECULAR TESTING/OMNISEQ: PDL-1 25% [TPS; TMB-H; No targets**]  --------------------------------------------------    DIAGNOSIS: [ April 2019]SQUAMOUS CELL LUNG CA  STAGE: IV/Recurrent ;GOALS: PALLIATIVE  CURRENT/MOST RECENT THERAPY-  gemcitabine [C] .     Primary cancer of right upper lobe of lung ([place])  03/13/2018 - 05/15/2018 Chemotherapy   The patient had palonosetron (ALOXI) injection 0.25 mg, 0.25 mg, Intravenous,  Once, 9 of 9 cycles Administration: 0.25 mg (03/13/2018), 0.25 mg (04/10/2018), 0.25 mg (04/17/2018), 0.25 mg (03/21/2018), 0.25 mg (04/25/2018), 0.25 mg (03/27/2018), 0.25 mg (04/03/2018), 0.25 mg (05/01/2018), 0.25 mg (05/08/2018) CARBOplatin (PARAPLATIN) 130 mg in sodium chloride 0.9 % 250 mL chemo infusion, 130 mg (100 % of original dose 131.4 mg), Intravenous,  Once, 9 of 9 cycles Dose modification:   (original dose 131.4 mg, Cycle 1) Administration: 130 mg (03/13/2018), 130 mg (04/10/2018), 130 mg (04/17/2018), 130 mg (03/21/2018), 130 mg (04/25/2018), 130  mg (03/27/2018), 130 mg (04/03/2018), 130 mg (05/01/2018), 130 mg (05/08/2018) PACLitaxel (TAXOL) 78 mg in sodium chloride 0.9 % 250 mL chemo infusion (</= 23m/m2), 45 mg/m2 = 78 mg, Intravenous,  Once, 9 of 9 cycles Administration: 78 mg (03/13/2018), 78 mg (04/10/2018), 78 mg (04/17/2018), 78 mg (03/21/2018), 78 mg (04/25/2018), 78 mg (03/27/2018), 78 mg (04/03/2018), 78 mg (05/01/2018), 78 mg (05/08/2018)  for chemotherapy treatment.    09/12/2019 - 12/04/2019 Chemotherapy   The patient had pembrolizumab (KEYTRUDA) 200 mg in sodium chloride 0.9 % 50 mL chemo infusion, 200 mg, Intravenous, Once, 4 of 6 cycles Administration: 200 mg (09/12/2019), 200 mg (10/03/2019), 200 mg (10/24/2019), 200 mg (11/14/2019)  for chemotherapy treatment.    12/19/2019 - 01/24/2020 Chemotherapy   The patient had palonosetron (ALOXI) injection 0.25 mg, 0.25 mg, Intravenous,  Once, 5 of 8 cycles Administration: 0.25 mg (12/19/2019), 0.25 mg (12/28/2019), 0.25 mg (01/18/2020), 0.25 mg (01/04/2020), 0.25 mg (01/11/2020) CARBOplatin (PARAPLATIN) 120 mg in sodium chloride 0.9 % 100 mL chemo infusion, 120 mg (100 % of original dose 122 mg), Intravenous,  Once, 5 of 8 cycles Dose modification:   (original dose 122 mg, Cycle 1) Administration: 120 mg (12/19/2019), 120 mg (12/28/2019), 120 mg (01/18/2020), 120 mg (01/04/2020), 120 mg (01/11/2020) PACLitaxel (TAXOL) 138 mg in sodium chloride 0.9 % 250 mL chemo infusion (</= 841mm2), 80 mg/m2 = 138 mg, Intravenous,  Once, 5 of 8 cycles Administration: 138 mg (12/19/2019), 138 mg (12/28/2019), 138 mg (01/18/2020), 138 mg (01/04/2020), 138 mg (01/11/2020)  for chemotherapy treatment.    02/01/2020 -  Chemotherapy   The patient had gemcitabine (GEMZAR) 1,400 mg in sodium chloride 0.9 % 250 mL chemo infusion, 1,368 mg (100 % of original  dose 800 mg/m2), Intravenous,  Once, 1 of 4 cycles Dose modification: 800 mg/m2 (original dose 800 mg/m2, Cycle 1, Reason: Provider Judgment) Administration: 1,400 mg (02/01/2020)  for  chemotherapy treatment.        INTERVAL HISTORY: Patient a poor historian.  [name] 84 y.o.  female patient with recurrent/metastatic squamous cell lung cancer-currently on  gemcitabine chemotherapy is here for follow-up.  Patient tolerated cycle number 1 day 1 a week ago fairly well.  Positive weight loss.  Patient states her appetite is fair.  However because she lives alone/difficulty in fixing the food.  No diarrhea.  No nausea no vomiting.  Chronic shortness of breath chronic cough.  Not any worse. .  Review of Systems  Constitutional: Positive for malaise/fatigue. Negative for chills, diaphoresis and fever.  HENT: Negative for nosebleeds and sore throat.   Eyes: Negative for double vision.  Respiratory: Positive for cough and shortness of breath. Negative for hemoptysis, sputum production and wheezing.   Cardiovascular: Negative for chest pain, palpitations and orthopnea.  Gastrointestinal: Negative for abdominal pain, blood in stool, heartburn, melena, nausea and vomiting.  Genitourinary: Negative for dysuria, frequency and urgency.  Musculoskeletal: Positive for back pain and joint pain.  Skin: Negative.  Negative for itching and rash.  Neurological: Negative for tingling, focal weakness, weakness and headaches.  Endo/Heme/Allergies: Does not bruise/bleed easily.  Psychiatric/Behavioral: Negative for depression. The patient is not nervous/anxious and does not have insomnia.     PAST MEDICAL HISTORY :  Past Medical History:  Diagnosis Date  . Benign liver cyst   . Breast cancer ([place]) 2005   right breast  . COPD (chronic obstructive pulmonary disease) ([place])   . Dyspnea   . Glaucoma   . Hemoptysis 2019  . Hypertension    Not on medication for htn. patient denies this  . Lung mass 01/2018  . Personal history of radiation therapy     PAST SURGICAL HISTORY :   Past Surgical History:  Procedure Laterality Date  . BREAST EXCISIONAL BIOPSY Right 2005   positive,  radiation  . BREAST LUMPECTOMY Right 03/2004  . CHOLECYSTECTOMY  1988  . COLONOSCOPY  2012  . ENDOBRONCHIAL ULTRASOUND N/A 02/21/2018   Procedure: ENDOBRONCHIAL ULTRASOUND;  Surgeon: Laverle Hobby, MD;  Location: [facility] ORS;  Service: Pulmonary;  Laterality: N/A;  . EYE SURGERY Bilateral 2009,2010   cataract extractions with lens implant  . GALLBLADDER SURGERY Bilateral   . IR FLUORO GUIDE PORT INSERTION RIGHT  03/10/2018  . JOINT REPLACEMENT Left 2008   left hip replacement  . PORTACATH PLACEMENT  03/10/2018  . TONSILLECTOMY  1960    FAMILY HISTORY :   Family History  Problem Relation Age of Onset  . Breast cancer Mother 36  . CAD Father     SOCIAL HISTORY:   Social History   Tobacco Use  . Smoking status: Former Smoker    Packs/day: 1.00    Types: Cigarettes    Quit date: 10/25/2005    Years since quitting: 14.2  . Smokeless tobacco: Never Used  Substance Use Topics  . Alcohol use: Not Currently    Comment: occasional glass of beer  . Drug use: Never    ALLERGIES:  is allergic to penicillins.  MEDICATIONS:  Current Outpatient Medications  Medication Sig Dispense Refill  . albuterol (PROVENTIL HFA) 108 (90 Base) MCG/ACT inhaler Inhale 2 puffs into the lungs every 6 (six) hours as needed for wheezing or shortness of breath.     Marland Kitchen  Cholecalciferol (VITAMIN D3) 1000 units CAPS Take 3,000 Units by mouth daily.     Marland Kitchen latanoprost (XALATAN) 0.005 % ophthalmic solution Apply 1 drop to eye daily.    . ondansetron (ZOFRAN) 8 MG tablet One pill every 8 hours as needed for nausea/vomitting. 40 tablet 1  . oxyCODONE (OXY IR/ROXICODONE) 5 MG immediate release tablet Take 1 tablet (5 mg total) by mouth 2 (two) times daily as needed for moderate pain or severe pain. 30 tablet 0  . TRELEGY ELLIPTA 100-62.5-25 MCG/INH AEPB      No current facility-administered medications for this visit.    PHYSICAL EXAMINATION: ECOG PERFORMANCE STATUS: 1 - Symptomatic but completely  ambulatory  BP 118/67 (BP Location: Right Arm, Patient Position: Sitting, Cuff Size: Normal)   Pulse 94   Temp (!) 95.6 F (35.3 C) (Tympanic)   Wt 141 lb (64 kg)   BMI 24.20 kg/m   Filed Weights   02/08/20 0900  Weight: 141 lb (64 kg)    Physical Exam  Constitutional: She is oriented to person, place, and time.  Frail-appearing Caucasian female patient..  She is walking herself.  HENT:  Head: Normocephalic and atraumatic.  Mouth/Throat: Oropharynx is clear and moist. No oropharyngeal exudate.  Eyes: Pupils are equal, round, and reactive to light.  Cardiovascular: Normal rate and regular rhythm.  Pulmonary/Chest: No respiratory distress. She has no wheezes.  Decreased breath sounds in the right compared to the left.  Abdominal: Soft. Bowel sounds are normal. She exhibits no distension and no mass. There is no abdominal tenderness. There is no rebound and no guarding.  Musculoskeletal:        General: No tenderness or edema. Normal range of motion.     Cervical back: Normal range of motion and neck supple.  Neurological: She is alert and oriented to person, place, and time.  Skin: Skin is warm.  Psychiatric: Affect normal.    LABORATORY DATA:  I have reviewed the data as listed    Component Value Date/Time   NA 139 02/08/2020 0847   K 3.9 02/08/2020 0847   CL 106 02/08/2020 0847   CO2 24 02/08/2020 0847   GLUCOSE 100 (H) 02/08/2020 0847   BUN 20 02/08/2020 0847   CREATININE 1.21 (H) 02/08/2020 0847   CALCIUM 9.2 02/08/2020 0847   PROT 7.6 02/08/2020 0847   ALBUMIN 3.3 (L) 02/08/2020 0847   AST 21 02/08/2020 0847   ALT 22 02/08/2020 0847   ALKPHOS 74 02/08/2020 0847   BILITOT 0.5 02/08/2020 0847   GFRNONAA 42 (L) 02/08/2020 0847   GFRAA 49 (L) 02/08/2020 0847    No results found for: SPEP, UPEP  Lab Results  Component Value Date   WBC 3.5 (L) 02/08/2020   NEUTROABS 2.3 02/08/2020   HGB 10.4 (L) 02/08/2020   HCT 32.2 (L) 02/08/2020   MCV 88.7 02/08/2020    PLT 143 (L) 02/08/2020      Chemistry      Component Value Date/Time   NA 139 02/08/2020 0847   K 3.9 02/08/2020 0847   CL 106 02/08/2020 0847   CO2 24 02/08/2020 0847   BUN 20 02/08/2020 0847   CREATININE 1.21 (H) 02/08/2020 0847      Component Value Date/Time   CALCIUM 9.2 02/08/2020 0847   ALKPHOS 74 02/08/2020 0847   AST 21 02/08/2020 0847   ALT 22 02/08/2020 0847   BILITOT 0.5 02/08/2020 0847       RADIOGRAPHIC STUDIES: I have personally reviewed the radiological  images as listed and agreed with the findings in the report. No results found.   ASSESSMENT & PLAN:  Primary cancer of right upper lobe of lung ([place]) # Right upper lobe lung cancer-progressive/metastatic-stage IV; CT scan March 30-stable lung nodules/mild increased; however progressive liver lesion ~4 cm. Proceed with gemcitabine today.  # proceed with gemcitabine cycle #1 day-8 today. Labs today reviewed;  acceptable for treatment today.   #Right chest wall pain-likely secondary to pleurisy/scar tissue chronic- stable.    #COPD STABLE; continue current medication/inhalers.   # Weight loss- discussed with patient; declines dietary referral.    # DISPOSITION: # proceed with chemo today # 2 week- MD- labs- Cbc/Cmp; Gemcitabine-Dr.B.     No orders of the defined types were placed in this encounter.  All questions were answered. The patient knows to call the clinic with any problems, questions or concerns.      [name], MD 02/08/2020 9:32 AM

## 2020-02-11 ENCOUNTER — Telehealth: Payer: Self-pay | Admitting: Internal Medicine

## 2020-02-11 NOTE — Telephone Encounter (Signed)
On 4/16-spoke to patient's son [name] regarding patient's weight loss.  Discussed regarding nutrition supplementation.

## 2020-02-15 NOTE — Progress Notes (Signed)

## 2020-02-22 ENCOUNTER — Encounter: Payer: Self-pay | Admitting: Internal Medicine

## 2020-02-22 ENCOUNTER — Inpatient Hospital Stay (HOSPITAL_BASED_OUTPATIENT_CLINIC_OR_DEPARTMENT_OTHER): Payer: Medicare Other | Admitting: Internal Medicine

## 2020-02-22 ENCOUNTER — Inpatient Hospital Stay: Payer: Medicare Other

## 2020-02-22 ENCOUNTER — Other Ambulatory Visit: Payer: Self-pay

## 2020-02-22 DIAGNOSIS — Z5111 Encounter for antineoplastic chemotherapy: Secondary | ICD-10-CM | POA: Diagnosis not present

## 2020-02-22 DIAGNOSIS — C3411 Malignant neoplasm of upper lobe, right bronchus or lung: Secondary | ICD-10-CM | POA: Diagnosis not present

## 2020-02-22 DIAGNOSIS — Z7189 Other specified counseling: Secondary | ICD-10-CM

## 2020-02-22 LAB — CBC WITH DIFFERENTIAL/PLATELET
Abs Immature Granulocytes: 0.04 10*3/uL (ref 0.00–0.07)
Basophils Absolute: 0 10*3/uL (ref 0.0–0.1)
Basophils Relative: 0 %
Eosinophils Absolute: 0.1 10*3/uL (ref 0.0–0.5)
Eosinophils Relative: 3 %
HCT: 32.2 % — ABNORMAL LOW (ref 36.0–46.0)
Hemoglobin: 10.2 g/dL — ABNORMAL LOW (ref 12.0–15.0)
Immature Granulocytes: 1 %
Lymphocytes Relative: 14 %
Lymphs Abs: 0.8 10*3/uL (ref 0.7–4.0)
MCH: 29.2 pg (ref 26.0–34.0)
MCHC: 31.7 g/dL (ref 30.0–36.0)
MCV: 92.3 fL (ref 80.0–100.0)
Monocytes Absolute: 0.6 10*3/uL (ref 0.1–1.0)
Monocytes Relative: 11 %
Neutro Abs: 4 10*3/uL (ref 1.7–7.7)
Neutrophils Relative %: 71 %
Platelets: 415 10*3/uL — ABNORMAL HIGH (ref 150–400)
RBC: 3.49 MIL/uL — ABNORMAL LOW (ref 3.87–5.11)
RDW: 20.7 % — ABNORMAL HIGH (ref 11.5–15.5)
WBC: 5.5 10*3/uL (ref 4.0–10.5)
nRBC: 0 % (ref 0.0–0.2)

## 2020-02-22 LAB — COMPREHENSIVE METABOLIC PANEL
ALT: 18 U/L (ref 0–44)
AST: 19 U/L (ref 15–41)
Albumin: 3.5 g/dL (ref 3.5–5.0)
Alkaline Phosphatase: 78 U/L (ref 38–126)
Anion gap: 10 (ref 5–15)
BUN: 20 mg/dL (ref 8–23)
CO2: 24 mmol/L (ref 22–32)
Calcium: 8.9 mg/dL (ref 8.9–10.3)
Chloride: 106 mmol/L (ref 98–111)
Creatinine, Ser: 1.28 mg/dL — ABNORMAL HIGH (ref 0.44–1.00)
GFR calc Af Amer: 45 mL/min — ABNORMAL LOW (ref >60)
GFR calc non Af Amer: 39 mL/min — ABNORMAL LOW (ref >60)
Glucose, Bld: 101 mg/dL — ABNORMAL HIGH (ref 70–99)
Potassium: 3.8 mmol/L (ref 3.5–5.1)
Sodium: 140 mmol/L (ref 135–145)
Total Bilirubin: 0.4 mg/dL (ref 0.3–1.2)
Total Protein: 7.6 g/dL (ref 6.5–8.1)

## 2020-02-22 MED ORDER — SODIUM CHLORIDE 0.9 % IV SOLN
1400.0000 mg | Freq: Once | INTRAVENOUS | Status: AC
  Administered 2020-02-22: 1400 mg via INTRAVENOUS
  Filled 2020-02-22: qty 26.3

## 2020-02-22 MED ORDER — HEPARIN SOD (PORK) LOCK FLUSH 100 UNIT/ML IV SOLN
500.0000 [IU] | Freq: Once | INTRAVENOUS | Status: AC | PRN
  Administered 2020-02-22: 500 [IU]
  Filled 2020-02-22: qty 5

## 2020-02-22 MED ORDER — PROCHLORPERAZINE MALEATE 10 MG PO TABS
10.0000 mg | ORAL_TABLET | Freq: Once | ORAL | Status: AC
  Administered 2020-02-22: 10 mg via ORAL
  Filled 2020-02-22: qty 1

## 2020-02-22 MED ORDER — SODIUM CHLORIDE 0.9 % IV SOLN
Freq: Once | INTRAVENOUS | Status: AC
  Filled 2020-02-22: qty 250

## 2020-02-22 MED ORDER — HEPARIN SOD (PORK) LOCK FLUSH 100 UNIT/ML IV SOLN
INTRAVENOUS | Status: AC
  Filled 2020-02-22: qty 5

## 2020-02-22 NOTE — Assessment & Plan Note (Addendum)
#   Right upper lobe lung cancer-progressive/metastatic-stage IV; CT scan March 30-stable lung nodules/mild increased; however progressive liver lesion ~4 cm.  Currently on single agent gemcitabine.  # proceed with gemcitabine cycle #2 day-1 today. Labs today reviewed;  acceptable for treatment today.  We plan imaging after 3 cycles.  #Right chest wall pain-likely secondary to pleurisy/scar tissue chronic-stable.  #COPD stable continue current medication/inhalers.   # Weight loss-stable.  # DISPOSITION: # proceed with chemo today # 1 week-labs- cbc/bmp;GEM # 3 week- MD- labs- Cbc/Cmp; Gemcitabine-Dr.B.

## 2020-02-22 NOTE — Progress Notes (Signed)
[place] OFFICE PROGRESS NOTE  Patient Care Team: Leonel Ramsay, MD as PCP - General (Infectious Diseases) Telford Nab, RN as Registered Nurse [name], MD as Medical Oncologist (Medical Oncology)  Cancer Staging No matching staging information was found for the patient.   Oncology History Overview Note  # April 2019- RUL LUNG CANCER-non-small cell; favor squamous cell; PET scan T4N0 [right upper lobe mass involving[mediastinal/blood results] vs small pleural effusion [?M1]- CARBO-TAXOL- RT;   #October 2020-CT progressive subcentimeter right-sided lung lesions;  # NOV 17th 2020- KEYTRUDA q3 W  # 24th FEB 2021- Carbo-Taxol weekly; # 5 treatments.  March 30-2021 progressive disease liver lesion; stable lung.  #February 01, 2020-gemcitabine [800 mg/m]  # Hx of Right breast ca [s/p lumpec; RT; No chemo; "pill" x5 years]  # COPD/Hx of smoking  # PALLIATIVE CARE: 12/19/2019- DELCINED  MOLECULAR TESTING/OMNISEQ: PDL-1 25% [TPS; TMB-H; No targets**]  --------------------------------------------------    DIAGNOSIS: [ April 2019]SQUAMOUS CELL LUNG CA  STAGE: IV/Recurrent ;GOALS: PALLIATIVE  CURRENT/MOST RECENT THERAPY-  gemcitabine [C] .     Primary cancer of right upper lobe of lung ([place])  03/13/2018 - 05/15/2018 Chemotherapy   The patient had palonosetron (ALOXI) injection 0.25 mg, 0.25 mg, Intravenous,  Once, 9 of 9 cycles Administration: 0.25 mg (03/13/2018), 0.25 mg (04/10/2018), 0.25 mg (04/17/2018), 0.25 mg (03/21/2018), 0.25 mg (04/25/2018), 0.25 mg (03/27/2018), 0.25 mg (04/03/2018), 0.25 mg (05/01/2018), 0.25 mg (05/08/2018) CARBOplatin (PARAPLATIN) 130 mg in sodium chloride 0.9 % 250 mL chemo infusion, 130 mg (100 % of original dose 131.4 mg), Intravenous,  Once, 9 of 9 cycles Dose modification:   (original dose 131.4 mg, Cycle 1) Administration: 130 mg (03/13/2018), 130 mg (04/10/2018), 130 mg (04/17/2018), 130 mg (03/21/2018), 130 mg (04/25/2018), 130  mg (03/27/2018), 130 mg (04/03/2018), 130 mg (05/01/2018), 130 mg (05/08/2018) PACLitaxel (TAXOL) 78 mg in sodium chloride 0.9 % 250 mL chemo infusion (</= 65m/m2), 45 mg/m2 = 78 mg, Intravenous,  Once, 9 of 9 cycles Administration: 78 mg (03/13/2018), 78 mg (04/10/2018), 78 mg (04/17/2018), 78 mg (03/21/2018), 78 mg (04/25/2018), 78 mg (03/27/2018), 78 mg (04/03/2018), 78 mg (05/01/2018), 78 mg (05/08/2018)  for chemotherapy treatment.    09/12/2019 - 12/04/2019 Chemotherapy   The patient had pembrolizumab (KEYTRUDA) 200 mg in sodium chloride 0.9 % 50 mL chemo infusion, 200 mg, Intravenous, Once, 4 of 6 cycles Administration: 200 mg (09/12/2019), 200 mg (10/03/2019), 200 mg (10/24/2019), 200 mg (11/14/2019)  for chemotherapy treatment.    12/19/2019 - 01/24/2020 Chemotherapy   The patient had palonosetron (ALOXI) injection 0.25 mg, 0.25 mg, Intravenous,  Once, 5 of 8 cycles Administration: 0.25 mg (12/19/2019), 0.25 mg (12/28/2019), 0.25 mg (01/18/2020), 0.25 mg (01/04/2020), 0.25 mg (01/11/2020) CARBOplatin (PARAPLATIN) 120 mg in sodium chloride 0.9 % 100 mL chemo infusion, 120 mg (100 % of original dose 122 mg), Intravenous,  Once, 5 of 8 cycles Dose modification:   (original dose 122 mg, Cycle 1) Administration: 120 mg (12/19/2019), 120 mg (12/28/2019), 120 mg (01/18/2020), 120 mg (01/04/2020), 120 mg (01/11/2020) PACLitaxel (TAXOL) 138 mg in sodium chloride 0.9 % 250 mL chemo infusion (</= 862mm2), 80 mg/m2 = 138 mg, Intravenous,  Once, 5 of 8 cycles Administration: 138 mg (12/19/2019), 138 mg (12/28/2019), 138 mg (01/18/2020), 138 mg (01/04/2020), 138 mg (01/11/2020)  for chemotherapy treatment.    02/01/2020 -  Chemotherapy   The patient had gemcitabine (GEMZAR) 1,400 mg in sodium chloride 0.9 % 250 mL chemo infusion, 1,368 mg (100 % of original  dose 800 mg/m2), Intravenous,  Once, 2 of 4 cycles Dose modification: 800 mg/m2 (original dose 800 mg/m2, Cycle 1, Reason: Provider Judgment) Administration: 1,400 mg (02/01/2020), 1,400  mg (02/08/2020), 1,400 mg (02/22/2020)  for chemotherapy treatment.        INTERVAL HISTORY: Patient a poor historian.  Brenda Parker 84 y.o.  female patient with recurrent/metastatic squamous cell lung cancer-currently on  gemcitabine chemotherapy is here for follow-up.  Patient is here to proceed with cycle number 2-day 1 chemotherapy.   States appetite is fair.  However not losing any further weight.  No nausea no vomiting.  Mild to moderate fatigue.  Chronic cough.  Review of Systems  Constitutional: Positive for malaise/fatigue. Negative for chills, diaphoresis and fever.  HENT: Negative for nosebleeds and sore throat.   Eyes: Negative for double vision.  Respiratory: Positive for cough and shortness of breath. Negative for hemoptysis, sputum production and wheezing.   Cardiovascular: Negative for chest pain, palpitations and orthopnea.  Gastrointestinal: Negative for abdominal pain, blood in stool, heartburn, melena, nausea and vomiting.  Genitourinary: Negative for dysuria, frequency and urgency.  Musculoskeletal: Positive for back pain and joint pain.  Skin: Negative.  Negative for itching and rash.  Neurological: Negative for tingling, focal weakness, weakness and headaches.  Endo/Heme/Allergies: Does not bruise/bleed easily.  Psychiatric/Behavioral: Negative for depression. The patient is not nervous/anxious and does not have insomnia.     PAST MEDICAL HISTORY :  Past Medical History:  Diagnosis Date  . Benign liver cyst   . Breast cancer ([place]) 2005   right breast  . COPD (chronic obstructive pulmonary disease) ([place])   . Dyspnea   . Glaucoma   . Hemoptysis 2019  . Hypertension    Not on medication for htn. patient denies this  . Lung mass 01/2018  . Personal history of radiation therapy     PAST SURGICAL HISTORY :   Past Surgical History:  Procedure Laterality Date  . BREAST EXCISIONAL BIOPSY Right 2005   positive, radiation  . BREAST LUMPECTOMY Right  03/2004  . CHOLECYSTECTOMY  1988  . COLONOSCOPY  2012  . ENDOBRONCHIAL ULTRASOUND N/A 02/21/2018   Procedure: ENDOBRONCHIAL ULTRASOUND;  Surgeon: Laverle Hobby, MD;  Location: [facility] ORS;  Service: Pulmonary;  Laterality: N/A;  . EYE SURGERY Bilateral 2009,2010   cataract extractions with lens implant  . GALLBLADDER SURGERY Bilateral   . IR FLUORO GUIDE PORT INSERTION RIGHT  03/10/2018  . JOINT REPLACEMENT Left 2008   left hip replacement  . PORTACATH PLACEMENT  03/10/2018  . TONSILLECTOMY  1960    FAMILY HISTORY :   Family History  Problem Relation Age of Onset  . Breast cancer Mother 61  . CAD Father     SOCIAL HISTORY:   Social History   Tobacco Use  . Smoking status: Former Smoker    Packs/day: 1.00    Types: Cigarettes    Quit date: 10/25/2005    Years since quitting: 14.3  . Smokeless tobacco: Never Used  Substance Use Topics  . Alcohol use: Not Currently    Comment: occasional glass of beer  . Drug use: Never    ALLERGIES:  is allergic to penicillins.  MEDICATIONS:  Current Outpatient Medications  Medication Sig Dispense Refill  . albuterol (PROVENTIL HFA) 108 (90 Base) MCG/ACT inhaler Inhale 2 puffs into the lungs every 6 (six) hours as needed for wheezing or shortness of breath.     . Cholecalciferol (VITAMIN D3) 1000 units CAPS Take 3,000 Units by  mouth daily.     Marland Kitchen latanoprost (XALATAN) 0.005 % ophthalmic solution Apply 1 drop to eye daily.    . ondansetron (ZOFRAN) 8 MG tablet One pill every 8 hours as needed for nausea/vomitting. 40 tablet 1  . oxyCODONE (OXY IR/ROXICODONE) 5 MG immediate release tablet Take 1 tablet (5 mg total) by mouth 2 (two) times daily as needed for moderate pain or severe pain. 30 tablet 0  . TRELEGY ELLIPTA 100-62.5-25 MCG/INH AEPB      No current facility-administered medications for this visit.    PHYSICAL EXAMINATION: ECOG PERFORMANCE STATUS: 1 - Symptomatic but completely ambulatory  BP 108/65 (BP Location: Left Arm,  Patient Position: Sitting, Cuff Size: Normal)   Pulse 88   Temp (!) 95.4 F (35.2 C) (Tympanic)   Wt 142 lb (64.4 kg)   SpO2 96% Comment: room air  BMI 24.37 kg/m   Filed Weights   02/22/20 0925  Weight: 142 lb (64.4 kg)    Physical Exam  Constitutional: She is oriented to person, place, and time.  Frail-appearing Caucasian female patient..  She is walking herself.  HENT:  Head: Normocephalic and atraumatic.  Mouth/Throat: Oropharynx is clear and moist. No oropharyngeal exudate.  Eyes: Pupils are equal, round, and reactive to light.  Cardiovascular: Normal rate and regular rhythm.  Pulmonary/Chest: No respiratory distress. She has no wheezes.  Decreased breath sounds in the right compared to the left.  Abdominal: Soft. Bowel sounds are normal. She exhibits no distension and no mass. There is no abdominal tenderness. There is no rebound and no guarding.  Musculoskeletal:        General: No tenderness or edema. Normal range of motion.     Cervical back: Normal range of motion and neck supple.  Neurological: She is alert and oriented to person, place, and time.  Skin: Skin is warm.  Psychiatric: Affect normal.    LABORATORY DATA:  I have reviewed the data as listed    Component Value Date/Time   NA 140 02/22/2020 0909   K 3.8 02/22/2020 0909   CL 106 02/22/2020 0909   CO2 24 02/22/2020 0909   GLUCOSE 101 (H) 02/22/2020 0909   BUN 20 02/22/2020 0909   CREATININE 1.28 (H) 02/22/2020 0909   CALCIUM 8.9 02/22/2020 0909   PROT 7.6 02/22/2020 0909   ALBUMIN 3.5 02/22/2020 0909   AST 19 02/22/2020 0909   ALT 18 02/22/2020 0909   ALKPHOS 78 02/22/2020 0909   BILITOT 0.4 02/22/2020 0909   GFRNONAA 39 (L) 02/22/2020 0909   GFRAA 45 (L) 02/22/2020 0909    No results found for: SPEP, UPEP  Lab Results  Component Value Date   WBC 5.5 02/22/2020   NEUTROABS 4.0 02/22/2020   HGB 10.2 (L) 02/22/2020   HCT 32.2 (L) 02/22/2020   MCV 92.3 02/22/2020   PLT 415 (H) 02/22/2020       Chemistry      Component Value Date/Time   NA 140 02/22/2020 0909   K 3.8 02/22/2020 0909   CL 106 02/22/2020 0909   CO2 24 02/22/2020 0909   BUN 20 02/22/2020 0909   CREATININE 1.28 (H) 02/22/2020 0909      Component Value Date/Time   CALCIUM 8.9 02/22/2020 0909   ALKPHOS 78 02/22/2020 0909   AST 19 02/22/2020 0909   ALT 18 02/22/2020 0909   BILITOT 0.4 02/22/2020 0909       RADIOGRAPHIC STUDIES: I have personally reviewed the radiological images as listed and agreed with  the findings in the report. No results found.   ASSESSMENT & PLAN:  Primary cancer of right upper lobe of lung ([place]) # Right upper lobe lung cancer-progressive/metastatic-stage IV; CT scan March 30-stable lung nodules/mild increased; however progressive liver lesion ~4 cm.  Currently on single agent gemcitabine.  # proceed with gemcitabine cycle #2 day-1 today. Labs today reviewed;  acceptable for treatment today.  We plan imaging after 3 cycles.  #Right chest wall pain-likely secondary to pleurisy/scar tissue chronic-stable.  #COPD stable continue current medication/inhalers.   # Weight loss-stable.  # DISPOSITION: # proceed with chemo today # 1 week-labs- cbc/bmp;GEM # 3 week- MD- labs- Cbc/Cmp; Gemcitabine-Dr.B.     Orders Placed This Encounter  Procedures  . CBC with Differential    Standing Status:   Future    Standing Expiration Date:   02/21/2021  . Basic metabolic panel    Standing Status:   Future    Standing Expiration Date:   02/21/2021   All questions were answered. The patient knows to call the clinic with any problems, questions or concerns.      [name], MD 02/24/2020 2:29 PM

## 2020-02-29 ENCOUNTER — Inpatient Hospital Stay: Payer: Medicare Other

## 2020-02-29 ENCOUNTER — Other Ambulatory Visit: Payer: Self-pay

## 2020-02-29 ENCOUNTER — Inpatient Hospital Stay: Payer: Medicare Other | Attending: Internal Medicine

## 2020-02-29 VITALS — BP 106/51 | HR 89 | Temp 96.5°F | Resp 18 | Wt 142.4 lb

## 2020-02-29 DIAGNOSIS — J449 Chronic obstructive pulmonary disease, unspecified: Secondary | ICD-10-CM | POA: Diagnosis not present

## 2020-02-29 DIAGNOSIS — C3411 Malignant neoplasm of upper lobe, right bronchus or lung: Secondary | ICD-10-CM

## 2020-02-29 DIAGNOSIS — R0789 Other chest pain: Secondary | ICD-10-CM | POA: Diagnosis not present

## 2020-02-29 DIAGNOSIS — Z79899 Other long term (current) drug therapy: Secondary | ICD-10-CM | POA: Insufficient documentation

## 2020-02-29 DIAGNOSIS — Z5111 Encounter for antineoplastic chemotherapy: Secondary | ICD-10-CM | POA: Insufficient documentation

## 2020-02-29 DIAGNOSIS — Z7189 Other specified counseling: Secondary | ICD-10-CM

## 2020-02-29 LAB — BASIC METABOLIC PANEL
Anion gap: 10 (ref 5–15)
BUN: 20 mg/dL (ref 8–23)
CO2: 25 mmol/L (ref 22–32)
Calcium: 9.3 mg/dL (ref 8.9–10.3)
Chloride: 105 mmol/L (ref 98–111)
Creatinine, Ser: 1.23 mg/dL — ABNORMAL HIGH (ref 0.44–1.00)
GFR calc Af Amer: 48 mL/min — ABNORMAL LOW (ref >60)
GFR calc non Af Amer: 41 mL/min — ABNORMAL LOW (ref >60)
Glucose, Bld: 114 mg/dL — ABNORMAL HIGH (ref 70–99)
Potassium: 3.8 mmol/L (ref 3.5–5.1)
Sodium: 140 mmol/L (ref 135–145)

## 2020-02-29 LAB — CBC WITH DIFFERENTIAL/PLATELET
Abs Immature Granulocytes: 0.08 10*3/uL — ABNORMAL HIGH (ref 0.00–0.07)
Basophils Absolute: 0 10*3/uL (ref 0.0–0.1)
Basophils Relative: 0 %
Eosinophils Absolute: 0 10*3/uL (ref 0.0–0.5)
Eosinophils Relative: 0 %
HCT: 30.6 % — ABNORMAL LOW (ref 36.0–46.0)
Hemoglobin: 10 g/dL — ABNORMAL LOW (ref 12.0–15.0)
Immature Granulocytes: 2 %
Lymphocytes Relative: 18 %
Lymphs Abs: 0.8 10*3/uL (ref 0.7–4.0)
MCH: 29.9 pg (ref 26.0–34.0)
MCHC: 32.7 g/dL (ref 30.0–36.0)
MCV: 91.3 fL (ref 80.0–100.0)
Monocytes Absolute: 0.7 10*3/uL (ref 0.1–1.0)
Monocytes Relative: 14 %
Neutro Abs: 3.1 10*3/uL (ref 1.7–7.7)
Neutrophils Relative %: 66 %
Platelets: 328 10*3/uL (ref 150–400)
RBC: 3.35 MIL/uL — ABNORMAL LOW (ref 3.87–5.11)
RDW: 20.1 % — ABNORMAL HIGH (ref 11.5–15.5)
WBC: 4.7 10*3/uL (ref 4.0–10.5)
nRBC: 0.4 % — ABNORMAL HIGH (ref 0.0–0.2)

## 2020-02-29 MED ORDER — PROCHLORPERAZINE MALEATE 10 MG PO TABS
10.0000 mg | ORAL_TABLET | Freq: Once | ORAL | Status: AC
  Administered 2020-02-29: 10 mg via ORAL
  Filled 2020-02-29: qty 1

## 2020-02-29 MED ORDER — SODIUM CHLORIDE 0.9 % IV SOLN
1400.0000 mg | Freq: Once | INTRAVENOUS | Status: AC
  Administered 2020-02-29: 1400 mg via INTRAVENOUS
  Filled 2020-02-29: qty 26.3

## 2020-02-29 MED ORDER — HEPARIN SOD (PORK) LOCK FLUSH 100 UNIT/ML IV SOLN
500.0000 [IU] | Freq: Once | INTRAVENOUS | Status: AC | PRN
  Administered 2020-02-29: 500 [IU]
  Filled 2020-02-29: qty 5

## 2020-02-29 MED ORDER — SODIUM CHLORIDE 0.9 % IV SOLN
Freq: Once | INTRAVENOUS | Status: AC
  Filled 2020-02-29: qty 250

## 2020-02-29 NOTE — Progress Notes (Signed)
8184: Port flushes without difficulty, no blood return noted. No swelling or redness noted, pt denies pain. Dr. Rogue Bussing aware, Per Dr. Rogue Bussing proceed with scheduled Gemzar treatment.

## 2020-03-07 NOTE — Progress Notes (Signed)

## 2020-03-14 ENCOUNTER — Other Ambulatory Visit: Payer: Self-pay | Admitting: *Deleted

## 2020-03-14 ENCOUNTER — Inpatient Hospital Stay (HOSPITAL_BASED_OUTPATIENT_CLINIC_OR_DEPARTMENT_OTHER): Payer: Medicare Other | Admitting: Internal Medicine

## 2020-03-14 ENCOUNTER — Telehealth: Payer: Self-pay | Admitting: *Deleted

## 2020-03-14 ENCOUNTER — Other Ambulatory Visit: Payer: Self-pay

## 2020-03-14 ENCOUNTER — Inpatient Hospital Stay: Payer: Medicare Other

## 2020-03-14 VITALS — BP 138/65 | HR 82 | Temp 95.6°F | Resp 18 | Wt 142.1 lb

## 2020-03-14 DIAGNOSIS — Z5111 Encounter for antineoplastic chemotherapy: Secondary | ICD-10-CM | POA: Diagnosis not present

## 2020-03-14 DIAGNOSIS — C3411 Malignant neoplasm of upper lobe, right bronchus or lung: Secondary | ICD-10-CM

## 2020-03-14 DIAGNOSIS — Z7189 Other specified counseling: Secondary | ICD-10-CM

## 2020-03-14 LAB — CBC WITH DIFFERENTIAL/PLATELET
Abs Immature Granulocytes: 0.07 10*3/uL (ref 0.00–0.07)
Basophils Absolute: 0.1 10*3/uL (ref 0.0–0.1)
Basophils Relative: 1 %
Eosinophils Absolute: 0.3 10*3/uL (ref 0.0–0.5)
Eosinophils Relative: 2 %
HCT: 30.6 % — ABNORMAL LOW (ref 36.0–46.0)
Hemoglobin: 9.8 g/dL — ABNORMAL LOW (ref 12.0–15.0)
Immature Granulocytes: 1 %
Lymphocytes Relative: 8 %
Lymphs Abs: 0.9 10*3/uL (ref 0.7–4.0)
MCH: 30.2 pg (ref 26.0–34.0)
MCHC: 32 g/dL (ref 30.0–36.0)
MCV: 94.2 fL (ref 80.0–100.0)
Monocytes Absolute: 1.1 10*3/uL — ABNORMAL HIGH (ref 0.1–1.0)
Monocytes Relative: 10 %
Neutro Abs: 8.4 10*3/uL — ABNORMAL HIGH (ref 1.7–7.7)
Neutrophils Relative %: 78 %
Platelets: 388 10*3/uL (ref 150–400)
RBC: 3.25 MIL/uL — ABNORMAL LOW (ref 3.87–5.11)
RDW: 20.9 % — ABNORMAL HIGH (ref 11.5–15.5)
WBC: 10.8 10*3/uL — ABNORMAL HIGH (ref 4.0–10.5)
nRBC: 0 % (ref 0.0–0.2)

## 2020-03-14 LAB — COMPREHENSIVE METABOLIC PANEL
ALT: 22 U/L (ref 0–44)
AST: 22 U/L (ref 15–41)
Albumin: 3.5 g/dL (ref 3.5–5.0)
Alkaline Phosphatase: 90 U/L (ref 38–126)
Anion gap: 9 (ref 5–15)
BUN: 27 mg/dL — ABNORMAL HIGH (ref 8–23)
CO2: 23 mmol/L (ref 22–32)
Calcium: 9.3 mg/dL (ref 8.9–10.3)
Chloride: 107 mmol/L (ref 98–111)
Creatinine, Ser: 1.19 mg/dL — ABNORMAL HIGH (ref 0.44–1.00)
GFR calc Af Amer: 50 mL/min — ABNORMAL LOW (ref >60)
GFR calc non Af Amer: 43 mL/min — ABNORMAL LOW (ref >60)
Glucose, Bld: 100 mg/dL — ABNORMAL HIGH (ref 70–99)
Potassium: 3.9 mmol/L (ref 3.5–5.1)
Sodium: 139 mmol/L (ref 135–145)
Total Bilirubin: 0.4 mg/dL (ref 0.3–1.2)
Total Protein: 7.7 g/dL (ref 6.5–8.1)

## 2020-03-14 MED ORDER — HEPARIN SOD (PORK) LOCK FLUSH 100 UNIT/ML IV SOLN
INTRAVENOUS | Status: AC
  Filled 2020-03-14: qty 5

## 2020-03-14 MED ORDER — PROCHLORPERAZINE MALEATE 10 MG PO TABS
10.0000 mg | ORAL_TABLET | Freq: Once | ORAL | Status: AC
  Administered 2020-03-14: 10 mg via ORAL
  Filled 2020-03-14: qty 1

## 2020-03-14 MED ORDER — OXYCODONE HCL 5 MG PO TABS: 5.0000 mg | ORAL_TABLET | Freq: Two times a day (BID) | ORAL | 0 refills | Status: DC | PRN

## 2020-03-14 MED ORDER — SODIUM CHLORIDE 0.9% FLUSH
10.0000 mL | INTRAVENOUS | Status: DC | PRN
  Administered 2020-03-14: 10 mL
  Filled 2020-03-14: qty 10

## 2020-03-14 MED ORDER — SODIUM CHLORIDE 0.9 % IV SOLN
Freq: Once | INTRAVENOUS | Status: AC
  Filled 2020-03-14: qty 250

## 2020-03-14 MED ORDER — HEPARIN SOD (PORK) LOCK FLUSH 100 UNIT/ML IV SOLN
500.0000 [IU] | Freq: Once | INTRAVENOUS | Status: AC | PRN
  Administered 2020-03-14: 500 [IU]
  Filled 2020-03-14: qty 5

## 2020-03-14 MED ORDER — SODIUM CHLORIDE 0.9 % IV SOLN
1400.0000 mg | Freq: Once | INTRAVENOUS | Status: AC
  Administered 2020-03-14: 1400 mg via INTRAVENOUS
  Filled 2020-03-14: qty 26.3

## 2020-03-14 NOTE — Progress Notes (Signed)
[place] OFFICE PROGRESS NOTE  Patient Care Team: Leonel Ramsay, MD as PCP - General (Infectious Diseases) Telford Nab, RN as Registered Nurse [name], MD as Medical Oncologist (Medical Oncology)  Cancer Staging No matching staging information was found for the patient.   Oncology History Overview Note  # April 2019- RUL LUNG CANCER-non-small cell; favor squamous cell; PET scan T4N0 [right upper lobe mass involving[mediastinal/blood results] vs small pleural effusion [?M1]- CARBO-TAXOL- RT;   #October 2020-CT progressive subcentimeter right-sided lung lesions;  # NOV 17th 2020- KEYTRUDA q3 W  # 24th FEB 2021- Carbo-Taxol weekly; # 5 treatments.  March 30-2021 progressive disease liver lesion; stable lung.  #February 01, 2020-gemcitabine [800 mg/m]  # Hx of Right breast ca [s/p lumpec; RT; No chemo; "pill" x5 years]  # COPD/Hx of smoking  # PALLIATIVE CARE: 12/19/2019- DELCINED  MOLECULAR TESTING/OMNISEQ: PDL-1 25% [TPS; TMB-H; No targets**]  --------------------------------------------------    DIAGNOSIS: [ April 2019]SQUAMOUS CELL LUNG CA  STAGE: IV/Recurrent ;GOALS: PALLIATIVE  CURRENT/MOST RECENT THERAPY-  gemcitabine [C] .     Primary cancer of right upper lobe of lung ([place])  03/13/2018 - 05/15/2018 Chemotherapy   The patient had palonosetron (ALOXI) injection 0.25 mg, 0.25 mg, Intravenous,  Once, 9 of 9 cycles Administration: 0.25 mg (03/13/2018), 0.25 mg (04/10/2018), 0.25 mg (04/17/2018), 0.25 mg (03/21/2018), 0.25 mg (04/25/2018), 0.25 mg (03/27/2018), 0.25 mg (04/03/2018), 0.25 mg (05/01/2018), 0.25 mg (05/08/2018) CARBOplatin (PARAPLATIN) 130 mg in sodium chloride 0.9 % 250 mL chemo infusion, 130 mg (100 % of original dose 131.4 mg), Intravenous,  Once, 9 of 9 cycles Dose modification:   (original dose 131.4 mg, Cycle 1) Administration: 130 mg (03/13/2018), 130 mg (04/10/2018), 130 mg (04/17/2018), 130 mg (03/21/2018), 130 mg (04/25/2018), 130  mg (03/27/2018), 130 mg (04/03/2018), 130 mg (05/01/2018), 130 mg (05/08/2018) PACLitaxel (TAXOL) 78 mg in sodium chloride 0.9 % 250 mL chemo infusion (</= 22m/m2), 45 mg/m2 = 78 mg, Intravenous,  Once, 9 of 9 cycles Administration: 78 mg (03/13/2018), 78 mg (04/10/2018), 78 mg (04/17/2018), 78 mg (03/21/2018), 78 mg (04/25/2018), 78 mg (03/27/2018), 78 mg (04/03/2018), 78 mg (05/01/2018), 78 mg (05/08/2018)  for chemotherapy treatment.    09/12/2019 - 12/04/2019 Chemotherapy   The patient had pembrolizumab (KEYTRUDA) 200 mg in sodium chloride 0.9 % 50 mL chemo infusion, 200 mg, Intravenous, Once, 4 of 6 cycles Administration: 200 mg (09/12/2019), 200 mg (10/03/2019), 200 mg (10/24/2019), 200 mg (11/14/2019)  for chemotherapy treatment.    12/19/2019 - 01/24/2020 Chemotherapy   The patient had palonosetron (ALOXI) injection 0.25 mg, 0.25 mg, Intravenous,  Once, 5 of 8 cycles Administration: 0.25 mg (12/19/2019), 0.25 mg (12/28/2019), 0.25 mg (01/18/2020), 0.25 mg (01/04/2020), 0.25 mg (01/11/2020) CARBOplatin (PARAPLATIN) 120 mg in sodium chloride 0.9 % 100 mL chemo infusion, 120 mg (100 % of original dose 122 mg), Intravenous,  Once, 5 of 8 cycles Dose modification:   (original dose 122 mg, Cycle 1) Administration: 120 mg (12/19/2019), 120 mg (12/28/2019), 120 mg (01/18/2020), 120 mg (01/04/2020), 120 mg (01/11/2020) PACLitaxel (TAXOL) 138 mg in sodium chloride 0.9 % 250 mL chemo infusion (</= 897mm2), 80 mg/m2 = 138 mg, Intravenous,  Once, 5 of 8 cycles Administration: 138 mg (12/19/2019), 138 mg (12/28/2019), 138 mg (01/18/2020), 138 mg (01/04/2020), 138 mg (01/11/2020)  for chemotherapy treatment.    02/01/2020 -  Chemotherapy   The patient had gemcitabine (GEMZAR) 1,400 mg in sodium chloride 0.9 % 250 mL chemo infusion, 1,368 mg (100 % of original  dose 800 mg/m2), Intravenous,  Once, 2 of 4 cycles Dose modification: 800 mg/m2 (original dose 800 mg/m2, Cycle 1, Reason: Provider Judgment) Administration: 1,400 mg (02/01/2020), 1,400  mg (02/08/2020), 1,400 mg (02/22/2020), 1,400 mg (02/29/2020)  for chemotherapy treatment.        INTERVAL HISTORY: Patient a poor historian.  Brenda Parker 84 y.o.  female patient with recurrent/metastatic squamous cell lung cancer-currently on  gemcitabine chemotherapy is here for follow-up.  Patient is here to proceed with cycle number 3-day 1 chemotherapy.   Patient continues to complain of ongoing back pain.  Chronic.  Progressive getting worse.  Patient has been using Tylenol.  Patient had noted improvement on oxycodone.  However she ran out of oxycodone recently.  She previously had "injections" with significant improvement in the past.  Her appetite is fair.  No weight loss.  No nausea no vomiting.  No skin rash.  Chronic mild fatigue.   Review of Systems  Constitutional: Positive for malaise/fatigue. Negative for chills, diaphoresis and fever.  HENT: Negative for nosebleeds and sore throat.   Eyes: Negative for double vision.  Respiratory: Positive for cough and shortness of breath. Negative for hemoptysis, sputum production and wheezing.   Cardiovascular: Negative for chest pain, palpitations and orthopnea.  Gastrointestinal: Negative for abdominal pain, blood in stool, heartburn, melena, nausea and vomiting.  Genitourinary: Negative for dysuria, frequency and urgency.  Musculoskeletal: Positive for back pain and joint pain.  Skin: Negative.  Negative for itching and rash.  Neurological: Negative for tingling, focal weakness, weakness and headaches.  Endo/Heme/Allergies: Does not bruise/bleed easily.  Psychiatric/Behavioral: Negative for depression. The patient is not nervous/anxious and does not have insomnia.     PAST MEDICAL HISTORY :  Past Medical History:  Diagnosis Date  . Benign liver cyst   . Breast cancer ([place]) 2005   right breast  . COPD (chronic obstructive pulmonary disease) ([place])   . Dyspnea   . Glaucoma   . Hemoptysis 2019  . Hypertension    Not on  medication for htn. patient denies this  . Lung mass 01/2018  . Personal history of radiation therapy     PAST SURGICAL HISTORY :   Past Surgical History:  Procedure Laterality Date  . BREAST EXCISIONAL BIOPSY Right 2005   positive, radiation  . BREAST LUMPECTOMY Right 03/2004  . CHOLECYSTECTOMY  1988  . COLONOSCOPY  2012  . ENDOBRONCHIAL ULTRASOUND N/A 02/21/2018   Procedure: ENDOBRONCHIAL ULTRASOUND;  Surgeon: Laverle Hobby, MD;  Location: [facility] ORS;  Service: Pulmonary;  Laterality: N/A;  . EYE SURGERY Bilateral 2009,2010   cataract extractions with lens implant  . GALLBLADDER SURGERY Bilateral   . IR FLUORO GUIDE PORT INSERTION RIGHT  03/10/2018  . JOINT REPLACEMENT Left 2008   left hip replacement  . PORTACATH PLACEMENT  03/10/2018  . TONSILLECTOMY  1960    FAMILY HISTORY :   Family History  Problem Relation Age of Onset  . Breast cancer Mother 78  . CAD Father     SOCIAL HISTORY:   Social History   Tobacco Use  . Smoking status: Former Smoker    Packs/day: 1.00    Types: Cigarettes    Quit date: 10/25/2005    Years since quitting: 14.3  . Smokeless tobacco: Never Used  Substance Use Topics  . Alcohol use: Not Currently    Comment: occasional glass of beer  . Drug use: Never    ALLERGIES:  is allergic to penicillins.  MEDICATIONS:  Current Outpatient Medications  Medication Sig Dispense Refill  . albuterol (PROVENTIL HFA) 108 (90 Base) MCG/ACT inhaler Inhale 2 puffs into the lungs every 6 (six) hours as needed for wheezing or shortness of breath.     . Cholecalciferol (VITAMIN D3) 1000 units CAPS Take 3,000 Units by mouth daily.     Marland Kitchen latanoprost (XALATAN) 0.005 % ophthalmic solution Apply 1 drop to eye daily.    . ondansetron (ZOFRAN) 8 MG tablet One pill every 8 hours as needed for nausea/vomitting. 40 tablet 1  . oxyCODONE (OXY IR/ROXICODONE) 5 MG immediate release tablet Take 1 tablet (5 mg total) by mouth 2 (two) times daily as needed for moderate  pain or severe pain. 45 tablet 0  . TRELEGY ELLIPTA 100-62.5-25 MCG/INH AEPB      No current facility-administered medications for this visit.    PHYSICAL EXAMINATION: ECOG PERFORMANCE STATUS: 1 - Symptomatic but completely ambulatory  BP 138/65 (BP Location: Left Arm, Patient Position: Sitting, Cuff Size: Normal)   Pulse 82   Temp (!) 95.6 F (35.3 C) (Tympanic)   Resp 18   Wt 142 lb 1.6 oz (64.5 kg)   SpO2 99%   BMI 24.39 kg/m   Filed Weights   03/14/20 1006  Weight: 142 lb 1.6 oz (64.5 kg)    Physical Exam  Constitutional: She is oriented to person, place, and time.  Frail-appearing Caucasian female patient..  She is walking herself.  HENT:  Head: Normocephalic and atraumatic.  Mouth/Throat: Oropharynx is clear and moist. No oropharyngeal exudate.  Eyes: Pupils are equal, round, and reactive to light.  Cardiovascular: Normal rate and regular rhythm.  Pulmonary/Chest: No respiratory distress. She has no wheezes.  Decreased breath sounds in the right compared to the left.  Abdominal: Soft. Bowel sounds are normal. She exhibits no distension and no mass. There is no abdominal tenderness. There is no rebound and no guarding.  Musculoskeletal:        General: No tenderness or edema. Normal range of motion.     Cervical back: Normal range of motion and neck supple.  Neurological: She is alert and oriented to person, place, and time.  Skin: Skin is warm.  Psychiatric: Affect normal.    LABORATORY DATA:  I have reviewed the data as listed    Component Value Date/Time   NA 139 03/14/2020 0942   K 3.9 03/14/2020 0942   CL 107 03/14/2020 0942   CO2 23 03/14/2020 0942   GLUCOSE 100 (H) 03/14/2020 0942   BUN 27 (H) 03/14/2020 0942   CREATININE 1.19 (H) 03/14/2020 0942   CALCIUM 9.3 03/14/2020 0942   PROT 7.7 03/14/2020 0942   ALBUMIN 3.5 03/14/2020 0942   AST 22 03/14/2020 0942   ALT 22 03/14/2020 0942   ALKPHOS 90 03/14/2020 0942   BILITOT 0.4 03/14/2020 0942    GFRNONAA 43 (L) 03/14/2020 0942   GFRAA 50 (L) 03/14/2020 0942    No results found for: SPEP, UPEP  Lab Results  Component Value Date   WBC 10.8 (H) 03/14/2020   NEUTROABS 8.4 (H) 03/14/2020   HGB 9.8 (L) 03/14/2020   HCT 30.6 (L) 03/14/2020   MCV 94.2 03/14/2020   PLT 388 03/14/2020      Chemistry      Component Value Date/Time   NA 139 03/14/2020 0942   K 3.9 03/14/2020 0942   CL 107 03/14/2020 0942   CO2 23 03/14/2020 0942   BUN 27 (H) 03/14/2020 0942   CREATININE 1.19 (H) 03/14/2020 9767  Component Value Date/Time   CALCIUM 9.3 03/14/2020 0942   ALKPHOS 90 03/14/2020 0942   AST 22 03/14/2020 0942   ALT 22 03/14/2020 0942   BILITOT 0.4 03/14/2020 0942       RADIOGRAPHIC STUDIES: I have personally reviewed the radiological images as listed and agreed with the findings in the report. No results found.   ASSESSMENT & PLAN:  Primary cancer of right upper lobe of lung ([place]) # Right upper lobe lung cancer-progressive/metastatic-stage IV; CT scan March 30-stable lung nodules/mild increased; however progressive liver lesion ~4 cm.  Currently on single agent gemcitabine.  # proceed with gemcitabine cycle #3 day-1 today. Labs today reviewed;  acceptable for treatment today. Will plan CT scan; will order today.   # Back pain-recommend taking oxycodone.  New prescription given.  Recommend referral to Dr.Chasnis.   #COPD stable continue current medication/inhalers.   # DISPOSITION: # referral to Dr.Chasnis re: back pain # proceed with chemo today # 1 week-labs- cbc/bmp;GEM # 3 week- MD- labs- Cbc/Cmp; Gemcitabine; CT scan prior- -Dr.B.     Orders Placed This Encounter  Procedures  . CT CHEST WO CONTRAST    Standing Status:   Future    Standing Expiration Date:   03/14/2021    Order Specific Question:   Preferred imaging location?    Answer:   [facility]    Order Specific Question:   Radiology Contrast Protocol - do NOT remove file path    Answer:    [email or web address]    Order Specific Question:   ** REASON FOR EXAM (FREE TEXT)    Answer:   lung cancer   All questions were answered. The patient knows to call the clinic with any problems, questions or concerns.      [name], MD 03/14/2020 10:20 AM

## 2020-03-14 NOTE — Assessment & Plan Note (Addendum)
#   Right upper lobe lung cancer-progressive/metastatic-stage IV; CT scan March 30-stable lung nodules/mild increased; however progressive liver lesion ~4 cm.  Currently on single agent gemcitabine.  # proceed with gemcitabine cycle #3 day-1 today. Labs today reviewed;  acceptable for treatment today. Will plan CT scan; will order today.   # Back pain-recommend taking oxycodone.  New prescription given.  Recommend referral to Dr.Chasnis.   #COPD stable continue current medication/inhalers.   # DISPOSITION: # referral to Dr.Chasnis re: back pain # proceed with chemo today # 1 week-labs- cbc/bmp;GEM # 3 week- MD- labs- Cbc/Cmp; Gemcitabine; CT scan prior- -Dr.B.

## 2020-03-14 NOTE — Telephone Encounter (Signed)
Referral faxed to Dr. [name] - orthopedic.for management of chronic back pain. ([id number]. On 03/14/2020)

## 2020-03-21 ENCOUNTER — Other Ambulatory Visit: Payer: Self-pay

## 2020-03-21 ENCOUNTER — Inpatient Hospital Stay: Payer: Medicare Other

## 2020-03-21 ENCOUNTER — Ambulatory Visit: Payer: Medicare Other | Admitting: Oncology

## 2020-03-21 VITALS — BP 127/72 | HR 79 | Temp 96.7°F | Resp 18 | Wt 140.8 lb

## 2020-03-21 DIAGNOSIS — Z7189 Other specified counseling: Secondary | ICD-10-CM

## 2020-03-21 DIAGNOSIS — Z5111 Encounter for antineoplastic chemotherapy: Secondary | ICD-10-CM | POA: Diagnosis not present

## 2020-03-21 DIAGNOSIS — Z95828 Presence of other vascular implants and grafts: Secondary | ICD-10-CM

## 2020-03-21 DIAGNOSIS — C3411 Malignant neoplasm of upper lobe, right bronchus or lung: Secondary | ICD-10-CM

## 2020-03-21 LAB — CBC WITH DIFFERENTIAL/PLATELET
Abs Immature Granulocytes: 0.03 10*3/uL (ref 0.00–0.07)
Basophils Absolute: 0 10*3/uL (ref 0.0–0.1)
Basophils Relative: 1 %
Eosinophils Absolute: 0.1 10*3/uL (ref 0.0–0.5)
Eosinophils Relative: 3 %
HCT: 28.5 % — ABNORMAL LOW (ref 36.0–46.0)
Hemoglobin: 9.2 g/dL — ABNORMAL LOW (ref 12.0–15.0)
Immature Granulocytes: 1 %
Lymphocytes Relative: 24 %
Lymphs Abs: 0.8 10*3/uL (ref 0.7–4.0)
MCH: 30.4 pg (ref 26.0–34.0)
MCHC: 32.3 g/dL (ref 30.0–36.0)
MCV: 94.1 fL (ref 80.0–100.0)
Monocytes Absolute: 0.7 10*3/uL (ref 0.1–1.0)
Monocytes Relative: 19 %
Neutro Abs: 1.8 10*3/uL (ref 1.7–7.7)
Neutrophils Relative %: 52 %
Platelets: 505 10*3/uL — ABNORMAL HIGH (ref 150–400)
RBC: 3.03 MIL/uL — ABNORMAL LOW (ref 3.87–5.11)
RDW: 19.9 % — ABNORMAL HIGH (ref 11.5–15.5)
WBC: 3.4 10*3/uL — ABNORMAL LOW (ref 4.0–10.5)
nRBC: 0 % (ref 0.0–0.2)

## 2020-03-21 LAB — COMPREHENSIVE METABOLIC PANEL
ALT: 29 U/L (ref 0–44)
AST: 30 U/L (ref 15–41)
Albumin: 3.3 g/dL — ABNORMAL LOW (ref 3.5–5.0)
Alkaline Phosphatase: 107 U/L (ref 38–126)
Anion gap: 9 (ref 5–15)
BUN: 19 mg/dL (ref 8–23)
CO2: 23 mmol/L (ref 22–32)
Calcium: 9.2 mg/dL (ref 8.9–10.3)
Chloride: 105 mmol/L (ref 98–111)
Creatinine, Ser: 1.38 mg/dL — ABNORMAL HIGH (ref 0.44–1.00)
GFR calc Af Amer: 41 mL/min — ABNORMAL LOW (ref >60)
GFR calc non Af Amer: 36 mL/min — ABNORMAL LOW (ref >60)
Glucose, Bld: 105 mg/dL — ABNORMAL HIGH (ref 70–99)
Potassium: 3.8 mmol/L (ref 3.5–5.1)
Sodium: 137 mmol/L (ref 135–145)
Total Bilirubin: 0.4 mg/dL (ref 0.3–1.2)
Total Protein: 7.5 g/dL (ref 6.5–8.1)

## 2020-03-21 MED ORDER — HEPARIN SOD (PORK) LOCK FLUSH 100 UNIT/ML IV SOLN
500.0000 [IU] | Freq: Once | INTRAVENOUS | Status: AC | PRN
  Administered 2020-03-21: 500 [IU]
  Filled 2020-03-21: qty 5

## 2020-03-21 MED ORDER — SODIUM CHLORIDE 0.9 % IV SOLN
Freq: Once | INTRAVENOUS | Status: AC
  Filled 2020-03-21: qty 250

## 2020-03-21 MED ORDER — PROCHLORPERAZINE MALEATE 10 MG PO TABS
10.0000 mg | ORAL_TABLET | Freq: Once | ORAL | Status: AC
  Administered 2020-03-21: 10 mg via ORAL
  Filled 2020-03-21: qty 1

## 2020-03-21 MED ORDER — SODIUM CHLORIDE 0.9% FLUSH
10.0000 mL | INTRAVENOUS | Status: DC | PRN
  Administered 2020-03-21: 10 mL via INTRAVENOUS
  Filled 2020-03-21: qty 10

## 2020-03-21 MED ORDER — HEPARIN SOD (PORK) LOCK FLUSH 100 UNIT/ML IV SOLN
INTRAVENOUS | Status: AC
  Filled 2020-03-21: qty 5

## 2020-03-21 MED ORDER — SODIUM CHLORIDE 0.9 % IV SOLN
1400.0000 mg | Freq: Once | INTRAVENOUS | Status: AC
  Administered 2020-03-21: 1400 mg via INTRAVENOUS
  Filled 2020-03-21: qty 26.3

## 2020-03-21 NOTE — Progress Notes (Signed)
WBC 3.4, Platelets 505, Per Dr. Grayland Ormond okay to proceed with Gemzar treatment and no further orders at this time.

## 2020-03-28 NOTE — Progress Notes (Signed)

## 2020-03-31 ENCOUNTER — Ambulatory Visit
Admission: RE | Admit: 2020-03-31 | Discharge: 2020-03-31 | Disposition: A | Discharge status: Home / Self Care | Payer: Medicare Other | Source: Ambulatory Visit | Attending: Internal Medicine | Admitting: Internal Medicine

## 2020-03-31 ENCOUNTER — Other Ambulatory Visit: Payer: Self-pay

## 2020-03-31 DIAGNOSIS — C3411 Malignant neoplasm of upper lobe, right bronchus or lung: Secondary | ICD-10-CM | POA: Diagnosis not present

## 2020-04-04 ENCOUNTER — Other Ambulatory Visit: Payer: Self-pay

## 2020-04-04 ENCOUNTER — Inpatient Hospital Stay: Payer: Medicare Other | Attending: Internal Medicine | Admitting: Internal Medicine

## 2020-04-04 ENCOUNTER — Inpatient Hospital Stay: Payer: Medicare Other

## 2020-04-04 VITALS — BP 123/70 | HR 88 | Temp 97.3°F | Wt 138.8 lb

## 2020-04-04 DIAGNOSIS — R5383 Other fatigue: Secondary | ICD-10-CM

## 2020-04-04 DIAGNOSIS — R634 Abnormal weight loss: Secondary | ICD-10-CM | POA: Diagnosis not present

## 2020-04-04 DIAGNOSIS — Z923 Personal history of irradiation: Secondary | ICD-10-CM | POA: Insufficient documentation

## 2020-04-04 DIAGNOSIS — Z6836 Body mass index (BMI) 36.0-36.9, adult: Secondary | ICD-10-CM | POA: Insufficient documentation

## 2020-04-04 DIAGNOSIS — Z853 Personal history of malignant neoplasm of breast: Secondary | ICD-10-CM | POA: Diagnosis not present

## 2020-04-04 DIAGNOSIS — C3411 Malignant neoplasm of upper lobe, right bronchus or lung: Secondary | ICD-10-CM

## 2020-04-04 DIAGNOSIS — M549 Dorsalgia, unspecified: Secondary | ICD-10-CM | POA: Insufficient documentation

## 2020-04-04 DIAGNOSIS — Z79899 Other long term (current) drug therapy: Secondary | ICD-10-CM | POA: Diagnosis not present

## 2020-04-04 DIAGNOSIS — J449 Chronic obstructive pulmonary disease, unspecified: Secondary | ICD-10-CM | POA: Insufficient documentation

## 2020-04-04 DIAGNOSIS — W19XXXA Unspecified fall, initial encounter: Secondary | ICD-10-CM | POA: Insufficient documentation

## 2020-04-04 DIAGNOSIS — Z79810 Long term (current) use of selective estrogen receptor modulators (SERMs): Secondary | ICD-10-CM

## 2020-04-04 DIAGNOSIS — Z9221 Personal history of antineoplastic chemotherapy: Secondary | ICD-10-CM | POA: Diagnosis not present

## 2020-04-04 DIAGNOSIS — R630 Anorexia: Secondary | ICD-10-CM | POA: Diagnosis not present

## 2020-04-04 LAB — CBC WITH DIFFERENTIAL/PLATELET
Abs Immature Granulocytes: 0.05 10*3/uL (ref 0.00–0.07)
Basophils Absolute: 0 10*3/uL (ref 0.0–0.1)
Basophils Relative: 1 %
Eosinophils Absolute: 0.4 10*3/uL (ref 0.0–0.5)
Eosinophils Relative: 4 %
HCT: 30 % — ABNORMAL LOW (ref 36.0–46.0)
Hemoglobin: 9.7 g/dL — ABNORMAL LOW (ref 12.0–15.0)
Immature Granulocytes: 1 %
Lymphocytes Relative: 7 %
Lymphs Abs: 0.6 10*3/uL — ABNORMAL LOW (ref 0.7–4.0)
MCH: 31.3 pg (ref 26.0–34.0)
MCHC: 32.3 g/dL (ref 30.0–36.0)
MCV: 96.8 fL (ref 80.0–100.0)
Monocytes Absolute: 1.1 10*3/uL — ABNORMAL HIGH (ref 0.1–1.0)
Monocytes Relative: 12 %
Neutro Abs: 6.6 10*3/uL (ref 1.7–7.7)
Neutrophils Relative %: 75 %
Platelets: 392 10*3/uL (ref 150–400)
RBC: 3.1 MIL/uL — ABNORMAL LOW (ref 3.87–5.11)
RDW: 19.1 % — ABNORMAL HIGH (ref 11.5–15.5)
WBC: 8.8 10*3/uL (ref 4.0–10.5)
nRBC: 0 % (ref 0.0–0.2)

## 2020-04-04 LAB — COMPREHENSIVE METABOLIC PANEL
ALT: 18 U/L (ref 0–44)
AST: 18 U/L (ref 15–41)
Albumin: 3.4 g/dL — ABNORMAL LOW (ref 3.5–5.0)
Alkaline Phosphatase: 96 U/L (ref 38–126)
Anion gap: 10 (ref 5–15)
BUN: 20 mg/dL (ref 8–23)
CO2: 22 mmol/L (ref 22–32)
Calcium: 9.2 mg/dL (ref 8.9–10.3)
Chloride: 107 mmol/L (ref 98–111)
Creatinine, Ser: 1.19 mg/dL — ABNORMAL HIGH (ref 0.44–1.00)
GFR calc Af Amer: 50 mL/min — ABNORMAL LOW (ref >60)
GFR calc non Af Amer: 43 mL/min — ABNORMAL LOW (ref >60)
Glucose, Bld: 101 mg/dL — ABNORMAL HIGH (ref 70–99)
Potassium: 3.8 mmol/L (ref 3.5–5.1)
Sodium: 139 mmol/L (ref 135–145)
Total Bilirubin: 0.5 mg/dL (ref 0.3–1.2)
Total Protein: 7.8 g/dL (ref 6.5–8.1)

## 2020-04-04 MED ORDER — HEPARIN SOD (PORK) LOCK FLUSH 100 UNIT/ML IV SOLN
500.0000 [IU] | Freq: Once | INTRAVENOUS | Status: AC
  Administered 2020-04-04: 500 [IU] via INTRAVENOUS
  Filled 2020-04-04: qty 5

## 2020-04-04 MED ORDER — HEPARIN SOD (PORK) LOCK FLUSH 100 UNIT/ML IV SOLN
INTRAVENOUS | Status: AC
  Filled 2020-04-04: qty 5

## 2020-04-04 MED ORDER — SODIUM CHLORIDE 0.9% FLUSH
10.0000 mL | Freq: Once | INTRAVENOUS | Status: AC
  Administered 2020-04-04: 10 mL via INTRAVENOUS
  Filled 2020-04-04: qty 10

## 2020-04-04 NOTE — Progress Notes (Signed)
[facility] OFFICE PROGRESS NOTE  Patient Care Team: Leonel Ramsay, MD as PCP - General (Infectious Diseases) Telford Nab, RN as Registered Nurse [name], MD as Medical Oncologist (Medical Oncology) [name], MD as Referring Physician (Physical Medicine and Rehabilitation)  Cancer Staging No matching staging information was found for the patient.   Oncology History Overview Note  # April 2019- RUL LUNG CANCER-non-small cell; favor squamous cell; PET scan T4N0 [right upper lobe mass involving[mediastinal/blood results] vs small pleural effusion [?M1]- CARBO-TAXOL- RT;   #October 2020-CT progressive subcentimeter right-sided lung lesions;  # NOV 17th 2020- KEYTRUDA q3 W  # 24th FEB 2021- Carbo-Taxol weekly; # 5 treatments.  March 30-2021 progressive disease liver lesion; stable lung.  #February 01, 2020-gemcitabine [800 mg/m]  # Hx of Right breast ca [s/p lumpec; RT; No chemo; "pill" x5 years]  # COPD/Hx of smoking  # PALLIATIVE CARE: 12/19/2019- DELCINED  MOLECULAR TESTING/OMNISEQ: PDL-1 25% [TPS; TMB-H; No targets**]  --------------------------------------------------    DIAGNOSIS: [ April 2019]SQUAMOUS CELL LUNG CA  STAGE: IV/Recurrent ;GOALS: PALLIATIVE  CURRENT/MOST RECENT THERAPY-  gemcitabine [C] .     Primary cancer of right upper lobe of lung ([place])  03/13/2018 - 05/15/2018 Chemotherapy   The patient had palonosetron (ALOXI) injection 0.25 mg, 0.25 mg, Intravenous,  Once, 9 of 9 cycles Administration: 0.25 mg (03/13/2018), 0.25 mg (04/10/2018), 0.25 mg (04/17/2018), 0.25 mg (03/21/2018), 0.25 mg (04/25/2018), 0.25 mg (03/27/2018), 0.25 mg (04/03/2018), 0.25 mg (05/01/2018), 0.25 mg (05/08/2018) CARBOplatin (PARAPLATIN) 130 mg in sodium chloride 0.9 % 250 mL chemo infusion, 130 mg (100 % of original dose 131.4 mg), Intravenous,  Once, 9 of 9 cycles Dose modification:   (original dose 131.4 mg, Cycle 1) Administration: 130 mg  (03/13/2018), 130 mg (04/10/2018), 130 mg (04/17/2018), 130 mg (03/21/2018), 130 mg (04/25/2018), 130 mg (03/27/2018), 130 mg (04/03/2018), 130 mg (05/01/2018), 130 mg (05/08/2018) PACLitaxel (TAXOL) 78 mg in sodium chloride 0.9 % 250 mL chemo infusion (</= 50m/m2), 45 mg/m2 = 78 mg, Intravenous,  Once, 9 of 9 cycles Administration: 78 mg (03/13/2018), 78 mg (04/10/2018), 78 mg (04/17/2018), 78 mg (03/21/2018), 78 mg (04/25/2018), 78 mg (03/27/2018), 78 mg (04/03/2018), 78 mg (05/01/2018), 78 mg (05/08/2018)  for chemotherapy treatment.    09/12/2019 - 12/04/2019 Chemotherapy   The patient had pembrolizumab (KEYTRUDA) 200 mg in sodium chloride 0.9 % 50 mL chemo infusion, 200 mg, Intravenous, Once, 4 of 6 cycles Administration: 200 mg (09/12/2019), 200 mg (10/03/2019), 200 mg (10/24/2019), 200 mg (11/14/2019)  for chemotherapy treatment.    12/19/2019 - 01/24/2020 Chemotherapy   The patient had palonosetron (ALOXI) injection 0.25 mg, 0.25 mg, Intravenous,  Once, 5 of 8 cycles Administration: 0.25 mg (12/19/2019), 0.25 mg (12/28/2019), 0.25 mg (01/18/2020), 0.25 mg (01/04/2020), 0.25 mg (01/11/2020) CARBOplatin (PARAPLATIN) 120 mg in sodium chloride 0.9 % 100 mL chemo infusion, 120 mg (100 % of original dose 122 mg), Intravenous,  Once, 5 of 8 cycles Dose modification:   (original dose 122 mg, Cycle 1) Administration: 120 mg (12/19/2019), 120 mg (12/28/2019), 120 mg (01/18/2020), 120 mg (01/04/2020), 120 mg (01/11/2020) PACLitaxel (TAXOL) 138 mg in sodium chloride 0.9 % 250 mL chemo infusion (</= 845mm2), 80 mg/m2 = 138 mg, Intravenous,  Once, 5 of 8 cycles Administration: 138 mg (12/19/2019), 138 mg (12/28/2019), 138 mg (01/18/2020), 138 mg (01/04/2020), 138 mg (01/11/2020)  for chemotherapy treatment.    02/01/2020 -  Chemotherapy   The patient had gemcitabine (GEMZAR) 1,400 mg in sodium chloride 0.9 %  250 mL chemo infusion, 1,368 mg (100 % of original dose 800 mg/m2), Intravenous,  Once, 3 of 4 cycles Dose modification: 800 mg/m2 (original  dose 800 mg/m2, Cycle 1, Reason: Provider Judgment) Administration: 1,400 mg (02/01/2020), 1,400 mg (02/08/2020), 1,400 mg (02/22/2020), 1,400 mg (02/29/2020), 1,400 mg (03/21/2020), 1,400 mg (03/14/2020)  for chemotherapy treatment.        INTERVAL HISTORY: Patient a poor historian.  Brenda Parker 84 y.o.  female patient with recurrent/metastatic squamous cell lung cancer-currently on  gemcitabine chemotherapy is here for follow-up/review results of the CT scan.  Patient interim fell-in the parking lot while walking to her car.  States not her lost consciousness.  Not sure how she fell.  No seizures.  She drove herself back home.  In the interim patient was also evaluated by pain management-awaiting steroid injection.  She continues to have back pain.  Taking oxycodone as needed.  Poor appetite.  Continue weight loss.  As per son noted to have worsening shortness of breath on exertion.   Review of Systems  Constitutional: Positive for malaise/fatigue. Negative for chills, diaphoresis and fever.  HENT: Negative for nosebleeds and sore throat.   Eyes: Negative for double vision.  Respiratory: Positive for cough and shortness of breath. Negative for hemoptysis, sputum production and wheezing.   Cardiovascular: Negative for chest pain, palpitations and orthopnea.  Gastrointestinal: Negative for abdominal pain, blood in stool, heartburn, melena, nausea and vomiting.  Genitourinary: Negative for dysuria, frequency and urgency.  Musculoskeletal: Positive for back pain and joint pain.  Skin: Negative.  Negative for itching and rash.  Neurological: Negative for tingling, focal weakness, weakness and headaches.  Endo/Heme/Allergies: Does not bruise/bleed easily.  Psychiatric/Behavioral: Negative for depression. The patient is not nervous/anxious and does not have insomnia.     PAST MEDICAL HISTORY :  Past Medical History:  Diagnosis Date  . Benign liver cyst   . Breast cancer ([place]) 2005   right  breast  . COPD (chronic obstructive pulmonary disease) ([place])   . Dyspnea   . Glaucoma   . Hemoptysis 2019  . Hypertension    Not on medication for htn. patient denies this  . Lung mass 01/2018  . Personal history of radiation therapy     PAST SURGICAL HISTORY :   Past Surgical History:  Procedure Laterality Date  . BREAST EXCISIONAL BIOPSY Right 2005   positive, radiation  . BREAST LUMPECTOMY Right 03/2004  . CHOLECYSTECTOMY  1988  . COLONOSCOPY  2012  . ENDOBRONCHIAL ULTRASOUND N/A 02/21/2018   Procedure: ENDOBRONCHIAL ULTRASOUND;  Surgeon: Laverle Hobby, MD;  Location: [facility] ORS;  Service: Pulmonary;  Laterality: N/A;  . EYE SURGERY Bilateral 2009,2010   cataract extractions with lens implant  . GALLBLADDER SURGERY Bilateral   . IR FLUORO GUIDE PORT INSERTION RIGHT  03/10/2018  . JOINT REPLACEMENT Left 2008   left hip replacement  . PORTACATH PLACEMENT  03/10/2018  . TONSILLECTOMY  1960    FAMILY HISTORY :   Family History  Problem Relation Age of Onset  . Breast cancer Mother 68  . CAD Father     SOCIAL HISTORY:   Social History   Tobacco Use  . Smoking status: Former Smoker    Packs/day: 1.00    Types: Cigarettes    Quit date: 10/25/2005    Years since quitting: 14.4  . Smokeless tobacco: Never Used  Vaping Use  . Vaping Use: Never used  Substance Use Topics  . Alcohol use: Not Currently  Comment: occasional glass of beer  . Drug use: Never    ALLERGIES:  is allergic to penicillins.  MEDICATIONS:  Current Outpatient Medications  Medication Sig Dispense Refill  . albuterol (PROVENTIL HFA) 108 (90 Base) MCG/ACT inhaler Inhale 2 puffs into the lungs every 6 (six) hours as needed for wheezing or shortness of breath.     . Baclofen 5 MG TABS Take 5 mg by mouth daily as needed.    . Cholecalciferol (VITAMIN D3) 1000 units CAPS Take 3,000 Units by mouth daily.     Marland Kitchen latanoprost (XALATAN) 0.005 % ophthalmic solution Apply 1 drop to eye daily.    Marland Kitchen  oxyCODONE (OXY IR/ROXICODONE) 5 MG immediate release tablet Take 1 tablet (5 mg total) by mouth 2 (two) times daily as needed for moderate pain or severe pain. 45 tablet 0  . TRELEGY ELLIPTA 100-62.5-25 MCG/INH AEPB     . ondansetron (ZOFRAN) 8 MG tablet One pill every 8 hours as needed for nausea/vomitting. (Patient not taking: Reported on 04/04/2020) 40 tablet 1   No current facility-administered medications for this visit.   Facility-Administered Medications Ordered in Other Visits  Medication Dose Route Frequency Provider Last Rate Last Admin  . heparin lock flush 100 unit/mL  500 Units Intravenous Once [name], MD        PHYSICAL EXAMINATION: ECOG PERFORMANCE STATUS: 1 - Symptomatic but completely ambulatory  BP 123/70 (BP Location: Right Arm, Patient Position: Sitting)   Pulse 88   Temp (!) 97.3 F (36.3 C) (Tympanic)   Wt 138 lb 12.8 oz (63 kg)   SpO2 96%   BMI 23.82 kg/m   Filed Weights   04/04/20 0930  Weight: 138 lb 12.8 oz (63 kg)    Physical Exam Constitutional:      Comments: [place] Caucasian female patient..  She is walking herself.  Accompanied by her son.  HENT:     Head: Normocephalic and atraumatic.     Mouth/Throat:     Pharynx: No oropharyngeal exudate.  Eyes:     Pupils: Pupils are equal, round, and reactive to light.  Cardiovascular:     Rate and Rhythm: Normal rate and regular rhythm.  Pulmonary:     Effort: No respiratory distress.     Breath sounds: No wheezing.  Abdominal:     General: Bowel sounds are normal. There is no distension.     Palpations: Abdomen is soft. There is no mass.     Tenderness: There is no abdominal tenderness. There is no guarding or rebound.  Musculoskeletal:        General: No tenderness. Normal range of motion.     Cervical back: Normal range of motion and neck supple.  Skin:    General: Skin is warm.  Neurological:     Mental Status: She is alert and oriented to person, place, and time.   Psychiatric:        Mood and Affect: Affect normal.     LABORATORY DATA:  I have reviewed the data as listed    Component Value Date/Time   NA 139 04/04/2020 0917   K 3.8 04/04/2020 0917   CL 107 04/04/2020 0917   CO2 22 04/04/2020 0917   GLUCOSE 101 (H) 04/04/2020 0917   BUN 20 04/04/2020 0917   CREATININE 1.19 (H) 04/04/2020 0917   CALCIUM 9.2 04/04/2020 0917   PROT 7.8 04/04/2020 0917   ALBUMIN 3.4 (L) 04/04/2020 0917   AST 18 04/04/2020 0917   ALT 18  04/04/2020 0917   ALKPHOS 96 04/04/2020 0917   BILITOT 0.5 04/04/2020 0917   GFRNONAA 43 (L) 04/04/2020 0917   GFRAA 50 (L) 04/04/2020 0917    No results found for: SPEP, UPEP  Lab Results  Component Value Date   WBC 8.8 04/04/2020   NEUTROABS 6.6 04/04/2020   HGB 9.7 (L) 04/04/2020   HCT 30.0 (L) 04/04/2020   MCV 96.8 04/04/2020   PLT 392 04/04/2020      Chemistry      Component Value Date/Time   NA 139 04/04/2020 0917   K 3.8 04/04/2020 0917   CL 107 04/04/2020 0917   CO2 22 04/04/2020 0917   BUN 20 04/04/2020 0917   CREATININE 1.19 (H) 04/04/2020 0917      Component Value Date/Time   CALCIUM 9.2 04/04/2020 0917   ALKPHOS 96 04/04/2020 0917   AST 18 04/04/2020 0917   ALT 18 04/04/2020 0917   BILITOT 0.5 04/04/2020 0917       RADIOGRAPHIC STUDIES: I have personally reviewed the radiological images as listed and agreed with the findings in the report. No results found.   ASSESSMENT & PLAN:  Primary cancer of right upper lobe of lung ([place]) # Right upper lobe lung cancer-progressive/metastatic-stage IV;  Currently on single agent gemcitabine; June 20 21 CT scan-stable pulmonary nodules; stable-calcified/complex liver cysts.   #Hold gemcitabine cycle #4 day-1 today-given poor tolerance to therapy [see below]. Labs today reviewed;  Acceptable.-  #Weight loss/worsening fatigue/poor appetite-likely secondary to chemotherapy.  Hold chemotherapy as planned.   # Back pain-nonmalignant s/p evaluation  with Dr.Chasnis; awaiting injection the back.  #S/p fall-trauma to the head; recommend MRI of the brain for further evaluation.  #COPD stable continue current medication/inhalers.   #Prognosis/goals of care: I discussed with patient and son regarding-the difficult situation where patient continues to lose weight/feels poorly because of poor appetite/worsening fatigue-she is likely exacerbated by ongoing chemotherapy.  However the scan is fairly stable I think is reasonable to give a break for 1 month; and to start chemotherapy if she continues to improve.  # DISPOSITION: # HOLD chemo today; de-access # MRI Brain ASAP # follow up in 1 month MD- labs- Cbc/Cmp; Gemcitabine;  -Dr.B.  # I reviewed the blood work- with the patient in detail; also reviewed the imaging independently [as summarized above]; and with the patient in detail.       Orders Placed This Encounter  Procedures  . MR Brain W Wo Contrast    Standing Status:   Future    Standing Expiration Date:   04/04/2021    Order Specific Question:   ** REASON FOR EXAM (FREE TEXT)    Answer:   lung cancer; metatstatic; s/p fall    Order Specific Question:   If indicated for the ordered procedure, I authorize the administration of contrast media per Radiology protocol    Answer:   Yes    Order Specific Question:   What is the patient's sedation requirement?    Answer:   No Sedation    Order Specific Question:   Does the patient have a pacemaker or implanted devices?    Answer:   No    Order Specific Question:   Use SRS Protocol?    Answer:   No    Order Specific Question:   Radiology Contrast Protocol - do NOT remove file path    Answer:   [email or web address]    Order Specific Question:   Preferred imaging location?  Answer:   [facility] (table limit - 550lbs)  . CBC with Differential    Standing Status:   Future    Standing Expiration Date:   04/04/2021  . Comprehensive metabolic panel     Standing Status:   Future    Standing Expiration Date:   04/04/2021   All questions were answered. The patient knows to call the clinic with any problems, questions or concerns.      [name], MD 04/04/2020 10:15 AM

## 2020-04-04 NOTE — Assessment & Plan Note (Addendum)
#   Right upper lobe lung cancer-progressive/metastatic-stage IV;  Currently on single agent gemcitabine; June 20 21 CT scan-stable pulmonary nodules; stable-calcified/complex liver cysts.   #Hold gemcitabine cycle #4 day-1 today-given poor tolerance to therapy [see below]. Labs today reviewed;  Acceptable.-  #Weight loss/worsening fatigue/poor appetite-likely secondary to chemotherapy.  Hold chemotherapy as planned.   # Back pain-nonmalignant s/p evaluation with Dr.Chasnis; awaiting injection the back.  #S/p fall-trauma to the head; recommend MRI of the brain for further evaluation.  #COPD stable continue current medication/inhalers.   #Prognosis/goals of care: I discussed with patient and son regarding-the difficult situation where patient continues to lose weight/feels poorly because of poor appetite/worsening fatigue-she is likely exacerbated by ongoing chemotherapy.  However the scan is fairly stable I think is reasonable to give a break for 1 month; and to start chemotherapy if she continues to improve.  # DISPOSITION: # HOLD chemo today; de-access # MRI Brain ASAP # follow up in 1 month MD- labs- Cbc/Cmp; Gemcitabine;  -Dr.B.  # I reviewed the blood work- with the patient in detail; also reviewed the imaging independently [as summarized above]; and with the patient in detail.

## 2020-04-07 ENCOUNTER — Telehealth: Payer: Self-pay | Admitting: Internal Medicine

## 2020-04-07 ENCOUNTER — Ambulatory Visit
Admission: RE | Admit: 2020-04-07 | Discharge: 2020-04-07 | Disposition: A | Discharge status: Home / Self Care | Payer: Medicare Other | Source: Ambulatory Visit | Attending: Internal Medicine | Admitting: Internal Medicine

## 2020-04-07 ENCOUNTER — Other Ambulatory Visit: Payer: Self-pay

## 2020-04-07 DIAGNOSIS — Y929 Unspecified place or not applicable: Secondary | ICD-10-CM | POA: Diagnosis not present

## 2020-04-07 DIAGNOSIS — I6782 Cerebral ischemia: Secondary | ICD-10-CM | POA: Insufficient documentation

## 2020-04-07 DIAGNOSIS — C3411 Malignant neoplasm of upper lobe, right bronchus or lung: Secondary | ICD-10-CM | POA: Diagnosis not present

## 2020-04-07 DIAGNOSIS — Y939 Activity, unspecified: Secondary | ICD-10-CM | POA: Diagnosis not present

## 2020-04-07 DIAGNOSIS — W19XXXA Unspecified fall, initial encounter: Secondary | ICD-10-CM | POA: Insufficient documentation

## 2020-04-07 MED ORDER — GADOBUTROL 1 MMOL/ML IV SOLN
6.0000 mL | Freq: Once | INTRAVENOUS | Status: AC | PRN
  Administered 2020-04-07: 6 mL via INTRAVENOUS

## 2020-04-07 NOTE — Telephone Encounter (Signed)
On 6/14-I tried to reach patient's son; unable to connect.  H/T-please inform patient son that MRI of the brain negative for any cancer/or stroke.  Follow-up as planned; no new recommendations at this time.

## 2020-04-08 NOTE — Telephone Encounter (Signed)
Spoke with patient's son. Results provided to pt's son. He will communicate these results to the patient.

## 2020-04-10 ENCOUNTER — Other Ambulatory Visit: Payer: Self-pay | Admitting: *Deleted

## 2020-04-10 MED ORDER — OXYCODONE HCL 5 MG PO TABS: 5.0000 mg | ORAL_TABLET | Freq: Two times a day (BID) | ORAL | 0 refills | Status: DC | PRN

## 2020-05-02 ENCOUNTER — Encounter: Payer: Self-pay | Admitting: Internal Medicine

## 2020-05-02 ENCOUNTER — Inpatient Hospital Stay (HOSPITAL_BASED_OUTPATIENT_CLINIC_OR_DEPARTMENT_OTHER): Payer: Medicare Other | Admitting: Internal Medicine

## 2020-05-02 ENCOUNTER — Other Ambulatory Visit: Payer: Self-pay

## 2020-05-02 ENCOUNTER — Inpatient Hospital Stay: Payer: Medicare Other

## 2020-05-02 ENCOUNTER — Inpatient Hospital Stay: Payer: Medicare Other | Attending: Internal Medicine

## 2020-05-02 DIAGNOSIS — I10 Essential (primary) hypertension: Secondary | ICD-10-CM | POA: Diagnosis not present

## 2020-05-02 DIAGNOSIS — Z87891 Personal history of nicotine dependence: Secondary | ICD-10-CM | POA: Insufficient documentation

## 2020-05-02 DIAGNOSIS — C3411 Malignant neoplasm of upper lobe, right bronchus or lung: Secondary | ICD-10-CM | POA: Insufficient documentation

## 2020-05-02 DIAGNOSIS — Z95828 Presence of other vascular implants and grafts: Secondary | ICD-10-CM

## 2020-05-02 DIAGNOSIS — Z7952 Long term (current) use of systemic steroids: Secondary | ICD-10-CM | POA: Insufficient documentation

## 2020-05-02 DIAGNOSIS — J449 Chronic obstructive pulmonary disease, unspecified: Secondary | ICD-10-CM | POA: Insufficient documentation

## 2020-05-02 DIAGNOSIS — Z79899 Other long term (current) drug therapy: Secondary | ICD-10-CM | POA: Diagnosis not present

## 2020-05-02 LAB — COMPREHENSIVE METABOLIC PANEL
ALT: 21 U/L (ref 0–44)
AST: 18 U/L (ref 15–41)
Albumin: 3 g/dL — ABNORMAL LOW (ref 3.5–5.0)
Alkaline Phosphatase: 88 U/L (ref 38–126)
Anion gap: 11 (ref 5–15)
BUN: 27 mg/dL — ABNORMAL HIGH (ref 8–23)
CO2: 23 mmol/L (ref 22–32)
Calcium: 9.3 mg/dL (ref 8.9–10.3)
Chloride: 104 mmol/L (ref 98–111)
Creatinine, Ser: 1.09 mg/dL — ABNORMAL HIGH (ref 0.44–1.00)
GFR calc Af Amer: 55 mL/min — ABNORMAL LOW (ref >60)
GFR calc non Af Amer: 48 mL/min — ABNORMAL LOW (ref >60)
Glucose, Bld: 104 mg/dL — ABNORMAL HIGH (ref 70–99)
Potassium: 3.8 mmol/L (ref 3.5–5.1)
Sodium: 138 mmol/L (ref 135–145)
Total Bilirubin: 0.4 mg/dL (ref 0.3–1.2)
Total Protein: 8.4 g/dL — ABNORMAL HIGH (ref 6.5–8.1)

## 2020-05-02 LAB — CBC WITH DIFFERENTIAL/PLATELET
Abs Immature Granulocytes: 0.06 10*3/uL (ref 0.00–0.07)
Basophils Absolute: 0 10*3/uL (ref 0.0–0.1)
Basophils Relative: 0 %
Eosinophils Absolute: 0.5 10*3/uL (ref 0.0–0.5)
Eosinophils Relative: 4 %
HCT: 30.9 % — ABNORMAL LOW (ref 36.0–46.0)
Hemoglobin: 10 g/dL — ABNORMAL LOW (ref 12.0–15.0)
Immature Granulocytes: 1 %
Lymphocytes Relative: 6 %
Lymphs Abs: 0.7 10*3/uL (ref 0.7–4.0)
MCH: 30.1 pg (ref 26.0–34.0)
MCHC: 32.4 g/dL (ref 30.0–36.0)
MCV: 93.1 fL (ref 80.0–100.0)
Monocytes Absolute: 1 10*3/uL (ref 0.1–1.0)
Monocytes Relative: 9 %
Neutro Abs: 9.3 10*3/uL — ABNORMAL HIGH (ref 1.7–7.7)
Neutrophils Relative %: 80 %
Platelets: 390 10*3/uL (ref 150–400)
RBC: 3.32 MIL/uL — ABNORMAL LOW (ref 3.87–5.11)
RDW: 15.9 % — ABNORMAL HIGH (ref 11.5–15.5)
WBC: 11.6 10*3/uL — ABNORMAL HIGH (ref 4.0–10.5)
nRBC: 0 % (ref 0.0–0.2)

## 2020-05-02 MED ORDER — HEPARIN SOD (PORK) LOCK FLUSH 100 UNIT/ML IV SOLN
INTRAVENOUS | Status: AC
  Filled 2020-05-02: qty 5

## 2020-05-02 MED ORDER — PREDNISONE 20 MG PO TABS
20.0000 mg | ORAL_TABLET | Freq: Every day | ORAL | 0 refills | Status: DC
Start: 2020-05-02 — End: 2020-05-16

## 2020-05-02 MED ORDER — SODIUM CHLORIDE 0.9% FLUSH
10.0000 mL | INTRAVENOUS | Status: AC | PRN
  Administered 2020-05-02: 10 mL via INTRAVENOUS
  Filled 2020-05-02: qty 10

## 2020-05-02 MED ORDER — HEPARIN SOD (PORK) LOCK FLUSH 100 UNIT/ML IV SOLN
500.0000 [IU] | Freq: Once | INTRAVENOUS | Status: AC
  Administered 2020-05-02: 500 [IU]
  Filled 2020-05-02: qty 5

## 2020-05-02 MED ORDER — OXYCODONE HCL 5 MG PO TABS: 5.0000 mg | ORAL_TABLET | Freq: Two times a day (BID) | ORAL | 0 refills | Status: DC | PRN

## 2020-05-02 NOTE — Patient Instructions (Signed)
#   START PREDNISONE 20 mg once a day [sent script to pharmacy ]

## 2020-05-02 NOTE — Progress Notes (Signed)
[place] OFFICE PROGRESS NOTE  Patient Care Team: Leonel Ramsay, MD as PCP - General (Infectious Diseases) Telford Nab, RN as Registered Nurse [name], MD as Medical Oncologist (Medical Oncology) [name], MD as Referring Physician (Physical Medicine and Rehabilitation)  Cancer Staging No matching staging information was found for the patient.   Oncology History Overview Note  # April 2019- RUL LUNG CANCER-non-small cell; favor squamous cell; PET scan T4N0 [right upper lobe mass involving[mediastinal/blood results] vs small pleural effusion [?M1]- CARBO-TAXOL- RT;   #October 2020-CT progressive subcentimeter right-sided lung lesions;  # NOV 17th 2020- KEYTRUDA q3 W  # 24th FEB 2021- Carbo-Taxol weekly; # 5 treatments.  March 30-2021 progressive disease liver lesion; stable lung.  #February 01, 2020-gemcitabine [800 mg/m]  # Hx of Right breast ca [s/p lumpec; RT; No chemo; "pill" x5 years]  # COPD/Hx of smoking  # PALLIATIVE CARE: 12/19/2019- DELCINED  MOLECULAR TESTING/OMNISEQ: PDL-1 25% [TPS; TMB-H; No targets**]  --------------------------------------------------    DIAGNOSIS: [ April 2019]SQUAMOUS CELL LUNG CA  STAGE: IV/Recurrent ;GOALS: PALLIATIVE  CURRENT/MOST RECENT THERAPY-  gemcitabine [C] .     Primary cancer of right upper lobe of lung ([place])  03/13/2018 - 05/15/2018 Chemotherapy   The patient had palonosetron (ALOXI) injection 0.25 mg, 0.25 mg, Intravenous,  Once, 9 of 9 cycles Administration: 0.25 mg (03/13/2018), 0.25 mg (04/10/2018), 0.25 mg (04/17/2018), 0.25 mg (03/21/2018), 0.25 mg (04/25/2018), 0.25 mg (03/27/2018), 0.25 mg (04/03/2018), 0.25 mg (05/01/2018), 0.25 mg (05/08/2018) CARBOplatin (PARAPLATIN) 130 mg in sodium chloride 0.9 % 250 mL chemo infusion, 130 mg (100 % of original dose 131.4 mg), Intravenous,  Once, 9 of 9 cycles Dose modification:   (original dose 131.4 mg, Cycle 1) Administration: 130 mg  (03/13/2018), 130 mg (04/10/2018), 130 mg (04/17/2018), 130 mg (03/21/2018), 130 mg (04/25/2018), 130 mg (03/27/2018), 130 mg (04/03/2018), 130 mg (05/01/2018), 130 mg (05/08/2018) PACLitaxel (TAXOL) 78 mg in sodium chloride 0.9 % 250 mL chemo infusion (</= 28m/m2), 45 mg/m2 = 78 mg, Intravenous,  Once, 9 of 9 cycles Administration: 78 mg (03/13/2018), 78 mg (04/10/2018), 78 mg (04/17/2018), 78 mg (03/21/2018), 78 mg (04/25/2018), 78 mg (03/27/2018), 78 mg (04/03/2018), 78 mg (05/01/2018), 78 mg (05/08/2018)  for chemotherapy treatment.    09/12/2019 - 12/04/2019 Chemotherapy   The patient had pembrolizumab (KEYTRUDA) 200 mg in sodium chloride 0.9 % 50 mL chemo infusion, 200 mg, Intravenous, Once, 4 of 6 cycles Administration: 200 mg (09/12/2019), 200 mg (10/03/2019), 200 mg (10/24/2019), 200 mg (11/14/2019)  for chemotherapy treatment.    12/19/2019 - 01/24/2020 Chemotherapy   The patient had palonosetron (ALOXI) injection 0.25 mg, 0.25 mg, Intravenous,  Once, 5 of 8 cycles Administration: 0.25 mg (12/19/2019), 0.25 mg (12/28/2019), 0.25 mg (01/18/2020), 0.25 mg (01/04/2020), 0.25 mg (01/11/2020) CARBOplatin (PARAPLATIN) 120 mg in sodium chloride 0.9 % 100 mL chemo infusion, 120 mg (100 % of original dose 122 mg), Intravenous,  Once, 5 of 8 cycles Dose modification:   (original dose 122 mg, Cycle 1) Administration: 120 mg (12/19/2019), 120 mg (12/28/2019), 120 mg (01/18/2020), 120 mg (01/04/2020), 120 mg (01/11/2020) PACLitaxel (TAXOL) 138 mg in sodium chloride 0.9 % 250 mL chemo infusion (</= 875mm2), 80 mg/m2 = 138 mg, Intravenous,  Once, 5 of 8 cycles Administration: 138 mg (12/19/2019), 138 mg (12/28/2019), 138 mg (01/18/2020), 138 mg (01/04/2020), 138 mg (01/11/2020)  for chemotherapy treatment.    02/01/2020 -  Chemotherapy   The patient had gemcitabine (GEMZAR) 1,400 mg in sodium chloride 0.9 %  250 mL chemo infusion, 1,368 mg (100 % of original dose 800 mg/m2), Intravenous,  Once, 3 of 4 cycles Dose modification: 800 mg/m2 (original  dose 800 mg/m2, Cycle 1, Reason: Provider Judgment) Administration: 1,400 mg (02/01/2020), 1,400 mg (02/08/2020), 1,400 mg (02/22/2020), 1,400 mg (02/29/2020), 1,400 mg (03/21/2020), 1,400 mg (03/14/2020)  for chemotherapy treatment.        INTERVAL HISTORY: Patient a poor historian.  Brenda Parker 84 y.o.  female patient with recurrent/metastatic squamous cell lung cancer-currently on  gemcitabine chemotherapy is here for follow-up.  As per the son patient continues to eat poorly.  Appetite is poor.  No nausea no vomiting no headaches.  No significant improvement after steroid injections to the back.  States that she is taking oxycodone up to 10 pills a week.   Review of Systems  Constitutional: Positive for malaise/fatigue. Negative for chills, diaphoresis and fever.  HENT: Negative for nosebleeds and sore throat.   Eyes: Negative for double vision.  Respiratory: Positive for cough and shortness of breath. Negative for hemoptysis, sputum production and wheezing.   Cardiovascular: Negative for chest pain, palpitations and orthopnea.  Gastrointestinal: Negative for abdominal pain, blood in stool, heartburn, melena, nausea and vomiting.  Genitourinary: Negative for dysuria, frequency and urgency.  Musculoskeletal: Positive for back pain and joint pain.  Skin: Negative.  Negative for itching and rash.  Neurological: Negative for tingling, focal weakness, weakness and headaches.  Endo/Heme/Allergies: Does not bruise/bleed easily.  Psychiatric/Behavioral: Negative for depression. The patient is not nervous/anxious and does not have insomnia.     PAST MEDICAL HISTORY :  Past Medical History:  Diagnosis Date  . Benign liver cyst   . Breast cancer ([place]) 2005   right breast  . COPD (chronic obstructive pulmonary disease) ([place])   . Dyspnea   . Glaucoma   . Hemoptysis 2019  . Hypertension    Not on medication for htn. patient denies this  . Lung mass 01/2018  . Personal history of radiation  therapy     PAST SURGICAL HISTORY :   Past Surgical History:  Procedure Laterality Date  . BREAST EXCISIONAL BIOPSY Right 2005   positive, radiation  . BREAST LUMPECTOMY Right 03/2004  . CHOLECYSTECTOMY  1988  . COLONOSCOPY  2012  . ENDOBRONCHIAL ULTRASOUND N/A 02/21/2018   Procedure: ENDOBRONCHIAL ULTRASOUND;  Surgeon: Laverle Hobby, MD;  Location: [facility] ORS;  Service: Pulmonary;  Laterality: N/A;  . EYE SURGERY Bilateral 2009,2010   cataract extractions with lens implant  . GALLBLADDER SURGERY Bilateral   . IR FLUORO GUIDE PORT INSERTION RIGHT  03/10/2018  . JOINT REPLACEMENT Left 2008   left hip replacement  . PORTACATH PLACEMENT  03/10/2018  . TONSILLECTOMY  1960    FAMILY HISTORY :   Family History  Problem Relation Age of Onset  . Breast cancer Mother 11  . CAD Father     SOCIAL HISTORY:   Social History   Tobacco Use  . Smoking status: Former Smoker    Packs/day: 1.00    Types: Cigarettes    Quit date: 10/25/2005    Years since quitting: 14.5  . Smokeless tobacco: Never Used  Vaping Use  . Vaping Use: Never used  Substance Use Topics  . Alcohol use: Not Currently    Comment: occasional glass of beer  . Drug use: Never    ALLERGIES:  is allergic to penicillins.  MEDICATIONS:  Current Outpatient Medications  Medication Sig Dispense Refill  . albuterol (PROVENTIL HFA) 108 (90 Base)  MCG/ACT inhaler Inhale 2 puffs into the lungs every 6 (six) hours as needed for wheezing or shortness of breath.     . Cholecalciferol (VITAMIN D3) 1000 units CAPS Take 3,000 Units by mouth daily.     Marland Kitchen latanoprost (XALATAN) 0.005 % ophthalmic solution Apply 1 drop to eye daily.    Marland Kitchen oxyCODONE (OXY IR/ROXICODONE) 5 MG immediate release tablet Take 1 tablet (5 mg total) by mouth 2 (two) times daily as needed for moderate pain or severe pain. 45 tablet 0  . TRELEGY ELLIPTA 100-62.5-25 MCG/INH AEPB     . ondansetron (ZOFRAN) 8 MG tablet One pill every 8 hours as needed for  nausea/vomitting. (Patient not taking: Reported on 04/04/2020) 40 tablet 1  . predniSONE (DELTASONE) 20 MG tablet Take 1 tablet (20 mg total) by mouth daily with breakfast. 14 tablet 0   No current facility-administered medications for this visit.   Facility-Administered Medications Ordered in Other Visits  Medication Dose Route Frequency Provider Last Rate Last Admin  . heparin lock flush 100 unit/mL  500 Units Intracatheter Once Charlaine Dalton R, MD      . sodium chloride flush (NS) 0.9 % injection 10 mL  10 mL Intravenous PRN [name], MD   10 mL at 05/02/20 0905    PHYSICAL EXAMINATION: ECOG PERFORMANCE STATUS: 1 - Symptomatic but completely ambulatory  BP (!) 149/77 (BP Location: Left Arm, Patient Position: Sitting, Cuff Size: Normal)   Pulse 95   Temp (!) 97.3 F (36.3 C) (Tympanic)   Wt 132 lb 6 oz (60 kg)   BMI 22.72 kg/m   Filed Weights   05/02/20 0911  Weight: 132 lb 6 oz (60 kg)    Physical Exam Constitutional:      Comments: [place] Caucasian female patient..  She is walking herself.  Accompanied by her son.  HENT:     Head: Normocephalic and atraumatic.     Mouth/Throat:     Pharynx: No oropharyngeal exudate.  Eyes:     Pupils: Pupils are equal, round, and reactive to light.  Cardiovascular:     Rate and Rhythm: Normal rate and regular rhythm.  Pulmonary:     Effort: No respiratory distress.     Breath sounds: No wheezing.  Abdominal:     General: Bowel sounds are normal. There is no distension.     Palpations: Abdomen is soft. There is no mass.     Tenderness: There is no abdominal tenderness. There is no guarding or rebound.  Musculoskeletal:        General: No tenderness. Normal range of motion.     Cervical back: Normal range of motion and neck supple.  Skin:    General: Skin is warm.  Neurological:     Mental Status: She is alert and oriented to person, place, and time.  Psychiatric:        Mood and Affect: Affect normal.      LABORATORY DATA:  I have reviewed the data as listed    Component Value Date/Time   NA 138 05/02/2020 0859   K 3.8 05/02/2020 0859   CL 104 05/02/2020 0859   CO2 23 05/02/2020 0859   GLUCOSE 104 (H) 05/02/2020 0859   BUN 27 (H) 05/02/2020 0859   CREATININE 1.09 (H) 05/02/2020 0859   CALCIUM 9.3 05/02/2020 0859   PROT 8.4 (H) 05/02/2020 0859   ALBUMIN 3.0 (L) 05/02/2020 0859   AST 18 05/02/2020 0859   ALT 21 05/02/2020 0859  ALKPHOS 88 05/02/2020 0859   BILITOT 0.4 05/02/2020 0859   GFRNONAA 48 (L) 05/02/2020 0859   GFRAA 55 (L) 05/02/2020 0859    No results found for: SPEP, UPEP  Lab Results  Component Value Date   WBC 11.6 (H) 05/02/2020   NEUTROABS 9.3 (H) 05/02/2020   HGB 10.0 (L) 05/02/2020   HCT 30.9 (L) 05/02/2020   MCV 93.1 05/02/2020   PLT 390 05/02/2020      Chemistry      Component Value Date/Time   NA 138 05/02/2020 0859   K 3.8 05/02/2020 0859   CL 104 05/02/2020 0859   CO2 23 05/02/2020 0859   BUN 27 (H) 05/02/2020 0859   CREATININE 1.09 (H) 05/02/2020 0859      Component Value Date/Time   CALCIUM 9.3 05/02/2020 0859   ALKPHOS 88 05/02/2020 0859   AST 18 05/02/2020 0859   ALT 21 05/02/2020 0859   BILITOT 0.4 05/02/2020 0859       RADIOGRAPHIC STUDIES: I have personally reviewed the radiological images as listed and agreed with the findings in the report. No results found.   ASSESSMENT & PLAN:  Primary cancer of right upper lobe of lung ([place]) # Right upper lobe lung cancer-progressive/metastatic-stage IV;  Currently on single agent gemcitabine; June 20 21 CT scan-stable pulmonary nodules; stable-calcified/complex liver cysts.   #Continue to hold gemcitabine cycle #4 day-1 today-given poor tolerance to therapy [see below]. Labs today reviewed;  Acceptable-no significant improvement noted in performance status/weight loss.  #Weight loss/worsening fatigue/poor appetite-likely secondary to chemotherapy.  Hold chemotherapy as planned.   Recommend evaluation with [facility].  Continue protein shakes.  Add prednisone.  # Back pain-nonmalignant s/p evaluation with Dr.Chasnis; s/p injection the back.  Stable.  Add prednisone.  #COPD stable continue current medication/inhalers.   I spoke at length with the patient's son, Brenda Parker- regarding the patient's clinical status/plan of care.  Family agreement.    # DISPOSITION: # referral to [facility] # HOLD chemo today; de-access # follow up in 2 weeks; MD- labs- Cbc/Cmp; Gemcitabine;  -Dr.B.       No orders of the defined types were placed in this encounter.  All questions were answered. The patient knows to call the clinic with any problems, questions or concerns.      [name], MD 05/02/2020 9:42 AM

## 2020-05-02 NOTE — Assessment & Plan Note (Addendum)
#   Right upper lobe lung cancer-progressive/metastatic-stage IV;  Currently on single agent gemcitabine; June 20 21 CT scan-stable pulmonary nodules; stable-calcified/complex liver cysts.   #Continue to hold gemcitabine cycle #4 day-1 today-given poor tolerance to therapy [see below]. Labs today reviewed;  Acceptable-no significant improvement noted in performance status/weight loss.  #Weight loss/worsening fatigue/poor appetite-likely secondary to chemotherapy.  Hold chemotherapy as planned.  Recommend evaluation with [facility].  Continue protein shakes.  Add prednisone.  # Back pain-nonmalignant s/p evaluation with Dr.Chasnis; s/p injection the back.  Stable.  Add prednisone.  #COPD stable continue current medication/inhalers.   I spoke at length with the patient's son, [name]- regarding the patient's clinical status/plan of care.  Family agreement.    # DISPOSITION: # referral to [facility] # HOLD chemo today; de-access # follow up in 2 weeks; MD- labs- Cbc/Cmp; Gemcitabine;  -Dr.B.

## 2020-05-12 ENCOUNTER — Other Ambulatory Visit: Payer: Self-pay | Admitting: Physical Medicine and Rehabilitation

## 2020-05-12 ENCOUNTER — Telehealth: Payer: Self-pay

## 2020-05-12 DIAGNOSIS — M5414 Radiculopathy, thoracic region: Secondary | ICD-10-CM

## 2020-05-12 NOTE — Telephone Encounter (Signed)
Nutrition  Patient identified on Malnutrition Screening report for weight loss and poor appetite.   Chart reviewed.   Called patient but no answer. Left message on voicemail with call back number.  Luv Mish B. Zenia Resides, [place], [place] Registered Dietitian [phone] (pager)

## 2020-05-12 NOTE — Telephone Encounter (Signed)
Nutrition  Patient identified on Malnutrition Screening report for poor appetite and weight loss.   Patient returned RD's call.  Reports that predinsone has increased appetite some.  Reports that she is not restricting foods and eating anything she can eat.  Declined nutrition services at this time.  Has RD contact information and can reach out to RD if needed.   Min Tunnell B. Zenia Resides, [place], [place] Registered Dietitian [phone] (pager)

## 2020-05-16 ENCOUNTER — Inpatient Hospital Stay: Payer: Medicare Other

## 2020-05-16 ENCOUNTER — Inpatient Hospital Stay (HOSPITAL_BASED_OUTPATIENT_CLINIC_OR_DEPARTMENT_OTHER): Payer: Medicare Other | Admitting: Internal Medicine

## 2020-05-16 ENCOUNTER — Encounter: Payer: Self-pay | Admitting: Internal Medicine

## 2020-05-16 ENCOUNTER — Other Ambulatory Visit: Payer: Self-pay

## 2020-05-16 DIAGNOSIS — C3411 Malignant neoplasm of upper lobe, right bronchus or lung: Secondary | ICD-10-CM

## 2020-05-16 DIAGNOSIS — Z95828 Presence of other vascular implants and grafts: Secondary | ICD-10-CM

## 2020-05-16 LAB — CBC WITH DIFFERENTIAL/PLATELET
Abs Immature Granulocytes: 0.08 10*3/uL — ABNORMAL HIGH (ref 0.00–0.07)
Basophils Absolute: 0 10*3/uL (ref 0.0–0.1)
Basophils Relative: 0 %
Eosinophils Absolute: 0.2 10*3/uL (ref 0.0–0.5)
Eosinophils Relative: 1 %
HCT: 33.8 % — ABNORMAL LOW (ref 36.0–46.0)
Hemoglobin: 10.6 g/dL — ABNORMAL LOW (ref 12.0–15.0)
Immature Granulocytes: 1 %
Lymphocytes Relative: 7 %
Lymphs Abs: 1 10*3/uL (ref 0.7–4.0)
MCH: 29.9 pg (ref 26.0–34.0)
MCHC: 31.4 g/dL (ref 30.0–36.0)
MCV: 95.2 fL (ref 80.0–100.0)
Monocytes Absolute: 0.8 10*3/uL (ref 0.1–1.0)
Monocytes Relative: 6 %
Neutro Abs: 11.3 10*3/uL — ABNORMAL HIGH (ref 1.7–7.7)
Neutrophils Relative %: 85 %
Platelets: 307 10*3/uL (ref 150–400)
RBC: 3.55 MIL/uL — ABNORMAL LOW (ref 3.87–5.11)
RDW: 16.6 % — ABNORMAL HIGH (ref 11.5–15.5)
WBC: 13.3 10*3/uL — ABNORMAL HIGH (ref 4.0–10.5)
nRBC: 0 % (ref 0.0–0.2)

## 2020-05-16 LAB — COMPREHENSIVE METABOLIC PANEL
ALT: 40 U/L (ref 0–44)
AST: 24 U/L (ref 15–41)
Albumin: 3.1 g/dL — ABNORMAL LOW (ref 3.5–5.0)
Alkaline Phosphatase: 80 U/L (ref 38–126)
Anion gap: 8 (ref 5–15)
BUN: 24 mg/dL — ABNORMAL HIGH (ref 8–23)
CO2: 25 mmol/L (ref 22–32)
Calcium: 8.6 mg/dL — ABNORMAL LOW (ref 8.9–10.3)
Chloride: 105 mmol/L (ref 98–111)
Creatinine, Ser: 1 mg/dL (ref 0.44–1.00)
GFR calc Af Amer: >60 mL/min (ref >60)
GFR calc non Af Amer: 52 mL/min — ABNORMAL LOW (ref >60)
Glucose, Bld: 95 mg/dL (ref 70–99)
Potassium: 3.7 mmol/L (ref 3.5–5.1)
Sodium: 138 mmol/L (ref 135–145)
Total Bilirubin: 0.5 mg/dL (ref 0.3–1.2)
Total Protein: 7.6 g/dL (ref 6.5–8.1)

## 2020-05-16 MED ORDER — HEPARIN SOD (PORK) LOCK FLUSH 100 UNIT/ML IV SOLN
500.0000 [IU] | Freq: Once | INTRAVENOUS | Status: AC
  Administered 2020-05-16: 500 [IU]
  Filled 2020-05-16: qty 5

## 2020-05-16 MED ORDER — PREDNISONE 10 MG PO TABS
10.0000 mg | ORAL_TABLET | Freq: Every day | ORAL | 0 refills | Status: DC
Start: 2020-05-16 — End: 2020-07-23

## 2020-05-16 MED ORDER — SODIUM CHLORIDE 0.9% FLUSH
10.0000 mL | INTRAVENOUS | Status: DC | PRN
  Administered 2020-05-16: 10 mL via INTRAVENOUS
  Filled 2020-05-16: qty 10

## 2020-05-16 MED ORDER — HEPARIN SOD (PORK) LOCK FLUSH 100 UNIT/ML IV SOLN
INTRAVENOUS | Status: AC
  Filled 2020-05-16: qty 5

## 2020-05-16 NOTE — Assessment & Plan Note (Addendum)
#   Right upper lobe lung cancer-progressive/metastatic-stage IV;  Currently on single agent gemcitabine; June 6th 20 21 CT scan-stable pulmonary nodules; stable-calcified/complex liver cysts.  Stable.  # Continue to hold gemcitabine cycle #4 day-1 today-given poor tolerance to therapy [weight loss/ fatigue].  Recommend chemo holiday-because of poor tolerance to therapy.  I would recommend repeating the scan in the next 6 weeks.  # Weight loss/worsening fatigue/poor appetite-likely secondary to chemotherapy- improved on Prednsione.  Recommend prednisone 10 mg a day for the next 1 month.  Hold chemotherapy as planned.  # Back pain-nonmalignant s/p evaluation with Dr.Chasnis; s/p injection the back.  Stable.  #COPD -stable.  Continue current medication/inhalers.   I spoke at length with the patient's son, [name]- regarding the patient's clinical status/plan of care.  Family agreement.   # DISPOSITION: # HOLD chemo today; de-access # follow up in 6 weeks; MD- labs- Cbc/Cmp; Gemcitabine; CT chest prior -Dr.B.

## 2020-05-16 NOTE — Progress Notes (Signed)
[name] OFFICE PROGRESS NOTE  Patient Care Team: Leonel Ramsay, MD as PCP - General (Infectious Diseases) Telford Nab, RN as Registered Nurse [name], MD as Medical Oncologist (Medical Oncology) [name], MD as Referring Physician (Physical Medicine and Rehabilitation)  Cancer Staging No matching staging information was found for the patient.   Oncology History Overview Note  # April 2019- RUL LUNG CANCER-non-small cell; favor squamous cell; PET scan T4N0 [right upper lobe mass involving[mediastinal/blood results] vs small pleural effusion [?M1]- CARBO-TAXOL- RT;   #October 2020-CT progressive subcentimeter right-sided lung lesions;  # NOV 17th 2020- KEYTRUDA q3 W  # 24th FEB 2021- Carbo-Taxol weekly; # 5 treatments.  March 30-2021 progressive disease liver lesion; stable lung.  #February 01, 2020-gemcitabine [800 mg/m]  # Hx of Right breast ca [s/p lumpec; RT; No chemo; "pill" x5 years]  # COPD/Hx of smoking  # PALLIATIVE CARE: 12/19/2019- DELCINED  MOLECULAR TESTING/OMNISEQ: PDL-1 25% [TPS; TMB-H; No targets**]  --------------------------------------------------    DIAGNOSIS: [ April 2019]SQUAMOUS CELL LUNG CA  STAGE: IV/Recurrent ;GOALS: PALLIATIVE  CURRENT/MOST RECENT THERAPY-  gemcitabine [C] .     Primary cancer of right upper lobe of lung ([place])  03/13/2018 - 05/15/2018 Chemotherapy   The patient had palonosetron (ALOXI) injection 0.25 mg, 0.25 mg, Intravenous,  Once, 9 of 9 cycles Administration: 0.25 mg (03/13/2018), 0.25 mg (04/10/2018), 0.25 mg (04/17/2018), 0.25 mg (03/21/2018), 0.25 mg (04/25/2018), 0.25 mg (03/27/2018), 0.25 mg (04/03/2018), 0.25 mg (05/01/2018), 0.25 mg (05/08/2018) CARBOplatin (PARAPLATIN) 130 mg in sodium chloride 0.9 % 250 mL chemo infusion, 130 mg (100 % of original dose 131.4 mg), Intravenous,  Once, 9 of 9 cycles Dose modification:   (original dose 131.4 mg, Cycle 1) Administration: 130 mg  (03/13/2018), 130 mg (04/10/2018), 130 mg (04/17/2018), 130 mg (03/21/2018), 130 mg (04/25/2018), 130 mg (03/27/2018), 130 mg (04/03/2018), 130 mg (05/01/2018), 130 mg (05/08/2018) PACLitaxel (TAXOL) 78 mg in sodium chloride 0.9 % 250 mL chemo infusion (</= 82m/m2), 45 mg/m2 = 78 mg, Intravenous,  Once, 9 of 9 cycles Administration: 78 mg (03/13/2018), 78 mg (04/10/2018), 78 mg (04/17/2018), 78 mg (03/21/2018), 78 mg (04/25/2018), 78 mg (03/27/2018), 78 mg (04/03/2018), 78 mg (05/01/2018), 78 mg (05/08/2018)  for chemotherapy treatment.    09/12/2019 - 12/04/2019 Chemotherapy   The patient had pembrolizumab (KEYTRUDA) 200 mg in sodium chloride 0.9 % 50 mL chemo infusion, 200 mg, Intravenous, Once, 4 of 6 cycles Administration: 200 mg (09/12/2019), 200 mg (10/03/2019), 200 mg (10/24/2019), 200 mg (11/14/2019)  for chemotherapy treatment.    12/19/2019 - 01/24/2020 Chemotherapy   The patient had palonosetron (ALOXI) injection 0.25 mg, 0.25 mg, Intravenous,  Once, 5 of 8 cycles Administration: 0.25 mg (12/19/2019), 0.25 mg (12/28/2019), 0.25 mg (01/18/2020), 0.25 mg (01/04/2020), 0.25 mg (01/11/2020) CARBOplatin (PARAPLATIN) 120 mg in sodium chloride 0.9 % 100 mL chemo infusion, 120 mg (100 % of original dose 122 mg), Intravenous,  Once, 5 of 8 cycles Dose modification:   (original dose 122 mg, Cycle 1) Administration: 120 mg (12/19/2019), 120 mg (12/28/2019), 120 mg (01/18/2020), 120 mg (01/04/2020), 120 mg (01/11/2020) PACLitaxel (TAXOL) 138 mg in sodium chloride 0.9 % 250 mL chemo infusion (</= 884mm2), 80 mg/m2 = 138 mg, Intravenous,  Once, 5 of 8 cycles Administration: 138 mg (12/19/2019), 138 mg (12/28/2019), 138 mg (01/18/2020), 138 mg (01/04/2020), 138 mg (01/11/2020)  for chemotherapy treatment.    02/01/2020 -  Chemotherapy   The patient had gemcitabine (GEMZAR) 1,400 mg in sodium chloride 0.9 %  250 mL chemo infusion, 1,368 mg (100 % of original dose 800 mg/m2), Intravenous,  Once, 3 of 4 cycles Dose modification: 800 mg/m2 (original  dose 800 mg/m2, Cycle 1, Reason: Provider Judgment) Administration: 1,400 mg (02/01/2020), 1,400 mg (02/08/2020), 1,400 mg (02/22/2020), 1,400 mg (02/29/2020), 1,400 mg (03/21/2020), 1,400 mg (03/14/2020)  for chemotherapy treatment.        INTERVAL HISTORY: Patient a poor historian.  Brenda Parker 84 y.o.  female patient with recurrent/metastatic squamous cell lung cancer-currently on  gemcitabine chemotherapy is here for follow-up.  Chemotherapy was held 2 weeks ago-because overall poor tolerance to therapy.  As per son patient has been eating better since being on prednisone.  Continues to chronic back pain without any significant improvement post injection.  Awaiting MRI.  Otherwise no nausea no vomiting.  No headaches.  Chronic fatigue.  Review of Systems  Constitutional: Positive for malaise/fatigue. Negative for chills, diaphoresis and fever.  HENT: Negative for nosebleeds and sore throat.   Eyes: Negative for double vision.  Respiratory: Positive for cough and shortness of breath. Negative for hemoptysis, sputum production and wheezing.   Cardiovascular: Negative for chest pain, palpitations and orthopnea.  Gastrointestinal: Negative for abdominal pain, blood in stool, heartburn, melena, nausea and vomiting.  Genitourinary: Negative for dysuria, frequency and urgency.  Musculoskeletal: Positive for back pain and joint pain.  Skin: Negative.  Negative for itching and rash.  Neurological: Negative for tingling, focal weakness, weakness and headaches.  Endo/Heme/Allergies: Does not bruise/bleed easily.  Psychiatric/Behavioral: Negative for depression. The patient is not nervous/anxious and does not have insomnia.     PAST MEDICAL HISTORY :  Past Medical History:  Diagnosis Date  . Benign liver cyst   . Breast cancer ([place]) 2005   right breast  . COPD (chronic obstructive pulmonary disease) ([place])   . Dyspnea   . Glaucoma   . Hemoptysis 2019  . Hypertension    Not on medication  for htn. patient denies this  . Lung mass 01/2018  . Personal history of radiation therapy     PAST SURGICAL HISTORY :   Past Surgical History:  Procedure Laterality Date  . BREAST EXCISIONAL BIOPSY Right 2005   positive, radiation  . BREAST LUMPECTOMY Right 03/2004  . CHOLECYSTECTOMY  1988  . COLONOSCOPY  2012  . ENDOBRONCHIAL ULTRASOUND N/A 02/21/2018   Procedure: ENDOBRONCHIAL ULTRASOUND;  Surgeon: Laverle Hobby, MD;  Location: [facility] ORS;  Service: Pulmonary;  Laterality: N/A;  . EYE SURGERY Bilateral 2009,2010   cataract extractions with lens implant  . GALLBLADDER SURGERY Bilateral   . IR FLUORO GUIDE PORT INSERTION RIGHT  03/10/2018  . JOINT REPLACEMENT Left 2008   left hip replacement  . PORTACATH PLACEMENT  03/10/2018  . TONSILLECTOMY  1960    FAMILY HISTORY :   Family History  Problem Relation Age of Onset  . Breast cancer Mother 1  . CAD Father     SOCIAL HISTORY:   Social History   Tobacco Use  . Smoking status: Former Smoker    Packs/day: 1.00    Types: Cigarettes    Quit date: 10/25/2005    Years since quitting: 14.5  . Smokeless tobacco: Never Used  Vaping Use  . Vaping Use: Never used  Substance Use Topics  . Alcohol use: Not Currently    Comment: occasional glass of beer  . Drug use: Never    ALLERGIES:  is allergic to penicillins.  MEDICATIONS:  Current Outpatient Medications  Medication Sig Dispense Refill  .  albuterol (PROVENTIL HFA) 108 (90 Base) MCG/ACT inhaler Inhale 2 puffs into the lungs every 6 (six) hours as needed for wheezing or shortness of breath.     . Cholecalciferol (VITAMIN D3) 1000 units CAPS Take 3,000 Units by mouth daily.     Marland Kitchen latanoprost (XALATAN) 0.005 % ophthalmic solution Apply 1 drop to eye daily.    Marland Kitchen oxyCODONE (OXY IR/ROXICODONE) 5 MG immediate release tablet Take 1 tablet (5 mg total) by mouth 2 (two) times daily as needed for moderate pain or severe pain. 45 tablet 0  . predniSONE (DELTASONE) 10 MG tablet  Take 1 tablet (10 mg total) by mouth daily with breakfast. 30 tablet 0  . TRELEGY ELLIPTA 100-62.5-25 MCG/INH AEPB     . ondansetron (ZOFRAN) 8 MG tablet One pill every 8 hours as needed for nausea/vomitting. (Patient not taking: Reported on 04/04/2020) 40 tablet 1   No current facility-administered medications for this visit.   Facility-Administered Medications Ordered in Other Visits  Medication Dose Route Frequency Provider Last Rate Last Admin  . sodium chloride flush (NS) 0.9 % injection 10 mL  10 mL Intravenous PRN [name], MD   10 mL at 05/02/20 0905    PHYSICAL EXAMINATION: ECOG PERFORMANCE STATUS: 1 - Symptomatic but completely ambulatory  BP (!) 174/95 (BP Location: Right Arm, Patient Position: Sitting, Cuff Size: Normal)   Pulse 77   Temp (!) 97.1 F (36.2 C) (Tympanic)   Resp 16   Ht 5' 4"  (1.626 m)   Wt 139 lb (63 kg)   SpO2 99%   BMI 23.86 kg/m   Filed Weights   05/15/20 1411 05/16/20 0907  Weight: 139 lb 14.4 oz (63.5 kg) 139 lb (63 kg)    Physical Exam Constitutional:      Comments: [place] Caucasian female patient..  She is walking herself.  Accompanied by her son.  HENT:     Head: Normocephalic and atraumatic.     Mouth/Throat:     Pharynx: No oropharyngeal exudate.  Eyes:     Pupils: Pupils are equal, round, and reactive to light.  Cardiovascular:     Rate and Rhythm: Normal rate and regular rhythm.  Pulmonary:     Effort: No respiratory distress.     Breath sounds: No wheezing.  Abdominal:     General: Bowel sounds are normal. There is no distension.     Palpations: Abdomen is soft. There is no mass.     Tenderness: There is no abdominal tenderness. There is no guarding or rebound.  Musculoskeletal:        General: No tenderness. Normal range of motion.     Cervical back: Normal range of motion and neck supple.  Skin:    General: Skin is warm.  Neurological:     Mental Status: She is alert and oriented to person, place,  and time.  Psychiatric:        Mood and Affect: Affect normal.     LABORATORY DATA:  I have reviewed the data as listed    Component Value Date/Time   NA 138 05/16/2020 0846   K 3.7 05/16/2020 0846   CL 105 05/16/2020 0846   CO2 25 05/16/2020 0846   GLUCOSE 95 05/16/2020 0846   BUN 24 (H) 05/16/2020 0846   CREATININE 1.00 05/16/2020 0846   CALCIUM 8.6 (L) 05/16/2020 0846   PROT 7.6 05/16/2020 0846   ALBUMIN 3.1 (L) 05/16/2020 0846   AST 24 05/16/2020 0846   ALT 40 05/16/2020  0846   ALKPHOS 80 05/16/2020 0846   BILITOT 0.5 05/16/2020 0846   GFRNONAA 52 (L) 05/16/2020 0846   GFRAA >60 05/16/2020 0846    No results found for: SPEP, UPEP  Lab Results  Component Value Date   WBC 13.3 (H) 05/16/2020   NEUTROABS 11.3 (H) 05/16/2020   HGB 10.6 (L) 05/16/2020   HCT 33.8 (L) 05/16/2020   MCV 95.2 05/16/2020   PLT 307 05/16/2020      Chemistry      Component Value Date/Time   NA 138 05/16/2020 0846   K 3.7 05/16/2020 0846   CL 105 05/16/2020 0846   CO2 25 05/16/2020 0846   BUN 24 (H) 05/16/2020 0846   CREATININE 1.00 05/16/2020 0846      Component Value Date/Time   CALCIUM 8.6 (L) 05/16/2020 0846   ALKPHOS 80 05/16/2020 0846   AST 24 05/16/2020 0846   ALT 40 05/16/2020 0846   BILITOT 0.5 05/16/2020 0846       RADIOGRAPHIC STUDIES: I have personally reviewed the radiological images as listed and agreed with the findings in the report. No results found.   ASSESSMENT & PLAN:  Primary cancer of right upper lobe of lung ([place]) # Right upper lobe lung cancer-progressive/metastatic-stage IV;  Currently on single agent gemcitabine; June 6th 20 21 CT scan-stable pulmonary nodules; stable-calcified/complex liver cysts.  Stable.  # Continue to hold gemcitabine cycle #4 day-1 today-given poor tolerance to therapy [weight loss/ fatigue].  Recommend chemo holiday-because of poor tolerance to therapy.  I would recommend repeating the scan in the next 6 weeks.  # Weight  loss/worsening fatigue/poor appetite-likely secondary to chemotherapy- improved on Prednsione.  Recommend prednisone 10 mg a day for the next 1 month.  Hold chemotherapy as planned.  # Back pain-nonmalignant s/p evaluation with Dr.Chasnis; s/p injection the back.  Stable.  #COPD -stable.  Continue current medication/inhalers.   I spoke at length with the patient's son, [name]- regarding the patient's clinical status/plan of care.  Family agreement.   # DISPOSITION: # HOLD chemo today; de-access # follow up in 6 weeks; MD- labs- Cbc/Cmp; Gemcitabine; CT chest prior -Dr.B.       Orders Placed This Encounter  Procedures  . CT Chest W Contrast    Standing Status:   Future    Standing Expiration Date:   05/16/2021    Order Specific Question:   If indicated for the ordered procedure, I authorize the administration of contrast media per Radiology protocol    Answer:   Yes    Order Specific Question:   Preferred imaging location?    Answer:   [facility]    Order Specific Question:   Radiology Contrast Protocol - do NOT remove file path    Answer:   [email or web address]  . Comprehensive metabolic panel    Standing Status:   Future    Standing Expiration Date:   05/16/2021  . CBC with Differential    Standing Status:   Future    Standing Expiration Date:   05/16/2021   All questions were answered. The patient knows to call the clinic with any problems, questions or concerns.      [name], MD 05/18/2020 5:59 PM

## 2020-05-27 ENCOUNTER — Ambulatory Visit
Admission: RE | Admit: 2020-05-27 | Discharge: 2020-05-27 | Disposition: A | Discharge status: Home / Self Care | Payer: Medicare Other | Source: Ambulatory Visit | Attending: Physical Medicine and Rehabilitation | Admitting: Physical Medicine and Rehabilitation

## 2020-05-27 ENCOUNTER — Other Ambulatory Visit: Payer: Self-pay

## 2020-05-27 DIAGNOSIS — M5414 Radiculopathy, thoracic region: Secondary | ICD-10-CM | POA: Diagnosis present

## 2020-06-04 ENCOUNTER — Other Ambulatory Visit: Payer: Self-pay

## 2020-06-04 ENCOUNTER — Other Ambulatory Visit: Payer: Self-pay | Admitting: *Deleted

## 2020-06-04 ENCOUNTER — Encounter: Payer: Self-pay | Admitting: Radiation Oncology

## 2020-06-04 ENCOUNTER — Ambulatory Visit
Admission: RE | Admit: 2020-06-04 | Discharge: 2020-06-04 | Disposition: A | Discharge status: Home / Self Care | Payer: Medicare Other | Source: Ambulatory Visit | Attending: Radiation Oncology | Admitting: Radiation Oncology

## 2020-06-04 VITALS — BP 165/85 | HR 88 | Temp 97.6°F | Wt 140.0 lb

## 2020-06-04 DIAGNOSIS — C3411 Malignant neoplasm of upper lobe, right bronchus or lung: Secondary | ICD-10-CM

## 2020-06-04 MED ORDER — OXYCODONE HCL 5 MG PO TABS: 5.0000 mg | ORAL_TABLET | Freq: Two times a day (BID) | ORAL | 0 refills | Status: DC | PRN

## 2020-06-04 NOTE — Progress Notes (Signed)
Radiation Oncology Follow up Note  Name: Brenda Parker   Date:   06/04/2020 MRN:  [id number] DOB: [birth year]    This 84 y.o. female presents to the clinic today for 2-year follow-up status post concurrent rechemoradiation for stage III non-small cell lung cancer of the right lung.  REFERRING PROVIDER: Leonel Ramsay, MD  HPI: Patient is an 84 year old female now about 2 years having completed concurrent chemoradiation therapy for stage III non-small cell lung cancer of the right lung.  Repeat CT scans did show progression of disease in her liver as well as lung.  She has been treated with carbotaxol and now on single agent gemcitabine.  She continues to have weight loss fatigue.  She is receiving injections in her back for nonmalignant back pain.  She had a CT scan in June which I have reviewed showing a stable appearance of the chest with complete opacification of the right upper lobe unchanged from prior exam.  She also was noted stable appearance of right middle lobe and right lower lobe pulmonary nodules.  She specifically [place] cough hemoptysis or chest tightness.  COMPLICATIONS OF TREATMENT: none  FOLLOW UP COMPLIANCE: keeps appointments   PHYSICAL EXAM:  BP (!) 165/85 (BP Location: Left Arm, Patient Position: Sitting, Cuff Size: Normal)   Pulse 88   Temp 97.6 F (36.4 C) (Tympanic)   Wt 140 lb (63.5 kg)   SpO2 97%   BMI 24.03 kg/m  Frail-appearing female in NAD.  Right upper lobe has decreased breath sounds by AMP.  Well-developed well-nourished patient in NAD. HEENT reveals PERLA, EOMI, discs not visualized.  Oral cavity is clear. No oral mucosal lesions are identified. Neck is clear without evidence of cervical or supraclavicular adenopathy. Lungs are clear to A&P. Cardiac examination is essentially unremarkable with regular rate and rhythm without murmur rub or thrill. Abdomen is benign with no organomegaly or masses noted. Motor sensory and DTR levels are equal and symmetric in  the upper and lower extremities. Cranial nerves II through XII are grossly intact. Proprioception is intact. No peripheral adenopathy or edema is identified. No motor or sensory levels are noted. Crude visual fields are within normal range.  RADIOLOGY RESULTS: CT scans reviewed compatible with above-stated findings  PLAN: Present time patient is stable currently undergoing palliative chemotherapy under medical oncology's direction.  She continues follow-up and treatment with Dr. Jacinto Reap.  I have asked to see her back in 1 year for follow-up.  Patient knows to call with any concerns at any time.  I would like to take this opportunity to thank you for allowing me to participate in the care of your patient.Noreene Filbert, MD

## 2020-06-04 NOTE — Telephone Encounter (Signed)
Patient requesting RF on oxycodone.

## 2020-06-25 ENCOUNTER — Other Ambulatory Visit: Payer: Self-pay

## 2020-06-25 ENCOUNTER — Ambulatory Visit
Admission: RE | Admit: 2020-06-25 | Discharge: 2020-06-25 | Disposition: A | Discharge status: Home / Self Care | Payer: Medicare Other | Source: Ambulatory Visit | Attending: Internal Medicine | Admitting: Internal Medicine

## 2020-06-25 DIAGNOSIS — C3411 Malignant neoplasm of upper lobe, right bronchus or lung: Secondary | ICD-10-CM

## 2020-06-25 LAB — POCT I-STAT CREATININE: Creatinine, Ser: 1 mg/dL (ref 0.44–1.00)

## 2020-06-25 MED ORDER — IOHEXOL 300 MG/ML  SOLN
75.0000 mL | Freq: Once | INTRAMUSCULAR | Status: AC | PRN
  Administered 2020-06-25: 75 mL via INTRAVENOUS

## 2020-06-27 ENCOUNTER — Inpatient Hospital Stay: Payer: Medicare Other

## 2020-06-27 ENCOUNTER — Inpatient Hospital Stay: Payer: Medicare Other | Attending: Internal Medicine

## 2020-06-27 ENCOUNTER — Other Ambulatory Visit: Payer: Self-pay | Admitting: *Deleted

## 2020-06-27 ENCOUNTER — Other Ambulatory Visit: Payer: Self-pay

## 2020-06-27 ENCOUNTER — Encounter: Payer: Self-pay | Admitting: Internal Medicine

## 2020-06-27 ENCOUNTER — Inpatient Hospital Stay (HOSPITAL_BASED_OUTPATIENT_CLINIC_OR_DEPARTMENT_OTHER): Payer: Medicare Other | Admitting: Internal Medicine

## 2020-06-27 DIAGNOSIS — Z79899 Other long term (current) drug therapy: Secondary | ICD-10-CM | POA: Insufficient documentation

## 2020-06-27 DIAGNOSIS — C3411 Malignant neoplasm of upper lobe, right bronchus or lung: Secondary | ICD-10-CM

## 2020-06-27 DIAGNOSIS — Z9221 Personal history of antineoplastic chemotherapy: Secondary | ICD-10-CM | POA: Diagnosis not present

## 2020-06-27 DIAGNOSIS — J449 Chronic obstructive pulmonary disease, unspecified: Secondary | ICD-10-CM | POA: Diagnosis not present

## 2020-06-27 DIAGNOSIS — Z87891 Personal history of nicotine dependence: Secondary | ICD-10-CM | POA: Insufficient documentation

## 2020-06-27 DIAGNOSIS — Z853 Personal history of malignant neoplasm of breast: Secondary | ICD-10-CM | POA: Insufficient documentation

## 2020-06-27 DIAGNOSIS — Z923 Personal history of irradiation: Secondary | ICD-10-CM | POA: Insufficient documentation

## 2020-06-27 LAB — COMPREHENSIVE METABOLIC PANEL
ALT: 10 U/L (ref 0–44)
AST: 15 U/L (ref 15–41)
Albumin: 3.2 g/dL — ABNORMAL LOW (ref 3.5–5.0)
Alkaline Phosphatase: 88 U/L (ref 38–126)
Anion gap: 11 (ref 5–15)
BUN: 21 mg/dL (ref 8–23)
CO2: 23 mmol/L (ref 22–32)
Calcium: 9 mg/dL (ref 8.9–10.3)
Chloride: 104 mmol/L (ref 98–111)
Creatinine, Ser: 1.23 mg/dL — ABNORMAL HIGH (ref 0.44–1.00)
GFR calc Af Amer: 47 mL/min — ABNORMAL LOW (ref >60)
GFR calc non Af Amer: 41 mL/min — ABNORMAL LOW (ref >60)
Glucose, Bld: 133 mg/dL — ABNORMAL HIGH (ref 70–99)
Potassium: 4.1 mmol/L (ref 3.5–5.1)
Sodium: 138 mmol/L (ref 135–145)
Total Bilirubin: 0.4 mg/dL (ref 0.3–1.2)
Total Protein: 7.9 g/dL (ref 6.5–8.1)

## 2020-06-27 LAB — CBC WITH DIFFERENTIAL/PLATELET
Abs Immature Granulocytes: 0.05 10*3/uL (ref 0.00–0.07)
Basophils Absolute: 0 10*3/uL (ref 0.0–0.1)
Basophils Relative: 0 %
Eosinophils Absolute: 0.4 10*3/uL (ref 0.0–0.5)
Eosinophils Relative: 4 %
HCT: 34.4 % — ABNORMAL LOW (ref 36.0–46.0)
Hemoglobin: 11 g/dL — ABNORMAL LOW (ref 12.0–15.0)
Immature Granulocytes: 1 %
Lymphocytes Relative: 9 %
Lymphs Abs: 0.8 10*3/uL (ref 0.7–4.0)
MCH: 29 pg (ref 26.0–34.0)
MCHC: 32 g/dL (ref 30.0–36.0)
MCV: 90.8 fL (ref 80.0–100.0)
Monocytes Absolute: 0.8 10*3/uL (ref 0.1–1.0)
Monocytes Relative: 9 %
Neutro Abs: 7.2 10*3/uL (ref 1.7–7.7)
Neutrophils Relative %: 77 %
Platelets: 308 10*3/uL (ref 150–400)
RBC: 3.79 MIL/uL — ABNORMAL LOW (ref 3.87–5.11)
RDW: 16.1 % — ABNORMAL HIGH (ref 11.5–15.5)
WBC: 9.3 10*3/uL (ref 4.0–10.5)
nRBC: 0 % (ref 0.0–0.2)

## 2020-06-27 MED ORDER — HEPARIN SOD (PORK) LOCK FLUSH 100 UNIT/ML IV SOLN
500.0000 [IU] | Freq: Once | INTRAVENOUS | Status: DC
  Filled 2020-06-27: qty 5

## 2020-06-27 NOTE — Telephone Encounter (Signed)
Brenda Parker- I had sent a script in AUG- which pt had not picked up. Please confirm with pt

## 2020-06-27 NOTE — Telephone Encounter (Signed)
Patient called to inquire about her Oxycodone refill. She states that she was told in clinic today that it would be refilled but it has not yet been sent to her pharmacy. If you let me know when it is refilled  I will call her.

## 2020-06-27 NOTE — Assessment & Plan Note (Addendum)
#   Right upper lobe lung cancer-progressive/metastatic-stage IV; most recently on single agent gemcitabine; currently on hold because of poor tolerance; June 25, 2020-CT scan chest-chronic right upper lobe collapse/scarring radiation changes; stable to minimally smaller bilateral lung nodules; stable calcified complex liver cyst.  Overall stable  #Given the overall stability of the lung cancer; poor tolerance to therapy I think is reasonable to hold gemcitabine cycle #4.  Continue chemo holiday.  We will recommend surveillance CT imaging every 3 months or so; or sooner if symptomatic.  # Weight loss/worsening fatigue/poor appetite-likely secondary to chemotherapy- improved on prednisone. Ok to come off prednisone given. Son to call us if the weight loss happens again- will order megace/ marinol.   # Back pain-nonmalignant s/p evaluation with Dr.Chasnis; s/p injection the back; on gabapentin.  STABLE. Needs to pick refill sent on 8/11.   #COPD -stable.  Continue current medication/inhalers.   I spoke at length with the patient's son, Brenda Parker- regarding the patient's clinical status/plan of care.  Family agreement.   # DISPOSITION: # HOLD chemo today;  # follow up in 6 weeks; MD- labs- Cbc/Cmp;NO chemo-Dr.B.  # I reviewed the blood work- with the patient in detail; also reviewed the imaging independently [as summarized above]; and with the patient in detail.

## 2020-06-27 NOTE — Telephone Encounter (Signed)
Attempted to call patient for clarification no answer and no voice mail available.

## 2020-06-27 NOTE — Progress Notes (Signed)
[facility] OFFICE PROGRESS NOTE  Patient Care Team: Leonel Ramsay, MD as PCP - General (Infectious Diseases) Telford Nab, RN as Registered Nurse [name], MD as Medical Oncologist (Medical Oncology) [name], MD as Referring Physician (Physical Medicine and Rehabilitation)  Cancer Staging No matching staging information was found for the patient.   Oncology History Overview Note  # April 2019- RUL LUNG CANCER-non-small cell; favor squamous cell; PET scan T4N0 [right upper lobe mass involving[mediastinal/blood results] vs small pleural effusion [?M1]- CARBO-TAXOL- RT;   #October 2020-CT progressive subcentimeter right-sided lung lesions;  # NOV 17th 2020- KEYTRUDA q3 W  # 24th FEB 2021- Carbo-Taxol weekly; # 5 treatments.  March 30-2021 progressive disease liver lesion; stable lung.  #February 01, 2020-gemcitabine [800 mg/m]  # Hx of Right breast ca [s/p lumpec; RT; No chemo; "pill" x5 years]  # COPD/Hx of smoking  # PALLIATIVE CARE: 12/19/2019- DELCINED  MOLECULAR TESTING/OMNISEQ: PDL-1 25% [TPS; TMB-H; No targets**]  --------------------------------------------------    DIAGNOSIS: [ April 2019]SQUAMOUS CELL LUNG CA  STAGE: IV/Recurrent ;GOALS: PALLIATIVE  CURRENT/MOST RECENT THERAPY-  gemcitabine [C] .     Primary cancer of right upper lobe of lung ([place])  03/13/2018 - 05/15/2018 Chemotherapy   The patient had palonosetron (ALOXI) injection 0.25 mg, 0.25 mg, Intravenous,  Once, 9 of 9 cycles Administration: 0.25 mg (03/13/2018), 0.25 mg (04/10/2018), 0.25 mg (04/17/2018), 0.25 mg (03/21/2018), 0.25 mg (04/25/2018), 0.25 mg (03/27/2018), 0.25 mg (04/03/2018), 0.25 mg (05/01/2018), 0.25 mg (05/08/2018) CARBOplatin (PARAPLATIN) 130 mg in sodium chloride 0.9 % 250 mL chemo infusion, 130 mg (100 % of original dose 131.4 mg), Intravenous,  Once, 9 of 9 cycles Dose modification:   (original dose 131.4 mg, Cycle 1) Administration: 130 mg  (03/13/2018), 130 mg (04/10/2018), 130 mg (04/17/2018), 130 mg (03/21/2018), 130 mg (04/25/2018), 130 mg (03/27/2018), 130 mg (04/03/2018), 130 mg (05/01/2018), 130 mg (05/08/2018) PACLitaxel (TAXOL) 78 mg in sodium chloride 0.9 % 250 mL chemo infusion (</= 50m/m2), 45 mg/m2 = 78 mg, Intravenous,  Once, 9 of 9 cycles Administration: 78 mg (03/13/2018), 78 mg (04/10/2018), 78 mg (04/17/2018), 78 mg (03/21/2018), 78 mg (04/25/2018), 78 mg (03/27/2018), 78 mg (04/03/2018), 78 mg (05/01/2018), 78 mg (05/08/2018)  for chemotherapy treatment.    09/12/2019 - 12/04/2019 Chemotherapy   The patient had pembrolizumab (KEYTRUDA) 200 mg in sodium chloride 0.9 % 50 mL chemo infusion, 200 mg, Intravenous, Once, 4 of 6 cycles Administration: 200 mg (09/12/2019), 200 mg (10/03/2019), 200 mg (10/24/2019), 200 mg (11/14/2019)  for chemotherapy treatment.    12/19/2019 - 01/24/2020 Chemotherapy   The patient had palonosetron (ALOXI) injection 0.25 mg, 0.25 mg, Intravenous,  Once, 5 of 8 cycles Administration: 0.25 mg (12/19/2019), 0.25 mg (12/28/2019), 0.25 mg (01/18/2020), 0.25 mg (01/04/2020), 0.25 mg (01/11/2020) CARBOplatin (PARAPLATIN) 120 mg in sodium chloride 0.9 % 100 mL chemo infusion, 120 mg (100 % of original dose 122 mg), Intravenous,  Once, 5 of 8 cycles Dose modification:   (original dose 122 mg, Cycle 1) Administration: 120 mg (12/19/2019), 120 mg (12/28/2019), 120 mg (01/18/2020), 120 mg (01/04/2020), 120 mg (01/11/2020) PACLitaxel (TAXOL) 138 mg in sodium chloride 0.9 % 250 mL chemo infusion (</= 872mm2), 80 mg/m2 = 138 mg, Intravenous,  Once, 5 of 8 cycles Administration: 138 mg (12/19/2019), 138 mg (12/28/2019), 138 mg (01/18/2020), 138 mg (01/04/2020), 138 mg (01/11/2020)  for chemotherapy treatment.    02/01/2020 -  Chemotherapy   The patient had gemcitabine (GEMZAR) 1,400 mg in sodium chloride 0.9 %  250 mL chemo infusion, 1,368 mg (100 % of original dose 800 mg/m2), Intravenous,  Once, 3 of 4 cycles Dose modification: 800 mg/m2 (original  dose 800 mg/m2, Cycle 1, Reason: Provider Judgment) Administration: 1,400 mg (02/01/2020), 1,400 mg (02/08/2020), 1,400 mg (02/22/2020), 1,400 mg (02/29/2020), 1,400 mg (03/21/2020), 1,400 mg (03/14/2020)  for chemotherapy treatment.        INTERVAL HISTORY: Patient a poor historian.  B[facility]da [facility] 84 y.o.  female patient with recurrent/metastatic squamous cell lung cancer-currently on  gemcitabine chemotherapy is here for follow-up/review results of the CT scan.  Patient is off chemotherapy for the last 8 weeks because of poor tolerance to therapy.  Patient is currently on prednisone because of poor appetite, notes to have gaining weight.  Continue have chronic back pain-not any worse.  She has been taking oxycodone sparingly.  No headaches.  No new shortness of breath or cough.   Review of Systems  Constitutional: Positive for malaise/fatigue. Negative for chills, diaphoresis and fever.  HENT: Negative for nosebleeds and sore throat.   Eyes: Negative for double vision.  Respiratory: Positive for cough and shortness of breath. Negative for hemoptysis, sputum production and wheezing.   Cardiovascular: Negative for chest pain, palpitations and orthopnea.  Gastrointestinal: Negative for abdominal pain, blood in stool, heartburn, melena, nausea and vomiting.  Genitourinary: Negative for dysuria, frequency and urgency.  Musculoskeletal: Positive for back pain and joint pain.  Skin: Negative.  Negative for itching and rash.  Neurological: Negative for tingling, focal weakness, weakness and headaches.  Endo/Heme/Allergies: Does not bruise/bleed easily.  Psychiatric/Behavioral: Negative for depression. The patient is not nervous/anxious and does not have insomnia.     PAST MEDICAL HISTORY :  Past Medical History:  Diagnosis Date  . Benign liver cyst   . Breast cancer ([place]) 2005   right breast  . COPD (chronic obstructive pulmonary disease) ([place])   . Dyspnea   . Glaucoma   . Hemoptysis 2019   . Hypertension    Not on medication for htn. patient denies this  . Lung mass 01/2018  . Personal history of radiation therapy     PAST SURGICAL HISTORY :   Past Surgical History:  Procedure Laterality Date  . BREAST EXCISIONAL BIOPSY Right 2005   positive, radiation  . BREAST LUMPECTOMY Right 03/2004  . CHOLECYSTECTOMY  1988  . COLONOSCOPY  2012  . ENDOBRONCHIAL ULTRASOUND N/A 02/21/2018   Procedure: ENDOBRONCHIAL ULTRASOUND;  Surgeon: Laverle Hobby, MD;  Location: [facility] ORS;  Service: Pulmonary;  Laterality: N/A;  . EYE SURGERY Bilateral 2009,2010   cataract extractions with lens implant  . GALLBLADDER SURGERY Bilateral   . IR FLUORO GUIDE PORT INSERTION RIGHT  03/10/2018  . JOINT REPLACEMENT Left 2008   left hip replacement  . PORTACATH PLACEMENT  03/10/2018  . TONSILLECTOMY  1960    FAMILY HISTORY :   Family History  Problem Relation Age of Onset  . Breast cancer Mother 40  . CAD Father     SOCIAL HISTORY:   Social History   Tobacco Use  . Smoking status: Former Smoker    Packs/day: 1.00    Types: Cigarettes    Quit date: 10/25/2005    Years since quitting: 14.6  . Smokeless tobacco: Never Used  Vaping Use  . Vaping Use: Never used  Substance Use Topics  . Alcohol use: Not Currently    Comment: occasional glass of beer  . Drug use: Never    ALLERGIES:  is allergic to penicillins.  MEDICATIONS:  Current Outpatient Medications  Medication Sig Dispense Refill  . albuterol (PROVENTIL HFA) 108 (90 Base) MCG/ACT inhaler Inhale 2 puffs into the lungs every 6 (six) hours as needed for wheezing or shortness of breath.     . Cholecalciferol (VITAMIN D3) 1000 units CAPS Take 3,000 Units by mouth daily.     Marland Kitchen gabapentin (NEURONTIN) 100 MG capsule 1po q evening x 4 days, then bid x 4 days, then tid as tolerated    . latanoprost (XALATAN) 0.005 % ophthalmic solution Apply 1 drop to eye daily.    Marland Kitchen oxyCODONE (OXY IR/ROXICODONE) 5 MG immediate release tablet Take  1 tablet (5 mg total) by mouth 2 (two) times daily as needed for moderate pain or severe pain. 45 tablet 0  . TRELEGY ELLIPTA 100-62.5-25 MCG/INH AEPB     . ondansetron (ZOFRAN) 8 MG tablet One pill every 8 hours as needed for nausea/vomitting. (Patient not taking: Reported on 06/04/2020) 40 tablet 1  . predniSONE (DELTASONE) 10 MG tablet Take 1 tablet (10 mg total) by mouth daily with breakfast. (Patient not taking: Reported on 06/27/2020) 30 tablet 0   No current facility-administered medications for this visit.   Facility-Administered Medications Ordered in Other Visits  Medication Dose Route Frequency Provider Last Rate Last Admin  . heparin lock flush 100 unit/mL  500 Units Intracatheter Once Charlaine Dalton R, MD      . sodium chloride flush (NS) 0.9 % injection 10 mL  10 mL Intravenous PRN [name], MD   10 mL at 05/02/20 0905    PHYSICAL EXAMINATION: ECOG PERFORMANCE STATUS: 1 - Symptomatic but completely ambulatory  BP 110/70 (BP Location: Right Arm, Patient Position: Sitting, Cuff Size: Normal)   Pulse 90   Temp 98.2 F (36.8 C) (Tympanic)   Resp 20   Ht 5' 4"  (1.626 m)   Wt 140 lb (63.5 kg)   SpO2 98%   BMI 24.03 kg/m   Filed Weights   06/27/20 0935  Weight: 140 lb (63.5 kg)    Physical Exam Constitutional:      Comments: [place] Caucasian female patient..  She is walking herself.  Accompanied by her son.  HENT:     Head: Normocephalic and atraumatic.     Mouth/Throat:     Pharynx: No oropharyngeal exudate.  Eyes:     Pupils: Pupils are equal, round, and reactive to light.  Cardiovascular:     Rate and Rhythm: Normal rate and regular rhythm.  Pulmonary:     Effort: No respiratory distress.     Breath sounds: No wheezing.  Abdominal:     General: Bowel sounds are normal. There is no distension.     Palpations: Abdomen is soft. There is no mass.     Tenderness: There is no abdominal tenderness. There is no guarding or rebound.   Musculoskeletal:        General: No tenderness. Normal range of motion.     Cervical back: Normal range of motion and neck supple.  Skin:    General: Skin is warm.  Neurological:     Mental Status: She is alert and oriented to person, place, and time.  Psychiatric:        Mood and Affect: Affect normal.     LABORATORY DATA:  I have reviewed the data as listed    Component Value Date/Time   NA 138 06/27/2020 0924   K 4.1 06/27/2020 0924   CL 104 06/27/2020 0924   CO2 23  06/27/2020 0924   GLUCOSE 133 (H) 06/27/2020 0924   BUN 21 06/27/2020 0924   CREATININE 1.23 (H) 06/27/2020 0924   CALCIUM 9.0 06/27/2020 0924   PROT 7.9 06/27/2020 0924   ALBUMIN 3.2 (L) 06/27/2020 0924   AST 15 06/27/2020 0924   ALT 10 06/27/2020 0924   ALKPHOS 88 06/27/2020 0924   BILITOT 0.4 06/27/2020 0924   GFRNONAA 41 (L) 06/27/2020 0924   GFRAA 47 (L) 06/27/2020 0924    No results found for: SPEP, UPEP  Lab Results  Component Value Date   WBC 9.3 06/27/2020   NEUTROABS 7.2 06/27/2020   HGB 11.0 (L) 06/27/2020   HCT 34.4 (L) 06/27/2020   MCV 90.8 06/27/2020   PLT 308 06/27/2020      Chemistry      Component Value Date/Time   NA 138 06/27/2020 0924   K 4.1 06/27/2020 0924   CL 104 06/27/2020 0924   CO2 23 06/27/2020 0924   BUN 21 06/27/2020 0924   CREATININE 1.23 (H) 06/27/2020 0924      Component Value Date/Time   CALCIUM 9.0 06/27/2020 0924   ALKPHOS 88 06/27/2020 0924   AST 15 06/27/2020 0924   ALT 10 06/27/2020 0924   BILITOT 0.4 06/27/2020 0924       RADIOGRAPHIC STUDIES: I have personally reviewed the radiological images as listed and agreed with the findings in the report. CT Chest W Contrast  Result Date: 06/25/2020 CLINICAL DATA:  Lung cancer, assess treatment response. Treated with radiation therapy. Currently on chemotherapy. Fatigue. History of breast cancer. EXAM: CT CHEST WITH CONTRAST TECHNIQUE: Multidetector CT imaging of the chest was performed during  intravenous contrast administration. CONTRAST:  69m OMNIPAQUE IOHEXOL 300 MG/ML  SOLN COMPARISON:  03/31/2020.  MR thoracic spine 05/27/2020. FINDINGS: Cardiovascular: Left IJ Port-A-Cath terminates in the SVC. Atherosclerotic calcification of the aorta, aortic valve and coronary arteries. Heart size within normal limits. No pericardial effusion. Mediastinum/Nodes: No pathologically enlarged mediastinal, left hilar or axillary lymph nodes. Esophagus is grossly unremarkable. Lungs/Pleura: Heterogeneous collapse/consolidation in the right upper lobe with extension to the central right middle lobe and right lower lobe. Cavitary appearing component in the superior segment right lower lobe (2/47), as before. Right middle lobe nodule is stable, measuring 1.2 x 1.3 cm (3/75). Multiple right lower lobe nodules are minimally smaller. Index nodule measures 8 x 11 mm (3/85), compared to 11 x 13 mm on 03/31/2020. Areas of volume loss and scarring in the right middle lobe and right lower lobe. No left pleural effusion. Centrilobular emphysema. There is irregularity of the right mainstem bronchus. Airway is otherwise unremarkable. Upper Abdomen: Low-attenuation exophytic mass off segment 3 of the liver has peripheral calcification and is stable in size, 4.9 x 4.9 cm. Additional low-attenuation lesions in the liver measure up to 3.3 x 4.0 cm in the dome, stable. Right adrenal gland is unremarkable. Slight thickening of the left adrenal gland. Visualized portion of the right kidney is unremarkable. Probable 1.4 cm left renal sinus cyst. Visualized portions of the spleen, pancreas, stomach and bowel are grossly unremarkable. Musculoskeletal: Degenerative changes in the spine. No worrisome lytic or sclerotic lesions. Peripherally calcified ovoid low-density lesion in the right breast measures 1.2 x 2.5 cm, unchanged. Healed fractures of the right first second and third anterior ribs. IMPRESSION: 1. Post treatment  collapse/consolidation in the upper right hemithorax with a cavitary component in the right lower lobe, similar to 03/31/2020. 2. Right middle right lower lobe nodules are stable to minimally  smaller in size. 3. Low-attenuation lesions in the liver, as described above, unchanged. 4. Aortic atherosclerosis (ICD10-I70.0). Coronary artery calcification. 5. Emphysema (ICD10-J43.9). Electronically Signed   By: Lorin Picket M.D.   On: 06/25/2020 13:32     ASSESSMENT & PLAN:  Primary cancer of right upper lobe of lung ([place]) # Right upper lobe lung cancer-progressive/metastatic-stage IV; most recently on single agent gemcitabine; currently on hold because of poor tolerance; June 25, 2020-CT scan chest-chronic right upper lobe collapse/scarring radiation changes; stable to minimally smaller bilateral lung nodules; stable calcified complex liver cyst.  Overall stable  #Given the overall stability of the lung cancer; poor tolerance to therapy I think is reasonable to hold gemcitabine cycle #4.  We will recommend surveillance CT imaging every 3 months or so; or sooner if symptomatic.  # Weight loss/worsening fatigue/poor appetite-likely secondary to chemotherapy- improved on prednisone. Ok to come off prednisone given. Son to call us if the weight loss happens again- will order megace/ marinol.   # Back pain-nonmalignant s/p evaluation with Dr.Chasnis; s/p injection the back; on gabapentin.  STABLE. Needs to pick refill sent on 8/11.   #COPD -stable.  Continue current medication/inhalers.   I spoke at length with the patient's son, [name]- regarding the patient's clinical status/plan of care.  Family agreement.   # DISPOSITION: # HOLD chemo today;  # follow up in 6 weeks; MD- labs- Cbc/Cmp;NO chemo-Dr.B.  # I reviewed the blood work- with the patient in detail; also reviewed the imaging independently [as summarized above]; and with the patient in detail.       No orders of the defined types were  placed in this encounter.  All questions were answered. The patient knows to call the clinic with any problems, questions or concerns.      [name], MD 06/27/2020 10:26 AM

## 2020-07-01 NOTE — Telephone Encounter (Signed)
Patient returned call this AM regarding oxycondone  5 mg. She stated that the pharmacy had no prescription for her. Writer verified that pharmacy has no active prescriptions for this patient.

## 2020-07-01 NOTE — Telephone Encounter (Signed)
Dr. Rogue Bussing - please sign script.

## 2020-07-02 MED ORDER — OXYCODONE HCL 5 MG PO TABS
5.0000 mg | ORAL_TABLET | Freq: Two times a day (BID) | ORAL | 0 refills | Status: DC | PRN
Start: 2020-07-02 — End: 2020-07-30

## 2020-07-21 ENCOUNTER — Emergency Department: Payer: Medicare Other

## 2020-07-21 ENCOUNTER — Other Ambulatory Visit: Payer: Self-pay

## 2020-07-21 DIAGNOSIS — Z20822 Contact with and (suspected) exposure to covid-19: Secondary | ICD-10-CM | POA: Insufficient documentation

## 2020-07-21 DIAGNOSIS — Z5321 Procedure and treatment not carried out due to patient leaving prior to being seen by health care provider: Secondary | ICD-10-CM | POA: Insufficient documentation

## 2020-07-21 DIAGNOSIS — E860 Dehydration: Secondary | ICD-10-CM | POA: Diagnosis not present

## 2020-07-21 DIAGNOSIS — J449 Chronic obstructive pulmonary disease, unspecified: Secondary | ICD-10-CM | POA: Insufficient documentation

## 2020-07-21 DIAGNOSIS — R05 Cough: Secondary | ICD-10-CM | POA: Insufficient documentation

## 2020-07-21 DIAGNOSIS — J189 Pneumonia, unspecified organism: Secondary | ICD-10-CM | POA: Diagnosis not present

## 2020-07-21 DIAGNOSIS — R0602 Shortness of breath: Secondary | ICD-10-CM | POA: Insufficient documentation

## 2020-07-21 LAB — CBC
HCT: 30 % — ABNORMAL LOW (ref 36.0–46.0)
Hemoglobin: 9.7 g/dL — ABNORMAL LOW (ref 12.0–15.0)
MCH: 28.4 pg (ref 26.0–34.0)
MCHC: 32.3 g/dL (ref 30.0–36.0)
MCV: 87.7 fL (ref 80.0–100.0)
Platelets: 376 10*3/uL (ref 150–400)
RBC: 3.42 MIL/uL — ABNORMAL LOW (ref 3.87–5.11)
RDW: 16.2 % — ABNORMAL HIGH (ref 11.5–15.5)
WBC: 12 10*3/uL — ABNORMAL HIGH (ref 4.0–10.5)
nRBC: 0 % (ref 0.0–0.2)

## 2020-07-21 LAB — COMPREHENSIVE METABOLIC PANEL
ALT: 12 U/L (ref 0–44)
AST: 14 U/L — ABNORMAL LOW (ref 15–41)
Albumin: 2.8 g/dL — ABNORMAL LOW (ref 3.5–5.0)
Alkaline Phosphatase: 82 U/L (ref 38–126)
Anion gap: 13 (ref 5–15)
BUN: 27 mg/dL — ABNORMAL HIGH (ref 8–23)
CO2: 18 mmol/L — ABNORMAL LOW (ref 22–32)
Calcium: 8.9 mg/dL (ref 8.9–10.3)
Chloride: 103 mmol/L (ref 98–111)
Creatinine, Ser: 1.07 mg/dL — ABNORMAL HIGH (ref 0.44–1.00)
GFR calc Af Amer: 56 mL/min — ABNORMAL LOW (ref >60)
GFR calc non Af Amer: 48 mL/min — ABNORMAL LOW (ref >60)
Glucose, Bld: 133 mg/dL — ABNORMAL HIGH (ref 70–99)
Potassium: 3.3 mmol/L — ABNORMAL LOW (ref 3.5–5.1)
Sodium: 134 mmol/L — ABNORMAL LOW (ref 135–145)
Total Bilirubin: 0.9 mg/dL (ref 0.3–1.2)
Total Protein: 8 g/dL (ref 6.5–8.1)

## 2020-07-21 LAB — RESPIRATORY PANEL BY RT PCR (FLU A&B, COVID)
Influenza A by PCR: NEGATIVE
Influenza B by PCR: NEGATIVE
SARS Coronavirus 2 by RT PCR: NEGATIVE

## 2020-07-21 LAB — TROPONIN I (HIGH SENSITIVITY): Troponin I (High Sensitivity): 9 ng/L (ref <18)

## 2020-07-21 NOTE — ED Triage Notes (Signed)
Pt in with [facility] since yesterday has hx copd and lung ca which she is not currently getting treatments for. Pt states also has developed a cough recently states productive at times. Pt able to speak in complete sentences at this time with no distress.

## 2020-07-22 ENCOUNTER — Inpatient Hospital Stay (HOSPITAL_BASED_OUTPATIENT_CLINIC_OR_DEPARTMENT_OTHER): Payer: Medicare Other | Admitting: Oncology

## 2020-07-22 ENCOUNTER — Emergency Department
Admission: EM | Admit: 2020-07-22 | Discharge: 2020-07-22 | Disposition: A | Discharge status: Home / Self Care | Payer: Medicare Other | Source: Home / Self Care

## 2020-07-22 ENCOUNTER — Telehealth: Payer: Self-pay | Admitting: Internal Medicine

## 2020-07-22 ENCOUNTER — Inpatient Hospital Stay: Payer: Medicare Other

## 2020-07-22 VITALS — BP 138/68 | HR 100 | Temp 99.1°F | Resp 18

## 2020-07-22 DIAGNOSIS — R197 Diarrhea, unspecified: Secondary | ICD-10-CM

## 2020-07-22 DIAGNOSIS — C3411 Malignant neoplasm of upper lobe, right bronchus or lung: Secondary | ICD-10-CM

## 2020-07-22 DIAGNOSIS — J189 Pneumonia, unspecified organism: Secondary | ICD-10-CM | POA: Diagnosis not present

## 2020-07-22 DIAGNOSIS — J069 Acute upper respiratory infection, unspecified: Secondary | ICD-10-CM

## 2020-07-22 LAB — GASTROINTESTINAL PANEL BY PCR, STOOL (REPLACES STOOL CULTURE)

## 2020-07-22 LAB — C DIFFICILE QUICK SCREEN W PCR REFLEX
C Diff antigen: NEGATIVE
C Diff interpretation: NOT DETECTED
C Diff toxin: NEGATIVE

## 2020-07-22 LAB — TROPONIN I (HIGH SENSITIVITY): Troponin I (High Sensitivity): 9 ng/L (ref <18)

## 2020-07-22 MED ORDER — SODIUM CHLORIDE 0.9% FLUSH
10.0000 mL | INTRAVENOUS | Status: DC | PRN
  Administered 2020-07-22: 10 mL via INTRAVENOUS
  Filled 2020-07-22: qty 10

## 2020-07-22 MED ORDER — HEPARIN SOD (PORK) LOCK FLUSH 100 UNIT/ML IV SOLN
500.0000 [IU] | Freq: Once | INTRAVENOUS | Status: AC
  Administered 2020-07-22: 500 [IU] via INTRAVENOUS
  Filled 2020-07-22: qty 5

## 2020-07-22 MED ORDER — PREDNISONE 20 MG PO TABS: 40.0000 mg | ORAL_TABLET | Freq: Every day | ORAL | 0 refills | Status: DC

## 2020-07-22 MED ORDER — AZITHROMYCIN 250 MG PO TABS: ORAL_TABLET | ORAL | 0 refills | Status: DC

## 2020-07-22 MED ORDER — SODIUM CHLORIDE 0.9 % IV SOLN
Freq: Once | INTRAVENOUS | Status: AC
  Filled 2020-07-22: qty 250

## 2020-07-22 MED ORDER — SODIUM CHLORIDE 0.9 % IV SOLN
2.0000 g | Freq: Once | INTRAVENOUS | Status: AC
  Administered 2020-07-22: 2 g via INTRAVENOUS
  Filled 2020-07-22: qty 20

## 2020-07-22 MED ORDER — DEXTROSE 5 % IV SOLN
2.0000 g | INTRAVENOUS | Status: DC
  Filled 2020-07-22: qty 20

## 2020-07-22 NOTE — Progress Notes (Signed)
Went ED by EMS yesterday evening. Left prior to getting any treatment. Pt having episodes of low grade fever, disorientation.Low O2 saturation at the house yesterday. Dyspnea and cough. Episodes of oozing stool and incontinence. Weak. Chronic loss of appetite. Recived 552ml of NS saline today. Rocephin 2 gram IV today. Plan for pt to come back to clinic tomorrow and Thurs for 1 dose of rocephin each day. Pt accompanied by son and discharged from clinic via wheelchair. Port needle removed at discharge due to periods of confusion.

## 2020-07-22 NOTE — Telephone Encounter (Signed)
On 9/27-spoke to patient's son-regarding worsening fatigue shortness of breath; low-grade fever.  Directed to emergency room given elevated heart rate; saturations 90.  Work-up in the emergency room negative for Covid/flu; chest x-ray-progressive findings-from underlying lung cancer/possible superimposed pneumonia.  Patient left the emergency room without any further management/treatment.  On 9/28-spoke to patient's son-patient needs to be seen in the clinic today-for IV fluids/IV antibiotics-ceftriaxone.   Lauren/Jenny- please follow up.   Thanks, GB

## 2020-07-22 NOTE — Progress Notes (Signed)
Symptom Management Consult note [facility]  [phone] [phone]  Patient Care Team: Leonel Ramsay, MD as PCP - General (Infectious Diseases) Telford Nab, RN as Registered Nurse [name], MD as Medical Oncologist (Medical Oncology) [name], MD as Referring Physician (Physical Medicine and Rehabilitation)   Name of the patient: Brenda Parker  [id number]  10/29/1935   Date of visit: 07/22/2020   Diagnosis-lung cancer  Chief complaint/ Reason for visit-pneumonia  Heme/Onc history:  Oncology History Overview Note  # April 2019- RUL LUNG CANCER-non-small cell; favor squamous cell; PET scan T4N0 [right upper lobe mass involving[mediastinal/blood results] vs small pleural effusion [?M1]- CARBO-TAXOL- RT;   #October 2020-CT progressive subcentimeter right-sided lung lesions;  # NOV 17th 2020- KEYTRUDA q3 W  # 24th FEB 2021- Carbo-Taxol weekly; # 5 treatments.  March 30-2021 progressive disease liver lesion; stable lung.  #February 01, 2020-gemcitabine [800 mg/m]  # Hx of Right breast ca [s/p lumpec; RT; No chemo; "pill" x5 years]  # COPD/Hx of smoking  # PALLIATIVE CARE: 12/19/2019- DELCINED  MOLECULAR TESTING/OMNISEQ: PDL-1 25% [TPS; TMB-H; No targets**]  --------------------------------------------------    DIAGNOSIS: [ April 2019]SQUAMOUS CELL LUNG CA  STAGE: IV/Recurrent ;GOALS: PALLIATIVE  CURRENT/MOST RECENT THERAPY-  gemcitabine [C] .     Primary cancer of right upper lobe of lung ([place])  03/13/2018 - 05/15/2018 Chemotherapy   The patient had palonosetron (ALOXI) injection 0.25 mg, 0.25 mg, Intravenous,  Once, 9 of 9 cycles Administration: 0.25 mg (03/13/2018), 0.25 mg (04/10/2018), 0.25 mg (04/17/2018), 0.25 mg (03/21/2018), 0.25 mg (04/25/2018), 0.25 mg (03/27/2018), 0.25 mg (04/03/2018), 0.25 mg (05/01/2018), 0.25 mg (05/08/2018) CARBOplatin (PARAPLATIN) 130 mg in sodium chloride 0.9 % 250 mL chemo  infusion, 130 mg (100 % of original dose 131.4 mg), Intravenous,  Once, 9 of 9 cycles Dose modification:   (original dose 131.4 mg, Cycle 1) Administration: 130 mg (03/13/2018), 130 mg (04/10/2018), 130 mg (04/17/2018), 130 mg (03/21/2018), 130 mg (04/25/2018), 130 mg (03/27/2018), 130 mg (04/03/2018), 130 mg (05/01/2018), 130 mg (05/08/2018) PACLitaxel (TAXOL) 78 mg in sodium chloride 0.9 % 250 mL chemo infusion (</= 2m/m2), 45 mg/m2 = 78 mg, Intravenous,  Once, 9 of 9 cycles Administration: 78 mg (03/13/2018), 78 mg (04/10/2018), 78 mg (04/17/2018), 78 mg (03/21/2018), 78 mg (04/25/2018), 78 mg (03/27/2018), 78 mg (04/03/2018), 78 mg (05/01/2018), 78 mg (05/08/2018)  for chemotherapy treatment.    09/12/2019 - 12/04/2019 Chemotherapy   The patient had pembrolizumab (KEYTRUDA) 200 mg in sodium chloride 0.9 % 50 mL chemo infusion, 200 mg, Intravenous, Once, 4 of 6 cycles Administration: 200 mg (09/12/2019), 200 mg (10/03/2019), 200 mg (10/24/2019), 200 mg (11/14/2019)  for chemotherapy treatment.    12/19/2019 - 01/24/2020 Chemotherapy   The patient had palonosetron (ALOXI) injection 0.25 mg, 0.25 mg, Intravenous,  Once, 5 of 8 cycles Administration: 0.25 mg (12/19/2019), 0.25 mg (12/28/2019), 0.25 mg (01/18/2020), 0.25 mg (01/04/2020), 0.25 mg (01/11/2020) CARBOplatin (PARAPLATIN) 120 mg in sodium chloride 0.9 % 100 mL chemo infusion, 120 mg (100 % of original dose 122 mg), Intravenous,  Once, 5 of 8 cycles Dose modification:   (original dose 122 mg, Cycle 1) Administration: 120 mg (12/19/2019), 120 mg (12/28/2019), 120 mg (01/18/2020), 120 mg (01/04/2020), 120 mg (01/11/2020) PACLitaxel (TAXOL) 138 mg in sodium chloride 0.9 % 250 mL chemo infusion (</= 826mm2), 80 mg/m2 = 138 mg, Intravenous,  Once, 5 of 8 cycles Administration: 138 mg (12/19/2019), 138 mg (12/28/2019), 138 mg (01/18/2020), 138 mg (01/04/2020), 138  mg (01/11/2020)  for chemotherapy treatment.    02/01/2020 -  Chemotherapy   The patient had gemcitabine (GEMZAR) 1,400 mg in  sodium chloride 0.9 % 250 mL chemo infusion, 1,368 mg (100 % of original dose 800 mg/m2), Intravenous,  Once, 3 of 4 cycles Dose modification: 800 mg/m2 (original dose 800 mg/m2, Cycle 1, Reason: Provider Judgment) Administration: 1,400 mg (02/01/2020), 1,400 mg (02/08/2020), 1,400 mg (02/22/2020), 1,400 mg (02/29/2020), 1,400 mg (03/21/2020), 1,400 mg (03/14/2020)  for chemotherapy treatment.     Interval history-Ms. Eckert is an 84 year old female with past medical history significant for hypertension, psoriasis, hyperlipidemia, liver lesion who was diagnosed with right upper lobe lung cancer in April 2019.  She has had several lines of treatment secondary to progression of disease.  She is under the care of Dr. Rogue Bussing and currently on chemotherapy treatment with gemcitabine.  She was last seen by Dr. Rogue Bussing on 06/27/2020 for treatment was held secondary to poor tolerance.  Imaging from 06/25/2020 showed stable disease.  She is scheduled to follow-up in approximately 6 weeks.  Patient presented last night to the emergency room for shortness of breath and cough.  She left before being seen by a physician but did have a negative Covid test, normal sinus rhythm on EKG and a chest x-ray that showed increasing faint hazy opacity in lung bases which could represent atelectasis or early edema or infection.  Since she had a left the emergency room, Dr. Rogue Bussing recommended she come in to be evaluated in symptom management this morning for IV fluids and IV antibiotics.  Today, patient reports feeling okay with persistent cough without sputum production, low-grade temperature and confusion.  She is with her son.  Patient son states she is more confused than usual.  Has underlying Alzheimer's/dementia.  Symptoms developed Saturday evening and worsened on Sunday.  She also has diarrhea been incontinent over the past couple days.   ECOG FS:2 - Symptomatic, <50% confined to bed  Review of systems- Review of Systems    Constitutional: Negative.  Negative for chills, fever, malaise/fatigue and weight loss.  HENT: Negative for congestion, ear pain and tinnitus.   Eyes: Negative.  Negative for blurred vision and double vision.  Respiratory: Positive for cough and shortness of breath. Negative for sputum production.   Cardiovascular: Negative.  Negative for chest pain, palpitations and leg swelling.  Gastrointestinal: Positive for diarrhea. Negative for abdominal pain, constipation, nausea and vomiting.  Genitourinary: Negative for dysuria, frequency and urgency.  Musculoskeletal: Negative for back pain and falls.  Skin: Negative.  Negative for rash.  Neurological: Positive for weakness. Negative for headaches.  Endo/Heme/Allergies: Negative.  Does not bruise/bleed easily.  Psychiatric/Behavioral: Positive for memory loss. Negative for depression. The patient is not nervous/anxious and does not have insomnia.      Current treatment-currently on hold secondary to progression and intolerance  Allergies  Allergen Reactions   Penicillins Rash    Patient had a rash after an injection in 1973.  Unsure if taken orally will cause any reaction  Has patient had a PCN reaction causing immediate rash, facial/tongue/throat swelling, SOB or lightheadedness with hypotension: Yes Has patient had a PCN reaction causing severe rash involving mucus membranes or skin necrosis: No Has patient had a PCN reaction that required hospitalization: No Has patient had a PCN reaction occurring within the last 10 years: No      Past Medical History:  Diagnosis Date   Benign liver cyst    Breast cancer ([place]) 2005  right breast   COPD (chronic obstructive pulmonary disease) ([facility])    Dyspnea    Glaucoma    Hemoptysis 2019   Hypertension    Not on medication for htn. patient denies this   Lung mass 01/2018   Personal history of radiation therapy      Past Surgical History:  Procedure Laterality Date   BREAST  EXCISIONAL BIOPSY Right 2005   positive, radiation   BREAST LUMPECTOMY Right 03/2004   CHOLECYSTECTOMY  1988   COLONOSCOPY  2012   ENDOBRONCHIAL ULTRASOUND N/A 02/21/2018   Procedure: ENDOBRONCHIAL ULTRASOUND;  Surgeon: Laverle Hobby, MD;  Location: [facility] ORS;  Service: Pulmonary;  Laterality: N/A;   EYE SURGERY Bilateral 2009,2010   cataract extractions with lens implant   GALLBLADDER SURGERY Bilateral    IR FLUORO GUIDE PORT INSERTION RIGHT  03/10/2018   JOINT REPLACEMENT Left 2008   left hip replacement   PORTACATH PLACEMENT  03/10/2018   TONSILLECTOMY  1960    Social History   Socioeconomic History   Marital status: Widowed    Spouse name: Not on file   Number of children: Not on file   Years of education: Not on file   Highest education level: Not on file  Occupational History   Not on file  Tobacco Use   Smoking status: Former Smoker    Packs/day: 1.00    Types: Cigarettes    Quit date: 10/25/2005    Years since quitting: 14.7   Smokeless tobacco: Never Used  Vaping Use   Vaping Use: Never used  Substance and Sexual Activity   Alcohol use: Not Currently    Comment: occasional glass of beer   Drug use: Never   Sexual activity: Not Currently  Other Topics Concern   Not on file  Social History Narrative   Not on file   Social Determinants of Health   Financial Resource Strain:    Difficulty of Paying Living Expenses: Not on file  Food Insecurity:    Worried About Charity fundraiser in the Last Year: Not on file   [employer] of Food in the Last Year: Not on file  Transportation Needs:    Lack of Transportation (Medical): Not on file   Lack of Transportation (Non-Medical): Not on file  Physical Activity:    Days of Exercise per Week: Not on file   Minutes of Exercise per Session: Not on file  Stress:    Feeling of Stress : Not on file  Social Connections:    Frequency of Communication with Friends and Family: Not on file     Frequency of Social Gatherings with Friends and Family: Not on file   Attends Religious Services: Not on file   Active Member of [employer] or Organizations: Not on file   Attends Archivist Meetings: Not on file   Marital Status: Not on file  Intimate Partner Violence:    Fear of Current or Ex-Partner: Not on file   Emotionally Abused: Not on file   Physically Abused: Not on file   Sexually Abused: Not on file    Family History  Problem Relation Age of Onset   Breast cancer Mother 34   CAD Father      Current Outpatient Medications:    albuterol (PROVENTIL HFA) 108 (90 Base) MCG/ACT inhaler, Inhale 2 puffs into the lungs every 6 (six) hours as needed for wheezing or shortness of breath. , Disp: , Rfl:    Cholecalciferol (VITAMIN D3) 1000 units  CAPS, Take 3,000 Units by mouth daily. , Disp: , Rfl:    gabapentin (NEURONTIN) 100 MG capsule, 1po q evening x 4 days, then bid x 4 days, then tid as tolerated, Disp: , Rfl:    latanoprost (XALATAN) 0.005 % ophthalmic solution, Apply 1 drop to eye daily., Disp: , Rfl:    ondansetron (ZOFRAN) 8 MG tablet, One pill every 8 hours as needed for nausea/vomitting. (Patient not taking: Reported on 06/04/2020), Disp: 40 tablet, Rfl: 1   oxyCODONE (OXY IR/ROXICODONE) 5 MG immediate release tablet, Take 1 tablet (5 mg total) by mouth 2 (two) times daily as needed for moderate pain or severe pain., Disp: 45 tablet, Rfl: 0   predniSONE (DELTASONE) 10 MG tablet, Take 1 tablet (10 mg total) by mouth daily with breakfast. (Patient not taking: Reported on 06/27/2020), Disp: 30 tablet, Rfl: 0   predniSONE (DELTASONE) 20 MG tablet, Take 2 tablets (40 mg total) by mouth daily with breakfast., Disp: 20 tablet, Rfl: 0   TRELEGY ELLIPTA 100-62.5-25 MCG/INH AEPB, , Disp: , Rfl:  No current facility-administered medications for this visit.  Facility-Administered Medications Ordered in Other Visits:    sodium chloride flush (NS) 0.9 %  injection 10 mL, 10 mL, Intravenous, PRN, Charlaine Dalton R, MD, 10 mL at 05/02/20 0905   sodium chloride flush (NS) 0.9 % injection 10 mL, 10 mL, Intravenous, PRN, Jacquelin Hawking, NP, 10 mL at 07/22/20 0940  Physical exam: There were no vitals filed for this visit. Physical Exam Constitutional:      Appearance: Normal appearance.  HENT:     Head: Normocephalic and atraumatic.  Eyes:     Pupils: Pupils are equal, round, and reactive to light.  Cardiovascular:     Rate and Rhythm: Normal rate and regular rhythm.     Heart sounds: Normal heart sounds. No murmur heard.   Pulmonary:     Effort: Pulmonary effort is normal.     Breath sounds: Wheezing and rhonchi present.  Abdominal:     General: Bowel sounds are normal. There is no distension.     Palpations: Abdomen is soft.     Tenderness: There is no abdominal tenderness.  Musculoskeletal:        General: Normal range of motion.     Cervical back: Normal range of motion.  Skin:    General: Skin is warm and dry.     Findings: No rash.  Neurological:     Mental Status: She is alert and oriented to person, place, and time.  Psychiatric:        Judgment: Judgment normal.      CMP Latest Ref Rng & Units 07/21/2020  Glucose 70 - 99 mg/dL 133(H)  BUN 8 - 23 mg/dL 27(H)  Creatinine 0.44 - 1.00 mg/dL 1.07(H)  Sodium 135 - 145 mmol/L 134(L)  Potassium 3.5 - 5.1 mmol/L 3.3(L)  Chloride 98 - 111 mmol/L 103  CO2 22 - 32 mmol/L 18(L)  Calcium 8.9 - 10.3 mg/dL 8.9  Total Protein 6.5 - 8.1 g/dL 8.0  Total Bilirubin 0.3 - 1.2 mg/dL 0.9  Alkaline Phos 38 - 126 U/L 82  AST 15 - 41 U/L 14(L)  ALT 0 - 44 U/L 12   CBC Latest Ref Rng & Units 07/21/2020  WBC 4.0 - 10.5 K/uL 12.0(H)  Hemoglobin 12.0 - 15.0 g/dL 9.7(L)  Hematocrit 36 - 46 % 30.0(L)  Platelets 150 - 400 K/uL 376    No images are attached to the encounter.  DG Chest 2 View  Result Date: 07/21/2020 CLINICAL DATA:  Shortness of breath, COPD and lung cancer, not  currently receiving treatments EXAM: CHEST - 2 VIEW COMPARISON:  CT 06/25/2020, radiograph 07/06/2018 FINDINGS: Increasing opacity in cavitary lesion within the right upper lobe with additional nodular opacities and architectural distortion in the aerated portions of the right lung more inferiorly. Chronic interstitial changes and bronchitic features in the otherwise clear left lung are similar to comparison and likely reflect patient's underlying COPD/emphysema better assessed on cross-sectional imaging. Some increasing hazy opacities in the lung bases could reflect some atelectatic change, early edema or infection. Left IJ approach Port-A-Cath tip terminates in the mid to lower SVC. The aorta is calcified. The remaining cardiomediastinal contours are unremarkable. No acute osseous or soft tissue abnormality. Degenerative changes are present in the imaged spine and shoulders. IMPRESSION: 1. Increasing opacity with underlying cavitary lesion within the right upper lobe as well as additional nodular opacities and architectural distortion in the aerated portions of the right lung more inferiorly. Features compatible with patient's known intrathoracic malignancy and pulmonary metastatic disease. 2. Background of coarsened interstitial and bronchitic changes compatible with history of COPD. 3. Some increasing faint, hazy opacities in the lung bases could atelectasis, early edema or infection. 4.  Aortic Atherosclerosis (ICD10-I70.0). Electronically Signed   By: Lovena Le M.D.   On: 07/21/2020 21:03   CT Chest W Contrast  Result Date: 06/25/2020 CLINICAL DATA:  Lung cancer, assess treatment response. Treated with radiation therapy. Currently on chemotherapy. Fatigue. History of breast cancer. EXAM: CT CHEST WITH CONTRAST TECHNIQUE: Multidetector CT imaging of the chest was performed during intravenous contrast administration. CONTRAST:  19m OMNIPAQUE IOHEXOL 300 MG/ML  SOLN COMPARISON:  03/31/2020.  MR thoracic  spine 05/27/2020. FINDINGS: Cardiovascular: Left IJ Port-A-Cath terminates in the SVC. Atherosclerotic calcification of the aorta, aortic valve and coronary arteries. Heart size within normal limits. No pericardial effusion. Mediastinum/Nodes: No pathologically enlarged mediastinal, left hilar or axillary lymph nodes. Esophagus is grossly unremarkable. Lungs/Pleura: Heterogeneous collapse/consolidation in the right upper lobe with extension to the central right middle lobe and right lower lobe. Cavitary appearing component in the superior segment right lower lobe (2/47), as before. Right middle lobe nodule is stable, measuring 1.2 x 1.3 cm (3/75). Multiple right lower lobe nodules are minimally smaller. Index nodule measures 8 x 11 mm (3/85), compared to 11 x 13 mm on 03/31/2020. Areas of volume loss and scarring in the right middle lobe and right lower lobe. No left pleural effusion. Centrilobular emphysema. There is irregularity of the right mainstem bronchus. Airway is otherwise unremarkable. Upper Abdomen: Low-attenuation exophytic mass off segment 3 of the liver has peripheral calcification and is stable in size, 4.9 x 4.9 cm. Additional low-attenuation lesions in the liver measure up to 3.3 x 4.0 cm in the dome, stable. Right adrenal gland is unremarkable. Slight thickening of the left adrenal gland. Visualized portion of the right kidney is unremarkable. Probable 1.4 cm left renal sinus cyst. Visualized portions of the spleen, pancreas, stomach and bowel are grossly unremarkable. Musculoskeletal: Degenerative changes in the spine. No worrisome lytic or sclerotic lesions. Peripherally calcified ovoid low-density lesion in the right breast measures 1.2 x 2.5 cm, unchanged. Healed fractures of the right first second and third anterior ribs. IMPRESSION: 1. Post treatment collapse/consolidation in the upper right hemithorax with a cavitary component in the right lower lobe, similar to 03/31/2020. 2. Right middle  right lower lobe nodules are stable to minimally smaller in size.  3. Low-attenuation lesions in the liver, as described above, unchanged. 4. Aortic atherosclerosis (ICD10-I70.0). Coronary artery calcification. 5. Emphysema (ICD10-J43.9). Electronically Signed   By: Lorin Picket M.D.   On: 06/25/2020 13:32     Assessment and plan- Patient is a 84 y.o. female who presents to symptom management today for follow-up after her visit in the emergency room for presumed pneumonia.  Stage IV lung cancer-currently not on active treatment secondary to poor tolerance.  Pneumonia-Per x-ray from the emergency room.  She was not evaluated by a physician; she left prior to being seen but did have chest x-ray which revealed possible pneumonia.  Dr. Rogue Bussing recommended patient come in for IV antibiotics and IV fluids.  We will go ahead and give 2 g ceftriaxone today while in clinic.  Confusion-secondary to underlying dementia, active infection and likely dehydration.  She has not been eating or drinking well.  We will give IV fluids while in clinic along with IV antibiotics.  Diarrhea-unclear etiology but will check for infection C. difficile and GI panel.  We were able to collect and send off for testing.  C. difficile and GI panel negative.  Can take Imodium for diarrhea.  Plan- IV fluids 2 g ceftriaxone x3 doses Rx prednisone 40 mg daily x5 days. Rx z-pak if stool negative. Stool sample negative  Disposition- Return to clinic tomorrow for additional IV antibiotics Start Z-pak Start Prednisone Can start Imodium for diarrhea  Visit Diagnosis 1. Pneumonia due to infectious organism, unspecified laterality, unspecified part of lung   2. Diarrhea, unspecified type     Patient expressed understanding and was in agreement with this plan. She also understands that She can call clinic at any time with any questions, concerns, or complaints.   Greater than 50% was spent in counseling and coordination  of care with this patient including but not limited to discussion of the relevant topics above (See A&P) including, but not limited to diagnosis and management of acute and chronic medical conditions.   Thank you for allowing me to participate in the care of this very pleasant patient.    Jacquelin Hawking, NP [place] [facility] Cell - [phone] Pager- [phone] 07/22/2020 3:48 PM

## 2020-07-22 NOTE — Telephone Encounter (Signed)
C-diff was negative. Started z-pak and steroids per our discussion. She is coming back to clinic tomorrow for additional IV antibiotics and fluids.  Faythe Casa, NP 07/22/2020 4:18 PM

## 2020-07-22 NOTE — Telephone Encounter (Signed)
Spoke to [place] to ask when he could have his mom at the cancer center this am for a symptom management visit for IVF's and antibiotic. He said he could be here within the hour. Will put pt on schedule.

## 2020-07-23 ENCOUNTER — Other Ambulatory Visit: Payer: Self-pay

## 2020-07-23 ENCOUNTER — Inpatient Hospital Stay: Payer: Medicare Other

## 2020-07-23 ENCOUNTER — Inpatient Hospital Stay
Admission: EM | Admit: 2020-07-23 | Discharge: 2020-07-30 | DRG: 193 | Disposition: A | Discharge status: Skilled Nursing Facility | Payer: Medicare Other | Attending: Internal Medicine | Admitting: Internal Medicine

## 2020-07-23 ENCOUNTER — Ambulatory Visit
Admission: RE | Admit: 2020-07-23 | Discharge: 2020-07-23 | Disposition: A | Discharge status: Home / Self Care | Payer: Medicare Other | Source: Ambulatory Visit | Attending: Oncology | Admitting: Oncology

## 2020-07-23 ENCOUNTER — Other Ambulatory Visit: Payer: Self-pay | Admitting: Internal Medicine

## 2020-07-23 ENCOUNTER — Inpatient Hospital Stay (HOSPITAL_BASED_OUTPATIENT_CLINIC_OR_DEPARTMENT_OTHER): Payer: Medicare Other | Admitting: Oncology

## 2020-07-23 ENCOUNTER — Encounter: Payer: Self-pay | Admitting: Emergency Medicine

## 2020-07-23 ENCOUNTER — Other Ambulatory Visit: Payer: Self-pay | Admitting: Oncology

## 2020-07-23 VITALS — BP 144/72 | HR 93 | Temp 98.7°F | Resp 18 | Wt 135.2 lb

## 2020-07-23 DIAGNOSIS — R9389 Abnormal findings on diagnostic imaging of other specified body structures: Secondary | ICD-10-CM

## 2020-07-23 DIAGNOSIS — G8929 Other chronic pain: Secondary | ICD-10-CM | POA: Diagnosis present

## 2020-07-23 DIAGNOSIS — Z6822 Body mass index (BMI) 22.0-22.9, adult: Secondary | ICD-10-CM

## 2020-07-23 DIAGNOSIS — Z20822 Contact with and (suspected) exposure to covid-19: Secondary | ICD-10-CM | POA: Diagnosis present

## 2020-07-23 DIAGNOSIS — J440 Chronic obstructive pulmonary disease with acute lower respiratory infection: Secondary | ICD-10-CM | POA: Diagnosis present

## 2020-07-23 DIAGNOSIS — Z853 Personal history of malignant neoplasm of breast: Secondary | ICD-10-CM

## 2020-07-23 DIAGNOSIS — Z95828 Presence of other vascular implants and grafts: Secondary | ICD-10-CM

## 2020-07-23 DIAGNOSIS — C3411 Malignant neoplasm of upper lobe, right bronchus or lung: Secondary | ICD-10-CM

## 2020-07-23 DIAGNOSIS — R54 Age-related physical debility: Secondary | ICD-10-CM | POA: Diagnosis present

## 2020-07-23 DIAGNOSIS — Z79899 Other long term (current) drug therapy: Secondary | ICD-10-CM | POA: Diagnosis not present

## 2020-07-23 DIAGNOSIS — C799 Secondary malignant neoplasm of unspecified site: Secondary | ICD-10-CM | POA: Diagnosis present

## 2020-07-23 DIAGNOSIS — R0602 Shortness of breath: Secondary | ICD-10-CM

## 2020-07-23 DIAGNOSIS — Z923 Personal history of irradiation: Secondary | ICD-10-CM

## 2020-07-23 DIAGNOSIS — R64 Cachexia: Secondary | ICD-10-CM | POA: Diagnosis present

## 2020-07-23 DIAGNOSIS — Z8701 Personal history of pneumonia (recurrent): Secondary | ICD-10-CM

## 2020-07-23 DIAGNOSIS — J189 Pneumonia, unspecified organism: Secondary | ICD-10-CM

## 2020-07-23 DIAGNOSIS — E876 Hypokalemia: Secondary | ICD-10-CM | POA: Diagnosis present

## 2020-07-23 DIAGNOSIS — M549 Dorsalgia, unspecified: Secondary | ICD-10-CM | POA: Diagnosis present

## 2020-07-23 DIAGNOSIS — R079 Chest pain, unspecified: Secondary | ICD-10-CM

## 2020-07-23 DIAGNOSIS — R059 Cough, unspecified: Secondary | ICD-10-CM

## 2020-07-23 DIAGNOSIS — I10 Essential (primary) hypertension: Secondary | ICD-10-CM | POA: Diagnosis present

## 2020-07-23 DIAGNOSIS — H409 Unspecified glaucoma: Secondary | ICD-10-CM | POA: Diagnosis present

## 2020-07-23 DIAGNOSIS — E440 Moderate protein-calorie malnutrition: Secondary | ICD-10-CM | POA: Diagnosis present

## 2020-07-23 DIAGNOSIS — J9621 Acute and chronic respiratory failure with hypoxia: Secondary | ICD-10-CM | POA: Diagnosis present

## 2020-07-23 DIAGNOSIS — Z8249 Family history of ischemic heart disease and other diseases of the circulatory system: Secondary | ICD-10-CM

## 2020-07-23 DIAGNOSIS — Z7951 Long term (current) use of inhaled steroids: Secondary | ICD-10-CM | POA: Diagnosis not present

## 2020-07-23 DIAGNOSIS — Z515 Encounter for palliative care: Secondary | ICD-10-CM

## 2020-07-23 DIAGNOSIS — R197 Diarrhea, unspecified: Secondary | ICD-10-CM | POA: Diagnosis present

## 2020-07-23 DIAGNOSIS — E860 Dehydration: Secondary | ICD-10-CM | POA: Diagnosis present

## 2020-07-23 DIAGNOSIS — J9601 Acute respiratory failure with hypoxia: Secondary | ICD-10-CM | POA: Diagnosis present

## 2020-07-23 DIAGNOSIS — E785 Hyperlipidemia, unspecified: Secondary | ICD-10-CM | POA: Diagnosis present

## 2020-07-23 DIAGNOSIS — C3490 Malignant neoplasm of unspecified part of unspecified bronchus or lung: Secondary | ICD-10-CM

## 2020-07-23 DIAGNOSIS — Z66 Do not resuscitate: Secondary | ICD-10-CM | POA: Diagnosis present

## 2020-07-23 DIAGNOSIS — Z7952 Long term (current) use of systemic steroids: Secondary | ICD-10-CM | POA: Diagnosis not present

## 2020-07-23 DIAGNOSIS — Z87891 Personal history of nicotine dependence: Secondary | ICD-10-CM

## 2020-07-23 DIAGNOSIS — I129 Hypertensive chronic kidney disease with stage 1 through stage 4 chronic kidney disease, or unspecified chronic kidney disease: Secondary | ICD-10-CM | POA: Diagnosis present

## 2020-07-23 DIAGNOSIS — Z803 Family history of malignant neoplasm of breast: Secondary | ICD-10-CM

## 2020-07-23 DIAGNOSIS — N189 Chronic kidney disease, unspecified: Secondary | ICD-10-CM | POA: Diagnosis present

## 2020-07-23 DIAGNOSIS — Z880 Allergy status to penicillin: Secondary | ICD-10-CM

## 2020-07-23 LAB — BASIC METABOLIC PANEL
Anion gap: 12 (ref 5–15)
BUN: 31 mg/dL — ABNORMAL HIGH (ref 8–23)
CO2: 20 mmol/L — ABNORMAL LOW (ref 22–32)
Calcium: 8.8 mg/dL — ABNORMAL LOW (ref 8.9–10.3)
Chloride: 106 mmol/L (ref 98–111)
Creatinine, Ser: 0.79 mg/dL (ref 0.44–1.00)
GFR calc Af Amer: >60 mL/min (ref >60)
GFR calc non Af Amer: >60 mL/min (ref >60)
Glucose, Bld: 143 mg/dL — ABNORMAL HIGH (ref 70–99)
Potassium: 3.2 mmol/L — ABNORMAL LOW (ref 3.5–5.1)
Sodium: 138 mmol/L (ref 135–145)

## 2020-07-23 LAB — CBC WITH DIFFERENTIAL/PLATELET
Abs Immature Granulocytes: 0 10*3/uL (ref 0.00–0.07)
Band Neutrophils: 10 %
Basophils Absolute: 0 10*3/uL (ref 0.0–0.1)
Basophils Relative: 0 %
Eosinophils Absolute: 0 10*3/uL (ref 0.0–0.5)
Eosinophils Relative: 0 %
HCT: 27.9 % — ABNORMAL LOW (ref 36.0–46.0)
Hemoglobin: 9.1 g/dL — ABNORMAL LOW (ref 12.0–15.0)
Lymphocytes Relative: 1 %
Lymphs Abs: 0.1 10*3/uL — ABNORMAL LOW (ref 0.7–4.0)
MCH: 28.1 pg (ref 26.0–34.0)
MCHC: 32.6 g/dL (ref 30.0–36.0)
MCV: 86.1 fL (ref 80.0–100.0)
Monocytes Absolute: 0.9 10*3/uL (ref 0.1–1.0)
Monocytes Relative: 7 %
Neutro Abs: 12.2 10*3/uL — ABNORMAL HIGH (ref 1.7–7.7)
Neutrophils Relative %: 82 %
Platelets: 446 10*3/uL — ABNORMAL HIGH (ref 150–400)
RBC: 3.24 MIL/uL — ABNORMAL LOW (ref 3.87–5.11)
RDW: 16.5 % — ABNORMAL HIGH (ref 11.5–15.5)
Smear Review: INCREASED
WBC: 13.3 10*3/uL — ABNORMAL HIGH (ref 4.0–10.5)
nRBC: 0 % (ref 0.0–0.2)

## 2020-07-23 LAB — RESPIRATORY PANEL BY RT PCR (FLU A&B, COVID)
Influenza A by PCR: NEGATIVE
Influenza B by PCR: NEGATIVE
SARS Coronavirus 2 by RT PCR: NEGATIVE

## 2020-07-23 LAB — HEPATIC FUNCTION PANEL
ALT: 33 U/L (ref 0–44)
AST: 56 U/L — ABNORMAL HIGH (ref 15–41)
Albumin: 2.6 g/dL — ABNORMAL LOW (ref 3.5–5.0)
Alkaline Phosphatase: 96 U/L (ref 38–126)
Bilirubin, Direct: 0.1 mg/dL (ref 0.0–0.2)
Indirect Bilirubin: 0.5 mg/dL (ref 0.3–0.9)
Total Bilirubin: 0.6 mg/dL (ref 0.3–1.2)
Total Protein: 8 g/dL (ref 6.5–8.1)

## 2020-07-23 LAB — LACTIC ACID, PLASMA
Lactic Acid, Venous: 1.6 mmol/L (ref 0.5–1.9)
Lactic Acid, Venous: 1.3 mmol/L (ref 0.5–1.9)

## 2020-07-23 LAB — BRAIN NATRIURETIC PEPTIDE: B Natriuretic Peptide: 221.7 pg/mL — ABNORMAL HIGH (ref 0.0–100.0)

## 2020-07-23 LAB — PROTIME-INR
INR: 1 (ref 0.8–1.2)
Prothrombin Time: 13.1 seconds (ref 11.4–15.2)

## 2020-07-23 MED ORDER — SODIUM CHLORIDE 0.9 % IV SOLN
2.0000 g | Freq: Two times a day (BID) | INTRAVENOUS | Status: DC
  Administered 2020-07-24 – 2020-07-27 (×7): 2 g via INTRAVENOUS
  Filled 2020-07-23 (×10): qty 2

## 2020-07-23 MED ORDER — VANCOMYCIN HCL 1500 MG/300ML IV SOLN
1500.0000 mg | Freq: Once | INTRAVENOUS | Status: AC
  Administered 2020-07-23: 1500 mg via INTRAVENOUS
  Filled 2020-07-23: qty 300

## 2020-07-23 MED ORDER — GABAPENTIN 100 MG PO CAPS
100.0000 mg | ORAL_CAPSULE | Freq: Three times a day (TID) | ORAL | Status: DC
  Administered 2020-07-23 – 2020-07-30 (×20): 100 mg via ORAL
  Filled 2020-07-23 (×21): qty 1

## 2020-07-23 MED ORDER — SODIUM CHLORIDE 0.9 % IV SOLN
2.0000 g | Freq: Once | INTRAVENOUS | Status: AC
  Administered 2020-07-23: 2 g via INTRAVENOUS
  Filled 2020-07-23: qty 2

## 2020-07-23 MED ORDER — ACETAMINOPHEN 650 MG RE SUPP: 650.0000 mg | Freq: Four times a day (QID) | RECTAL | Status: DC | PRN

## 2020-07-23 MED ORDER — ALBUTEROL SULFATE (2.5 MG/3ML) 0.083% IN NEBU: 2.5000 mg | INHALATION_SOLUTION | RESPIRATORY_TRACT | Status: DC | PRN

## 2020-07-23 MED ORDER — ONDANSETRON HCL 4 MG PO TABS: 4.0000 mg | ORAL_TABLET | Freq: Four times a day (QID) | ORAL | Status: DC | PRN

## 2020-07-23 MED ORDER — FLUTICASONE-UMECLIDIN-VILANT 100-62.5-25 MCG/INH IN AEPB: 1.0000 | INHALATION_SPRAY | Freq: Every day | RESPIRATORY_TRACT | Status: DC

## 2020-07-23 MED ORDER — SODIUM CHLORIDE 0.9 % IV SOLN
250.0000 mL | INTRAVENOUS | Status: DC | PRN
  Administered 2020-07-24 – 2020-07-25 (×2): 250 mL via INTRAVENOUS

## 2020-07-23 MED ORDER — SODIUM CHLORIDE 0.9% FLUSH
3.0000 mL | Freq: Two times a day (BID) | INTRAVENOUS | Status: DC
  Administered 2020-07-24 – 2020-07-29 (×6): 3 mL via INTRAVENOUS

## 2020-07-23 MED ORDER — ENOXAPARIN SODIUM 40 MG/0.4ML ~~LOC~~ SOLN
40.0000 mg | SUBCUTANEOUS | Status: DC
  Administered 2020-07-23 – 2020-07-29 (×6): 40 mg via SUBCUTANEOUS
  Filled 2020-07-23 (×7): qty 0.4

## 2020-07-23 MED ORDER — GUAIFENESIN-DM 100-10 MG/5ML PO SYRP
5.0000 mL | ORAL_SOLUTION | Freq: Four times a day (QID) | ORAL | Status: DC | PRN
  Filled 2020-07-23: qty 5

## 2020-07-23 MED ORDER — POTASSIUM CHLORIDE CRYS ER 20 MEQ PO TBCR
40.0000 meq | EXTENDED_RELEASE_TABLET | ORAL | Status: AC
  Administered 2020-07-23: 40 meq via ORAL
  Filled 2020-07-23: qty 2

## 2020-07-23 MED ORDER — OXYCODONE HCL 5 MG PO TABS
5.0000 mg | ORAL_TABLET | Freq: Two times a day (BID) | ORAL | Status: DC | PRN
  Administered 2020-07-24: 5 mg via ORAL
  Filled 2020-07-23: qty 1

## 2020-07-23 MED ORDER — ACETAMINOPHEN 325 MG PO TABS
650.0000 mg | ORAL_TABLET | Freq: Four times a day (QID) | ORAL | Status: DC | PRN
  Administered 2020-07-24 – 2020-07-30 (×8): 650 mg via ORAL
  Filled 2020-07-23 (×8): qty 2

## 2020-07-23 MED ORDER — SODIUM CHLORIDE 0.9 % IV SOLN
2.0000 g | Freq: Once | INTRAVENOUS | Status: AC
  Administered 2020-07-23: 2 g via INTRAVENOUS
  Filled 2020-07-23: qty 20

## 2020-07-23 MED ORDER — VANCOMYCIN HCL IN DEXTROSE 1-5 GM/200ML-% IV SOLN: 1000.0000 mg | Freq: Once | INTRAVENOUS | Status: DC

## 2020-07-23 MED ORDER — IOHEXOL 300 MG/ML  SOLN: 100.0000 mL | Freq: Once | INTRAMUSCULAR | Status: DC | PRN

## 2020-07-23 MED ORDER — HYDRALAZINE HCL 20 MG/ML IJ SOLN: 10.0000 mg | Freq: Four times a day (QID) | INTRAMUSCULAR | Status: DC | PRN

## 2020-07-23 MED ORDER — IPRATROPIUM BROMIDE 0.02 % IN SOLN
0.5000 mg | Freq: Four times a day (QID) | RESPIRATORY_TRACT | Status: DC
  Administered 2020-07-23 – 2020-07-25 (×6): 0.5 mg via RESPIRATORY_TRACT
  Filled 2020-07-23 (×6): qty 2.5

## 2020-07-23 MED ORDER — LATANOPROST 0.005 % OP SOLN
1.0000 [drp] | Freq: Every day | OPHTHALMIC | Status: DC
  Administered 2020-07-24 – 2020-07-29 (×5): 1 [drp] via OPHTHALMIC
  Filled 2020-07-23: qty 2.5

## 2020-07-23 MED ORDER — POLYETHYLENE GLYCOL 3350 17 G PO PACK: 17.0000 g | PACK | Freq: Every day | ORAL | Status: DC | PRN

## 2020-07-23 MED ORDER — ALBUTEROL SULFATE HFA 108 (90 BASE) MCG/ACT IN AERS
2.0000 | INHALATION_SPRAY | Freq: Four times a day (QID) | RESPIRATORY_TRACT | Status: DC | PRN
  Filled 2020-07-23: qty 6.7

## 2020-07-23 MED ORDER — SODIUM CHLORIDE 0.9% FLUSH
10.0000 mL | INTRAVENOUS | Status: DC | PRN
  Administered 2020-07-23: 10 mL via INTRAVENOUS
  Filled 2020-07-23: qty 10

## 2020-07-23 MED ORDER — GUAIFENESIN ER 600 MG PO TB12
600.0000 mg | ORAL_TABLET | Freq: Two times a day (BID) | ORAL | Status: DC
  Administered 2020-07-23 – 2020-07-30 (×14): 600 mg via ORAL
  Filled 2020-07-23 (×15): qty 1

## 2020-07-23 MED ORDER — LACTATED RINGERS IV SOLN: INTRAVENOUS | Status: DC

## 2020-07-23 MED ORDER — SODIUM CHLORIDE 0.9 % IV SOLN: 10.0000 mg | Freq: Once | INTRAVENOUS | Status: DC

## 2020-07-23 MED ORDER — HEPARIN SOD (PORK) LOCK FLUSH 100 UNIT/ML IV SOLN
500.0000 [IU] | Freq: Once | INTRAVENOUS | Status: DC
  Filled 2020-07-23: qty 5

## 2020-07-23 MED ORDER — LACTATED RINGERS IV BOLUS (SEPSIS)
1000.0000 mL | Freq: Once | INTRAVENOUS | Status: AC
  Administered 2020-07-23: 1000 mL via INTRAVENOUS

## 2020-07-23 MED ORDER — SODIUM CHLORIDE 0.9 % IV SOLN
Freq: Once | INTRAVENOUS | Status: AC
  Filled 2020-07-23: qty 250

## 2020-07-23 MED ORDER — IOHEXOL 350 MG/ML SOLN
75.0000 mL | Freq: Once | INTRAVENOUS | Status: AC | PRN
  Administered 2020-07-23: 60 mL via INTRAVENOUS

## 2020-07-23 MED ORDER — SODIUM CHLORIDE 0.9% FLUSH: 3.0000 mL | INTRAVENOUS | Status: DC | PRN

## 2020-07-23 MED ORDER — VITAMIN D 25 MCG (1000 UNIT) PO TABS
2000.0000 [IU] | ORAL_TABLET | Freq: Every day | ORAL | Status: DC
  Administered 2020-07-23 – 2020-07-30 (×7): 2000 [IU] via ORAL
  Filled 2020-07-23 (×8): qty 2

## 2020-07-23 MED ORDER — ONDANSETRON HCL 4 MG/2ML IJ SOLN: 4.0000 mg | Freq: Four times a day (QID) | INTRAMUSCULAR | Status: DC | PRN

## 2020-07-23 MED ORDER — VANCOMYCIN HCL 500 MG/100ML IV SOLN
500.0000 mg | Freq: Two times a day (BID) | INTRAVENOUS | Status: DC
  Administered 2020-07-24: 500 mg via INTRAVENOUS
  Filled 2020-07-23 (×3): qty 100

## 2020-07-23 MED ORDER — DEXAMETHASONE SODIUM PHOSPHATE 10 MG/ML IJ SOLN
10.0000 mg | Freq: Once | INTRAMUSCULAR | Status: AC
  Administered 2020-07-23: 10 mg via INTRAVENOUS

## 2020-07-23 MED ORDER — IOHEXOL 350 MG/ML SOLN
75.0000 mL | Freq: Once | INTRAVENOUS | Status: AC | PRN
  Administered 2020-07-23: 75 mL via INTRAVENOUS

## 2020-07-23 NOTE — ED Notes (Signed)
Oxygen sats 94% on 2 liters 

## 2020-07-23 NOTE — Progress Notes (Signed)
Symptom Management Consult note [facility]  [phone] [phone]  Patient Care Team: Leonel Ramsay, MD as PCP - General (Infectious Diseases) Telford Nab, RN as Registered Nurse [name], MD as Medical Oncologist (Medical Oncology) [name], MD as Referring Physician (Physical Medicine and Rehabilitation)   Name of the patient: Brenda Parker  [id number]  01-Jul-1936   Date of visit: 07/23/2020   Diagnosis-lung cancer  Chief complaint/ Reason for visit-pneumonia  Heme/Onc history:  Oncology History Overview Note  # April 2019- RUL LUNG CANCER-non-small cell; favor squamous cell; PET scan T4N0 [right upper lobe mass involving[mediastinal/blood results] vs small pleural effusion [?M1]- CARBO-TAXOL- RT;   #October 2020-CT progressive subcentimeter right-sided lung lesions;  # NOV 17th 2020- KEYTRUDA q3 W  # 24th FEB 2021- Carbo-Taxol weekly; # 5 treatments.  March 30-2021 progressive disease liver lesion; stable lung.  #February 01, 2020-gemcitabine [800 mg/m]  # Hx of Right breast ca [s/p lumpec; RT; No chemo; "pill" x5 years]  # COPD/Hx of smoking  # PALLIATIVE CARE: 12/19/2019- DELCINED  MOLECULAR TESTING/OMNISEQ: PDL-1 25% [TPS; TMB-H; No targets**]  --------------------------------------------------    DIAGNOSIS: [ April 2019]SQUAMOUS CELL LUNG CA  STAGE: IV/Recurrent ;GOALS: PALLIATIVE  CURRENT/MOST RECENT THERAPY-  gemcitabine [C] .     Primary cancer of right upper lobe of lung ([place])  03/13/2018 - 05/15/2018 Chemotherapy   The patient had palonosetron (ALOXI) injection 0.25 mg, 0.25 mg, Intravenous,  Once, 9 of 9 cycles Administration: 0.25 mg (03/13/2018), 0.25 mg (04/10/2018), 0.25 mg (04/17/2018), 0.25 mg (03/21/2018), 0.25 mg (04/25/2018), 0.25 mg (03/27/2018), 0.25 mg (04/03/2018), 0.25 mg (05/01/2018), 0.25 mg (05/08/2018) CARBOplatin (PARAPLATIN) 130 mg in sodium chloride 0.9 % 250 mL chemo  infusion, 130 mg (100 % of original dose 131.4 mg), Intravenous,  Once, 9 of 9 cycles Dose modification:   (original dose 131.4 mg, Cycle 1) Administration: 130 mg (03/13/2018), 130 mg (04/10/2018), 130 mg (04/17/2018), 130 mg (03/21/2018), 130 mg (04/25/2018), 130 mg (03/27/2018), 130 mg (04/03/2018), 130 mg (05/01/2018), 130 mg (05/08/2018) PACLitaxel (TAXOL) 78 mg in sodium chloride 0.9 % 250 mL chemo infusion (</= 100m/m2), 45 mg/m2 = 78 mg, Intravenous,  Once, 9 of 9 cycles Administration: 78 mg (03/13/2018), 78 mg (04/10/2018), 78 mg (04/17/2018), 78 mg (03/21/2018), 78 mg (04/25/2018), 78 mg (03/27/2018), 78 mg (04/03/2018), 78 mg (05/01/2018), 78 mg (05/08/2018)  for chemotherapy treatment.    09/12/2019 - 12/04/2019 Chemotherapy   The patient had pembrolizumab (KEYTRUDA) 200 mg in sodium chloride 0.9 % 50 mL chemo infusion, 200 mg, Intravenous, Once, 4 of 6 cycles Administration: 200 mg (09/12/2019), 200 mg (10/03/2019), 200 mg (10/24/2019), 200 mg (11/14/2019)  for chemotherapy treatment.    12/19/2019 - 01/24/2020 Chemotherapy   The patient had palonosetron (ALOXI) injection 0.25 mg, 0.25 mg, Intravenous,  Once, 5 of 8 cycles Administration: 0.25 mg (12/19/2019), 0.25 mg (12/28/2019), 0.25 mg (01/18/2020), 0.25 mg (01/04/2020), 0.25 mg (01/11/2020) CARBOplatin (PARAPLATIN) 120 mg in sodium chloride 0.9 % 100 mL chemo infusion, 120 mg (100 % of original dose 122 mg), Intravenous,  Once, 5 of 8 cycles Dose modification:   (original dose 122 mg, Cycle 1) Administration: 120 mg (12/19/2019), 120 mg (12/28/2019), 120 mg (01/18/2020), 120 mg (01/04/2020), 120 mg (01/11/2020) PACLitaxel (TAXOL) 138 mg in sodium chloride 0.9 % 250 mL chemo infusion (</= 825mm2), 80 mg/m2 = 138 mg, Intravenous,  Once, 5 of 8 cycles Administration: 138 mg (12/19/2019), 138 mg (12/28/2019), 138 mg (01/18/2020), 138 mg (01/04/2020), 138  mg (01/11/2020)  for chemotherapy treatment.    02/01/2020 -  Chemotherapy   The patient had gemcitabine (GEMZAR) 1,400 mg in  sodium chloride 0.9 % 250 mL chemo infusion, 1,368 mg (100 % of original dose 800 mg/m2), Intravenous,  Once, 3 of 4 cycles Dose modification: 800 mg/m2 (original dose 800 mg/m2, Cycle 1, Reason: Provider Judgment) Administration: 1,400 mg (02/01/2020), 1,400 mg (02/08/2020), 1,400 mg (02/22/2020), 1,400 mg (02/29/2020), 1,400 mg (03/21/2020), 1,400 mg (03/14/2020)  for chemotherapy treatment.     Interval history-Ms. Bauer is an 84 year old female with past medical history significant for hypertension, psoriasis, hyperlipidemia, liver lesion who was diagnosed with right upper lobe lung cancer in April 2019.  She has had several lines of treatment secondary to progression of disease.  She is under the care of Dr. Rogue Bussing and currently on chemotherapy treatment with gemcitabine.  She was last seen by Dr. Rogue Bussing on 06/27/2020 for treatment was held secondary to poor tolerance.  Imaging from 06/25/2020 showed stable disease.  She is scheduled to follow-up in approximately 6 weeks.  Patient presented Monday night to the emergency room for shortness of breath and cough.  She left before being seen by a physician but did have a negative Covid test, normal sinus rhythm on EKG and a chest x-ray that showed increasing faint hazy opacity in lung bases which could represent atelectasis or early edema or infection.  Since she had a left the emergency room, Dr. Rogue Bussing recommended she come in to be evaluated in symptom management this morning for IV fluids and IV antibiotics.   She received 750 mL of IV fluids and 2 g of Rocephin IV.  She is also experiencing diarrhea-collected C. difficile/GI panel negative.  She was also given a prescription for a Z-Pak and a few days worth of prednisone.  Prescription sent.  She was instructed to come back today for additional IV fluids and IV antibiotics.  Today, patient returns with her son.  Symptoms have been persistent throughout the evening last night.  Son states he feels she  is more confused.  Continues to have cough with sputum production.  Diarrhea has improved with the use of Imodium  ECOG FS:2 - Symptomatic, <50% confined to bed  Review of systems- Review of Systems  Constitutional: Negative.  Negative for chills, fever, malaise/fatigue and weight loss.  HENT: Negative for congestion, ear pain and tinnitus.   Eyes: Negative.  Negative for blurred vision and double vision.  Respiratory: Positive for cough and shortness of breath. Negative for sputum production.   Cardiovascular: Negative.  Negative for chest pain, palpitations and leg swelling.  Gastrointestinal: Positive for diarrhea. Negative for abdominal pain, constipation, nausea and vomiting.  Genitourinary: Negative for dysuria, frequency and urgency.  Musculoskeletal: Negative for back pain and falls.  Skin: Negative.  Negative for rash.  Neurological: Positive for weakness. Negative for headaches.  Endo/Heme/Allergies: Negative.  Does not bruise/bleed easily.  Psychiatric/Behavioral: Positive for memory loss. Negative for depression. The patient is not nervous/anxious and does not have insomnia.      Current treatment-currently on hold secondary to progression and intolerance  Allergies  Allergen Reactions  . Penicillins Rash    Patient had a rash after an injection in 1973.  Unsure if taken orally will cause any reaction  Has patient had a PCN reaction causing immediate rash, facial/tongue/throat swelling, SOB or lightheadedness with hypotension: Yes Has patient had a PCN reaction causing severe rash involving mucus membranes or skin necrosis: No Has patient had  a PCN reaction that required hospitalization: No Has patient had a PCN reaction occurring within the last 10 years: No      Past Medical History:  Diagnosis Date  . Benign liver cyst   . Breast cancer ([place]) 2005   right breast  . COPD (chronic obstructive pulmonary disease) ([place])   . Dyspnea   . Glaucoma   . Hemoptysis 2019    . Hypertension    Not on medication for htn. patient denies this  . Lung mass 01/2018  . Personal history of radiation therapy      Past Surgical History:  Procedure Laterality Date  . BREAST EXCISIONAL BIOPSY Right 2005   positive, radiation  . BREAST LUMPECTOMY Right 03/2004  . CHOLECYSTECTOMY  1988  . COLONOSCOPY  2012  . ENDOBRONCHIAL ULTRASOUND N/A 02/21/2018   Procedure: ENDOBRONCHIAL ULTRASOUND;  Surgeon: Laverle Hobby, MD;  Location: [facility] ORS;  Service: Pulmonary;  Laterality: N/A;  . EYE SURGERY Bilateral 2009,2010   cataract extractions with lens implant  . GALLBLADDER SURGERY Bilateral   . IR FLUORO GUIDE PORT INSERTION RIGHT  03/10/2018  . JOINT REPLACEMENT Left 2008   left hip replacement  . PORTACATH PLACEMENT  03/10/2018  . TONSILLECTOMY  1960    Social History   Socioeconomic History  . Marital status: Widowed    Spouse name: Not on file  . Number of children: Not on file  . Years of education: Not on file  . Highest education level: Not on file  Occupational History  . Not on file  Tobacco Use  . Smoking status: Former Smoker    Packs/day: 1.00    Types: Cigarettes    Quit date: 10/25/2005    Years since quitting: 14.7  . Smokeless tobacco: Never Used  Vaping Use  . Vaping Use: Never used  Substance and Sexual Activity  . Alcohol use: Not Currently    Comment: occasional glass of beer  . Drug use: Never  . Sexual activity: Not Currently  Other Topics Concern  . Not on file  Social History Narrative  . Not on file   Social Determinants of Health   Financial Resource Strain:   . Difficulty of Paying Living Expenses: Not on file  Food Insecurity:   . Worried About Charity fundraiser in the Last Year: Not on file  . Ran Out of Food in the Last Year: Not on file  Transportation Needs:   . Lack of Transportation (Medical): Not on file  . Lack of Transportation (Non-Medical): Not on file  Physical Activity:   . Days of Exercise per  Week: Not on file  . Minutes of Exercise per Session: Not on file  Stress:   . Feeling of Stress : Not on file  Social Connections:   . Frequency of Communication with Friends and Family: Not on file  . Frequency of Social Gatherings with Friends and Family: Not on file  . Attends Religious Services: Not on file  . Active Member of Clubs or Organizations: Not on file  . Attends Archivist Meetings: Not on file  . Marital Status: Not on file  Intimate Partner Violence:   . Fear of Current or Ex-Partner: Not on file  . Emotionally Abused: Not on file  . Physically Abused: Not on file  . Sexually Abused: Not on file    Family History  Problem Relation Age of Onset  . Breast cancer Mother 76  . CAD Father  Current Outpatient Medications:  .  albuterol (PROVENTIL HFA) 108 (90 Base) MCG/ACT inhaler, Inhale 2 puffs into the lungs every 6 (six) hours as needed for wheezing or shortness of breath. , Disp: , Rfl:  .  azithromycin (ZITHROMAX) 250 MG tablet, Take 2 tablets on day 1 (569m). Take 1 tablet (250 mg) days 2-5., Disp: 6 each, Rfl: 0 .  Cholecalciferol (VITAMIN D3) 1000 units CAPS, Take 3,000 Units by mouth daily. , Disp: , Rfl:  .  gabapentin (NEURONTIN) 100 MG capsule, 1po q evening x 4 days, then bid x 4 days, then tid as tolerated, Disp: , Rfl:  .  latanoprost (XALATAN) 0.005 % ophthalmic solution, Apply 1 drop to eye daily., Disp: , Rfl:  .  ondansetron (ZOFRAN) 8 MG tablet, One pill every 8 hours as needed for nausea/vomitting. (Patient not taking: Reported on 06/04/2020), Disp: 40 tablet, Rfl: 1 .  oxyCODONE (OXY IR/ROXICODONE) 5 MG immediate release tablet, Take 1 tablet (5 mg total) by mouth 2 (two) times daily as needed for moderate pain or severe pain., Disp: 45 tablet, Rfl: 0 .  predniSONE (DELTASONE) 10 MG tablet, Take 1 tablet (10 mg total) by mouth daily with breakfast. (Patient not taking: Reported on 06/27/2020), Disp: 30 tablet, Rfl: 0 .  predniSONE  (DELTASONE) 20 MG tablet, Take 2 tablets (40 mg total) by mouth daily with breakfast., Disp: 20 tablet, Rfl: 0 .  TRELEGY ELLIPTA 100-62.5-25 MCG/INH AEPB, , Disp: , Rfl:  No current facility-administered medications for this visit.  Facility-Administered Medications Ordered in Other Visits:  .  heparin lock flush 100 unit/mL, 500 Units, Intravenous, Once, Monai Hindes E, NP .  iohexol (OMNIPAQUE) 350 MG/ML injection 75 mL, 75 mL, Intravenous, Once PRN, BFaythe CasaE, NP .  sodium chloride flush (NS) 0.9 % injection 10 mL, 10 mL, Intravenous, PRN, BCammie Sickle MD, 10 mL at 05/02/20 0905 .  sodium chloride flush (NS) 0.9 % injection 10 mL, 10 mL, Intravenous, PRN, BFaythe CasaE, NP, 10 mL at 07/23/20 0915 .  sodium chloride flush (NS) 0.9 % injection 10 mL, 10 mL, Intravenous, PRN, BFaythe CasaE, NP, 10 mL at 07/23/20 1255  Physical exam: There were no vitals filed for this visit. Physical Exam Constitutional:      Appearance: Normal appearance.  HENT:     Head: Normocephalic and atraumatic.  Eyes:     Pupils: Pupils are equal, round, and reactive to light.  Cardiovascular:     Rate and Rhythm: Normal rate and regular rhythm.     Heart sounds: Normal heart sounds. No murmur heard.   Pulmonary:     Effort: Pulmonary effort is normal.     Breath sounds: Wheezing and rhonchi present.  Abdominal:     General: Bowel sounds are normal. There is no distension.     Palpations: Abdomen is soft.     Tenderness: There is no abdominal tenderness.  Musculoskeletal:        General: Normal range of motion.     Cervical back: Normal range of motion.  Skin:    General: Skin is warm and dry.     Findings: No rash.  Neurological:     Mental Status: She is alert and oriented to person, place, and time.  Psychiatric:        Judgment: Judgment normal.      CMP Latest Ref Rng & Units 07/23/2020  Glucose 70 - 99 mg/dL 143(H)  BUN 8 - 23 mg/dL 31(H)  Creatinine 0.44 - 1.00  mg/dL 0.79  Sodium 135 - 145 mmol/L 138  Potassium 3.5 - 5.1 mmol/L 3.2(L)  Chloride 98 - 111 mmol/L 106  CO2 22 - 32 mmol/L 20(L)  Calcium 8.9 - 10.3 mg/dL 8.8(L)  Total Protein 6.5 - 8.1 g/dL -  Total Bilirubin 0.3 - 1.2 mg/dL -  Alkaline Phos 38 - 126 U/L -  AST 15 - 41 U/L -  ALT 0 - 44 U/L -   CBC Latest Ref Rng & Units 07/23/2020  WBC 4.0 - 10.5 K/uL 13.3(H)  Hemoglobin 12.0 - 15.0 g/dL 9.1(L)  Hematocrit 36 - 46 % 27.9(L)  Platelets 150 - 400 K/uL 446(H)    No images are attached to the encounter.  DG Chest 2 View  Result Date: 07/21/2020 CLINICAL DATA:  Shortness of breath, COPD and lung cancer, not currently receiving treatments EXAM: CHEST - 2 VIEW COMPARISON:  CT 06/25/2020, radiograph 07/06/2018 FINDINGS: Increasing opacity in cavitary lesion within the right upper lobe with additional nodular opacities and architectural distortion in the aerated portions of the right lung more inferiorly. Chronic interstitial changes and bronchitic features in the otherwise clear left lung are similar to comparison and likely reflect patient's underlying COPD/emphysema better assessed on cross-sectional imaging. Some increasing hazy opacities in the lung bases could reflect some atelectatic change, early edema or infection. Left IJ approach Port-A-Cath tip terminates in the mid to lower SVC. The aorta is calcified. The remaining cardiomediastinal contours are unremarkable. No acute osseous or soft tissue abnormality. Degenerative changes are present in the imaged spine and shoulders. IMPRESSION: 1. Increasing opacity with underlying cavitary lesion within the right upper lobe as well as additional nodular opacities and architectural distortion in the aerated portions of the right lung more inferiorly. Features compatible with patient's known intrathoracic malignancy and pulmonary metastatic disease. 2. Background of coarsened interstitial and bronchitic changes compatible with history of COPD. 3.  Some increasing faint, hazy opacities in the lung bases could atelectasis, early edema or infection. 4.  Aortic Atherosclerosis (ICD10-I70.0). Electronically Signed   By: Lovena Le M.D.   On: 07/21/2020 21:03   CT Chest W Contrast  Result Date: 06/25/2020 CLINICAL DATA:  Lung cancer, assess treatment response. Treated with radiation therapy. Currently on chemotherapy. Fatigue. History of breast cancer. EXAM: CT CHEST WITH CONTRAST TECHNIQUE: Multidetector CT imaging of the chest was performed during intravenous contrast administration. CONTRAST:  51m OMNIPAQUE IOHEXOL 300 MG/ML  SOLN COMPARISON:  03/31/2020.  MR thoracic spine 05/27/2020. FINDINGS: Cardiovascular: Left IJ Port-A-Cath terminates in the SVC. Atherosclerotic calcification of the aorta, aortic valve and coronary arteries. Heart size within normal limits. No pericardial effusion. Mediastinum/Nodes: No pathologically enlarged mediastinal, left hilar or axillary lymph nodes. Esophagus is grossly unremarkable. Lungs/Pleura: Heterogeneous collapse/consolidation in the right upper lobe with extension to the central right middle lobe and right lower lobe. Cavitary appearing component in the superior segment right lower lobe (2/47), as before. Right middle lobe nodule is stable, measuring 1.2 x 1.3 cm (3/75). Multiple right lower lobe nodules are minimally smaller. Index nodule measures 8 x 11 mm (3/85), compared to 11 x 13 mm on 03/31/2020. Areas of volume loss and scarring in the right middle lobe and right lower lobe. No left pleural effusion. Centrilobular emphysema. There is irregularity of the right mainstem bronchus. Airway is otherwise unremarkable. Upper Abdomen: Low-attenuation exophytic mass off segment 3 of the liver has peripheral calcification and is stable in size, 4.9 x 4.9 cm. Additional low-attenuation lesions in  the liver measure up to 3.3 x 4.0 cm in the dome, stable. Right adrenal gland is unremarkable. Slight thickening of the left  adrenal gland. Visualized portion of the right kidney is unremarkable. Probable 1.4 cm left renal sinus cyst. Visualized portions of the spleen, pancreas, stomach and bowel are grossly unremarkable. Musculoskeletal: Degenerative changes in the spine. No worrisome lytic or sclerotic lesions. Peripherally calcified ovoid low-density lesion in the right breast measures 1.2 x 2.5 cm, unchanged. Healed fractures of the right first second and third anterior ribs. IMPRESSION: 1. Post treatment collapse/consolidation in the upper right hemithorax with a cavitary component in the right lower lobe, similar to 03/31/2020. 2. Right middle right lower lobe nodules are stable to minimally smaller in size. 3. Low-attenuation lesions in the liver, as described above, unchanged. 4. Aortic atherosclerosis (ICD10-I70.0). Coronary artery calcification. 5. Emphysema (ICD10-J43.9). Electronically Signed   By: Lorin Picket M.D.   On: 06/25/2020 13:32     Assessment and plan- Patient is a 84 y.o. female who presents to symptom management today for follow-up and additional IV fluids and antibiotics.  Stage IV lung cancer-currently not on active treatment secondary to poor tolerance.  Pneumonia-Per x-ray from the emergency room.  Returns today for second dose of IV ceftriaxone.  On assessment, patient appears more dyspneic oxygen saturations between 89 and 90% on room air.  Has home O2 but wears it intermittently.  Spoke with Dr. Rogue Bussing who recommends getting imaging CT angio PE protocol.  Confusion-secondary to underlying dementia, active infection, dehydration and hypoxia.  She has not been eating or drinking well.  We will give IV fluids while in clinic along with IV antibiotics.  Encouraged her to wear her oxygen at home.  Diarrhea-C. difficile and GI panel negative-improved with Imodium.  Hypoxia-O2 saturations low-88 to 91% on room air.  Patient son states those are her baseline O2 saturations at home.  She does  have oxygen but wears it intermittently.  Encouraged her to wear her oxygen all day given possible pneumonia.   Spoke at length with her son.  Patient is unwilling to be seen in the emergency room today.  She waited for 9+ hours the other day and refuses to go back.  I did let her know that if her breathing gets worse or her oxygen saturations drop she will need to taken to the emergency room or to call 911.  They both understood.  Will call with results from CT scan today.  Plan- IV fluids 2 g ceftriaxone  10 mg dexamethasone Start prednisone tomorrow 40 mg daily x5 days. Start z-pak negative   Disposition- Stat CTA rule out pulmonary embolism and/or worsening disease Return to clinic tomorrow for additional IV antibiotics Start Z-pak Start Prednisone Encourage hydration. Can start Imodium for diarrhea  Visit Diagnosis 1. Primary cancer of right upper lobe of lung ([place])   2. Pneumonia due to infectious organism, unspecified laterality, unspecified part of lung     Patient expressed understanding and was in agreement with this plan. She also understands that She can call clinic at any time with any questions, concerns, or complaints.   Greater than 50% was spent in counseling and coordination of care with this patient including but not limited to discussion of the relevant topics above (See A&P) including, but not limited to diagnosis and management of acute and chronic medical conditions.   Thank you for allowing me to participate in the care of this very pleasant patient.    Wandra Feinstein  Quay Burow, NP [facility] Cell - [phone] Pager- [phone] 07/23/2020 2:24 PM

## 2020-07-23 NOTE — H&P (Signed)
[facility] History and Physical   Patient: Brenda Parker RCV:[id number]   PCP: Leonel Ramsay, MD DOB: Apr 11, 1936   DOA: 07/23/2020   DOS: 07/23/2020   DOS: the patient was seen and examined on 07/23/2020 Patient coming from: The patient is coming from Home  Chief Complaint: Shortness of breath  HPI: Brenda Parker is a 84 y.o. female with Past medical history of lung cancer status post chemotherapy, COPD, HTN, HLD, CKD presented at [facility] ED due to worsening of shortness of breath. Patient was treated as an outpatient with antibiotics for 2 days but her shortness of breath got worse and she was hypoxic. Patient has oxygen at home but she was not using it. Patient denied any chest pain or palpitations, denied any abdominal pain, no nausea vomiting or diarrhea.   ED Course: Hypoxic respiratory failure, patient was saturating 89% on room air, BP neck with low-grade fever, lactic acid within normal range, leukocytosis, COVID-19 and flu negative, CT angio chest ruled out PE, positive for cancer and lymphadenopathy, liver lesions, bilateral groundglass opacities.   Review of Systems: as mentioned in the history of present illness.  All other systems reviewed and are negative.  Past Medical History:  Diagnosis Date  . Benign liver cyst   . Breast cancer ([place]) 2005   right breast  . COPD (chronic obstructive pulmonary disease) ([place])   . Dyspnea   . Glaucoma   . Hemoptysis 2019  . Hypertension    Not on medication for htn. patient denies this  . Lung mass 01/2018  . Personal history of radiation therapy    Past Surgical History:  Procedure Laterality Date  . BREAST EXCISIONAL BIOPSY Right 2005   positive, radiation  . BREAST LUMPECTOMY Right 03/2004  . CHOLECYSTECTOMY  1988  . COLONOSCOPY  2012  . ENDOBRONCHIAL ULTRASOUND N/A 02/21/2018   Procedure: ENDOBRONCHIAL ULTRASOUND;  Surgeon: Laverle Hobby, MD;  Location: [facility] ORS;  Service: Pulmonary;  Laterality: N/A;  . EYE  SURGERY Bilateral 2009,2010   cataract extractions with lens implant  . GALLBLADDER SURGERY Bilateral   . IR FLUORO GUIDE PORT INSERTION RIGHT  03/10/2018  . JOINT REPLACEMENT Left 2008   left hip replacement  . PORTACATH PLACEMENT  03/10/2018  . TONSILLECTOMY  1960   Social History:  reports that she quit smoking about 14 years ago. Her smoking use included cigarettes. She smoked 1.00 pack per day. She has never used smokeless tobacco. She reports previous alcohol use. She reports that she does not use drugs.  Allergies  Allergen Reactions  . Penicillins Rash    Patient had a rash after an injection in 1973.  Unsure if taken orally will cause any reaction  Has patient had a PCN reaction causing immediate rash, facial/tongue/throat swelling, SOB or lightheadedness with hypotension: Yes Has patient had a PCN reaction causing severe rash involving mucus membranes or skin necrosis: No Has patient had a PCN reaction that required hospitalization: No Has patient had a PCN reaction occurring within the last 10 years: No      Family history reviewed and not pertinent Family History  Problem Relation Age of Onset  . Breast cancer Mother 16  . CAD Father      Prior to Admission medications   Medication Sig Start Date End Date Taking? Authorizing Provider  albuterol (PROVENTIL HFA) 108 (90 Base) MCG/ACT inhaler Inhale 2 puffs into the lungs every 6 (six) hours as needed for wheezing or shortness of breath.  09/28/17 08/30/25  Yes [provider]  Cholecalciferol (VITAMIN D3) 1000 units CAPS Take 2,000 Units by mouth daily.    Yes [provider]  fluocinolone (SYNALAR) 0.01 % external solution Apply topically 2 (two) times daily.   Yes [provider]  gabapentin (NEURONTIN) 100 MG capsule Take 100 mg by mouth 3 (three) times daily.  06/17/20  Yes [provider]  latanoprost (XALATAN) 0.005 % ophthalmic solution Place 1 drop into both eyes at bedtime.    Yes  [provider]  ondansetron (ZOFRAN) 8 MG tablet Take 8 mg by mouth every 8 (eight) hours as needed for nausea or vomiting.   Yes [provider]  oxyCODONE (OXY IR/ROXICODONE) 5 MG immediate release tablet Take 1 tablet (5 mg total) by mouth 2 (two) times daily as needed for moderate pain or severe pain. 07/02/20  Yes [name], MD  TRELEGY ELLIPTA 100-62.5-25 MCG/INH AEPB Take 1 puff by mouth daily.  07/03/19  Yes [provider]  azithromycin (ZITHROMAX) 250 MG tablet Take 2 tablets on day 1 (500mg ). Take 1 tablet (250 mg) days 2-5. 07/22/20   Jacquelin Hawking, NP  predniSONE (DELTASONE) 20 MG tablet Take 2 tablets (40 mg total) by mouth daily with breakfast. Patient taking differently: Take 40 mg by mouth 2 (two) times daily with a meal.  07/22/20   Jacquelin Hawking, NP    Physical Exam: Vitals:   07/23/20 2100 07/23/20 2130 07/23/20 2200 07/23/20 2230  BP: (!) 154/91 (!) 155/81 (!) 143/81 (!) 151/77  Pulse: 97 100 94 87  Resp: (!) 29 (!) 32 (!) 34 (!) 35  Temp:      TempSrc:      SpO2: 98% 94% 95% 97%  Weight:      Height:        General: alert and oriented to person and place. Appear in moderate distress, affect appropriate Eyes: PERRLA, Conjunctiva normal ENT: Oral Mucosa Clear, moist  Neck: no JVD, no Abnormal Mass Or lumps Cardiovascular: S1 and S2 Present, no Murmur, peripheral pulses symmetrical Respiratory: increased respiratory effort, signs of accessory muscle use, Crackles bilaterally, and mild wheezes Abdomen: Bowel Sound present, Soft and no tenderness, no hernia Skin: no rashes  Extremities: no Pedal edema, no calf tenderness Neurologic: without any new focal findings Gait not checked due to patient safety concerns  Data Reviewed: I have personally reviewed and interpreted labs, imaging as discussed below.  CBC: Recent Labs  Lab 07/21/20 2018 07/23/20 1134  WBC 12.0* 13.3*  NEUTROABS  --  12.2*  HGB 9.7* 9.1*  HCT  30.0* 27.9*  MCV 87.7 86.1  PLT 376 244*   Basic Metabolic Panel: Recent Labs  Lab 07/21/20 2018 07/23/20 1134  NA 134* 138  K 3.3* 3.2*  CL 103 106  CO2 18* 20*  GLUCOSE 133* 143*  BUN 27* 31*  CREATININE 1.07* 0.79  CALCIUM 8.9 8.8*   GFR: Estimated Creatinine Clearance: 46.8 mL/min (by C-G formula based on SCr of 0.79 mg/dL). Liver Function Tests: Recent Labs  Lab 07/21/20 2018 07/23/20 1805  AST 14* 56*  ALT 12 33  ALKPHOS 82 96  BILITOT 0.9 0.6  PROT 8.0 8.0  ALBUMIN 2.8* 2.6*   No results for input(s): LIPASE, AMYLASE in the last 168 hours. No results for input(s): AMMONIA in the last 168 hours. Coagulation Profile: Recent Labs  Lab 07/23/20 1605  INR 1.0   Cardiac Enzymes: No results for input(s): CKTOTAL, CKMB, CKMBINDEX, TROPONINI in the last  168 hours. BNP (last 3 results) No results for input(s): PROBNP in the last 8760 hours. HbA1C: No results for input(s): HGBA1C in the last 72 hours. CBG: No results for input(s): GLUCAP in the last 168 hours. Lipid Profile: No results for input(s): CHOL, HDL, LDLCALC, TRIG, CHOLHDL, LDLDIRECT in the last 72 hours. Thyroid Function Tests: No results for input(s): TSH, T4TOTAL, FREET4, T3FREE, THYROIDAB in the last 72 hours. Anemia Panel: No results for input(s): VITAMINB12, FOLATE, FERRITIN, TIBC, IRON, RETICCTPCT in the last 72 hours. Urine analysis: No results found for: COLORURINE, APPEARANCEUR, LABSPEC, PHURINE, GLUCOSEU, HGBUR, BILIRUBINUR, KETONESUR, PROTEINUR, UROBILINOGEN, NITRITE, LEUKOCYTESUR  Radiological Exams on Admission: CT ANGIO CHEST PE W OR WO CONTRAST  Result Date: 07/23/2020 CLINICAL DATA:  Metastatic lung cancer, assess treatment response, possible pneumonia EXAM: CT ANGIOGRAPHY CHEST WITH CONTRAST TECHNIQUE: Multidetector CT imaging of the chest was performed using the standard protocol during bolus administration of intravenous contrast. Multiplanar CT image reconstructions and MIPs were  obtained to evaluate the vascular anatomy. CONTRAST:  14mL OMNIPAQUE IOHEXOL 350 MG/ML SOLN, <See Chart> OMNIPAQUE IOHEXOL 300 MG/ML SOLN, 10mL OMNIPAQUE IOHEXOL 350 MG/ML SOLN COMPARISON:  06/25/2020 FINDINGS: Cardiovascular: Left chest port catheter. Satisfactory opacification of the left pulmonary arteries to the segmental level. No evidence of pulmonary embolism. 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 (Series 11, image 56). Normal heart size. No pericardial effusion. Aortic atherosclerosis. Extensive 3 vessel coronary artery calcifications and/or stents. Mediastinum/Nodes: No discretely enlarged mediastinal, hilar, or axillary lymph nodes. Interval increase in ill-defined left paratracheal soft tissue (series 11, image 41). Thyroid gland, trachea, and esophagus demonstrate no significant findings. Lungs/Pleura: Redemonstrated dense consolidation of the right upper lobe with a large cavitary lesion containing an air and fluid level, centered about the superior segment right lower lobe, increased in size compared to prior examination, measuring approximately 4.9 x 3.8 cm, previously 4.1 x 3.1 cm (series 6, image 51). There are multiple small right pulmonary nodules and cavitary lesions, which are similar to prior examination, an index nodule of the right lower lobe measuring 1.2 x 0.9 cm, not significantly changed (series 13, image 79). There is extensive new superimposed heterogeneous and ground-glass airspace opacity throughout the lungs. Severe diffuse bilateral bronchial wall thickening. Trace right pleural effusion. Upper Abdomen: No acute abnormality. Multiple hypodense liver lesions, incompletely imaged and incompletely characterized although not grossly changed compared to prior examination (series 11, image 104). Musculoskeletal: No chest wall abnormality. No acute or significant osseous findings. Review of the MIP images confirms the above  findings. IMPRESSION: 1. Negative examination for left-sided pulmonary embolism. The right pulmonary artery is again occluded in the distal vessels are non-opacified. 2. Redemonstrated dense consolidation of the right upper lobe with a large cavitary lesion containing an air and fluid level, increased in size compared to prior examination, measuring approximately 4.9 x 3.8 cm, previously 4.1 x 3.1 cm. Findings are consistent with worsened malignancy. 3. Multiple small right pulmonary nodules and cavitary lesions, which are similar to prior examination. 4. There is extensive new superimposed heterogeneous and ground-glass airspace opacity throughout the lungs. Severe diffuse bilateral bronchial wall thickening. Trace right pleural effusion. Findings are consistent with nonspecific infection or inflammation, including COVID pneumonia if clinically suspected. 5. Multiple hypodense liver lesions, incompletely imaged and incompletely characterized although not grossly changed compared to prior examination. 6. Coronary artery disease.  Aortic Atherosclerosis (ICD10-I70.0). Electronically Signed   By: Eddie Candle M.D.   On: 07/23/2020 15:19  I reviewed all nursing notes, pharmacy notes, vitals, pertinent old records.  Assessment/Plan  Principal Problem:   Pneumonia Active Problems:   Hypertension   Primary cancer of right upper lobe of lung (HCC)   Malnutrition of moderate degree   Acute on chronic respiratory failure with hypoxia (HCC)    # Acute on chronic hyper respiratory failure 2/2 COPD and pneumonia Patient got antibiotic treatment for 2 days but did not improve Continue supplemental oxygen and gradually wean off Continue breathing treatment Mucinex twice daily, continue incentive spirometry Patient was given cefepime and vancomycin in the ED which has been continued Pharmacy consulted for vancomycin dosing and trough monitoring Follow sputum culture and blood culture   # Hypokalemia,  potassium repleted. Monitor and replete as needed Check magnesium level  #Lung cancer, status post chemotherapy. Patient is following oncology as an outpatient, recommend to continue to follow As per patient's son patient received chemotherapy but there was no any difference on the cancer, so currently it is untreatable     Nutrition: Regular diet DVT Prophylaxis: Subcutaneous Lovenox  Advance goals of care discussion: DNR   Consults: None  Family Communication: family was present at bedside, at the time of interview.  Opportunity was given to ask question and all questions were answered satisfactorily.  Disposition: Admitted as inpatient, med-surge unit. Likely to be discharged SNF, in 2-3 days .  I have discussed plan of care as described above with RN and patient/family.  Severity of Illness: The appropriate patient status for this patient is INPATIENT. Inpatient status is judged to be reasonable and necessary in order to provide the required intensity of service to ensure the patient's safety. The patient's presenting symptoms, physical exam findings, and initial radiographic and laboratory data in the context of their chronic comorbidities is felt to place them at high risk for further clinical deterioration. Furthermore, it is not anticipated that the patient will be medically stable for discharge from the hospital within 2 midnights of admission. The following factors support the patient status of inpatient.   " The patient's presenting symptoms include respiratory failure. " The worrisome physical exam findings include shortness of breath. " The initial radiographic and laboratory data are worrisome because of pneumonia. " The chronic co-morbidities include lung cancer.   * I certify that at the point of admission it is my clinical judgment that the patient will require inpatient hospital care spanning beyond 2 midnights from the point of admission due to high intensity of  service, high risk for further deterioration and high frequency of surveillance required.*    Author: Val Riles, MD [facility] 07/23/2020 10:49 PM   To reach On-call, see care teams to locate the attending and reach out to them via [email or web address]. If 7PM-7AM, please contact night-coverage If you still have difficulty reaching the attending provider, please page the [facility] (Director on Call) for [facility] on amion for assistance.

## 2020-07-23 NOTE — Progress Notes (Signed)
Pt states she still feels bad. No appetite, not eating. Son unable to start antibiotic that he picked up  yest due to pt not eating anything. In a wheelchair. Dyspnea with exertion. Cough is finally becoming productive, dark yellow in color. Picked up imodium for the diarrhea. Pt eating applesauce and drinking ice water in exam room. No BMs over night. Pt lives alone. Uses walker at home. Slept better during the night. Stat labs drawn from peripheral vein. Port gave a blood return but not enough to get labs from it. Port needle flushed with NS. Port needle left in per NP request for [facility] for CT scan this afternoon. Son is taking pt for scan now. Pt to return in morning to Cancer center for IV antibiotics.

## 2020-07-23 NOTE — ED Triage Notes (Addendum)
Pt here for [facility]. Was here Monday and left before seen.  She is a cancer pt and her cancer doctor has been treating her with IV abx in the office this week for PNA as well given oral abx.  Pt has not been getting better despite this.  Per son has not eaten all week.  Mucous membrane dry.  Fever earlier in the week.  Tachypnea noted but unlabored at this time.  Intermittent AMS per son; he reports yesterday was asking for an aunt that passed away 20 years ago.  CTA done earlier today as well as cbc and bmp

## 2020-07-23 NOTE — Progress Notes (Signed)
CODE SEPSIS - PHARMACY COMMUNICATION  **Broad Spectrum Antibiotics should be administered within 1 hour of Sepsis diagnosis**  Time Code Sepsis Called/Page Received: 1946  Antibiotics Ordered: Cefepime + Vancomycin  Time of 1st antibiotic administration: 2000  Additional action taken by pharmacy: n/a  Benita Gutter 07/23/2020  7:50 PM

## 2020-07-23 NOTE — Consult Note (Signed)
PHARMACY -  BRIEF ANTIBIOTIC NOTE   Pharmacy has received consult(s) for cefepime and vancomycin from an ED provider.  The patient's profile has been reviewed for ht/wt/allergies/indication/available labs.    Patient with PCN allergy (rash). Appears to tolerate cephalosporins per chart review.  One time order(s) placed for: --Vancomycin 1 g (already ordered) --Cefepime 2 g (already ordered)  Further antibiotics/pharmacy consults should be ordered by admitting physician if indicated.                   Thank you, Benita Gutter 07/23/2020  7:48 PM

## 2020-07-23 NOTE — Progress Notes (Signed)
PHARMACY NOTE:  ANTIMICROBIAL RENAL DOSAGE ADJUSTMENT  Current antimicrobial regimen includes a mismatch between antimicrobial dosage and estimated renal function.  As per policy approved by the Pharmacy & Therapeutics and Medical Executive Committees, the antimicrobial dosage will be adjusted accordingly.  Current antimicrobial dosage: Cefepime 1 g q8h  Indication: Sepsis  Renal Function:  Estimated Creatinine Clearance: 46.8 mL/min (by C-G formula based on SCr of 0.79 mg/dL).  Antimicrobial dosage has been changed to:  Cefepime 2 g q12h  Thank you for allowing pharmacy to be a part of this patient's care.  Benita Gutter, [facility] 07/23/2020 8:49 PM

## 2020-07-23 NOTE — ED Provider Notes (Signed)
[facility] Emergency Department Provider Note  ____________________________________________  Time seen: Approximately 7:52 PM  I have reviewed the triage vital signs and the nursing notes.   HISTORY  Chief Complaint Altered Mental Status and Shortness of Breath    HPI Brenda Parker is a 84 y.o. female with a history of lung cancer, hypertension and recent outpatient treatment for pneumonia by her oncologist this week who sent to the ED for worsening shortness of breath for the past week.  Her oncologist has been giving her IV Rocephin and azithromycin but she is continued worsening.  Associated loss of appetite and very little oral intake and diarrhea over the past several days.  Son also reports that patient has been having some episodes of hallucination and confusion. Symptoms are constant, waxing and waning, worse with standing and walking, better at rest    Past Medical History:  Diagnosis Date  . Benign liver cyst   . Breast cancer ([place]) 2005   right breast  . COPD (chronic obstructive pulmonary disease) ([place])   . Dyspnea   . Glaucoma   . Hemoptysis 2019  . Hypertension    Not on medication for htn. patient denies this  . Lung mass 01/2018  . Personal history of radiation therapy      Patient Active Problem List   Diagnosis Date Noted  . Goals of care, counseling/discussion 09/03/2019  . Malnutrition of moderate degree 07/10/2018  . PNA (pneumonia) 07/07/2018  . Pneumonia 06/28/2018  . Primary cancer of right upper lobe of lung ([place]) 03/03/2018  . Hyperlipidemia, unspecified 02/08/2018  . Hypertension 02/08/2018  . Chronic cough 08/05/2017  . Lesion of liver 07/04/2017  . Abnormal CXR 07/01/2017  . Psoriasis of scalp 12/22/2015  . Back pain with left-sided sciatica 06/16/2015  . 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 Impaired fasting glucose 12/13/2012  . Chronic kidney disease 03/28/2012  . Seborrheic dermatitis 03/28/2012     Past  Surgical History:  Procedure Laterality Date  . BREAST EXCISIONAL BIOPSY Right 2005   positive, radiation  . BREAST LUMPECTOMY Right 03/2004  . CHOLECYSTECTOMY  1988  . COLONOSCOPY  2012  . ENDOBRONCHIAL ULTRASOUND N/A 02/21/2018   Procedure: ENDOBRONCHIAL ULTRASOUND;  Surgeon: Laverle Hobby, MD;  Location: [facility] ORS;  Service: Pulmonary;  Laterality: N/A;  . EYE SURGERY Bilateral 2009,2010   cataract extractions with lens implant  . GALLBLADDER SURGERY Bilateral   . IR FLUORO GUIDE PORT INSERTION RIGHT  03/10/2018  . JOINT REPLACEMENT Left 2008   left hip replacement  . PORTACATH PLACEMENT  03/10/2018  . TONSILLECTOMY  1960     Prior to Admission medications   Medication Sig Start Date End Date Taking? Authorizing Provider  albuterol (PROVENTIL HFA) 108 (90 Base) MCG/ACT inhaler Inhale 2 puffs into the lungs every 6 (six) hours as needed for wheezing or shortness of breath.  09/28/17 08/30/25 Yes [provider]  Cholecalciferol (VITAMIN D3) 1000 units CAPS Take 2,000 Units by mouth daily.    Yes [provider]  fluocinolone (SYNALAR) 0.01 % external solution Apply topically 2 (two) times daily.   Yes [provider]  gabapentin (NEURONTIN) 100 MG capsule Take 100 mg by mouth 3 (three) times daily.  06/17/20  Yes [provider]  latanoprost (XALATAN) 0.005 % ophthalmic solution Place 1 drop into both eyes at bedtime.    Yes [provider]  ondansetron (ZOFRAN) 8 MG tablet Take 8 mg by mouth every 8 (eight) hours as needed for nausea  or vomiting.   Yes [provider]  oxyCODONE (OXY IR/ROXICODONE) 5 MG immediate release tablet Take 1 tablet (5 mg total) by mouth 2 (two) times daily as needed for moderate pain or severe pain. 07/02/20  Yes [name], MD  TRELEGY ELLIPTA 100-62.5-25 MCG/INH AEPB Take 1 puff by mouth daily.  07/03/19  Yes [provider]  azithromycin (ZITHROMAX) 250 MG tablet Take 2 tablets on  day 1 (500mg ). Take 1 tablet (250 mg) days 2-5. 07/22/20   Jacquelin Hawking, NP  predniSONE (DELTASONE) 20 MG tablet Take 2 tablets (40 mg total) by mouth daily with breakfast. Patient taking differently: Take 40 mg by mouth 2 (two) times daily with a meal.  07/22/20   Jacquelin Hawking, NP     Allergies Penicillins   Family History  Problem Relation Age of Onset  . Breast cancer Mother 70  . CAD Father     Social History Social History   Tobacco Use  . Smoking status: Former Smoker    Packs/day: 1.00    Types: Cigarettes    Quit date: 10/25/2005    Years since quitting: 14.7  . Smokeless tobacco: Never Used  Vaping Use  . Vaping Use: Never used  Substance Use Topics  . Alcohol use: Not Currently    Comment: occasional glass of beer  . Drug use: Never    Review of Systems  Constitutional:   No fever or chills.  ENT:   No sore throat. No rhinorrhea. Cardiovascular:   No chest pain or syncope. Respiratory:   Positive shortness of breath and cough. Gastrointestinal:   Negative for abdominal pain or vomiting.  Positive diarrhea Musculoskeletal:   Negative for focal pain or swelling All other systems reviewed and are negative except as documented above in ROS and HPI.  ____________________________________________   PHYSICAL EXAM:  VITAL SIGNS: ED Triage Vitals  Enc Vitals Group     BP 07/23/20 1749 114/61     Pulse Rate 07/23/20 1749 (!) 103     Resp 07/23/20 1749 (!) 28     Temp 07/23/20 1749 99.1 F (37.3 C)     Temp Source 07/23/20 1749 Oral     SpO2 07/23/20 1749 (!) 89 %     Weight 07/23/20 1750 133 lb (60.3 kg)     Height 07/23/20 1750 5\' 4"  (1.626 m)     Head Circumference --      Peak Flow --      Pain Score 07/23/20 1750 0     Pain Loc --      Pain Edu? --      Excl. in [place]? --     Vital signs reviewed, nursing assessments reviewed.   Constitutional:   Alert and oriented.  Ill-appearing Eyes:   Conjunctivae are normal. EOMI. PERRL. ENT       Head:   Normocephalic and atraumatic.      Nose:   Normal      Mouth/Throat: Dry mucous membranes      Neck:   No meningismus. Full ROM. Hematological/Lymphatic/Immunilogical:   No cervical lymphadenopathy. Cardiovascular:   Tachycardia heart rate 105. Symmetric bilateral radial and DP pulses.  No murmurs. Cap refill less than 2 seconds. Respiratory:   Tachypnea with a respiratory rate of about 30.  Diffusely rhonchorous breath sounds. Gastrointestinal:   Soft and nontender. Non distended. There is no CVA tenderness.  No rebound, rigidity, or guarding.  Musculoskeletal:   Normal range of motion in all  extremities. No joint effusions.  No lower extremity tenderness.  No edema. Neurologic:   Normal speech and language.  Motor grossly intact. No acute focal neurologic deficits are appreciated.  Skin:    Skin is warm, dry and intact. No rash noted.  No petechiae, purpura, or bullae.  ____________________________________________    LABS (pertinent positives/negatives) (all labs ordered are listed, but only abnormal results are displayed) Labs Reviewed  HEPATIC FUNCTION PANEL - Abnormal; Notable for the following components:      Result Value   Albumin 2.6 (*)    AST 56 (*)    All other components within normal limits  CULTURE, BLOOD (ROUTINE X 2)  CULTURE, BLOOD (ROUTINE X 2)  RESPIRATORY PANEL BY RT PCR (FLU A&B, COVID)  LACTIC ACID, PLASMA  PROTIME-INR  LACTIC ACID, PLASMA  URINALYSIS, COMPLETE (UACMP) WITH MICROSCOPIC   ____________________________________________   EKG  Interpreted by me Normal sinus rhythm rate of 99, normal axis and intervals.  Normal QRS ST segments and T waves.  ____________________________________________    RADIOLOGY  CT ANGIO CHEST PE W OR WO CONTRAST  Result Date: 07/23/2020 CLINICAL DATA:  Metastatic lung cancer, assess treatment response, possible pneumonia EXAM: CT ANGIOGRAPHY CHEST WITH CONTRAST TECHNIQUE: Multidetector CT imaging of the  chest was performed using the standard protocol during bolus administration of intravenous contrast. Multiplanar CT image reconstructions and MIPs were obtained to evaluate the vascular anatomy. CONTRAST:  28mL OMNIPAQUE IOHEXOL 350 MG/ML SOLN, <See Chart> OMNIPAQUE IOHEXOL 300 MG/ML SOLN, 5mL OMNIPAQUE IOHEXOL 350 MG/ML SOLN COMPARISON:  06/25/2020 FINDINGS: Cardiovascular: Left chest port catheter. Satisfactory opacification of the left pulmonary arteries to the segmental level. No evidence of pulmonary embolism. 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 (Series 11, image 56). Normal heart size. No pericardial effusion. Aortic atherosclerosis. Extensive 3 vessel coronary artery calcifications and/or stents. Mediastinum/Nodes: No discretely enlarged mediastinal, hilar, or axillary lymph nodes. Interval increase in ill-defined left paratracheal soft tissue (series 11, image 41). Thyroid gland, trachea, and esophagus demonstrate no significant findings. Lungs/Pleura: Redemonstrated dense consolidation of the right upper lobe with a large cavitary lesion containing an air and fluid level, centered about the superior segment right lower lobe, increased in size compared to prior examination, measuring approximately 4.9 x 3.8 cm, previously 4.1 x 3.1 cm (series 6, image 51). There are multiple small right pulmonary nodules and cavitary lesions, which are similar to prior examination, an index nodule of the right lower lobe measuring 1.2 x 0.9 cm, not significantly changed (series 13, image 79). There is extensive new superimposed heterogeneous and ground-glass airspace opacity throughout the lungs. Severe diffuse bilateral bronchial wall thickening. Trace right pleural effusion. Upper Abdomen: No acute abnormality. Multiple hypodense liver lesions, incompletely imaged and incompletely characterized although not grossly changed compared to prior examination (series  11, image 104). Musculoskeletal: No chest wall abnormality. No acute or significant osseous findings. Review of the MIP images confirms the above findings. IMPRESSION: 1. Negative examination for left-sided pulmonary embolism. The right pulmonary artery is again occluded in the distal vessels are non-opacified. 2. Redemonstrated dense consolidation of the right upper lobe with a large cavitary lesion containing an air and fluid level, increased in size compared to prior examination, measuring approximately 4.9 x 3.8 cm, previously 4.1 x 3.1 cm. Findings are consistent with worsened malignancy. 3. Multiple small right pulmonary nodules and cavitary lesions, which are similar to prior examination. 4. There is extensive new superimposed heterogeneous and ground-glass  airspace opacity throughout the lungs. Severe diffuse bilateral bronchial wall thickening. Trace right pleural effusion. Findings are consistent with nonspecific infection or inflammation, including COVID pneumonia if clinically suspected. 5. Multiple hypodense liver lesions, incompletely imaged and incompletely characterized although not grossly changed compared to prior examination. 6. Coronary artery disease.  Aortic Atherosclerosis (ICD10-I70.0). Electronically Signed   By: Eddie Candle M.D.   On: 07/23/2020 15:19    ____________________________________________   PROCEDURES .Critical Care Performed by: Carrie Mew, MD Authorized by: Carrie Mew, MD   Critical care provider statement:    Critical care time (minutes):  35   Critical care time was exclusive of:  Separately billable procedures and treating other patients   Critical care was necessary to treat or prevent imminent or life-threatening deterioration of the following conditions:  Sepsis, respiratory failure and dehydration   Critical care was time spent personally by me on the following activities:  Development of treatment plan with patient or surrogate, discussions  with consultants, evaluation of patient's response to treatment, examination of patient, obtaining history from patient or surrogate, ordering and performing treatments and interventions, ordering and review of laboratory studies, ordering and review of radiographic studies, pulse oximetry, re-evaluation of patient's condition and review of old charts    ____________________________________________  DIFFERENTIAL DIAGNOSIS   Multifocal pneumonia, Covid  CLINICAL IMPRESSION / ASSESSMENT AND PLAN / ED COURSE  Medications ordered in the ED: Medications  lactated ringers infusion (has no administration in time range)  lactated ringers bolus 1,000 mL (1,000 mLs Intravenous New Bag/Given 07/23/20 1959)  vancomycin (VANCOCIN) IVPB 1000 mg/200 mL premix (has no administration in time range)  ceFEPIme (MAXIPIME) 2 g in sodium chloride 0.9 % 100 mL IVPB (2 g Intravenous New Bag/Given 07/23/20 2000)    Pertinent labs & imaging results that were available during my care of the patient were reviewed by me and considered in my medical decision making (see chart for details).  Brenda Parker was evaluated in Emergency Department on 07/23/2020 for the symptoms described in the history of present illness. She was evaluated in the context of the global COVID-19 pandemic, which necessitated consideration that the patient might be at risk for infection with the SARS-CoV-2 virus that causes COVID-19. Institutional protocols and algorithms that pertain to the evaluation of patients at risk for COVID-19 are in a state of rapid change based on information released by regulatory bodies including the CDC and federal and state organizations. These policies and algorithms were followed during the patient's care in the ED.   Patient presents with known multifocal pneumonia with outpatient treatment failure and hypoxic respiratory failure.  Also has tachypnea tachycardia indicative of sepsis.  CT angiogram was done on an  outpatient basis earlier today negative for PE, but did reveal multifocal groundglass opacities.  She had a negative Covid test 2 days ago but with imaging and symptoms suggestive of Covid, we will repeat the PCR test today to help guide therapy.  She will need to be admitted for further management.      ____________________________________________   FINAL CLINICAL IMPRESSION(S) / ED DIAGNOSES    Final diagnoses:  Sepsis with acute hypoxic respiratory failure without septic shock, due to unspecified organism [facility])  Multifocal pneumonia  Primary malignant neoplasm of lung metastatic to other site, unspecified laterality [facility]  Dehydration     ED Discharge Orders    None      Portions of this note were generated with dragon dictation software. Dictation errors may occur despite  best attempts at proofreading.   Carrie Mew, MD 07/23/20 2026

## 2020-07-23 NOTE — Consult Note (Signed)
Pharmacy Antibiotic Note  Brenda Parker is a 84 y.o. female with medical history including lung cancer admitted on 9/29/2021with pneumonia. Patient has been receiving outpatient treatment for pneumonia with ceftriaxone and azithromycin but now with worsening clinical status. Pharmacy has been consulted for vancomycin dosing. Patient is also ordered cefepime.  Plan:  Vancomycin 1500 mg (25 mg/kg) LD x 1 followed by maintenance regimen of vancomycin 500 mg q12h per nomogram  Daily Scr per protocol. Continue to monitor culture results.  Height: 5\' 4"  (162.6 cm) Weight: 60.3 kg (133 lb) IBW/kg (Calculated) : 54.7  Temp (24hrs), Avg:98.7 F (37.1 C), Min:98.3 F (36.8 C), Max:99.1 F (37.3 C)  Recent Labs  Lab 07/21/20 2018 07/23/20 1134 07/23/20 1805 07/23/20 2002  WBC 12.0* 13.3*  --   --   CREATININE 1.07* 0.79  --   --   LATICACIDVEN  --   --  1.6 1.3    Estimated Creatinine Clearance: 46.8 mL/min (by C-G formula based on SCr of 0.79 mg/dL).    Allergies  Allergen Reactions  . Penicillins Rash    Patient had a rash after an injection in 1973.  Unsure if taken orally will cause any reaction  Has patient had a PCN reaction causing immediate rash, facial/tongue/throat swelling, SOB or lightheadedness with hypotension: Yes Has patient had a PCN reaction causing severe rash involving mucus membranes or skin necrosis: No Has patient had a PCN reaction that required hospitalization: No Has patient had a PCN reaction occurring within the last 10 years: No     Antimicrobials this admission: Cefepime 9/29 >>  Vancomycin 9/29 >>   Dose adjustments this admission: n/a  Microbiology results: 9/29 BCx: pending 9/29 Sputum: pending  9/29 MRSA PCR: pending  Thank you for allowing pharmacy to be a part of this patient's care.  Benita Gutter 07/23/2020 9:52 PM

## 2020-07-23 NOTE — ED Notes (Signed)
First set blood cx by this RN, second by dorian NT

## 2020-07-24 ENCOUNTER — Inpatient Hospital Stay: Payer: Medicare Other

## 2020-07-24 DIAGNOSIS — C3411 Malignant neoplasm of upper lobe, right bronchus or lung: Secondary | ICD-10-CM

## 2020-07-24 DIAGNOSIS — J9621 Acute and chronic respiratory failure with hypoxia: Secondary | ICD-10-CM

## 2020-07-24 LAB — PROCALCITONIN: Procalcitonin: 1.93 ng/mL

## 2020-07-24 LAB — CBC
HCT: 26.1 % — ABNORMAL LOW (ref 36.0–46.0)
Hemoglobin: 8.5 g/dL — ABNORMAL LOW (ref 12.0–15.0)
MCH: 28.1 pg (ref 26.0–34.0)
MCHC: 32.6 g/dL (ref 30.0–36.0)
MCV: 86.4 fL (ref 80.0–100.0)
Platelets: 311 10*3/uL (ref 150–400)
RBC: 3.02 MIL/uL — ABNORMAL LOW (ref 3.87–5.11)
RDW: 16.7 % — ABNORMAL HIGH (ref 11.5–15.5)
WBC: 9.3 10*3/uL (ref 4.0–10.5)
nRBC: 0 % (ref 0.0–0.2)

## 2020-07-24 LAB — BASIC METABOLIC PANEL
Anion gap: 7 (ref 5–15)
BUN: 30 mg/dL — ABNORMAL HIGH (ref 8–23)
CO2: 24 mmol/L (ref 22–32)
Calcium: 8.9 mg/dL (ref 8.9–10.3)
Chloride: 108 mmol/L (ref 98–111)
Creatinine, Ser: 0.91 mg/dL (ref 0.44–1.00)
GFR calc Af Amer: >60 mL/min (ref >60)
GFR calc non Af Amer: 59 mL/min — ABNORMAL LOW (ref >60)
Glucose, Bld: 139 mg/dL — ABNORMAL HIGH (ref 70–99)
Potassium: 4.1 mmol/L (ref 3.5–5.1)
Sodium: 139 mmol/L (ref 135–145)

## 2020-07-24 LAB — URINALYSIS, COMPLETE (UACMP) WITH MICROSCOPIC
Bacteria, UA: NONE SEEN
Bilirubin Urine: NEGATIVE
Glucose, UA: NEGATIVE mg/dL
Hgb urine dipstick: NEGATIVE
Ketones, ur: NEGATIVE mg/dL
Leukocytes,Ua: NEGATIVE
Nitrite: NEGATIVE
Protein, ur: 30 mg/dL — AB
Specific Gravity, Urine: 1.035 — ABNORMAL HIGH (ref 1.005–1.030)
pH: 5 (ref 5.0–8.0)

## 2020-07-24 LAB — MAGNESIUM: Magnesium: 1.9 mg/dL (ref 1.7–2.4)

## 2020-07-24 LAB — PHOSPHORUS: Phosphorus: 3.1 mg/dL (ref 2.5–4.6)

## 2020-07-24 LAB — MRSA PCR SCREENING: MRSA by PCR: NEGATIVE

## 2020-07-24 MED ORDER — ENSURE MAX PROTEIN PO LIQD
11.0000 [oz_av] | Freq: Two times a day (BID) | ORAL | Status: DC
  Administered 2020-07-25: 11 [oz_av] via ORAL
  Filled 2020-07-24: qty 330

## 2020-07-24 MED ORDER — FLUTICASONE FUROATE-VILANTEROL 100-25 MCG/INH IN AEPB
1.0000 | INHALATION_SPRAY | Freq: Every day | RESPIRATORY_TRACT | Status: DC
  Administered 2020-07-24 – 2020-07-30 (×6): 1 via RESPIRATORY_TRACT
  Filled 2020-07-24: qty 28

## 2020-07-24 MED ORDER — VANCOMYCIN HCL 750 MG/150ML IV SOLN
750.0000 mg | INTRAVENOUS | Status: DC
  Filled 2020-07-24: qty 150

## 2020-07-24 MED ORDER — OXYCODONE HCL 5 MG PO TABS
5.0000 mg | ORAL_TABLET | Freq: Three times a day (TID) | ORAL | Status: DC | PRN
  Administered 2020-07-25 – 2020-07-30 (×10): 5 mg via ORAL
  Filled 2020-07-24 (×10): qty 1

## 2020-07-24 MED ORDER — UMECLIDINIUM BROMIDE 62.5 MCG/INH IN AEPB
1.0000 | INHALATION_SPRAY | Freq: Every day | RESPIRATORY_TRACT | Status: DC
  Administered 2020-07-24 – 2020-07-30 (×6): 1 via RESPIRATORY_TRACT
  Filled 2020-07-24: qty 7

## 2020-07-24 NOTE — ED Notes (Addendum)
This RN assisting pt with her meal tray. Repositioned. Pt prompted to TCDB. Has a wet cough.

## 2020-07-24 NOTE — ED Notes (Signed)
Pt resting at this time, call bell within reach, stretcher locked in lowest position

## 2020-07-24 NOTE — Progress Notes (Signed)
PROGRESS NOTE    Brenda Parker  JME:[id number] DOB: 02-19-1936 DOA: 07/23/2020 PCP: Leonel Ramsay, MD    Assessment & Plan:   Principal Problem:   Pneumonia Active Problems:   Hypertension   Primary cancer of right upper lobe of lung ([place])   Malnutrition of moderate degree   Acute on chronic respiratory failure with hypoxia ([place])    Brenda Parker is a 84 y.o. female with Past medical history of lung cancer status post chemotherapy, COPD, HTN, HLD, CKD presented at [facility] ED due to worsening of shortness of breath. Patient was treated as an outpatient with antibiotics for 2 days but her shortness of breath got worse and she was hypoxic. Patient has oxygen at home but she was not using it. Patient denied any chest pain or palpitations, denied any abdominal pain, no nausea vomiting or diarrhea.  # Acute on chronic hyper respiratory failure 2/2 COPD and pneumonia Patient got antibiotic treatment for 2 days but did not improve --hypoxic on presentation, placed on 4L O2 and started on vanc/cefe PLAN: --Continue supplemental oxygen and gradually wean off --continue cefe --d/c vanc since MRSA screen neg --Continue breathing treatment Mucinex twice daily, continue incentive spirometry  # Hypokalemia --Monitor ane replete PRN  # Lung cancer, status post chemotherapy. --chemo on hold due to intolerance --CT chest showed worsening malignancy PLAN: --Oncology consult  # weight loss, decreased appetite, likely malnutrition --due to cancer, chemo --Ensure max protein BID   DVT prophylaxis: Lovenox SQ Code Status: DNR  Family Communication:  Status is: inpatient Dispo:   The patient is from: home Anticipated d/c is to: home Anticipated d/c date is: >3 days Patient currently is not medically stable to d/c due to: significant hypoxia, worsening lung cancer, PNA, currently on 4L O2 and IV abx   Subjective and Interval History:  Pt reported having sputum stuck in her lungs.   No chest pain.  Chronic back pain.  No appetite.   Objective: Vitals:   07/24/20 2030 07/24/20 2057 07/25/20 0102 07/25/20 0526  BP: 137/74 139/69 (!) 141/79 (!) 182/88  Pulse: 94 92 77 86  Resp:  17 18 17   Temp:  97.7 F (36.5 C) (!) 97.5 F (36.4 C) 97.7 F (36.5 C)  TempSrc:  Oral Oral Oral  SpO2:  97% 100% 98%  Weight:      Height:        Intake/Output Summary (Last 24 hours) at 07/25/2020 0532 Last data filed at 07/24/2020 2016 Gross per 24 hour  Intake 100 ml  Output 1050 ml  Net -950 ml   Filed Weights   07/23/20 1750  Weight: 60.3 kg    Examination:   Constitutional: NAD, AAOx3 HEENT: conjunctivae and lids normal, EOMI CV: RRR no M,R,G. Distal pulses +2.  No cyanosis.   RESP: diffuse rhonchi, gurgling breath sounds, on 4L GI: +BS, NTND Extremities: No effusions, edema, or tenderness in BLE SKIN: warm, dry and intact Neuro: II - XII grossly intact.  Sensation intact Psych: Normal mood and affect.  Appropriate judgement and reason   Data Reviewed: I have personally reviewed following labs and imaging studies  CBC: Recent Labs  Lab 07/21/20 2018 07/23/20 1134 07/24/20 0357 07/25/20 0423  WBC 12.0* 13.3* 9.3 10.8*  NEUTROABS  --  12.2*  --   --   HGB 9.7* 9.1* 8.5* 9.5*  HCT 30.0* 27.9* 26.1* 31.2*  MCV 87.7 86.1 86.4 90.7  PLT 376 446* 311 229   Basic Metabolic Panel:  Recent Labs  Lab 07/21/20 2018 07/23/20 1134 07/24/20 0357 07/25/20 0423  NA 134* 138 139 140  K 3.3* 3.2* 4.1 3.8  CL 103 106 108 106  CO2 18* 20* 24 24  GLUCOSE 133* 143* 139* 110*  BUN 27* 31* 30* 29*  CREATININE 1.07* 0.79 0.91 0.85  CALCIUM 8.9 8.8* 8.9 9.0  MG  --   --  1.9 2.1  PHOS  --   --  3.1  --    GFR: Estimated Creatinine Clearance: 44.1 mL/min (by C-G formula based on SCr of 0.85 mg/dL). Liver Function Tests: Recent Labs  Lab 07/21/20 2018 07/23/20 1805  AST 14* 56*  ALT 12 33  ALKPHOS 82 96  BILITOT 0.9 0.6  PROT 8.0 8.0  ALBUMIN 2.8* 2.6*    No results for input(s): LIPASE, AMYLASE in the last 168 hours. No results for input(s): AMMONIA in the last 168 hours. Coagulation Profile: Recent Labs  Lab 07/23/20 1605  INR 1.0   Cardiac Enzymes: No results for input(s): CKTOTAL, CKMB, CKMBINDEX, TROPONINI in the last 168 hours. BNP (last 3 results) No results for input(s): PROBNP in the last 8760 hours. HbA1C: No results for input(s): HGBA1C in the last 72 hours. CBG: No results for input(s): GLUCAP in the last 168 hours. Lipid Profile: No results for input(s): CHOL, HDL, LDLCALC, TRIG, CHOLHDL, LDLDIRECT in the last 72 hours. Thyroid Function Tests: No results for input(s): TSH, T4TOTAL, FREET4, T3FREE, THYROIDAB in the last 72 hours. Anemia Panel: No results for input(s): VITAMINB12, FOLATE, FERRITIN, TIBC, IRON, RETICCTPCT in the last 72 hours. Sepsis Labs: Recent Labs  Lab 07/23/20 1805 07/23/20 2002 07/24/20 0357  PROCALCITON  --   --  1.93  LATICACIDVEN 1.6 1.3  --     Recent Results (from the past 240 hour(s))  Respiratory Panel by RT PCR (Flu A&B, Covid) - Nasopharyngeal Swab     Status: None   Collection Time: 07/21/20  8:25 PM   Specimen: Nasopharyngeal Swab  Result Value Ref Range Status   SARS Coronavirus 2 by RT PCR NEGATIVE NEGATIVE Final    Comment: (NOTE) SARS-CoV-2 target nucleic acids are NOT DETECTED.  The SARS-CoV-2 RNA is generally detectable in upper respiratoy specimens during the acute phase of infection. The lowest concentration of SARS-CoV-2 viral copies this assay can detect is 131 copies/mL. A negative result does not preclude SARS-Cov-2 infection and should not be used as the sole basis for treatment or other patient management decisions. A negative result may occur with  improper specimen collection/handling, submission of specimen other than nasopharyngeal swab, presence of viral mutation(s) within the areas targeted by this assay, and inadequate number of viral copies (<131  copies/mL). A negative result must be combined with clinical observations, patient history, and epidemiological information. The expected result is Negative.  Fact Sheet for Patients:  PinkCheek.be  Fact Sheet for Healthcare Providers:  GravelBags.it  This test is no t yet approved or cleared by the [place] FDA and  has been authorized for detection and/or diagnosis of SARS-CoV-2 by FDA under an Emergency Use Authorization (EUA). This EUA will remain  in effect (meaning this test can be used) for the duration of the COVID-19 declaration under Section 564(b)(1) of the Act, 21 U.S.C. section 360bbb-3(b)(1), unless the authorization is terminated or revoked sooner.     Influenza A by PCR NEGATIVE NEGATIVE Final   Influenza B by PCR NEGATIVE NEGATIVE Final    Comment: (NOTE) The Xpert Xpress SARS-CoV-2/FLU/RSV assay is  intended as an aid in  the diagnosis of influenza from Nasopharyngeal swab specimens and  should not be used as a sole basis for treatment. Nasal washings and  aspirates are unacceptable for Xpert Xpress SARS-CoV-2/FLU/RSV  testing.  Fact Sheet for Patients: PinkCheek.be  Fact Sheet for Healthcare Providers: GravelBags.it  This test is not yet approved or cleared by the [place] FDA and  has been authorized for detection and/or diagnosis of SARS-CoV-2 by  FDA under an Emergency Use Authorization (EUA). This EUA will remain  in effect (meaning this test can be used) for the duration of the  Covid-19 declaration under Section 564(b)(1) of the Act, 21  U.S.C. section 360bbb-3(b)(1), unless the authorization is  terminated or revoked. Performed at [facility], [address] [place], [place] [address]   Gastrointestinal Panel by PCR , Stool     Status: None   Collection Time: 07/22/20 12:20 PM   Specimen: Stool  Result Value Ref  Range Status   Campylobacter species NOT DETECTED NOT DETECTED Final   Plesimonas shigelloides NOT DETECTED NOT DETECTED Final   Salmonella species NOT DETECTED NOT DETECTED Final   Yersinia enterocolitica NOT DETECTED NOT DETECTED Final   Vibrio species NOT DETECTED NOT DETECTED Final   Vibrio cholerae NOT DETECTED NOT DETECTED Final   Enteroaggregative E coli (EAEC) NOT DETECTED NOT DETECTED Final   Enteropathogenic E coli (EPEC) NOT DETECTED NOT DETECTED Final   Enterotoxigenic E coli (ETEC) NOT DETECTED NOT DETECTED Final   Shiga like toxin producing E coli (STEC) NOT DETECTED NOT DETECTED Final   Shigella/Enteroinvasive E coli (EIEC) NOT DETECTED NOT DETECTED Final   Cryptosporidium NOT DETECTED NOT DETECTED Final   Cyclospora cayetanensis NOT DETECTED NOT DETECTED Final   Entamoeba histolytica NOT DETECTED NOT DETECTED Final   Giardia lamblia NOT DETECTED NOT DETECTED Final   Adenovirus F40/41 NOT DETECTED NOT DETECTED Final   Astrovirus NOT DETECTED NOT DETECTED Final   Norovirus GI/GII NOT DETECTED NOT DETECTED Final   Rotavirus A NOT DETECTED NOT DETECTED Final   Sapovirus (I, II, IV, and V) NOT DETECTED NOT DETECTED Final    Comment: Performed at [facility], [address] [place], [place] [address]  C difficile quick screen w PCR reflex     Status: None   Collection Time: 07/22/20 12:20 PM   Specimen: STOOL  Result Value Ref Range Status   C Diff antigen NEGATIVE NEGATIVE Final   C Diff toxin NEGATIVE NEGATIVE Final   C Diff interpretation No C. difficile detected.  Final    Comment: Performed at [facility], [address] [place], [place] [address]  Culture, blood (Routine x 2)     Status: None (Preliminary result)   Collection Time: 07/23/20  6:05 PM   Specimen: BLOOD  Result Value Ref Range Status   Specimen Description BLOOD BLOOD LEFT ARM  Final   Special Requests   Final    BOTTLES DRAWN AEROBIC AND ANAEROBIC Blood Culture adequate volume    Culture   Final    NO GROWTH < 12 HOURS Performed at [facility], [address] [place],  [address]    Report Status PENDING  Incomplete  Culture, blood (Routine x 2)     Status: None (Preliminary result)   Collection Time: 07/23/20  6:06 PM   Specimen: BLOOD  Result Value Ref Range Status   Specimen Description BLOOD BLOOD RIGHT ARM  Final   Special Requests   Final  BOTTLES DRAWN AEROBIC AND ANAEROBIC Blood Culture adequate volume   Culture   Final    NO GROWTH < 12 HOURS Performed at [facility], [address] [place], [place] [address]    Report Status PENDING  Incomplete  Respiratory Panel by RT PCR (Flu A&B, Covid) - Nasopharyngeal Swab     Status: None   Collection Time: 07/23/20  8:02 PM   Specimen: Nasopharyngeal Swab  Result Value Ref Range Status   SARS Coronavirus 2 by RT PCR NEGATIVE NEGATIVE Final    Comment: (NOTE) SARS-CoV-2 target nucleic acids are NOT DETECTED.  The SARS-CoV-2 RNA is generally detectable in upper respiratoy specimens during the acute phase of infection. The lowest concentration of SARS-CoV-2 viral copies this assay can detect is 131 copies/mL. A negative result does not preclude SARS-Cov-2 infection and should not be used as the sole basis for treatment or other patient management decisions. A negative result may occur with  improper specimen collection/handling, submission of specimen other than nasopharyngeal swab, presence of viral mutation(s) within the areas targeted by this assay, and inadequate number of viral copies (<131 copies/mL). A negative result must be combined with clinical observations, patient history, and epidemiological information. The expected result is Negative.  Fact Sheet for Patients:  PinkCheek.be  Fact Sheet for Healthcare Providers:  GravelBags.it  This test is no t yet approved or cleared by the [place] FDA  and  has been authorized for detection and/or diagnosis of SARS-CoV-2 by FDA under an Emergency Use Authorization (EUA). This EUA will remain  in effect (meaning this test can be used) for the duration of the COVID-19 declaration under Section 564(b)(1) of the Act, 21 U.S.C. section 360bbb-3(b)(1), unless the authorization is terminated or revoked sooner.     Influenza A by PCR NEGATIVE NEGATIVE Final   Influenza B by PCR NEGATIVE NEGATIVE Final    Comment: (NOTE) The Xpert Xpress SARS-CoV-2/FLU/RSV assay is intended as an aid in  the diagnosis of influenza from Nasopharyngeal swab specimens and  should not be used as a sole basis for treatment. Nasal washings and  aspirates are unacceptable for Xpert Xpress SARS-CoV-2/FLU/RSV  testing.  Fact Sheet for Patients: PinkCheek.be  Fact Sheet for Healthcare Providers: GravelBags.it  This test is not yet approved or cleared by the [place] FDA and  has been authorized for detection and/or diagnosis of SARS-CoV-2 by  FDA under an Emergency Use Authorization (EUA). This EUA will remain  in effect (meaning this test can be used) for the duration of the  Covid-19 declaration under Section 564(b)(1) of the Act, 21  U.S.C. section 360bbb-3(b)(1), unless the authorization is  terminated or revoked. Performed at [facility], [address] [place], [place] [address]   MRSA PCR Screening     Status: None   Collection Time: 07/24/20  4:05 PM   Specimen: Nasal Mucosa; Nasopharyngeal  Result Value Ref Range Status   MRSA by PCR NEGATIVE NEGATIVE Final    Comment:        The GeneXpert MRSA Assay (FDA approved for NASAL specimens only), is one component of a comprehensive MRSA colonization surveillance program. It is not intended to diagnose MRSA infection nor to guide or monitor treatment for MRSA infections. Performed at [facility], [address] [place], [place] [address]       Radiology Studies: CT ANGIO CHEST PE W OR WO CONTRAST  Result Date: 07/23/2020 CLINICAL DATA:  Metastatic lung cancer, assess treatment response, possible  pneumonia EXAM: CT ANGIOGRAPHY CHEST WITH CONTRAST TECHNIQUE: Multidetector CT imaging of the chest was performed using the standard protocol during bolus administration of intravenous contrast. Multiplanar CT image reconstructions and MIPs were obtained to evaluate the vascular anatomy. CONTRAST:  95mL OMNIPAQUE IOHEXOL 350 MG/ML SOLN, <See Chart> OMNIPAQUE IOHEXOL 300 MG/ML SOLN, 53mL OMNIPAQUE IOHEXOL 350 MG/ML SOLN COMPARISON:  06/25/2020 FINDINGS: Cardiovascular: Left chest port catheter. Satisfactory opacification of the left pulmonary arteries to the segmental level. No evidence of pulmonary embolism. 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 (Series 11, image 56). Normal heart size. No pericardial effusion. Aortic atherosclerosis. Extensive 3 vessel coronary artery calcifications and/or stents. Mediastinum/Nodes: No discretely enlarged mediastinal, hilar, or axillary lymph nodes. Interval increase in ill-defined left paratracheal soft tissue (series 11, image 41). Thyroid gland, trachea, and esophagus demonstrate no significant findings. Lungs/Pleura: Redemonstrated dense consolidation of the right upper lobe with a large cavitary lesion containing an air and fluid level, centered about the superior segment right lower lobe, increased in size compared to prior examination, measuring approximately 4.9 x 3.8 cm, previously 4.1 x 3.1 cm (series 6, image 51). There are multiple small right pulmonary nodules and cavitary lesions, which are similar to prior examination, an index nodule of the right lower lobe measuring 1.2 x 0.9 cm, not significantly changed (series 13, image 79). There is extensive new superimposed heterogeneous and ground-glass airspace opacity  throughout the lungs. Severe diffuse bilateral bronchial wall thickening. Trace right pleural effusion. Upper Abdomen: No acute abnormality. Multiple hypodense liver lesions, incompletely imaged and incompletely characterized although not grossly changed compared to prior examination (series 11, image 104). Musculoskeletal: No chest wall abnormality. No acute or significant osseous findings. Review of the MIP images confirms the above findings. IMPRESSION: 1. Negative examination for left-sided pulmonary embolism. The right pulmonary artery is again occluded in the distal vessels are non-opacified. 2. Redemonstrated dense consolidation of the right upper lobe with a large cavitary lesion containing an air and fluid level, increased in size compared to prior examination, measuring approximately 4.9 x 3.8 cm, previously 4.1 x 3.1 cm. Findings are consistent with worsened malignancy. 3. Multiple small right pulmonary nodules and cavitary lesions, which are similar to prior examination. 4. There is extensive new superimposed heterogeneous and ground-glass airspace opacity throughout the lungs. Severe diffuse bilateral bronchial wall thickening. Trace right pleural effusion. Findings are consistent with nonspecific infection or inflammation, including COVID pneumonia if clinically suspected. 5. Multiple hypodense liver lesions, incompletely imaged and incompletely characterized although not grossly changed compared to prior examination. 6. Coronary artery disease.  Aortic Atherosclerosis (ICD10-I70.0). Electronically Signed   By: Eddie Candle M.D.   On: 07/23/2020 15:19     Scheduled Meds: . cholecalciferol  2,000 Units Oral Daily  . enoxaparin (LOVENOX) injection  40 mg Subcutaneous Q24H  . umeclidinium bromide  1 puff Inhalation Daily   And  . fluticasone furoate-vilanterol  1 puff Inhalation Daily  . gabapentin  100 mg Oral TID  . guaiFENesin  600 mg Oral BID  . [START ON 07/26/2020] influenza vaccine  adjuvanted  0.5 mL Intramuscular Tomorrow-1000  . ipratropium  0.5 mg Nebulization Q6H  . latanoprost  1 drop Both Eyes QHS  . [START ON 07/26/2020] pneumococcal 23 valent vaccine  0.5 mL Intramuscular Tomorrow-1000  . Ensure Max Protein  11 oz Oral BID  . sodium chloride flush  3 mL Intravenous Q12H   Continuous Infusions: . sodium chloride Stopped (07/24/20 2100)  .  ceFEPime (MAXIPIME) IV Stopped (07/24/20 2035)     LOS: 2 days     Enzo Bi, MD [facility] If 7PM-7AM, please contact night-coverage 07/25/2020, 5:32 AM

## 2020-07-24 NOTE — ED Notes (Signed)
Pt's L and R IV's assessed about 30 minutes before heading up to floor room. No issues with flushing/discomfort/infiltration noted at that time. Old dried blood noted at R IV site. However, once pt settled in floor room this RN noted the L IV site at the [facility] looked infiltrated. Pt denied pain at site. Floor RN made aware.

## 2020-07-24 NOTE — ED Notes (Signed)
Pt repositioned

## 2020-07-24 NOTE — ED Notes (Signed)
Pt alert to self only.

## 2020-07-24 NOTE — Assessment & Plan Note (Addendum)
#  84 year old female patient with metastatic/stage IV lung cancer currently on ""chemo holiday" is currently admitted hospital for worsening shortness of breath/cough; CT scan suggestive of pneumonia.  # Acute on chronic respiratory failure secondary to bilateral pneumonia-currently on cefepime.  Clinical improvement noted.  Recommend total of 14 days of antibiotics.  # Metastatic lung cancer/stage IV-currently on chemo holiday.  September, 29th 2021-shows mildly progressive malignancy; however patient's symptoms are likely secondary to pneumonia.    #Poor appetite-secondary to underlying malignancy/pneumonia.-Continue prednisone for now.  #DNR/DNI.  #Disposition: Multiple discussion with patient/son regarding disposition hospice home versus SNF with physical therapy.  Since patient is clinically improved I would agree with recommendation for SNF physical therapy.  Palliative care to follow closely at SNF.  Discussed with the patient's son; and is in agreement.  Son updated that we will cancel appointment in the clinic for now.  Discussed with [employer].

## 2020-07-24 NOTE — ED Notes (Signed)
Please notify Avereigh Spainhower, son and [place] [phone] when pt is moved.

## 2020-07-24 NOTE — Consult Note (Signed)
Pharmacy Antibiotic Note  Brenda Parker is a 84 y.o. female with medical history including lung cancer admitted on 9/29/2021with pneumonia. Patient has been receiving outpatient treatment for pneumonia with ceftriaxone and azithromycin but now with worsening clinical status. Pharmacy has been consulted for vancomycin dosing. Patient is also ordered cefepime.  MRSA PCR was ordered yesterday and has not been obtained.   Plan:  Adjust Vancomycin to 750 mg Q24H, expected Css min 11.8 and AUC 448.  Daily Scr per protocol. Continue to monitor culture results.  Height: 5\' 4"  (162.6 cm) Weight: 60.3 kg (133 lb) IBW/kg (Calculated) : 54.7  Temp (24hrs), Avg:99.1 F (37.3 C), Min:99.1 F (37.3 C), Max:99.1 F (37.3 C)  Recent Labs  Lab 07/21/20 2018 07/23/20 1134 07/23/20 1805 07/23/20 2002 07/24/20 0357  WBC 12.0* 13.3*  --   --  9.3  CREATININE 1.07* 0.79  --   --  0.91  LATICACIDVEN  --   --  1.6 1.3  --     Estimated Creatinine Clearance: 41.2 mL/min (by C-G formula based on SCr of 0.91 mg/dL).    Allergies  Allergen Reactions  . Penicillins Rash    Patient had a rash after an injection in 1973.  Unsure if taken orally will cause any reaction  Has patient had a PCN reaction causing immediate rash, facial/tongue/throat swelling, SOB or lightheadedness with hypotension: Yes Has patient had a PCN reaction causing severe rash involving mucus membranes or skin necrosis: No Has patient had a PCN reaction that required hospitalization: No Has patient had a PCN reaction occurring within the last 10 years: No     Antimicrobials this admission: Cefepime 9/29 >>  Vancomycin 9/29 >>   Dose adjustments this admission: n/a  Microbiology results: 9/29 BCx: NGTD 9/29 Sputum: pending  9/29 MRSA PCR: pending  Thank you for allowing pharmacy to be a part of this patient's care.  Rowland Lathe 07/24/2020 4:32 PM

## 2020-07-24 NOTE — ED Notes (Signed)
Report to [place], rn.  

## 2020-07-24 NOTE — ED Notes (Signed)
Resumed care from April, RN. Pt resting comfortably.

## 2020-07-24 NOTE — Consult Note (Signed)
[facility] NOTE  Patient Care Team: Leonel Ramsay, MD as PCP - General (Infectious Diseases) Telford Nab, RN as Registered Nurse Rogue Bussing, Elisha Headland, MD as Medical Oncologist (Medical Oncology) [name], MD as Referring Physician (Physical Medicine and Rehabilitation)  CHIEF COMPLAINTS/PURPOSE OF CONSULTATION: Lung cancer/pneumonia  HISTORY OF PRESENTING ILLNESS:  Brenda Parker 84 y.o.  female female patient with history of COPD; stage IV lung cancer currently on chemo holiday [poor tolerance to therapy] is currently admitted to hospital for worsening pneumonia.  Approximately 5 days ago, patient presented to the emergency room because of worsening cough shortness of breath.  Patient had work-up in the emergency room which showed a chest x-ray concerning for pneumonia.  Patient left the emergency room given the wait time.  Patient was promptly evaluated in the symptom management clinic the next day, and treated with IV antibiotics including ceftriaxone daily x3.  However patient noted not to have significant improvement over the next 2 days.  Given the concerns of intermittent delirium /poor p.o. intake /nausea and diarrhea patient was recommended presentation to the emergency room again.    In the emergency room patient's oxygen saturations were 88%; tachycardic; no fevers  Negative Covid test/flu test.  Patient is currently on broad-spectrum antibiotics IV fluids.  She feels slightly improved.  Review of Systems  Constitutional: Positive for diaphoresis, malaise/fatigue and weight loss. Negative for chills and fever.  HENT: Negative for nosebleeds and sore throat.   Eyes: Negative for double vision.  Respiratory: Positive for cough, sputum production, shortness of breath and wheezing. Negative for hemoptysis.   Cardiovascular: Negative for chest pain, palpitations, orthopnea and leg swelling.  Gastrointestinal: Positive for constipation. Negative  for abdominal pain, blood in stool, diarrhea, heartburn, melena, nausea and vomiting.  Genitourinary: Negative for dysuria, frequency and urgency.  Musculoskeletal: Positive for back pain and joint pain.  Skin: Negative.  Negative for itching and rash.  Neurological: Positive for dizziness. Negative for tingling, focal weakness, weakness and headaches.  Endo/Heme/Allergies: Does not bruise/bleed easily.  Psychiatric/Behavioral: Negative for depression. The patient is not nervous/anxious and does not have insomnia.        Intermittent confusion.     MEDICAL HISTORY:  Past Medical History:  Diagnosis Date  . Benign liver cyst   . Breast cancer ([place]) 2005   right breast  . COPD (chronic obstructive pulmonary disease) ([place])   . Dyspnea   . Glaucoma   . Hemoptysis 2019  . Hypertension    Not on medication for htn. patient denies this  . Lung mass 01/2018  . Personal history of radiation therapy     SURGICAL HISTORY: Past Surgical History:  Procedure Laterality Date  . BREAST EXCISIONAL BIOPSY Right 2005   positive, radiation  . BREAST LUMPECTOMY Right 03/2004  . CHOLECYSTECTOMY  1988  . COLONOSCOPY  2012  . ENDOBRONCHIAL ULTRASOUND N/A 02/21/2018   Procedure: ENDOBRONCHIAL ULTRASOUND;  Surgeon: Laverle Hobby, MD;  Location: [facility] ORS;  Service: Pulmonary;  Laterality: N/A;  . EYE SURGERY Bilateral 2009,2010   cataract extractions with lens implant  . GALLBLADDER SURGERY Bilateral   . IR FLUORO GUIDE PORT INSERTION RIGHT  03/10/2018  . JOINT REPLACEMENT Left 2008   left hip replacement  . PORTACATH PLACEMENT  03/10/2018  . TONSILLECTOMY  1960    SOCIAL HISTORY: Social History   Socioeconomic History  . Marital status: Widowed    Spouse name: Not on file  . Number of children: Not on file  .  Years of education: Not on file  . Highest education level: Not on file  Occupational History  . Not on file  Tobacco Use  . Smoking status: Former Smoker    Packs/day:  1.00    Types: Cigarettes    Quit date: 10/25/2005    Years since quitting: 14.7  . Smokeless tobacco: Never Used  Vaping Use  . Vaping Use: Never used  Substance and Sexual Activity  . Alcohol use: Not Currently    Comment: occasional glass of beer  . Drug use: Never  . Sexual activity: Not Currently  Other Topics Concern  . Not on file  Social History Narrative  . Not on file   Social Determinants of Health   Financial Resource Strain:   . Difficulty of Paying Living Expenses: Not on file  Food Insecurity:   . Worried About Charity fundraiser in the Last Year: Not on file  . Ran Out of Food in the Last Year: Not on file  Transportation Needs:   . Lack of Transportation (Medical): Not on file  . Lack of Transportation (Non-Medical): Not on file  Physical Activity:   . Days of Exercise per Week: Not on file  . Minutes of Exercise per Session: Not on file  Stress:   . Feeling of Stress : Not on file  Social Connections:   . Frequency of Communication with Friends and Family: Not on file  . Frequency of Social Gatherings with Friends and Family: Not on file  . Attends Religious Services: Not on file  . Active Member of Clubs or Organizations: Not on file  . Attends Archivist Meetings: Not on file  . Marital Status: Not on file  Intimate Partner Violence:   . Fear of Current or Ex-Partner: Not on file  . Emotionally Abused: Not on file  . Physically Abused: Not on file  . Sexually Abused: Not on file    FAMILY HISTORY: Family History  Problem Relation Age of Onset  . Breast cancer Mother 65  . CAD Father     ALLERGIES:  is allergic to penicillins.  MEDICATIONS:  Current Facility-Administered Medications  Medication Dose Route Frequency Provider Last Rate Last Admin  . 0.9 %  sodium chloride infusion  250 mL Intravenous PRN Val Riles, MD      . acetaminophen (TYLENOL) tablet 650 mg  650 mg Oral Q6H PRN Val Riles, MD   650 mg at 07/24/20 1424    Or  . acetaminophen (TYLENOL) suppository 650 mg  650 mg Rectal Q6H PRN Val Riles, MD      . albuterol (PROVENTIL) (2.5 MG/3ML) 0.083% nebulizer solution 2.5 mg  2.5 mg Nebulization Q2H PRN Val Riles, MD      . albuterol (VENTOLIN HFA) 108 (90 Base) MCG/ACT inhaler 2 puff  2 puff Inhalation Q6H PRN Val Riles, MD      . ceFEPIme (MAXIPIME) 2 g in sodium chloride 0.9 % 100 mL IVPB  2 g Intravenous BID Val Riles, MD   Stopped at 07/24/20 [phone]  . cholecalciferol (VITAMIN D3) tablet 2,000 Units  2,000 Units Oral Daily Val Riles, MD   2,000 Units at 07/24/20 1026  . enoxaparin (LOVENOX) injection 40 mg  40 mg Subcutaneous Q24H Val Riles, MD   40 mg at 07/23/20 2135  . umeclidinium bromide (INCRUSE ELLIPTA) 62.5 MCG/INH 1 puff  1 puff Inhalation Daily Val Riles, MD   1 puff at 07/24/20 1215   And  . fluticasone  furoate-vilanterol (BREO ELLIPTA) 100-25 MCG/INH 1 puff  1 puff Inhalation Daily Val Riles, MD   1 puff at 07/24/20 1215  . gabapentin (NEURONTIN) capsule 100 mg  100 mg Oral TID Val Riles, MD   100 mg at 07/24/20 1026  . guaiFENesin (MUCINEX) 12 hr tablet 600 mg  600 mg Oral BID Val Riles, MD   600 mg at 07/24/20 1026  . guaiFENesin-dextromethorphan (ROBITUSSIN DM) 100-10 MG/5ML syrup 5 mL  5 mL Oral Q6H PRN Val Riles, MD      . hydrALAZINE (APRESOLINE) injection 10 mg  10 mg Intravenous Q6H PRN Val Riles, MD      . ipratropium (ATROVENT) nebulizer solution 0.5 mg  0.5 mg Nebulization Q6H Val Riles, MD   0.5 mg at 07/24/20 1428  . latanoprost (XALATAN) 0.005 % ophthalmic solution 1 drop  1 drop Both Eyes QHS Val Riles, MD      . ondansetron [facility]) tablet 4 mg  4 mg Oral Q6H PRN Val Riles, MD       Or  . ondansetron [facility]) injection 4 mg  4 mg Intravenous Q6H PRN Val Riles, MD      . oxyCODONE (Oxy IR/ROXICODONE) immediate release tablet 5 mg  5 mg Oral BID PRN Val Riles, MD   5 mg at 07/24/20 1209  . polyethylene glycol  (MIRALAX / GLYCOLAX) packet 17 g  17 g Oral Daily PRN Val Riles, MD      . sodium chloride flush (NS) 0.9 % injection 3 mL  3 mL Intravenous Q12H Dwyane Dee, Dileep, MD      . sodium chloride flush (NS) 0.9 % injection 3 mL  3 mL Intravenous PRN Val Riles, MD      . vancomycin (VANCOREADY) IVPB 500 mg/100 mL  500 mg Intravenous Q12H Benita Gutter, [facility]   Stopped at 07/24/20 1131   Current Outpatient Medications  Medication Sig Dispense Refill  . albuterol (PROVENTIL HFA) 108 (90 Base) MCG/ACT inhaler Inhale 2 puffs into the lungs every 6 (six) hours as needed for wheezing or shortness of breath.     . Cholecalciferol (VITAMIN D3) 1000 units CAPS Take 2,000 Units by mouth daily.     . fluocinolone (SYNALAR) 0.01 % external solution Apply topically 2 (two) times daily.    Marland Kitchen gabapentin (NEURONTIN) 100 MG capsule Take 100 mg by mouth 3 (three) times daily.     Marland Kitchen latanoprost (XALATAN) 0.005 % ophthalmic solution Place 1 drop into both eyes at bedtime.     . ondansetron (ZOFRAN) 8 MG tablet Take 8 mg by mouth every 8 (eight) hours as needed for nausea or vomiting.    Marland Kitchen oxyCODONE (OXY IR/ROXICODONE) 5 MG immediate release tablet Take 1 tablet (5 mg total) by mouth 2 (two) times daily as needed for moderate pain or severe pain. 45 tablet 0  . TRELEGY ELLIPTA 100-62.5-25 MCG/INH AEPB Take 1 puff by mouth daily.     Marland Kitchen azithromycin (ZITHROMAX) 250 MG tablet Take 2 tablets on day 1 (500mg ). Take 1 tablet (250 mg) days 2-5. 6 each 0  . predniSONE (DELTASONE) 20 MG tablet Take 2 tablets (40 mg total) by mouth daily with breakfast. (Patient taking differently: Take 40 mg by mouth 2 (two) times daily with a meal. ) 20 tablet 0   Facility-Administered Medications Ordered in Other Encounters  Medication Dose Route Frequency Provider Last Rate Last Admin  . sodium chloride flush (NS) 0.9 % injection 10 mL  10 mL Intravenous PRN Lexany Belknap,  Elisha Headland, MD   10 mL at 05/02/20 0905      .  PHYSICAL  EXAMINATION:  Vitals:   07/24/20 1330 07/24/20 1400  BP: (!) 143/75 (!) 147/79  Pulse: 86 87  Resp:    Temp:    SpO2: 97% 97%   Filed Weights   07/23/20 1750  Weight: 133 lb (60.3 kg)    Physical Exam Constitutional:      Comments: Patient resting in the ER stretcher no acute distress.  She is currently getting a breathing treatment.  She is alone.  HENT:     Head: Normocephalic and atraumatic.     Mouth/Throat:     Pharynx: No oropharyngeal exudate.  Eyes:     Pupils: Pupils are equal, round, and reactive to light.  Cardiovascular:     Rate and Rhythm: Normal rate and regular rhythm.  Pulmonary:     Effort: No respiratory distress.     Breath sounds: No wheezing.     Comments: Decreased breath sounds bilaterally.  Positive for scattered wheeze.  Positive for basilar crackles. Abdominal:     General: Bowel sounds are normal. There is no distension.     Palpations: Abdomen is soft. There is no mass.     Tenderness: There is no abdominal tenderness. There is no guarding or rebound.  Musculoskeletal:        General: No tenderness. Normal range of motion.     Cervical back: Normal range of motion and neck supple.  Skin:    General: Skin is warm.  Neurological:     Mental Status: She is alert and oriented to person, place, and time.  Psychiatric:        Mood and Affect: Affect normal.      LABORATORY DATA:  I have reviewed the data as listed Lab Results  Component Value Date   WBC 9.3 07/24/2020   HGB 8.5 (L) 07/24/2020   HCT 26.1 (L) 07/24/2020   MCV 86.4 07/24/2020   PLT 311 07/24/2020   Recent Labs    06/27/20 0924 06/27/20 0924 07/21/20 2018 07/23/20 1134 07/23/20 1805 07/24/20 0357  NA 138   < > 134* 138  --  139  K 4.1   < > 3.3* 3.2*  --  4.1  CL 104   < > 103 106  --  108  CO2 23   < > 18* 20*  --  24  GLUCOSE 133*   < > 133* 143*  --  139*  BUN 21   < > 27* 31*  --  30*  CREATININE 1.23*   < > 1.07* 0.79  --  0.91  CALCIUM 9.0   < > 8.9 8.8*   --  8.9  GFRNONAA 41*   < > 48* >60  --  59*  GFRAA 47*   < > 56* >60  --  >60  PROT 7.9  --  8.0  --  8.0  --   ALBUMIN 3.2*  --  2.8*  --  2.6*  --   AST 15  --  14*  --  56*  --   ALT 10  --  12  --  33  --   ALKPHOS 88  --  82  --  96  --   BILITOT 0.4  --  0.9  --  0.6  --   BILIDIR  --   --   --   --  0.1  --   IBILI  --   --   --   --  0.5  --    < > = values in this interval not displayed.    RADIOGRAPHIC STUDIES: I have personally reviewed the radiological images as listed and agreed with the findings in the report. DG Chest 2 View  Result Date: 07/21/2020 CLINICAL DATA:  Shortness of breath, COPD and lung cancer, not currently receiving treatments EXAM: CHEST - 2 VIEW COMPARISON:  CT 06/25/2020, radiograph 07/06/2018 FINDINGS: Increasing opacity in cavitary lesion within the right upper lobe with additional nodular opacities and architectural distortion in the aerated portions of the right lung more inferiorly. Chronic interstitial changes and bronchitic features in the otherwise clear left lung are similar to comparison and likely reflect patient's underlying COPD/emphysema better assessed on cross-sectional imaging. Some increasing hazy opacities in the lung bases could reflect some atelectatic change, early edema or infection. Left IJ approach Port-A-Cath tip terminates in the mid to lower SVC. The aorta is calcified. The remaining cardiomediastinal contours are unremarkable. No acute osseous or soft tissue abnormality. Degenerative changes are present in the imaged spine and shoulders. IMPRESSION: 1. Increasing opacity with underlying cavitary lesion within the right upper lobe as well as additional nodular opacities and architectural distortion in the aerated portions of the right lung more inferiorly. Features compatible with patient's known intrathoracic malignancy and pulmonary metastatic disease. 2. Background of coarsened interstitial and bronchitic changes compatible with  history of COPD. 3. Some increasing faint, hazy opacities in the lung bases could atelectasis, early edema or infection. 4.  Aortic Atherosclerosis (ICD10-I70.0). Electronically Signed   By: Lovena Le M.D.   On: 07/21/2020 21:03   CT Chest W Contrast  Result Date: 06/25/2020 CLINICAL DATA:  Lung cancer, assess treatment response. Treated with radiation therapy. Currently on chemotherapy. Fatigue. History of breast cancer. EXAM: CT CHEST WITH CONTRAST TECHNIQUE: Multidetector CT imaging of the chest was performed during intravenous contrast administration. CONTRAST:  35mL OMNIPAQUE IOHEXOL 300 MG/ML  SOLN COMPARISON:  03/31/2020.  MR thoracic spine 05/27/2020. FINDINGS: Cardiovascular: Left IJ Port-A-Cath terminates in the SVC. Atherosclerotic calcification of the aorta, aortic valve and coronary arteries. Heart size within normal limits. No pericardial effusion. Mediastinum/Nodes: No pathologically enlarged mediastinal, left hilar or axillary lymph nodes. Esophagus is grossly unremarkable. Lungs/Pleura: Heterogeneous collapse/consolidation in the right upper lobe with extension to the central right middle lobe and right lower lobe. Cavitary appearing component in the superior segment right lower lobe (2/47), as before. Right middle lobe nodule is stable, measuring 1.2 x 1.3 cm (3/75). Multiple right lower lobe nodules are minimally smaller. Index nodule measures 8 x 11 mm (3/85), compared to 11 x 13 mm on 03/31/2020. Areas of volume loss and scarring in the right middle lobe and right lower lobe. No left pleural effusion. Centrilobular emphysema. There is irregularity of the right mainstem bronchus. Airway is otherwise unremarkable. Upper Abdomen: Low-attenuation exophytic mass off segment 3 of the liver has peripheral calcification and is stable in size, 4.9 x 4.9 cm. Additional low-attenuation lesions in the liver measure up to 3.3 x 4.0 cm in the dome, stable. Right adrenal gland is unremarkable. Slight  thickening of the left adrenal gland. Visualized portion of the right kidney is unremarkable. Probable 1.4 cm left renal sinus cyst. Visualized portions of the spleen, pancreas, stomach and bowel are grossly unremarkable. Musculoskeletal: Degenerative changes in the spine. No worrisome lytic or sclerotic lesions. Peripherally calcified ovoid low-density lesion in the right breast measures 1.2 x 2.5 cm, unchanged. Healed fractures of the right first second and third anterior ribs.  IMPRESSION: 1. Post treatment collapse/consolidation in the upper right hemithorax with a cavitary component in the right lower lobe, similar to 03/31/2020. 2. Right middle right lower lobe nodules are stable to minimally smaller in size. 3. Low-attenuation lesions in the liver, as described above, unchanged. 4. Aortic atherosclerosis (ICD10-I70.0). Coronary artery calcification. 5. Emphysema (ICD10-J43.9). Electronically Signed   By: Lorin Picket M.D.   On: 06/25/2020 13:32   CT ANGIO CHEST PE W OR WO CONTRAST  Result Date: 07/23/2020 CLINICAL DATA:  Metastatic lung cancer, assess treatment response, possible pneumonia EXAM: CT ANGIOGRAPHY CHEST WITH CONTRAST TECHNIQUE: Multidetector CT imaging of the chest was performed using the standard protocol during bolus administration of intravenous contrast. Multiplanar CT image reconstructions and MIPs were obtained to evaluate the vascular anatomy. CONTRAST:  33mL OMNIPAQUE IOHEXOL 350 MG/ML SOLN, <See Chart> OMNIPAQUE IOHEXOL 300 MG/ML SOLN, 9mL OMNIPAQUE IOHEXOL 350 MG/ML SOLN COMPARISON:  06/25/2020 FINDINGS: Cardiovascular: Left chest port catheter. Satisfactory opacification of the left pulmonary arteries to the segmental level. No evidence of pulmonary embolism. 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 (Series 11, image 56). Normal heart size. No pericardial effusion. Aortic atherosclerosis. Extensive 3 vessel coronary artery  calcifications and/or stents. Mediastinum/Nodes: No discretely enlarged mediastinal, hilar, or axillary lymph nodes. Interval increase in ill-defined left paratracheal soft tissue (series 11, image 41). Thyroid gland, trachea, and esophagus demonstrate no significant findings. Lungs/Pleura: Redemonstrated dense consolidation of the right upper lobe with a large cavitary lesion containing an air and fluid level, centered about the superior segment right lower lobe, increased in size compared to prior examination, measuring approximately 4.9 x 3.8 cm, previously 4.1 x 3.1 cm (series 6, image 51). There are multiple small right pulmonary nodules and cavitary lesions, which are similar to prior examination, an index nodule of the right lower lobe measuring 1.2 x 0.9 cm, not significantly changed (series 13, image 79). There is extensive new superimposed heterogeneous and ground-glass airspace opacity throughout the lungs. Severe diffuse bilateral bronchial wall thickening. Trace right pleural effusion. Upper Abdomen: No acute abnormality. Multiple hypodense liver lesions, incompletely imaged and incompletely characterized although not grossly changed compared to prior examination (series 11, image 104). Musculoskeletal: No chest wall abnormality. No acute or significant osseous findings. Review of the MIP images confirms the above findings. IMPRESSION: 1. Negative examination for left-sided pulmonary embolism. The right pulmonary artery is again occluded in the distal vessels are non-opacified. 2. Redemonstrated dense consolidation of the right upper lobe with a large cavitary lesion containing an air and fluid level, increased in size compared to prior examination, measuring approximately 4.9 x 3.8 cm, previously 4.1 x 3.1 cm. Findings are consistent with worsened malignancy. 3. Multiple small right pulmonary nodules and cavitary lesions, which are similar to prior examination. 4. There is extensive new superimposed  heterogeneous and ground-glass airspace opacity throughout the lungs. Severe diffuse bilateral bronchial wall thickening. Trace right pleural effusion. Findings are consistent with nonspecific infection or inflammation, including COVID pneumonia if clinically suspected. 5. Multiple hypodense liver lesions, incompletely imaged and incompletely characterized although not grossly changed compared to prior examination. 6. Coronary artery disease.  Aortic Atherosclerosis (ICD10-I70.0). Electronically Signed   By: Eddie Candle M.D.   On: 07/23/2020 15:19    Primary cancer of right upper lobe of lung [facility]) #84 year old female patient with metastatic/stage IV lung cancer currently on ""chemo holiday" is currently admitted hospital for worsening shortness of breath/cough; CT scan suggestive of pneumonia.  #Acute  on chronic respiratory failure secondary to bilateral pneumonia-failed outpatient-failed outpatient management; currently on broad-spectrum antibiotics.   #Metastatic lung cancer/stage IV-currently on chemo holiday.  September, 29th 2021-shows mildly progressive malignancy; however patient's symptoms are likely secondary to pneumonia.    #Poor appetite weight loss-likely secondary to above infectious etiology  #Delirium intermittent-secondary to above pneumonia.  #DNR/DNI  Recommendations:   #Agree with current management of bilateral pneumonia-broad-spectrum antibiotics/nebulization.   #Recommend continue holding therapy for lung cancer.  CT scan June 25, 2020-showed slightly progressive disease however patient not poor candidate for further chemotherapy.  This was discussed multiple times with the patient family.  After resolution of acute issues-this could be readdressed/hospice could be recommended.  Thank you Dr.Lai for allowing me to participate in the care of your pleasant patient. Please do not hesitate to contact me with questions or concerns in the interim.  # I reviewed the  blood work- also reviewed the imaging independently [as summarized above]; and with the patient/son in detail.   # I reviewed the blood work- with the patient in detail; also reviewed the imaging independently [as summarized above]; and with the patient in detail.   All questions were answered. The patient knows to call the clinic with any problems, questions or concerns.    [name], MD 07/24/2020 2:33 PM

## 2020-07-24 NOTE — ED Notes (Signed)
Pt is currently dry, states she thinks she will have to urinate soon. Pt informed has external cath in place. Pt verbalizes understanding.

## 2020-07-24 NOTE — ED Notes (Signed)
Pt repositioned; upon exertion, O2 sat dropped to 86%. Pt encouraged to breathe in through nose, saturation returned to 90%.

## 2020-07-24 NOTE — ED Notes (Signed)
ED TO INPATIENT HANDOFF REPORT  ED Nurse Name and Phone #: [phone]  S Name/Age/Gender Brenda Parker 84 y.o. female Room/Bed: ED33A/ED33A  Code Status   Code Status: DNR  Home/SNF/Other Home Patient oriented to: self, place, time and situation Is this baseline? Yes   Triage Complete: Triage complete  Chief Complaint Pneumonia [J18.9]  Triage Note Pt here for [facility]. Was here Monday and left before seen.  She is a cancer pt and her cancer doctor has been treating her with IV abx in the office this week for PNA as well given oral abx.  Pt has not been getting better despite this.  Per son has not eaten all week.  Mucous membrane dry.  Fever earlier in the week.  Tachypnea noted but unlabored at this time.  Intermittent AMS per son; he reports yesterday was asking for an aunt that passed away 20 years ago.  CTA done earlier today as well as cbc and bmp    Allergies Allergies  Allergen Reactions   Penicillins Rash    Patient had a rash after an injection in 1973.  Unsure if taken orally will cause any reaction  Has patient had a PCN reaction causing immediate rash, facial/tongue/throat swelling, SOB or lightheadedness with hypotension: Yes Has patient had a PCN reaction causing severe rash involving mucus membranes or skin necrosis: No Has patient had a PCN reaction that required hospitalization: No Has patient had a PCN reaction occurring within the last 10 years: No     Level of Care/Admitting Diagnosis ED Disposition    ED Disposition Condition [place]: [place] [[id number]]  Level of Care: Med-Surg [16]  Covid Evaluation: Asymptomatic Screening Protocol (No Symptoms)  Diagnosis: Pneumonia [098119]  Admitting Physician: Val Riles [[id number]]  Attending Physician: Val Riles [[id number]]  Estimated length of stay: 3 - 4 days  Certification:: I certify this patient will need inpatient services for at least 2 midnights        B Medical/Surgery History Past Medical History:  Diagnosis Date   Benign liver cyst    Breast cancer ([place]) 2005   right breast   COPD (chronic obstructive pulmonary disease) ([place])    Dyspnea    Glaucoma    Hemoptysis 2019   Hypertension    Not on medication for htn. patient denies this   Lung mass 01/2018   Personal history of radiation therapy    Past Surgical History:  Procedure Laterality Date   BREAST EXCISIONAL BIOPSY Right 2005   positive, radiation   BREAST LUMPECTOMY Right 03/2004   CHOLECYSTECTOMY  1988   COLONOSCOPY  2012   ENDOBRONCHIAL ULTRASOUND N/A 02/21/2018   Procedure: ENDOBRONCHIAL ULTRASOUND;  Surgeon: Laverle Hobby, MD;  Location: [facility] ORS;  Service: Pulmonary;  Laterality: N/A;   EYE SURGERY Bilateral 2009,2010   cataract extractions with lens implant   GALLBLADDER SURGERY Bilateral    IR FLUORO GUIDE PORT INSERTION RIGHT  03/10/2018   JOINT REPLACEMENT Left 2008   left hip replacement   PORTACATH PLACEMENT  03/10/2018   TONSILLECTOMY  1960     A IV Location/Drains/Wounds Patient Lines/Drains/Airways Status    Active Line/Drains/Airways    Name Placement date Placement time Site Days   Implanted Port Left Chest --  --  Chest     Peripheral IV 07/23/20 Left Antecubital 07/23/20  1412  Antecubital  1   Peripheral IV 07/23/20 Left Arm 07/23/20  1808  Arm  1   Peripheral  IV 07/23/20 Distal;Posterior;Right Forearm 07/23/20  --  Forearm  1   External Urinary Catheter 07/23/20  --  --  1          Intake/Output Last 24 hours  Intake/Output Summary (Last 24 hours) at 07/24/2020 1701 Last data filed at 07/24/2020 1213 Gross per 24 hour  Intake 398.34 ml  Output 800 ml  Net -401.66 ml    Labs/Imaging Results for orders placed or performed during the hospital encounter of 07/23/20 (from the past 48 hour(s))  Protime-INR     Status: None   Collection Time: 07/23/20  4:05 PM  Result Value Ref Range   Prothrombin  Time 13.1 11.4 - 15.2 seconds   INR 1.0 0.8 - 1.2    Comment: (NOTE) INR goal varies based on device and disease states. Performed at [facility], [address] [place], [place] [address]   Lactic acid, plasma     Status: None   Collection Time: 07/23/20  6:05 PM  Result Value Ref Range   Lactic Acid, Venous 1.6 0.5 - 1.9 mmol/L    Comment: Performed at [facility], [address] [place], [place] [address]  Culture, blood (Routine x 2)     Status: None (Preliminary result)   Collection Time: 07/23/20  6:05 PM   Specimen: BLOOD  Result Value Ref Range   Specimen Description BLOOD BLOOD LEFT ARM    Special Requests      BOTTLES DRAWN AEROBIC AND ANAEROBIC Blood Culture adequate volume   Culture      NO GROWTH < 12 HOURS Performed at [facility], [address], [place], [place] [address]    Report Status PENDING   Hepatic function panel     Status: Abnormal   Collection Time: 07/23/20  6:05 PM  Result Value Ref Range   Total Protein 8.0 6.5 - 8.1 g/dL   Albumin 2.6 (L) 3.5 - 5.0 g/dL   AST 56 (H) 15 - 41 U/L   ALT 33 0 - 44 U/L   Alkaline Phosphatase 96 38 - 126 U/L   Total Bilirubin 0.6 0.3 - 1.2 mg/dL   Bilirubin, Direct 0.1 0.0 - 0.2 mg/dL   Indirect Bilirubin 0.5 0.3 - 0.9 mg/dL    Comment: Performed at [facility], [address] [place], [place] [address]  Culture, blood (Routine x 2)     Status: None (Preliminary result)   Collection Time: 07/23/20  6:06 PM   Specimen: BLOOD  Result Value Ref Range   Specimen Description BLOOD BLOOD RIGHT ARM    Special Requests      BOTTLES DRAWN AEROBIC AND ANAEROBIC Blood Culture adequate volume   Culture      NO GROWTH < 12 HOURS Performed at [facility], [address], [place], [place] [address]    Report Status PENDING   Lactic acid, plasma     Status: None   Collection Time: 07/23/20  8:02 PM  Result Value Ref Range   Lactic Acid, Venous 1.3 0.5 - 1.9 mmol/L     Comment: Performed at [facility], [address] [place], [place] [address]  Respiratory Panel by RT PCR (Flu A&B, Covid) - Nasopharyngeal Swab     Status: None   Collection Time: 07/23/20  8:02 PM   Specimen: Nasopharyngeal Swab  Result Value Ref Range   SARS Coronavirus 2 by RT PCR NEGATIVE NEGATIVE    Comment: (NOTE) SARS-CoV-2 target nucleic acids are NOT DETECTED.  The SARS-CoV-2 RNA is generally detectable in upper respiratoy specimens during the acute phase of infection. The lowest concentration of SARS-CoV-2 viral copies this assay can detect is 131 copies/mL. A negative result does not preclude SARS-Cov-2 infection and should not be used as the sole basis for treatment or other patient management decisions. A negative result may occur with  improper specimen collection/handling, submission of specimen other than nasopharyngeal swab, presence of viral mutation(s) within the areas targeted by this assay, and inadequate number of viral copies (<131 copies/mL). A negative result must be combined with clinical observations, patient history, and epidemiological information. The expected result is Negative.  Fact Sheet for Patients:  PinkCheek.be  Fact Sheet for Healthcare Providers:  GravelBags.it  This test is no t yet approved or cleared by the [place] FDA and  has been authorized for detection and/or diagnosis of SARS-CoV-2 by FDA under an Emergency Use Authorization (EUA). This EUA will remain  in effect (meaning this test can be used) for the duration of the COVID-19 declaration under Section 564(b)(1) of the Act, 21 U.S.C. section 360bbb-3(b)(1), unless the authorization is terminated or revoked sooner.     Influenza A by PCR NEGATIVE NEGATIVE   Influenza B by PCR NEGATIVE NEGATIVE    Comment: (NOTE) The Xpert Xpress SARS-CoV-2/FLU/RSV assay is intended as an aid in  the diagnosis of  influenza from Nasopharyngeal swab specimens and  should not be used as a sole basis for treatment. Nasal washings and  aspirates are unacceptable for Xpert Xpress SARS-CoV-2/FLU/RSV  testing.  Fact Sheet for Patients: PinkCheek.be  Fact Sheet for Healthcare Providers: GravelBags.it  This test is not yet approved or cleared by the [place] FDA and  has been authorized for detection and/or diagnosis of SARS-CoV-2 by  FDA under an Emergency Use Authorization (EUA). This EUA will remain  in effect (meaning this test can be used) for the duration of the  Covid-19 declaration under Section 564(b)(1) of the Act, 21  U.S.C. section 360bbb-3(b)(1), unless the authorization is  terminated or revoked. Performed at [facility], [address] [place], [place] [address]   CBC     Status: Abnormal   Collection Time: 07/24/20  3:57 AM  Result Value Ref Range   WBC 9.3 4.0 - 10.5 K/uL   RBC 3.02 (L) 3.87 - 5.11 MIL/uL   Hemoglobin 8.5 (L) 12.0 - 15.0 g/dL   HCT 26.1 (L) 36 - 46 %   MCV 86.4 80.0 - 100.0 fL   MCH 28.1 26.0 - 34.0 pg   MCHC 32.6 30.0 - 36.0 g/dL   RDW 16.7 (H) 11.5 - 15.5 %   Platelets 311 150 - 400 K/uL   nRBC 0.0 0.0 - 0.2 %    Comment: Performed at [facility], [address] [place], [place] [address]  Basic metabolic panel     Status: Abnormal   Collection Time: 07/24/20  3:57 AM  Result Value Ref Range   Sodium 139 135 - 145 mmol/L   Potassium 4.1 3.5 - 5.1 mmol/L   Chloride 108 98 - 111 mmol/L   CO2 24 22 - 32 mmol/L   Glucose, Bld 139 (H) 70 - 99 mg/dL    Comment: Glucose reference range applies only to samples taken after fasting for at least 8 hours.   BUN 30 (H) 8 - 23 mg/dL   Creatinine, Ser 0.91 0.44 - 1.00 mg/dL   Calcium 8.9 8.9 - 10.3 mg/dL   GFR calc  non Af Amer 59 (L) >60 mL/min   GFR calc Af Amer >60 >60 mL/min   Anion gap 7 5 - 15    Comment: Performed at  [facility], [address] [place], [place] [address]  Magnesium     Status: None   Collection Time: 07/24/20  3:57 AM  Result Value Ref Range   Magnesium 1.9 1.7 - 2.4 mg/dL    Comment: Performed at [facility], [address], [place], [place] [address]  Phosphorus     Status: None   Collection Time: 07/24/20  3:57 AM  Result Value Ref Range   Phosphorus 3.1 2.5 - 4.6 mg/dL    Comment: Performed at [facility], [address] [place], [place] [address]  Procalcitonin - Baseline     Status: None   Collection Time: 07/24/20  3:57 AM  Result Value Ref Range   Procalcitonin 1.93 ng/mL    Comment:        Interpretation: PCT > 0.5 ng/mL and <= 2 ng/mL: Systemic infection (sepsis) is possible, but other conditions are known to elevate PCT as well. (NOTE)       Sepsis PCT Algorithm           Lower Respiratory Tract                                      Infection PCT Algorithm    ----------------------------     ----------------------------         PCT < 0.25 ng/mL                PCT < 0.10 ng/mL          Strongly encourage             Strongly discourage   discontinuation of antibiotics    initiation of antibiotics    ----------------------------     -----------------------------       PCT 0.25 - 0.50 ng/mL            PCT 0.10 - 0.25 ng/mL               OR       >80% decrease in PCT            Discourage initiation of                                            antibiotics      Encourage discontinuation           of antibiotics    ----------------------------     -----------------------------         PCT >= 0.50 ng/mL              PCT 0.26 - 0.50 ng/mL                AND       <80% decrease in PCT             Encourage initiation of                                             antibiotics  Encourage continuation           of antibiotics    ----------------------------     -----------------------------        PCT >= 0.50 ng/mL                   PCT > 0.50 ng/mL               AND         increase in PCT                  Strongly encourage                                      initiation of antibiotics    Strongly encourage escalation           of antibiotics                                     -----------------------------                                           PCT <= 0.25 ng/mL                                                 OR                                        > 80% decrease in PCT                                      Discontinue / Do not initiate                                             antibiotics  Performed at [facility], [address] [place], [place] [address]   Urinalysis, Complete w Microscopic Urine, Random     Status: Abnormal   Collection Time: 07/24/20  8:01 AM  Result Value Ref Range   Color, Urine YELLOW (A) YELLOW   APPearance CLEAR (A) CLEAR   Specific Gravity, Urine 1.035 (H) 1.005 - 1.030   pH 5.0 5.0 - 8.0   Glucose, UA NEGATIVE NEGATIVE mg/dL   Hgb urine dipstick NEGATIVE NEGATIVE   Bilirubin Urine NEGATIVE NEGATIVE   Ketones, ur NEGATIVE NEGATIVE mg/dL   Protein, ur 30 (A) NEGATIVE mg/dL   Nitrite NEGATIVE NEGATIVE   Leukocytes,Ua NEGATIVE NEGATIVE   RBC / HPF 0-5 0 - 5 RBC/hpf   WBC, UA 0-5 0 - 5 WBC/hpf   Bacteria, UA NONE SEEN NONE SEEN   Squamous Epithelial / LPF 0-5 0 - 5   Mucus PRESENT     Comment: Performed at [facility], [address] [place],  [address]   CT ANGIO CHEST  PE W OR WO CONTRAST  Result Date: 07/23/2020 CLINICAL DATA:  Metastatic lung cancer, assess treatment response, possible pneumonia EXAM: CT ANGIOGRAPHY CHEST WITH CONTRAST TECHNIQUE: Multidetector CT imaging of the chest was performed using the standard protocol during bolus administration of intravenous contrast. Multiplanar CT image reconstructions and MIPs were obtained to evaluate the vascular anatomy. CONTRAST:  72mL OMNIPAQUE IOHEXOL 350 MG/ML SOLN, <See Chart>  OMNIPAQUE IOHEXOL 300 MG/ML SOLN, 39mL OMNIPAQUE IOHEXOL 350 MG/ML SOLN COMPARISON:  06/25/2020 FINDINGS: Cardiovascular: Left chest port catheter. Satisfactory opacification of the left pulmonary arteries to the segmental level. No evidence of pulmonary embolism. 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 (Series 11, image 56). Normal heart size. No pericardial effusion. Aortic atherosclerosis. Extensive 3 vessel coronary artery calcifications and/or stents. Mediastinum/Nodes: No discretely enlarged mediastinal, hilar, or axillary lymph nodes. Interval increase in ill-defined left paratracheal soft tissue (series 11, image 41). Thyroid gland, trachea, and esophagus demonstrate no significant findings. Lungs/Pleura: Redemonstrated dense consolidation of the right upper lobe with a large cavitary lesion containing an air and fluid level, centered about the superior segment right lower lobe, increased in size compared to prior examination, measuring approximately 4.9 x 3.8 cm, previously 4.1 x 3.1 cm (series 6, image 51). There are multiple small right pulmonary nodules and cavitary lesions, which are similar to prior examination, an index nodule of the right lower lobe measuring 1.2 x 0.9 cm, not significantly changed (series 13, image 79). There is extensive new superimposed heterogeneous and ground-glass airspace opacity throughout the lungs. Severe diffuse bilateral bronchial wall thickening. Trace right pleural effusion. Upper Abdomen: No acute abnormality. Multiple hypodense liver lesions, incompletely imaged and incompletely characterized although not grossly changed compared to prior examination (series 11, image 104). Musculoskeletal: No chest wall abnormality. No acute or significant osseous findings. Review of the MIP images confirms the above findings. IMPRESSION: 1. Negative examination for left-sided pulmonary embolism. The right pulmonary artery is  again occluded in the distal vessels are non-opacified. 2. Redemonstrated dense consolidation of the right upper lobe with a large cavitary lesion containing an air and fluid level, increased in size compared to prior examination, measuring approximately 4.9 x 3.8 cm, previously 4.1 x 3.1 cm. Findings are consistent with worsened malignancy. 3. Multiple small right pulmonary nodules and cavitary lesions, which are similar to prior examination. 4. There is extensive new superimposed heterogeneous and ground-glass airspace opacity throughout the lungs. Severe diffuse bilateral bronchial wall thickening. Trace right pleural effusion. Findings are consistent with nonspecific infection or inflammation, including COVID pneumonia if clinically suspected. 5. Multiple hypodense liver lesions, incompletely imaged and incompletely characterized although not grossly changed compared to prior examination. 6. Coronary artery disease.  Aortic Atherosclerosis (ICD10-I70.0). Electronically Signed   By: Eddie Candle M.D.   On: 07/23/2020 15:19    Pending Labs Unresulted Labs (From admission, onward)          Start     Ordered   07/30/20 0500  Creatinine, serum  (enoxaparin (LOVENOX)    CrCl >/= 30 ml/min)  Weekly,   STAT     Comments: while on enoxaparin therapy    07/23/20 2105   07/24/20 1148  MRSA PCR Screening  ONCE - STAT,   STAT        07/24/20 1147   07/23/20 2106  Culture, sputum-assessment  Once,   STAT        07/23/20 2105  Vitals/Pain Today's Vitals   07/24/20 1530 07/24/20 1556 07/24/20 1600 07/24/20 1602  BP: (!) 141/71  135/76   Pulse:    83  Resp:      Temp:      TempSrc:      SpO2:    94%  Weight:      Height:      PainSc:  Asleep      Isolation Precautions No active isolations  Medications Medications  albuterol (VENTOLIN HFA) 108 (90 Base) MCG/ACT inhaler 2 puff (has no administration in time range)  cholecalciferol (VITAMIN D3) tablet 2,000 Units (2,000 Units Oral  Given 07/24/20 1026)  latanoprost (XALATAN) 0.005 % ophthalmic solution 1 drop ( Both Eyes Canceled Entry 07/23/20 2252)  gabapentin (NEURONTIN) capsule 100 mg (100 mg Oral Given 07/24/20 1601)  oxyCODONE (Oxy IR/ROXICODONE) immediate release tablet 5 mg (5 mg Oral Given 07/24/20 1209)  enoxaparin (LOVENOX) injection 40 mg (40 mg Subcutaneous Given 07/23/20 2135)  sodium chloride flush (NS) 0.9 % injection 3 mL (3 mLs Intravenous Not Given 07/24/20 1104)  sodium chloride flush (NS) 0.9 % injection 3 mL (has no administration in time range)  0.9 %  sodium chloride infusion (has no administration in time range)  acetaminophen (TYLENOL) tablet 650 mg (650 mg Oral Given 07/24/20 1424)    Or  acetaminophen (TYLENOL) suppository 650 mg ( Rectal See Alternative 07/24/20 1424)  polyethylene glycol (MIRALAX / GLYCOLAX) packet 17 g (has no administration in time range)  ondansetron (ZOFRAN) tablet 4 mg (has no administration in time range)    Or  ondansetron (ZOFRAN) injection 4 mg (has no administration in time range)  albuterol (PROVENTIL) (2.5 MG/3ML) 0.083% nebulizer solution 2.5 mg (has no administration in time range)  ipratropium (ATROVENT) nebulizer solution 0.5 mg (0.5 mg Nebulization Given 07/24/20 1428)  hydrALAZINE (APRESOLINE) injection 10 mg (has no administration in time range)  ceFEPIme (MAXIPIME) 2 g in sodium chloride 0.9 % 100 mL IVPB (0 g Intravenous Stopped 07/24/20 0854)  guaiFENesin (MUCINEX) 12 hr tablet 600 mg (600 mg Oral Given 07/24/20 1026)  guaiFENesin-dextromethorphan (ROBITUSSIN DM) 100-10 MG/5ML syrup 5 mL (has no administration in time range)  potassium chloride SA (KLOR-CON) CR tablet 40 mEq (40 mEq Oral Not Given 07/24/20 1203)  umeclidinium bromide (INCRUSE ELLIPTA) 62.5 MCG/INH 1 puff (1 puff Inhalation Given 07/24/20 1215)    And  fluticasone furoate-vilanterol (BREO ELLIPTA) 100-25 MCG/INH 1 puff (1 puff Inhalation Given 07/24/20 1215)  vancomycin (VANCOREADY) IVPB 750 mg/150  mL (has no administration in time range)  lactated ringers bolus 1,000 mL (0 mLs Intravenous Stopped 07/23/20 2059)  ceFEPIme (MAXIPIME) 2 g in sodium chloride 0.9 % 100 mL IVPB (0 g Intravenous Stopped 07/23/20 2030)  vancomycin (VANCOREADY) IVPB 1500 mg/300 mL ( Intravenous Stopped 07/23/20 2337)    Mobility walks Moderate fall risk   Focused Assessments    R Recommendations: See Admitting Provider Note  Report given to:   Additional Notes:

## 2020-07-25 ENCOUNTER — Encounter: Payer: Self-pay | Admitting: Student

## 2020-07-25 ENCOUNTER — Telehealth: Payer: Self-pay | Admitting: Internal Medicine

## 2020-07-25 DIAGNOSIS — J189 Pneumonia, unspecified organism: Principal | ICD-10-CM

## 2020-07-25 LAB — MAGNESIUM: Magnesium: 2.1 mg/dL (ref 1.7–2.4)

## 2020-07-25 LAB — BASIC METABOLIC PANEL
Anion gap: 10 (ref 5–15)
BUN: 29 mg/dL — ABNORMAL HIGH (ref 8–23)
CO2: 24 mmol/L (ref 22–32)
Calcium: 9 mg/dL (ref 8.9–10.3)
Chloride: 106 mmol/L (ref 98–111)
Creatinine, Ser: 0.85 mg/dL (ref 0.44–1.00)
GFR calc Af Amer: >60 mL/min (ref >60)
GFR calc non Af Amer: >60 mL/min (ref >60)
Glucose, Bld: 110 mg/dL — ABNORMAL HIGH (ref 70–99)
Potassium: 3.8 mmol/L (ref 3.5–5.1)
Sodium: 140 mmol/L (ref 135–145)

## 2020-07-25 LAB — CBC
HCT: 31.2 % — ABNORMAL LOW (ref 36.0–46.0)
Hemoglobin: 9.5 g/dL — ABNORMAL LOW (ref 12.0–15.0)
MCH: 27.6 pg (ref 26.0–34.0)
MCHC: 30.4 g/dL (ref 30.0–36.0)
MCV: 90.7 fL (ref 80.0–100.0)
Platelets: 372 10*3/uL (ref 150–400)
RBC: 3.44 MIL/uL — ABNORMAL LOW (ref 3.87–5.11)
RDW: 16.9 % — ABNORMAL HIGH (ref 11.5–15.5)
WBC: 10.8 10*3/uL — ABNORMAL HIGH (ref 4.0–10.5)
nRBC: 0 % (ref 0.0–0.2)

## 2020-07-25 MED ORDER — INFLUENZA VAC A&B SA ADJ QUAD 0.5 ML IM PRSY
0.5000 mL | PREFILLED_SYRINGE | INTRAMUSCULAR | Status: DC
  Filled 2020-07-25 (×2): qty 0.5

## 2020-07-25 MED ORDER — ENSURE MAX PROTEIN PO LIQD
11.0000 [oz_av] | Freq: Every day | ORAL | Status: DC
  Administered 2020-07-27: 11 [oz_av] via ORAL
  Filled 2020-07-25: qty 330

## 2020-07-25 MED ORDER — CHLORHEXIDINE GLUCONATE CLOTH 2 % EX PADS
6.0000 | MEDICATED_PAD | Freq: Every day | CUTANEOUS | Status: DC
  Administered 2020-07-25 – 2020-07-30 (×5): 6 via TOPICAL

## 2020-07-25 MED ORDER — ALBUTEROL SULFATE (2.5 MG/3ML) 0.083% IN NEBU
2.5000 mg | INHALATION_SOLUTION | RESPIRATORY_TRACT | Status: DC | PRN
  Administered 2020-07-27: 2.5 mg via RESPIRATORY_TRACT
  Filled 2020-07-25: qty 3

## 2020-07-25 MED ORDER — PNEUMOCOCCAL VAC POLYVALENT 25 MCG/0.5ML IJ INJ
0.5000 mL | INJECTION | INTRAMUSCULAR | Status: DC
  Filled 2020-07-25: qty 0.5

## 2020-07-25 MED ORDER — IPRATROPIUM-ALBUTEROL 0.5-2.5 (3) MG/3ML IN SOLN
3.0000 mL | Freq: Four times a day (QID) | RESPIRATORY_TRACT | Status: DC
  Administered 2020-07-25 – 2020-07-26 (×3): 3 mL via RESPIRATORY_TRACT
  Filled 2020-07-25 (×2): qty 3

## 2020-07-25 NOTE — Plan of Care (Signed)

## 2020-07-25 NOTE — Progress Notes (Signed)
Initial Nutrition Assessment  DOCUMENTATION CODES:   Not applicable  INTERVENTION:  CIB po twice daily with meals, each supplement with 237 ml whole milk provides 280 kcal and 13 grams of protein  Magic cup daily with lunch meal, each supplement provides 290 kcal and 9 grams of protein  Decrease Ensure Max to daily d/t pt preference  Encouraged po intake of meals and supplements  Education provided   NUTRITION DIAGNOSIS:   Inadequate oral intake related to decreased appetite as evidenced by per patient/family report.    GOAL:   Patient will meet greater than or equal to 90% of their needs    MONITOR:   PO intake, Supplement acceptance, Weight trends, Labs, I & O's  REASON FOR ASSESSMENT:   Malnutrition Screening Tool    ASSESSMENT:  84 year old female with history of lung cancer s/p chemotherapy, COPD, HTN, HLD, and CKD presented due to worsening shortness of breath and admitted with acute on chronic hypoxic respiratory failure secondary to COPD and pneumonia.  Patient sitting up in bed this morning eating a cookie and reports feeling a little better today. She reports eating most of breakfast tray, recalls sausage biscuit, eggs, milk, and Ensure. Pt endorses decreased appetite over the past few weeks, states she has lost interest in eating and nothing sounds good to her, and  recently started on appetite stimulant by PCP. Pt reports she is unable to prepare meals for herself due to back pain,  recalls 1 Boost Plus daily and eating small bites of meals brought to her by friends. RD discussed strategies for enhancing the flavor of food, encouraged trying to eat something small every few hours verses large meals and increasing supplement intake. Pt does not particularly like the taste of supplements, unsure if she would tolerate drinking more than 1/day. RD suggested trying CIB, pt agreeable and prefers chocolate flavor. Will order on breakfast and dinner tray as well as Magic  Cup with lunch.   Pt reports 180 lb prior to cancer diagnosis, more recently she recalls weighing around 120 lbs. Currently pt weighs 132.66 lbs, per chart weights have trended down ~7 lbs (5%) in the last month; significant  Medications reviewed and include: D3, Gabapentin, Ensure Max, Mucinex  Labs: BUN 29 (H), WBC 10.8 (H), Hgb 9.5 (L), HCT 31.2 (L) K/Mg/P - WNL  NUTRITION - FOCUSED PHYSICAL EXAM:    Most Recent Value  Orbital Region Moderate depletion  Upper Arm Region Mild depletion  Thoracic and Lumbar Region No depletion  Buccal Region Mild depletion  Temple Region Moderate depletion  Clavicle Bone Region Mild depletion  Clavicle and Acromion Bone Region Mild depletion  Scapular Bone Region Unable to assess  Dorsal Hand No depletion  Patellar Region Moderate depletion  Anterior Thigh Region Mild depletion  Posterior Calf Region Moderate depletion  Edema (RD Assessment) None  Hair Reviewed  Eyes Reviewed  Mouth Reviewed  Skin Reviewed  Nails Reviewed       Diet Order:   Diet Order            Diet regular Room service appropriate? Yes; Fluid consistency: Thin  Diet effective now                 EDUCATION NEEDS:   Education needs have been addressed  Skin:  Skin Assessment: Reviewed RN Assessment  Last BM:  9/28  Height:   Ht Readings from Last 1 Encounters:  07/23/20 5\' 4"  (1.626 m)    Weight:  Wt Readings from Last 1 Encounters:  07/23/20 60.3 kg    Ideal Body Weight:  54.5 kg  BMI:  Body mass index is 22.83 kg/m.  Estimated Nutritional Needs:   Kcal:  1600-1800  Protein:  70-80  Fluid:  > 1.5 L/day    Lajuan Lines, RD, LDN Clinical Nutrition After Hours/Weekend Pager # in [place]

## 2020-07-25 NOTE — Care Management Important Message (Signed)
Important Message  Patient Details  Name: Brenda Parker MRN: [id number] Date of Birth: Apr 22, 1936   Medicare Important Message Given:  Yes  Initial Medicare IM given by Patient Access Associate on 07/25/2020 at 11:38am.   Dannette Barbara 07/25/2020, 1:48 PM

## 2020-07-25 NOTE — Progress Notes (Signed)
Brenda Parker   DOB:1936-07-05   ZJ#:[id number]    Subjective: No acute events overnight.  Appetite is fair.  Continues to complain of cough.  No chest pain.  Not yet ambulating.  Complains of generalized weakness.  Objective:  Vitals:   07/25/20 1358 07/25/20 1629  BP: (!) 106/45 (!) 113/56  Pulse: (!) 56 97  Resp: 16 16  Temp: 97.8 F (36.6 C) 98.1 F (36.7 C)  SpO2: 99% 96%     Intake/Output Summary (Last 24 hours) at 07/25/2020 1856 Last data filed at 07/25/2020 1013 Gross per 24 hour  Intake 125.56 ml  Output 250 ml  Net -124.44 ml    Physical Exam Vitals and nursing note reviewed.  Constitutional:      Comments: Frail-appearing Caucasian female patient.  Resting in the bed.  Comfortably.  HENT:     Head: Normocephalic and atraumatic.     Mouth/Throat:     Pharynx: No oropharyngeal exudate.  Eyes:     Pupils: Pupils are equal, round, and reactive to light.  Cardiovascular:     Rate and Rhythm: Normal rate and regular rhythm.  Pulmonary:     Effort: No respiratory distress.     Breath sounds: No wheezing.     Comments: Bilateral crackles.  Rhonchi. Abdominal:     General: Bowel sounds are normal. There is no distension.     Palpations: Abdomen is soft. There is no mass.     Tenderness: There is no abdominal tenderness. There is no guarding or rebound.  Musculoskeletal:        General: No tenderness. Normal range of motion.     Cervical back: Normal range of motion and neck supple.  Skin:    General: Skin is warm.  Neurological:     Mental Status: She is alert and oriented to person, place, and time.  Psychiatric:        Mood and Affect: Affect normal.      Labs:  Lab Results  Component Value Date   WBC 10.8 (H) 07/25/2020   HGB 9.5 (L) 07/25/2020   HCT 31.2 (L) 07/25/2020   MCV 90.7 07/25/2020   PLT 372 07/25/2020   NEUTROABS 12.2 (H) 07/23/2020    Lab Results  Component Value Date   NA 140 07/25/2020   K 3.8 07/25/2020   CL 106 07/25/2020   CO2  24 07/25/2020    Studies:  No results found.  Primary cancer of right upper lobe of lung [facility]) #84 year old female patient with metastatic/stage IV lung cancer currently on ""chemo holiday" is currently admitted hospital for worsening shortness of breath/cough; CT scan suggestive of pneumonia.  # Acute on chronic respiratory failure secondary to bilateral pneumonia-failed outpatient-failed outpatient management; currently on broad-spectrum antibiotics-cefepime.  Clinically seems to be improving.  # Metastatic lung cancer/stage IV-currently on chemo holiday.  September, 29th 2021-shows mildly progressive malignancy; however patient's symptoms are likely secondary to pneumonia.    #DNR/DNI  #We will update patient's son of progress.    [name], MD 07/25/2020  6:56 PM

## 2020-07-25 NOTE — Telephone Encounter (Signed)
On 9/30-spoke to son Chris/updated of clinical progress.  Currently patient is clinically stable/improving.

## 2020-07-26 ENCOUNTER — Telehealth: Payer: Self-pay | Admitting: Internal Medicine

## 2020-07-26 LAB — BASIC METABOLIC PANEL
Anion gap: 9 (ref 5–15)
BUN: 25 mg/dL — ABNORMAL HIGH (ref 8–23)
CO2: 25 mmol/L (ref 22–32)
Calcium: 8.8 mg/dL — ABNORMAL LOW (ref 8.9–10.3)
Chloride: 104 mmol/L (ref 98–111)
Creatinine, Ser: 0.82 mg/dL (ref 0.44–1.00)
GFR calc Af Amer: >60 mL/min (ref >60)
GFR calc non Af Amer: >60 mL/min (ref >60)
Glucose, Bld: 104 mg/dL — ABNORMAL HIGH (ref 70–99)
Potassium: 3.4 mmol/L — ABNORMAL LOW (ref 3.5–5.1)
Sodium: 138 mmol/L (ref 135–145)

## 2020-07-26 LAB — CBC
HCT: 28.3 % — ABNORMAL LOW (ref 36.0–46.0)
Hemoglobin: 9 g/dL — ABNORMAL LOW (ref 12.0–15.0)
MCH: 27.8 pg (ref 26.0–34.0)
MCHC: 31.8 g/dL (ref 30.0–36.0)
MCV: 87.3 fL (ref 80.0–100.0)
Platelets: 363 10*3/uL (ref 150–400)
RBC: 3.24 MIL/uL — ABNORMAL LOW (ref 3.87–5.11)
RDW: 16.7 % — ABNORMAL HIGH (ref 11.5–15.5)
WBC: 12 10*3/uL — ABNORMAL HIGH (ref 4.0–10.5)
nRBC: 0.3 % — ABNORMAL HIGH (ref 0.0–0.2)

## 2020-07-26 LAB — MAGNESIUM: Magnesium: 1.8 mg/dL (ref 1.7–2.4)

## 2020-07-26 LAB — PROCALCITONIN: Procalcitonin: 0.96 ng/mL

## 2020-07-26 MED ORDER — MAGNESIUM SULFATE IN D5W 1-5 GM/100ML-% IV SOLN
1.0000 g | Freq: Once | INTRAVENOUS | Status: AC
  Administered 2020-07-26: 1 g via INTRAVENOUS
  Filled 2020-07-26: qty 100

## 2020-07-26 MED ORDER — IPRATROPIUM-ALBUTEROL 0.5-2.5 (3) MG/3ML IN SOLN
3.0000 mL | Freq: Four times a day (QID) | RESPIRATORY_TRACT | Status: DC
  Administered 2020-07-26 – 2020-07-27 (×2): 3 mL via RESPIRATORY_TRACT
  Filled 2020-07-26 (×4): qty 3

## 2020-07-26 MED ORDER — POTASSIUM CHLORIDE CRYS ER 20 MEQ PO TBCR
40.0000 meq | EXTENDED_RELEASE_TABLET | Freq: Once | ORAL | Status: AC
  Administered 2020-07-26: 40 meq via ORAL
  Filled 2020-07-26: qty 2

## 2020-07-26 MED ORDER — MAGNESIUM SULFATE 50 % IJ SOLN
1.0000 g | Freq: Once | INTRAMUSCULAR | Status: DC
  Filled 2020-07-26: qty 2

## 2020-07-26 NOTE — Progress Notes (Signed)
PROGRESS NOTE    Brenda Parker  KVQ:[id number] DOB: 1936/06/13 DOA: 07/23/2020 PCP: Leonel Ramsay, MD    Assessment & Plan:   Principal Problem:   Pneumonia Active Problems:   Hypertension   Primary cancer of right upper lobe of lung ([place])   Multifocal pneumonia   Malnutrition of moderate degree   Acute on chronic respiratory failure with hypoxia ([place])    Brenda Parker is a 84 y.o. female with Past medical history of lung cancer status post chemotherapy, COPD, HTN, HLD, CKD presented at [facility] ED due to worsening of shortness of breath. Patient was treated as an outpatient with antibiotics for 2 days but her shortness of breath got worse and she was hypoxic. Patient has oxygen at home but she was not using it. Patient denied any chest pain or palpitations, denied any abdominal pain, no nausea vomiting or diarrhea.  # Acute on chronic hyper respiratory failure 2/2 COPD and pneumonia Patient got antibiotic treatment for 2 days but did not improve --hypoxic on presentation, placed on 4L O2 and started on vanc/cefe.  Vanc then d/c'ed after MRSA screen neg. PLAN: --Continue supplemental oxygen and gradually wean off --continue cefe for a 5-day course --Continue breathing treatment Mucinex twice daily, and chest PT for mucus clearance.    # Hypokalemia --Monitor ane replete PRN  # Lung cancer metastatic stage 4, status post chemotherapy. --chemo on hold due to intolerance --CT chest showed worsening malignancy PLAN: --follow oncology rec  # weight loss, decreased appetite, inadequate oral intake --due to cancer, chemo --supplements per nutrition consult  # Chronic back pain --cont home oxycodone PRN --cont home gabapentin   DVT prophylaxis: Lovenox SQ Code Status: DNR  Family Communication:  Status is: inpatient Dispo:   The patient is from: home Anticipated d/c is to: home Anticipated d/c date is: 2-3 days Patient currently is not medically stable to d/c due  to: significant hypoxia, worsening lung cancer, PNA, currently on IV abx   Subjective and Interval History:  Today pt said she wished she could just die, but when asked if she would like to change her goals of care and move towards comfort care and hospice, pt was not sure that's what she wanted.  I asked pt to discuss her prognosis with her oncologist.  Pt complained of having difficulty following the instruction for using flutter valve.     Objective: Vitals:   07/25/20 2020 07/26/20 0040 07/26/20 0731 07/26/20 0754  BP:  138/75  (!) 159/81  Pulse:  97 93 (!) 107  Resp:  17 18 20   Temp:  (!) 97.5 F (36.4 C)  97.6 F (36.4 C)  TempSrc:  Oral  Oral  SpO2: 97% 94% 92% 92%  Weight:      Height:        Intake/Output Summary (Last 24 hours) at 07/26/2020 1044 Last data filed at 07/26/2020 0100 Gross per 24 hour  Intake 311.96 ml  Output --  Net 311.96 ml   Filed Weights   07/23/20 1750  Weight: 60.3 kg    Examination:   Constitutional: NAD, AAOx3 HEENT: conjunctivae and lids normal, EOMI CV: RRR no M,R,G. Distal pulses +2.  No cyanosis.   RESP: diffuse rhonchi, loud gurgling sounds GI: +BS, NTND Extremities: No effusions, edema in BLE SKIN: warm, dry and intact Neuro: II - XII grossly intact.  Sensation intact Psych: depressed mood and affect.     Data Reviewed: I have personally reviewed following labs and imaging  studies  CBC: Recent Labs  Lab 07/21/20 2018 07/23/20 1134 07/24/20 0357 07/25/20 0423 07/26/20 0451  WBC 12.0* 13.3* 9.3 10.8* 12.0*  NEUTROABS  --  12.2*  --   --   --   HGB 9.7* 9.1* 8.5* 9.5* 9.0*  HCT 30.0* 27.9* 26.1* 31.2* 28.3*  MCV 87.7 86.1 86.4 90.7 87.3  PLT 376 446* 311 372 696   Basic Metabolic Panel: Recent Labs  Lab 07/21/20 2018 07/23/20 1134 07/24/20 0357 07/25/20 0423 07/26/20 0451  NA 134* 138 139 140 138  K 3.3* 3.2* 4.1 3.8 3.4*  CL 103 106 108 106 104  CO2 18* 20* 24 24 25   GLUCOSE 133* 143* 139* 110* 104*  BUN  27* 31* 30* 29* 25*  CREATININE 1.07* 0.79 0.91 0.85 0.82  CALCIUM 8.9 8.8* 8.9 9.0 8.8*  MG  --   --  1.9 2.1 1.8  PHOS  --   --  3.1  --   --    GFR: Estimated Creatinine Clearance: 45.7 mL/min (by C-G formula based on SCr of 0.82 mg/dL). Liver Function Tests: Recent Labs  Lab 07/21/20 2018 07/23/20 1805  AST 14* 56*  ALT 12 33  ALKPHOS 82 96  BILITOT 0.9 0.6  PROT 8.0 8.0  ALBUMIN 2.8* 2.6*   No results for input(s): LIPASE, AMYLASE in the last 168 hours. No results for input(s): AMMONIA in the last 168 hours. Coagulation Profile: Recent Labs  Lab 07/23/20 1605  INR 1.0   Cardiac Enzymes: No results for input(s): CKTOTAL, CKMB, CKMBINDEX, TROPONINI in the last 168 hours. BNP (last 3 results) No results for input(s): PROBNP in the last 8760 hours. HbA1C: No results for input(s): HGBA1C in the last 72 hours. CBG: No results for input(s): GLUCAP in the last 168 hours. Lipid Profile: No results for input(s): CHOL, HDL, LDLCALC, TRIG, CHOLHDL, LDLDIRECT in the last 72 hours. Thyroid Function Tests: No results for input(s): TSH, T4TOTAL, FREET4, T3FREE, THYROIDAB in the last 72 hours. Anemia Panel: No results for input(s): VITAMINB12, FOLATE, FERRITIN, TIBC, IRON, RETICCTPCT in the last 72 hours. Sepsis Labs: Recent Labs  Lab 07/23/20 1805 07/23/20 2002 07/24/20 0357 07/26/20 0451  PROCALCITON  --   --  1.93 0.96  LATICACIDVEN 1.6 1.3  --   --     Recent Results (from the past 240 hour(s))  Respiratory Panel by RT PCR (Flu A&B, Covid) - Nasopharyngeal Swab     Status: None   Collection Time: 07/21/20  8:25 PM   Specimen: Nasopharyngeal Swab  Result Value Ref Range Status   SARS Coronavirus 2 by RT PCR NEGATIVE NEGATIVE Final    Comment: (NOTE) SARS-CoV-2 target nucleic acids are NOT DETECTED.  The SARS-CoV-2 RNA is generally detectable in upper respiratoy specimens during the acute phase of infection. The lowest concentration of SARS-CoV-2 viral copies  this assay can detect is 131 copies/mL. A negative result does not preclude SARS-Cov-2 infection and should not be used as the sole basis for treatment or other patient management decisions. A negative result may occur with  improper specimen collection/handling, submission of specimen other than nasopharyngeal swab, presence of viral mutation(s) within the areas targeted by this assay, and inadequate number of viral copies (<131 copies/mL). A negative result must be combined with clinical observations, patient history, and epidemiological information. The expected result is Negative.  Fact Sheet for Patients:  PinkCheek.be  Fact Sheet for Healthcare Providers:  GravelBags.it  This test is no t yet approved or cleared by  the [employer] and  has been authorized for detection and/or diagnosis of SARS-CoV-2 by FDA under an Emergency Use Authorization (EUA). This EUA will remain  in effect (meaning this test can be used) for the duration of the COVID-19 declaration under Section 564(b)(1) of the Act, 21 U.S.C. section 360bbb-3(b)(1), unless the authorization is terminated or revoked sooner.     Influenza A by PCR NEGATIVE NEGATIVE Final   Influenza B by PCR NEGATIVE NEGATIVE Final    Comment: (NOTE) The Xpert Xpress SARS-CoV-2/FLU/RSV assay is intended as an aid in  the diagnosis of influenza from Nasopharyngeal swab specimens and  should not be used as a sole basis for treatment. Nasal washings and  aspirates are unacceptable for Xpert Xpress SARS-CoV-2/FLU/RSV  testing.  Fact Sheet for Patients: PinkCheek.be  Fact Sheet for Healthcare Providers: GravelBags.it  This test is not yet approved or cleared by the [place] FDA and  has been authorized for detection and/or diagnosis of SARS-CoV-2 by  FDA under an Emergency Use Authorization (EUA). This EUA  will remain  in effect (meaning this test can be used) for the duration of the  Covid-19 declaration under Section 564(b)(1) of the Act, 21  U.S.C. section 360bbb-3(b)(1), unless the authorization is  terminated or revoked. Performed at [facility], [address] [place], [place] [address]   Gastrointestinal Panel by PCR , Stool     Status: None   Collection Time: 07/22/20 12:20 PM   Specimen: Stool  Result Value Ref Range Status   Campylobacter species NOT DETECTED NOT DETECTED Final   Plesimonas shigelloides NOT DETECTED NOT DETECTED Final   Salmonella species NOT DETECTED NOT DETECTED Final   Yersinia enterocolitica NOT DETECTED NOT DETECTED Final   Vibrio species NOT DETECTED NOT DETECTED Final   Vibrio cholerae NOT DETECTED NOT DETECTED Final   Enteroaggregative E coli (EAEC) NOT DETECTED NOT DETECTED Final   Enteropathogenic E coli (EPEC) NOT DETECTED NOT DETECTED Final   Enterotoxigenic E coli (ETEC) NOT DETECTED NOT DETECTED Final   Shiga like toxin producing E coli (STEC) NOT DETECTED NOT DETECTED Final   Shigella/Enteroinvasive E coli (EIEC) NOT DETECTED NOT DETECTED Final   Cryptosporidium NOT DETECTED NOT DETECTED Final   Cyclospora cayetanensis NOT DETECTED NOT DETECTED Final   Entamoeba histolytica NOT DETECTED NOT DETECTED Final   Giardia lamblia NOT DETECTED NOT DETECTED Final   Adenovirus F40/41 NOT DETECTED NOT DETECTED Final   Astrovirus NOT DETECTED NOT DETECTED Final   Norovirus GI/GII NOT DETECTED NOT DETECTED Final   Rotavirus A NOT DETECTED NOT DETECTED Final   Sapovirus (I, II, IV, and V) NOT DETECTED NOT DETECTED Final    Comment: Performed at [facility], [address] [place], [place] [address]  C difficile quick screen w PCR reflex     Status: None   Collection Time: 07/22/20 12:20 PM   Specimen: STOOL  Result Value Ref Range Status   C Diff antigen NEGATIVE NEGATIVE Final   C Diff toxin NEGATIVE NEGATIVE Final   C Diff  interpretation No C. difficile detected.  Final    Comment: Performed at [facility], [address] [place], [place] [address]  Culture, blood (Routine x 2)     Status: None (Preliminary result)   Collection Time: 07/23/20  6:05 PM   Specimen: BLOOD  Result Value Ref Range Status   Specimen Description BLOOD BLOOD LEFT ARM  Final   Special Requests   Final  BOTTLES DRAWN AEROBIC AND ANAEROBIC Blood Culture adequate volume   Culture   Final    NO GROWTH 3 DAYS Performed at [facility], [address] [place], [place] [address]    Report Status PENDING  Incomplete  Culture, blood (Routine x 2)     Status: None (Preliminary result)   Collection Time: 07/23/20  6:06 PM   Specimen: BLOOD  Result Value Ref Range Status   Specimen Description BLOOD BLOOD RIGHT ARM  Final   Special Requests   Final    BOTTLES DRAWN AEROBIC AND ANAEROBIC Blood Culture adequate volume   Culture   Final    NO GROWTH 3 DAYS Performed at [facility], [address], [place], [place] [address]    Report Status PENDING  Incomplete  Respiratory Panel by RT PCR (Flu A&B, Covid) - Nasopharyngeal Swab     Status: None   Collection Time: 07/23/20  8:02 PM   Specimen: Nasopharyngeal Swab  Result Value Ref Range Status   SARS Coronavirus 2 by RT PCR NEGATIVE NEGATIVE Final    Comment: (NOTE) SARS-CoV-2 target nucleic acids are NOT DETECTED.  The SARS-CoV-2 RNA is generally detectable in upper respiratoy specimens during the acute phase of infection. The lowest concentration of SARS-CoV-2 viral copies this assay can detect is 131 copies/mL. A negative result does not preclude SARS-Cov-2 infection and should not be used as the sole basis for treatment or other patient management decisions. A negative result may occur with  improper specimen collection/handling, submission of specimen other than nasopharyngeal swab, presence of viral mutation(s) within the areas targeted by this  assay, and inadequate number of viral copies (<131 copies/mL). A negative result must be combined with clinical observations, patient history, and epidemiological information. The expected result is Negative.  Fact Sheet for Patients:  PinkCheek.be  Fact Sheet for Healthcare Providers:  GravelBags.it  This test is no t yet approved or cleared by the [place] FDA and  has been authorized for detection and/or diagnosis of SARS-CoV-2 by FDA under an Emergency Use Authorization (EUA). This EUA will remain  in effect (meaning this test can be used) for the duration of the COVID-19 declaration under Section 564(b)(1) of the Act, 21 U.S.C. section 360bbb-3(b)(1), unless the authorization is terminated or revoked sooner.     Influenza A by PCR NEGATIVE NEGATIVE Final   Influenza B by PCR NEGATIVE NEGATIVE Final    Comment: (NOTE) The Xpert Xpress SARS-CoV-2/FLU/RSV assay is intended as an aid in  the diagnosis of influenza from Nasopharyngeal swab specimens and  should not be used as a sole basis for treatment. Nasal washings and  aspirates are unacceptable for Xpert Xpress SARS-CoV-2/FLU/RSV  testing.  Fact Sheet for Patients: PinkCheek.be  Fact Sheet for Healthcare Providers: GravelBags.it  This test is not yet approved or cleared by the [place] FDA and  has been authorized for detection and/or diagnosis of SARS-CoV-2 by  FDA under an Emergency Use Authorization (EUA). This EUA will remain  in effect (meaning this test can be used) for the duration of the  Covid-19 declaration under Section 564(b)(1) of the Act, 21  U.S.C. section 360bbb-3(b)(1), unless the authorization is  terminated or revoked. Performed at [facility], [address] [place], [place] [address]   MRSA PCR Screening     Status: None   Collection Time: 07/24/20  4:05 PM    Specimen: Nasal Mucosa; Nasopharyngeal  Result Value Ref Range Status   MRSA by PCR  NEGATIVE NEGATIVE Final    Comment:        The GeneXpert MRSA Assay (FDA approved for NASAL specimens only), is one component of a comprehensive MRSA colonization surveillance program. It is not intended to diagnose MRSA infection nor to guide or monitor treatment for MRSA infections. Performed at [facility], [address], [place], [place] [address]       Radiology Studies: No results found.   Scheduled Meds: . Chlorhexidine Gluconate Cloth  6 each Topical Daily  . cholecalciferol  2,000 Units Oral Daily  . enoxaparin (LOVENOX) injection  40 mg Subcutaneous Q24H  . umeclidinium bromide  1 puff Inhalation Daily   And  . fluticasone furoate-vilanterol  1 puff Inhalation Daily  . gabapentin  100 mg Oral TID  . guaiFENesin  600 mg Oral BID  . influenza vaccine adjuvanted  0.5 mL Intramuscular Tomorrow-1000  . ipratropium-albuterol  3 mL Nebulization QID  . latanoprost  1 drop Both Eyes QHS  . pneumococcal 23 valent vaccine  0.5 mL Intramuscular Tomorrow-1000  . Ensure Max Protein  11 oz Oral Daily  . sodium chloride flush  3 mL Intravenous Q12H   Continuous Infusions: . sodium chloride 250 mL (07/25/20 2058)  . ceFEPime (MAXIPIME) IV 2 g (07/25/20 2102)  . magnesium sulfate bolus IVPB 1 g (07/26/20 0955)     LOS: 3 days     Enzo Bi, MD [facility] If 7PM-7AM, please contact night-coverage 07/26/2020, 10:44 AM

## 2020-07-26 NOTE — Progress Notes (Signed)
PROGRESS NOTE    [name]  WUJ:[id number] DOB: 1935-11-16 DOA: 07/23/2020 PCP: Leonel Ramsay, MD    Assessment & Plan:   Principal Problem:   Pneumonia Active Problems:   Hypertension   Primary cancer of right upper lobe of lung ([place])   Multifocal pneumonia   Malnutrition of moderate degree   Acute on chronic respiratory failure with hypoxia ([place])    Brenda Parker is a 84 y.o. female with Past medical history of lung cancer status post chemotherapy, COPD, HTN, HLD, CKD presented at [facility] ED due to worsening of shortness of breath. Patient was treated as an outpatient with antibiotics for 2 days but her shortness of breath got worse and she was hypoxic. Patient has oxygen at home but she was not using it. Patient denied any chest pain or palpitations, denied any abdominal pain, no nausea vomiting or diarrhea.  # Acute on chronic hyper respiratory failure 2/2 COPD and pneumonia Patient got antibiotic treatment for 2 days but did not improve --hypoxic on presentation, placed on 4L O2 and started on vanc/cefe.  Vanc then d/c'ed after MRSA screen neg. PLAN: --Continue supplemental oxygen and gradually wean off --continue cefe for a 5-day course --continue scheduled DuoNeb and bronchodilator inhalers --scheduled chest PT with RT for mucus clearance   # Hypokalemia --Monitor ane replete PRN  # Lung cancer metastatic stage 4, status post chemotherapy. --chemo on hold due to intolerance --CT chest showed worsening malignancy PLAN: --follow oncology rec --Pt currently is not ready for comfort care or hospice  # weight loss, decreased appetite, inadequate oral intake --due to cancer, chemo --supplements per nutrition consult --Ensure  # Chronic back pain --cont home oxycodone PRN --cont home gabapentin  Glaucoma --cont home eye drops   DVT prophylaxis: Lovenox SQ Code Status: DNR  Family Communication:  Status is: inpatient Dispo:   The patient is from:  home Anticipated d/c is to: home vs rehab, pending PT eval Anticipated d/c date is: 2-3 days Patient currently is not medically stable to d/c due to: significant hypoxia, worsening lung cancer, PNA, currently on IV abx   Subjective and Interval History:  This morning again pt expressed to the nurse that she wished she could just die.  However, later when during rounds, pt said she would hang around a while.  RT performed mucus clearing chest PT with pt, and pt felt better and sounded better afterwards.   Objective: Vitals:   07/26/20 0731 07/26/20 0754 07/26/20 1215 07/26/20 1507  BP:  (!) 159/81  121/60  Pulse: 93 (!) 107 97 92  Resp: 18 20 16 17   Temp:  97.6 F (36.4 C)  98.2 F (36.8 C)  TempSrc:  Oral  Oral  SpO2: 92% 92% 94% 95%  Weight:      Height:        Intake/Output Summary (Last 24 hours) at 07/26/2020 1747 Last data filed at 07/26/2020 1422 Gross per 24 hour  Intake 551.96 ml  Output --  Net 551.96 ml   Filed Weights   07/23/20 1750  Weight: 60.3 kg    Examination:   Constitutional: NAD, AAOx3 HEENT: conjunctivae and lids normal, EOMI CV: RRR no M,R,G. Distal pulses +2.  No cyanosis.   RESP: breath sounds less rhonchus, no more gurgling sounds, on 2L GI: +BS, NTND Extremities: No effusions, edema in BLE SKIN: warm, dry and intact Neuro: II - XII grossly intact.  Sensation intact Psych: depressed mood and affect.  Data Reviewed: I have personally reviewed following labs and imaging studies  CBC: Recent Labs  Lab 07/21/20 2018 07/23/20 1134 07/24/20 0357 07/25/20 0423 07/26/20 0451  WBC 12.0* 13.3* 9.3 10.8* 12.0*  NEUTROABS  --  12.2*  --   --   --   HGB 9.7* 9.1* 8.5* 9.5* 9.0*  HCT 30.0* 27.9* 26.1* 31.2* 28.3*  MCV 87.7 86.1 86.4 90.7 87.3  PLT 376 446* 311 372 532   Basic Metabolic Panel: Recent Labs  Lab 07/21/20 2018 07/23/20 1134 07/24/20 0357 07/25/20 0423 07/26/20 0451  NA 134* 138 139 140 138  K 3.3* 3.2* 4.1 3.8 3.4*   CL 103 106 108 106 104  CO2 18* 20* 24 24 25   GLUCOSE 133* 143* 139* 110* 104*  BUN 27* 31* 30* 29* 25*  CREATININE 1.07* 0.79 0.91 0.85 0.82  CALCIUM 8.9 8.8* 8.9 9.0 8.8*  MG  --   --  1.9 2.1 1.8  PHOS  --   --  3.1  --   --    GFR: Estimated Creatinine Clearance: 45.7 mL/min (by C-G formula based on SCr of 0.82 mg/dL). Liver Function Tests: Recent Labs  Lab 07/21/20 2018 07/23/20 1805  AST 14* 56*  ALT 12 33  ALKPHOS 82 96  BILITOT 0.9 0.6  PROT 8.0 8.0  ALBUMIN 2.8* 2.6*   No results for input(s): LIPASE, AMYLASE in the last 168 hours. No results for input(s): AMMONIA in the last 168 hours. Coagulation Profile: Recent Labs  Lab 07/23/20 1605  INR 1.0   Cardiac Enzymes: No results for input(s): CKTOTAL, CKMB, CKMBINDEX, TROPONINI in the last 168 hours. BNP (last 3 results) No results for input(s): PROBNP in the last 8760 hours. HbA1C: No results for input(s): HGBA1C in the last 72 hours. CBG: No results for input(s): GLUCAP in the last 168 hours. Lipid Profile: No results for input(s): CHOL, HDL, LDLCALC, TRIG, CHOLHDL, LDLDIRECT in the last 72 hours. Thyroid Function Tests: No results for input(s): TSH, T4TOTAL, FREET4, T3FREE, THYROIDAB in the last 72 hours. Anemia Panel: No results for input(s): VITAMINB12, FOLATE, FERRITIN, TIBC, IRON, RETICCTPCT in the last 72 hours. Sepsis Labs: Recent Labs  Lab 07/23/20 1805 07/23/20 2002 07/24/20 0357 07/26/20 0451  PROCALCITON  --   --  1.93 0.96  LATICACIDVEN 1.6 1.3  --   --     Recent Results (from the past 240 hour(s))  Respiratory Panel by RT PCR (Flu A&B, Covid) - Nasopharyngeal Swab     Status: None   Collection Time: 07/21/20  8:25 PM   Specimen: Nasopharyngeal Swab  Result Value Ref Range Status   SARS Coronavirus 2 by RT PCR NEGATIVE NEGATIVE Final    Comment: (NOTE) SARS-CoV-2 target nucleic acids are NOT DETECTED.  The SARS-CoV-2 RNA is generally detectable in upper respiratoy specimens  during the acute phase of infection. The lowest concentration of SARS-CoV-2 viral copies this assay can detect is 131 copies/mL. A negative result does not preclude SARS-Cov-2 infection and should not be used as the sole basis for treatment or other patient management decisions. A negative result may occur with  improper specimen collection/handling, submission of specimen other than nasopharyngeal swab, presence of viral mutation(s) within the areas targeted by this assay, and inadequate number of viral copies (<131 copies/mL). A negative result must be combined with clinical observations, patient history, and epidemiological information. The expected result is Negative.  Fact Sheet for Patients:  PinkCheek.be  Fact Sheet for Healthcare Providers:  GravelBags.it  This test is no t yet approved or cleared by the [place] and  has been authorized for detection and/or diagnosis of SARS-CoV-2 by FDA under an Emergency Use Authorization (EUA). This EUA will remain  in effect (meaning this test can be used) for the duration of the COVID-19 declaration under Section 564(b)(1) of the Act, 21 U.S.C. section 360bbb-3(b)(1), unless the authorization is terminated or revoked sooner.     Influenza A by PCR NEGATIVE NEGATIVE Final   Influenza B by PCR NEGATIVE NEGATIVE Final    Comment: (NOTE) The Xpert Xpress SARS-CoV-2/FLU/RSV assay is intended as an aid in  the diagnosis of influenza from Nasopharyngeal swab specimens and  should not be used as a sole basis for treatment. Nasal washings and  aspirates are unacceptable for Xpert Xpress SARS-CoV-2/FLU/RSV  testing.  Fact Sheet for Patients: PinkCheek.be  Fact Sheet for Healthcare Providers: GravelBags.it  This test is not yet approved or cleared by the [place] FDA and  has been authorized for detection and/or  diagnosis of SARS-CoV-2 by  FDA under an Emergency Use Authorization (EUA). This EUA will remain  in effect (meaning this test can be used) for the duration of the  Covid-19 declaration under Section 564(b)(1) of the Act, 21  U.S.C. section 360bbb-3(b)(1), unless the authorization is  terminated or revoked. Performed at [facility], [address] [place], [place] [address]   Gastrointestinal Panel by PCR , Stool     Status: None   Collection Time: 07/22/20 12:20 PM   Specimen: Stool  Result Value Ref Range Status   Campylobacter species NOT DETECTED NOT DETECTED Final   Plesimonas shigelloides NOT DETECTED NOT DETECTED Final   Salmonella species NOT DETECTED NOT DETECTED Final   Yersinia enterocolitica NOT DETECTED NOT DETECTED Final   Vibrio species NOT DETECTED NOT DETECTED Final   Vibrio cholerae NOT DETECTED NOT DETECTED Final   Enteroaggregative E coli (EAEC) NOT DETECTED NOT DETECTED Final   Enteropathogenic E coli (EPEC) NOT DETECTED NOT DETECTED Final   Enterotoxigenic E coli (ETEC) NOT DETECTED NOT DETECTED Final   Shiga like toxin producing E coli (STEC) NOT DETECTED NOT DETECTED Final   Shigella/Enteroinvasive E coli (EIEC) NOT DETECTED NOT DETECTED Final   Cryptosporidium NOT DETECTED NOT DETECTED Final   Cyclospora cayetanensis NOT DETECTED NOT DETECTED Final   Entamoeba histolytica NOT DETECTED NOT DETECTED Final   Giardia lamblia NOT DETECTED NOT DETECTED Final   Adenovirus F40/41 NOT DETECTED NOT DETECTED Final   Astrovirus NOT DETECTED NOT DETECTED Final   Norovirus GI/GII NOT DETECTED NOT DETECTED Final   Rotavirus A NOT DETECTED NOT DETECTED Final   Sapovirus (I, II, IV, and V) NOT DETECTED NOT DETECTED Final    Comment: Performed at [facility], [address] [place], [place] [address]  C difficile quick screen w PCR reflex     Status: None   Collection Time: 07/22/20 12:20 PM   Specimen: STOOL  Result Value Ref Range Status   C Diff  antigen NEGATIVE NEGATIVE Final   C Diff toxin NEGATIVE NEGATIVE Final   C Diff interpretation No C. difficile detected.  Final    Comment: Performed at [facility], [address] [place], [place] [address]  Culture, blood (Routine x 2)     Status: None (Preliminary result)   Collection Time: 07/23/20  6:05 PM   Specimen: BLOOD  Result Value Ref Range Status   Specimen Description BLOOD BLOOD LEFT ARM  Final  Special Requests   Final    BOTTLES DRAWN AEROBIC AND ANAEROBIC Blood Culture adequate volume   Culture   Final    NO GROWTH 3 DAYS Performed at [facility], [address] [place], [place] [address]    Report Status PENDING  Incomplete  Culture, blood (Routine x 2)     Status: None (Preliminary result)   Collection Time: 07/23/20  6:06 PM   Specimen: BLOOD  Result Value Ref Range Status   Specimen Description BLOOD BLOOD RIGHT ARM  Final   Special Requests   Final    BOTTLES DRAWN AEROBIC AND ANAEROBIC Blood Culture adequate volume   Culture   Final    NO GROWTH 3 DAYS Performed at [facility], [address] [place], [place] [address]    Report Status PENDING  Incomplete  Respiratory Panel by RT PCR (Flu A&B, Covid) - Nasopharyngeal Swab     Status: None   Collection Time: 07/23/20  8:02 PM   Specimen: Nasopharyngeal Swab  Result Value Ref Range Status   SARS Coronavirus 2 by RT PCR NEGATIVE NEGATIVE Final    Comment: (NOTE) SARS-CoV-2 target nucleic acids are NOT DETECTED.  The SARS-CoV-2 RNA is generally detectable in upper respiratoy specimens during the acute phase of infection. The lowest concentration of SARS-CoV-2 viral copies this assay can detect is 131 copies/mL. A negative result does not preclude SARS-Cov-2 infection and should not be used as the sole basis for treatment or other patient management decisions. A negative result may occur with  improper specimen collection/handling, submission of specimen other than  nasopharyngeal swab, presence of viral mutation(s) within the areas targeted by this assay, and inadequate number of viral copies (<131 copies/mL). A negative result must be combined with clinical observations, patient history, and epidemiological information. The expected result is Negative.  Fact Sheet for Patients:  PinkCheek.be  Fact Sheet for Healthcare Providers:  GravelBags.it  This test is no t yet approved or cleared by the [place] FDA and  has been authorized for detection and/or diagnosis of SARS-CoV-2 by FDA under an Emergency Use Authorization (EUA). This EUA will remain  in effect (meaning this test can be used) for the duration of the COVID-19 declaration under Section 564(b)(1) of the Act, 21 U.S.C. section 360bbb-3(b)(1), unless the authorization is terminated or revoked sooner.     Influenza A by PCR NEGATIVE NEGATIVE Final   Influenza B by PCR NEGATIVE NEGATIVE Final    Comment: (NOTE) The Xpert Xpress SARS-CoV-2/FLU/RSV assay is intended as an aid in  the diagnosis of influenza from Nasopharyngeal swab specimens and  should not be used as a sole basis for treatment. Nasal washings and  aspirates are unacceptable for Xpert Xpress SARS-CoV-2/FLU/RSV  testing.  Fact Sheet for Patients: PinkCheek.be  Fact Sheet for Healthcare Providers: GravelBags.it  This test is not yet approved or cleared by the [place] FDA and  has been authorized for detection and/or diagnosis of SARS-CoV-2 by  FDA under an Emergency Use Authorization (EUA). This EUA will remain  in effect (meaning this test can be used) for the duration of the  Covid-19 declaration under Section 564(b)(1) of the Act, 21  U.S.C. section 360bbb-3(b)(1), unless the authorization is  terminated or revoked. Performed at [facility], [address] [place], [place]  [address]   MRSA PCR Screening     Status: None   Collection Time: 07/24/20  4:05 PM   Specimen: Nasal Mucosa; Nasopharyngeal  Result Value  Ref Range Status   MRSA by PCR NEGATIVE NEGATIVE Final    Comment:        The GeneXpert MRSA Assay (FDA approved for NASAL specimens only), is one component of a comprehensive MRSA colonization surveillance program. It is not intended to diagnose MRSA infection nor to guide or monitor treatment for MRSA infections. Performed at [facility], [address], [place], [place] [address]       Radiology Studies: No results found.   Scheduled Meds: . Chlorhexidine Gluconate Cloth  6 each Topical Daily  . cholecalciferol  2,000 Units Oral Daily  . enoxaparin (LOVENOX) injection  40 mg Subcutaneous Q24H  . umeclidinium bromide  1 puff Inhalation Daily   And  . fluticasone furoate-vilanterol  1 puff Inhalation Daily  . gabapentin  100 mg Oral TID  . guaiFENesin  600 mg Oral BID  . influenza vaccine adjuvanted  0.5 mL Intramuscular Tomorrow-1000  . ipratropium-albuterol  3 mL Nebulization QID  . latanoprost  1 drop Both Eyes QHS  . pneumococcal 23 valent vaccine  0.5 mL Intramuscular Tomorrow-1000  . Ensure Max Protein  11 oz Oral Daily  . sodium chloride flush  3 mL Intravenous Q12H   Continuous Infusions: . sodium chloride 250 mL (07/25/20 2058)  . ceFEPime (MAXIPIME) IV 2 g (07/25/20 2102)     LOS: 3 days     Enzo Bi, MD [facility] If 7PM-7AM, please contact night-coverage 07/26/2020, 5:47 PM

## 2020-07-26 NOTE — Progress Notes (Signed)
Paged Dr Billie Ruddy, pt has verbally expressed "I only want to die."  Pt has refused many meds including IV abx.

## 2020-07-26 NOTE — Telephone Encounter (Signed)
On 10/01-spoke to patient's son updated regarding patient's progress.  Also informed slightly progressive malignancy.  We will continue to treat pneumonia for now.  Also discussed that overall prognosis continues to be poor/and unfortunately malignancy will continue to get worse/and patient is a high risk of repeated pneumonias given immunocompromise status.

## 2020-07-26 NOTE — Progress Notes (Signed)
Brenda Parker   DOB:October 04, 1936   PN#:[id number]    Subjective: No acute events overnight.  No fever no chills.  Continues to cough-however feels sputum is loosening up.  No nausea no vomiting.  Patient overall feels weak has no motivation to get up and sit in a chair.  She states that "she would not want to live like like this".   Objective:  Vitals:   07/26/20 2343 07/27/20 0310  BP: 131/70   Pulse: 92   Resp: 17   Temp: 98.5 F (36.9 C)   SpO2: 97% 97%     Intake/Output Summary (Last 24 hours) at 07/27/2020 0735 Last data filed at 07/26/2020 1903 Gross per 24 hour  Intake 480 ml  Output --  Net 480 ml    Physical Exam Constitutional:      Comments: Appears frail.  Alone.  HENT:     Head: Normocephalic and atraumatic.     Mouth/Throat:     Pharynx: No oropharyngeal exudate.  Eyes:     Pupils: Pupils are equal, round, and reactive to light.  Cardiovascular:     Rate and Rhythm: Normal rate and regular rhythm.  Pulmonary:     Effort: No respiratory distress.     Breath sounds: No wheezing.     Comments: Bilateral decreased breath sounds.  Crackles lower lung fields improved. Abdominal:     General: Bowel sounds are normal. There is no distension.     Palpations: Abdomen is soft. There is no mass.     Tenderness: There is no abdominal tenderness. There is no guarding or rebound.  Musculoskeletal:        General: No tenderness. Normal range of motion.     Cervical back: Normal range of motion and neck supple.  Skin:    General: Skin is warm.  Neurological:     Mental Status: She is alert and oriented to person, place, and time.  Psychiatric:        Mood and Affect: Affect normal.      Labs:  Lab Results  Component Value Date   WBC 13.2 (H) 07/27/2020   HGB 10.0 (L) 07/27/2020   HCT 31.1 (L) 07/27/2020   MCV 88.9 07/27/2020   PLT 375 07/27/2020   NEUTROABS 12.2 (H) 07/23/2020    Lab Results  Component Value Date   NA 138 07/26/2020   K 3.4 (L) 07/26/2020    CL 104 07/26/2020   CO2 25 07/26/2020    Studies:  No results found.  Primary cancer of right upper lobe of lung [facility]) #84 year old female patient with metastatic/stage IV lung cancer currently on ""chemo holiday" is currently admitted hospital for worsening shortness of breath/cough; CT scan suggestive of pneumonia.  # Acute on chronic respiratory failure secondary to bilateral pneumonia-failed outpatient-failed outpatient management; currently on broad-spectrum antibiotics-cefepime.  Clinically improving.  # Metastatic lung cancer/stage IV-currently on chemo holiday.  September, 29th 2021-shows mildly progressive malignancy; however patient's symptoms are likely secondary to pneumonia.    #Debility-multifactorial recommend spirometer/sitting in a chair/ambulation.  Recommend PT/OT  #DNR/DNI  #Prognosis: Discussed with patient son [name] regarding overall patient's poor prognosis.  However I would recommend continued treatment with antibiotics over the next few days-hopefully with recovery from the current episode of pneumonia.  However discussed at length that unfortunately event like this will recur [in the context of underlying malignancy; also COPD].  Also discussed with son that patient seems to lack motivation to ambulate or even sit in a chair-patient fortunately  could be detrimental to her care.  He states that he will talk to the patient.  [name], MD

## 2020-07-27 LAB — BASIC METABOLIC PANEL
Anion gap: 10 (ref 5–15)
BUN: 18 mg/dL (ref 8–23)
CO2: 25 mmol/L (ref 22–32)
Calcium: 8.8 mg/dL — ABNORMAL LOW (ref 8.9–10.3)
Chloride: 103 mmol/L (ref 98–111)
Creatinine, Ser: 0.85 mg/dL (ref 0.44–1.00)
GFR calc Af Amer: >60 mL/min (ref >60)
GFR calc non Af Amer: >60 mL/min (ref >60)
Glucose, Bld: 109 mg/dL — ABNORMAL HIGH (ref 70–99)
Potassium: 3.4 mmol/L — ABNORMAL LOW (ref 3.5–5.1)
Sodium: 138 mmol/L (ref 135–145)

## 2020-07-27 LAB — CBC
HCT: 31.1 % — ABNORMAL LOW (ref 36.0–46.0)
Hemoglobin: 10 g/dL — ABNORMAL LOW (ref 12.0–15.0)
MCH: 28.6 pg (ref 26.0–34.0)
MCHC: 32.2 g/dL (ref 30.0–36.0)
MCV: 88.9 fL (ref 80.0–100.0)
Platelets: 375 10*3/uL (ref 150–400)
RBC: 3.5 MIL/uL — ABNORMAL LOW (ref 3.87–5.11)
RDW: 17.3 % — ABNORMAL HIGH (ref 11.5–15.5)
WBC: 13.2 10*3/uL — ABNORMAL HIGH (ref 4.0–10.5)
nRBC: 0.3 % — ABNORMAL HIGH (ref 0.0–0.2)

## 2020-07-27 LAB — MAGNESIUM: Magnesium: 2.2 mg/dL (ref 1.7–2.4)

## 2020-07-27 MED ORDER — ENSURE MAX PROTEIN PO LIQD
11.0000 [oz_av] | Freq: Two times a day (BID) | ORAL | Status: DC
  Administered 2020-07-28 – 2020-07-30 (×5): 11 [oz_av] via ORAL
  Filled 2020-07-27: qty 330

## 2020-07-27 MED ORDER — IPRATROPIUM-ALBUTEROL 0.5-2.5 (3) MG/3ML IN SOLN
3.0000 mL | Freq: Four times a day (QID) | RESPIRATORY_TRACT | Status: DC
  Administered 2020-07-27 – 2020-07-29 (×7): 3 mL via RESPIRATORY_TRACT
  Filled 2020-07-27 (×7): qty 3

## 2020-07-27 MED ORDER — POTASSIUM CHLORIDE CRYS ER 20 MEQ PO TBCR
40.0000 meq | EXTENDED_RELEASE_TABLET | Freq: Once | ORAL | Status: AC
  Administered 2020-07-27: 40 meq via ORAL
  Filled 2020-07-27: qty 2

## 2020-07-27 NOTE — Progress Notes (Signed)
PROGRESS NOTE    Brenda Parker  MCN:[id number] DOB: Mar 19, 1936 DOA: 07/23/2020 PCP: Leonel Ramsay, MD    Assessment & Plan:   Principal Problem:   Pneumonia Active Problems:   Hypertension   Primary cancer of right upper lobe of lung ([place])   Multifocal pneumonia   Malnutrition of moderate degree   Acute on chronic respiratory failure with hypoxia ([place])    Brenda Parker is a 84 y.o. female with Past medical history of lung cancer status post chemotherapy, COPD, HTN, HLD, CKD presented [facility] due to worsening of shortness of breath. Patient was treated as an outpatient with antibiotics for 2 days but her shortness of breath got worse and she was hypoxic. Patient has oxygen at home but she was not using it. Patient denied any chest pain or palpitations, denied any abdominal pain, no nausea vomiting or diarrhea.  # Acute on chronic hyper respiratory failure 2/2 COPD and pneumonia Patient got antibiotic treatment for 2 days but did not improve --hypoxic on presentation, placed on 4L O2 and started on vanc/cefe.  Vanc then d/c'ed after MRSA screen neg. PLAN: --Continue supplemental oxygen and gradually wean off --cont cefe for a 5-day course --continue scheduled DuoNeb and bronchodilator inhalers --scheduled chest PT with RT for mucus clearance   # Hypokalemia --monitor and replete PRN with oral potassium  # Lung cancer metastatic stage 4, status post chemotherapy. --chemo on hold due to intolerance --CT chest showed worsening malignancy PLAN: --oncology following --Pt currently does not want comfort care  # weight loss, decreased appetite, inadequate oral intake --due to cancer, chemo --supplements per nutrition consult --Ensure  # Chronic back pain --cont home oxycodone PRN --cont home gabapentin  # Glaucoma --cont home eye drops  # Leukocytosis --WBC gradually trending up, however, procal trending down, and pt improving clinically. PLAN: --trend WBC and  procal   DVT prophylaxis: Lovenox SQ Code Status: DNR  Family Communication:  Status is: inpatient Dispo:   The patient is from: home Anticipated d/c is to: SNF rehab Anticipated d/c date is: 2-3 days Patient currently is not medically stable to d/c due to: significant hypoxia, worsening lung cancer, PNA, currently on IV abx   Subjective and Interval History:  Pt felt better today, and said she would "hang around" for a while longer.    Objective: Vitals:   07/27/20 0310 07/27/20 0808 07/27/20 0830 07/27/20 1403  BP:   (!) 146/76   Pulse:  94 98 100  Resp:  16 18 16   Temp:   97.9 F (36.6 C)   TempSrc:   Oral   SpO2: 97% 93% 95% 92%  Weight:      Height:        Intake/Output Summary (Last 24 hours) at 07/27/2020 1504 Last data filed at 07/27/2020 1024 Gross per 24 hour  Intake 480 ml  Output --  Net 480 ml   Filed Weights   07/23/20 1750  Weight: 60.3 kg    Examination:   Constitutional: NAD, AAOx3 HEENT: conjunctivae and lids normal, EOMI CV: RRR no M,R,G. Distal pulses +2.  No cyanosis.   RESP: lung sounds more clear, normal respiratory effort, on 2L GI: +BS, NTND Extremities: No effusions, edema in BLE SKIN: warm, dry and intact Neuro: II - XII grossly intact.  Sensation intact Psych: depressed mood and affect.      Data Reviewed: I have personally reviewed following labs and imaging studies  CBC: Recent Labs  Lab 07/23/20 1134  07/24/20 0357 07/25/20 0423 07/26/20 0451 07/27/20 0645  WBC 13.3* 9.3 10.8* 12.0* 13.2*  NEUTROABS 12.2*  --   --   --   --   HGB 9.1* 8.5* 9.5* 9.0* 10.0*  HCT 27.9* 26.1* 31.2* 28.3* 31.1*  MCV 86.1 86.4 90.7 87.3 88.9  PLT 446* 311 372 363 326   Basic Metabolic Panel: Recent Labs  Lab 07/23/20 1134 07/24/20 0357 07/25/20 0423 07/26/20 0451 07/27/20 0645  NA 138 139 140 138 138  K 3.2* 4.1 3.8 3.4* 3.4*  CL 106 108 106 104 103  CO2 20* 24 24 25 25   GLUCOSE 143* 139* 110* 104* 109*  BUN 31* 30* 29* 25*  18  CREATININE 0.79 0.91 0.85 0.82 0.85  CALCIUM 8.8* 8.9 9.0 8.8* 8.8*  MG  --  1.9 2.1 1.8 2.2  PHOS  --  3.1  --   --   --    GFR: Estimated Creatinine Clearance: 44.1 mL/min (by C-G formula based on SCr of 0.85 mg/dL). Liver Function Tests: Recent Labs  Lab 07/21/20 2018 07/23/20 1805  AST 14* 56*  ALT 12 33  ALKPHOS 82 96  BILITOT 0.9 0.6  PROT 8.0 8.0  ALBUMIN 2.8* 2.6*   No results for input(s): LIPASE, AMYLASE in the last 168 hours. No results for input(s): AMMONIA in the last 168 hours. Coagulation Profile: Recent Labs  Lab 07/23/20 1605  INR 1.0   Cardiac Enzymes: No results for input(s): CKTOTAL, CKMB, CKMBINDEX, TROPONINI in the last 168 hours. BNP (last 3 results) No results for input(s): PROBNP in the last 8760 hours. HbA1C: No results for input(s): HGBA1C in the last 72 hours. CBG: No results for input(s): GLUCAP in the last 168 hours. Lipid Profile: No results for input(s): CHOL, HDL, LDLCALC, TRIG, CHOLHDL, LDLDIRECT in the last 72 hours. Thyroid Function Tests: No results for input(s): TSH, T4TOTAL, FREET4, T3FREE, THYROIDAB in the last 72 hours. Anemia Panel: No results for input(s): VITAMINB12, FOLATE, FERRITIN, TIBC, IRON, RETICCTPCT in the last 72 hours. Sepsis Labs: Recent Labs  Lab 07/23/20 1805 07/23/20 2002 07/24/20 0357 07/26/20 0451  PROCALCITON  --   --  1.93 0.96  LATICACIDVEN 1.6 1.3  --   --     Recent Results (from the past 240 hour(s))  Respiratory Panel by RT PCR (Flu A&B, Covid) - Nasopharyngeal Swab     Status: None   Collection Time: 07/21/20  8:25 PM   Specimen: Nasopharyngeal Swab  Result Value Ref Range Status   SARS Coronavirus 2 by RT PCR NEGATIVE NEGATIVE Final    Comment: (NOTE) SARS-CoV-2 target nucleic acids are NOT DETECTED.  The SARS-CoV-2 RNA is generally detectable in upper respiratoy specimens during the acute phase of infection. The lowest concentration of SARS-CoV-2 viral copies this assay can detect  is 131 copies/mL. A negative result does not preclude SARS-Cov-2 infection and should not be used as the sole basis for treatment or other patient management decisions. A negative result may occur with  improper specimen collection/handling, submission of specimen other than nasopharyngeal swab, presence of viral mutation(s) within the areas targeted by this assay, and inadequate number of viral copies (<131 copies/mL). A negative result must be combined with clinical observations, patient history, and epidemiological information. The expected result is Negative.  Fact Sheet for Patients:  PinkCheek.be  Fact Sheet for Healthcare Providers:  GravelBags.it  This test is no t yet approved or cleared by the [place] FDA and  has been authorized for detection  and/or diagnosis of SARS-CoV-2 by FDA under an Emergency Use Authorization (EUA). This EUA will remain  in effect (meaning this test can be used) for the duration of the COVID-19 declaration under Section 564(b)(1) of the Act, 21 U.S.C. section 360bbb-3(b)(1), unless the authorization is terminated or revoked sooner.     Influenza A by PCR NEGATIVE NEGATIVE Final   Influenza B by PCR NEGATIVE NEGATIVE Final    Comment: (NOTE) The Xpert Xpress SARS-CoV-2/FLU/RSV assay is intended as an aid in  the diagnosis of influenza from Nasopharyngeal swab specimens and  should not be used as a sole basis for treatment. Nasal washings and  aspirates are unacceptable for Xpert Xpress SARS-CoV-2/FLU/RSV  testing.  Fact Sheet for Patients: PinkCheek.be  Fact Sheet for Healthcare Providers: GravelBags.it  This test is not yet approved or cleared by the [place] FDA and  has been authorized for detection and/or diagnosis of SARS-CoV-2 by  FDA under an Emergency Use Authorization (EUA). This EUA will remain  in effect  (meaning this test can be used) for the duration of the  Covid-19 declaration under Section 564(b)(1) of the Act, 21  U.S.C. section 360bbb-3(b)(1), unless the authorization is  terminated or revoked. Performed at [facility], [address] [place], [place] [address]   Gastrointestinal Panel by PCR , Stool     Status: None   Collection Time: 07/22/20 12:20 PM   Specimen: Stool  Result Value Ref Range Status   Campylobacter species NOT DETECTED NOT DETECTED Final   Plesimonas shigelloides NOT DETECTED NOT DETECTED Final   Salmonella species NOT DETECTED NOT DETECTED Final   Yersinia enterocolitica NOT DETECTED NOT DETECTED Final   Vibrio species NOT DETECTED NOT DETECTED Final   Vibrio cholerae NOT DETECTED NOT DETECTED Final   Enteroaggregative E coli (EAEC) NOT DETECTED NOT DETECTED Final   Enteropathogenic E coli (EPEC) NOT DETECTED NOT DETECTED Final   Enterotoxigenic E coli (ETEC) NOT DETECTED NOT DETECTED Final   Shiga like toxin producing E coli (STEC) NOT DETECTED NOT DETECTED Final   Shigella/Enteroinvasive E coli (EIEC) NOT DETECTED NOT DETECTED Final   Cryptosporidium NOT DETECTED NOT DETECTED Final   Cyclospora cayetanensis NOT DETECTED NOT DETECTED Final   Entamoeba histolytica NOT DETECTED NOT DETECTED Final   Giardia lamblia NOT DETECTED NOT DETECTED Final   Adenovirus F40/41 NOT DETECTED NOT DETECTED Final   Astrovirus NOT DETECTED NOT DETECTED Final   Norovirus GI/GII NOT DETECTED NOT DETECTED Final   Rotavirus A NOT DETECTED NOT DETECTED Final   Sapovirus (I, II, IV, and V) NOT DETECTED NOT DETECTED Final    Comment: Performed at [facility], [address] [place], [place] [address]  C difficile quick screen w PCR reflex     Status: None   Collection Time: 07/22/20 12:20 PM   Specimen: STOOL  Result Value Ref Range Status   C Diff antigen NEGATIVE NEGATIVE Final   C Diff toxin NEGATIVE NEGATIVE Final   C Diff interpretation No C.  difficile detected.  Final    Comment: Performed at [facility], [address] [place], [place] [address]  Culture, blood (Routine x 2)     Status: None (Preliminary result)   Collection Time: 07/23/20  6:05 PM   Specimen: BLOOD  Result Value Ref Range Status   Specimen Description BLOOD BLOOD LEFT ARM  Final   Special Requests   Final    BOTTLES DRAWN AEROBIC AND ANAEROBIC Blood Culture adequate volume  Culture   Final    NO GROWTH 4 DAYS Performed at [facility], [address] [place], [place] [address]    Report Status PENDING  Incomplete  Culture, blood (Routine x 2)     Status: None (Preliminary result)   Collection Time: 07/23/20  6:06 PM   Specimen: BLOOD  Result Value Ref Range Status   Specimen Description BLOOD BLOOD RIGHT ARM  Final   Special Requests   Final    BOTTLES DRAWN AEROBIC AND ANAEROBIC Blood Culture adequate volume   Culture   Final    NO GROWTH 4 DAYS Performed at [facility], [address], [place], [place] [address]    Report Status PENDING  Incomplete  Respiratory Panel by RT PCR (Flu A&B, Covid) - Nasopharyngeal Swab     Status: None   Collection Time: 07/23/20  8:02 PM   Specimen: Nasopharyngeal Swab  Result Value Ref Range Status   SARS Coronavirus 2 by RT PCR NEGATIVE NEGATIVE Final    Comment: (NOTE) SARS-CoV-2 target nucleic acids are NOT DETECTED.  The SARS-CoV-2 RNA is generally detectable in upper respiratoy specimens during the acute phase of infection. The lowest concentration of SARS-CoV-2 viral copies this assay can detect is 131 copies/mL. A negative result does not preclude SARS-Cov-2 infection and should not be used as the sole basis for treatment or other patient management decisions. A negative result may occur with  improper specimen collection/handling, submission of specimen other than nasopharyngeal swab, presence of viral mutation(s) within the areas targeted by this assay, and  inadequate number of viral copies (<131 copies/mL). A negative result must be combined with clinical observations, patient history, and epidemiological information. The expected result is Negative.  Fact Sheet for Patients:  PinkCheek.be  Fact Sheet for Healthcare Providers:  GravelBags.it  This test is no t yet approved or cleared by the [place] FDA and  has been authorized for detection and/or diagnosis of SARS-CoV-2 by FDA under an Emergency Use Authorization (EUA). This EUA will remain  in effect (meaning this test can be used) for the duration of the COVID-19 declaration under Section 564(b)(1) of the Act, 21 U.S.C. section 360bbb-3(b)(1), unless the authorization is terminated or revoked sooner.     Influenza A by PCR NEGATIVE NEGATIVE Final   Influenza B by PCR NEGATIVE NEGATIVE Final    Comment: (NOTE) The Xpert Xpress SARS-CoV-2/FLU/RSV assay is intended as an aid in  the diagnosis of influenza from Nasopharyngeal swab specimens and  should not be used as a sole basis for treatment. Nasal washings and  aspirates are unacceptable for Xpert Xpress SARS-CoV-2/FLU/RSV  testing.  Fact Sheet for Patients: PinkCheek.be  Fact Sheet for Healthcare Providers: GravelBags.it  This test is not yet approved or cleared by the [place] FDA and  has been authorized for detection and/or diagnosis of SARS-CoV-2 by  FDA under an Emergency Use Authorization (EUA). This EUA will remain  in effect (meaning this test can be used) for the duration of the  Covid-19 declaration under Section 564(b)(1) of the Act, 21  U.S.C. section 360bbb-3(b)(1), unless the authorization is  terminated or revoked. Performed at [facility], [address] [place], [place] [address]   MRSA PCR Screening     Status: None   Collection Time: 07/24/20  4:05 PM   Specimen:  Nasal Mucosa; Nasopharyngeal  Result Value Ref Range Status   MRSA by PCR NEGATIVE NEGATIVE Final    Comment:  The GeneXpert MRSA Assay (FDA approved for NASAL specimens only), is one component of a comprehensive MRSA colonization surveillance program. It is not intended to diagnose MRSA infection nor to guide or monitor treatment for MRSA infections. Performed at [facility], [address] [place], [place] [address]       Radiology Studies: No results found.   Scheduled Meds: . Chlorhexidine Gluconate Cloth  6 each Topical Daily  . cholecalciferol  2,000 Units Oral Daily  . enoxaparin (LOVENOX) injection  40 mg Subcutaneous Q24H  . umeclidinium bromide  1 puff Inhalation Daily   And  . fluticasone furoate-vilanterol  1 puff Inhalation Daily  . gabapentin  100 mg Oral TID  . guaiFENesin  600 mg Oral BID  . influenza vaccine adjuvanted  0.5 mL Intramuscular Tomorrow-1000  . ipratropium-albuterol  3 mL Nebulization Q6H  . latanoprost  1 drop Both Eyes QHS  . pneumococcal 23 valent vaccine  0.5 mL Intramuscular Tomorrow-1000  . Ensure Max Protein  11 oz Oral BID  . sodium chloride flush  3 mL Intravenous Q12H   Continuous Infusions: . sodium chloride 250 mL (07/25/20 2058)  . ceFEPime (MAXIPIME) IV 2 g (07/27/20 0908)     LOS: 4 days     Enzo Bi, MD [facility] If 7PM-7AM, please contact night-coverage 07/27/2020, 3:04 PM

## 2020-07-27 NOTE — Evaluation (Signed)
Physical Therapy Evaluation Patient Details Name: Brenda Parker MRN: [id number] DOB: [birth year] Today's Date: 07/27/2020   History of Present Illness  Patient is an 84 y.o. female with past medical history of metastatic lung cancer status post chemotherapy, COPD, HTN, HLD, CKD, chronic back pain presented at [facility] ED due to worsening of shortness of breath. Work up reveals acute on chronic hyper respiratory failure due to COPD and pneumonia.   Clinical Impression  PT evaluation completed. Patient is agreeable to get OOB with PT. Patient needs Mod A for bed mobility and Min A for sit to stand transfers. Cues for technique and safety required with all mobility. Patient ambulated a short distance with rolling walker with Min A for steadying. Standing activity tolerance is limited by fatigue, shortness of breath, and Sp02 decreased to 86% with activity. Patient needs short seated rest break to recover in to the low 90's. Patient previously lives at home alone and is independent with mobility with occasional use of four wheeled walker. Recommend PT to address current functional limitations listed below. At this time, SNF is recommended for ongoing PT efforts at discharge.    Follow Up Recommendations SNF    Equipment Recommendations  None recommended by PT    Recommendations for Other Services       Precautions / Restrictions Precautions Precautions: Fall Precaution Comments: monitor Sp02 with activity  Restrictions Weight Bearing Restrictions: No      Mobility  Bed Mobility Overal bed mobility: Needs Assistance Bed Mobility: Supine to Sit;Sit to Supine     Supine to sit: Mod assist Sit to supine: Mod assist   General bed mobility comments: assistance for trunk support to sit up on edge of bed. assistance for BLE support with return to bed. increased time required due to fatigue   Transfers Overall transfer level: Needs assistance   Transfers: Sit to/from Stand Sit to Stand: Min  assist         General transfer comment: Min A for transfers from bed surface and from bed side commode. verbal cues for technique   Ambulation/Gait Ambulation/Gait assistance: Min assist Gait Distance (Feet): 2 Feet Assistive device: Rolling walker (2 wheeled) Gait Pattern/deviations: Narrow base of support Gait velocity: decreased    General Gait Details: verbal cues for technique. Min A provided for steadying   Stairs            Wheelchair Mobility    Modified Rankin (Stroke Patients Only)       Balance Overall balance assessment: Needs assistance Sitting-balance support: Feet supported;Bilateral upper extremity supported Sitting balance-Leahy Scale: Fair     Standing balance support: Bilateral upper extremity supported;During functional activity Standing balance-Leahy Scale: Fair Standing balance comment: CGA provided for safety                              Pertinent Vitals/Pain Pain Assessment: No/denies pain    Home Living Family/patient expects to be discharged to:: Private residence Living Arrangements: Alone Available Help at Discharge: Available PRN/intermittently;Friend(s);Family Type of Home: House       Home Layout: One level Home Equipment: Clinical cytogeneticist - 4 wheels;Toilet riser Additional Comments: friends help with grocery shopping at baseline.     Prior Function Level of Independence: Independent with assistive device(s)         Comments: patient reports using 4 wheeled walker for ambulation intermittently. patient has been driving some up until recently. patient reports she can only  stand in 5-6 minute bouts due to fatigue      Hand Dominance        Extremity/Trunk Assessment   Upper Extremity Assessment Upper Extremity Assessment: Generalized weakness    Lower Extremity Assessment Lower Extremity Assessment: Generalized weakness       Communication   Communication: No difficulties  Cognition  Arousal/Alertness: Awake/alert Behavior During Therapy: WFL for tasks assessed/performed Overall Cognitive Status: Within Functional Limits for tasks assessed                                        General Comments      Exercises     Assessment/Plan    PT Assessment Patient needs continued PT services  PT Problem List Decreased strength;Decreased activity tolerance;Decreased balance;Decreased mobility;Decreased knowledge of use of DME;Decreased cognition;Cardiopulmonary status limiting activity;Decreased safety awareness;Decreased knowledge of precautions       PT Treatment Interventions DME instruction;Gait training;Stair training;Functional mobility training;Therapeutic activities;Therapeutic exercise;Balance training;Neuromuscular re-education;Patient/family education    PT Goals (Current goals can be found in the Care Plan section)  Acute Rehab PT Goals Patient Stated Goal: to go home  PT Goal Formulation: With patient Time For Goal Achievement: 08/10/20 Potential to Achieve Goals: Good    Frequency Min 2X/week   Barriers to discharge        Co-evaluation               AM-PAC PT "6 Clicks" Mobility  Outcome Measure Help needed turning from your back to your side while in a flat bed without using bedrails?: A Little Help needed moving from lying on your back to sitting on the side of a flat bed without using bedrails?: A Lot Help needed moving to and from a bed to a chair (including a wheelchair)?: A Lot Help needed standing up from a chair using your arms (e.g., wheelchair or bedside chair)?: A Little Help needed to walk in hospital room?: A Little Help needed climbing 3-5 steps with a railing? : A Lot 6 Click Score: 15    End of Session   Activity Tolerance: Patient limited by fatigue;Patient limited by lethargy Patient left: in bed;with call bell/phone within reach;with bed alarm set Nurse Communication: Mobility status PT Visit  Diagnosis: Muscle weakness (generalized) (M62.81);Unsteadiness on feet (R26.81);Other abnormalities of gait and mobility (R26.89)    Time: 5993-5701 PT Time Calculation (min) (ACUTE ONLY): 23 min   Charges:   PT Evaluation $PT Eval Moderate Complexity: 1 Mod PT Treatments $Therapeutic Activity: 8-22 mins        Minna Merritts, PT, MPT   Percell Locus 07/27/2020, 2:37 PM

## 2020-07-28 ENCOUNTER — Inpatient Hospital Stay: Payer: Medicare Other

## 2020-07-28 ENCOUNTER — Telehealth: Payer: Self-pay | Admitting: *Deleted

## 2020-07-28 LAB — CULTURE, BLOOD (ROUTINE X 2)
Culture: NO GROWTH
Culture: NO GROWTH
Special Requests: ADEQUATE
Special Requests: ADEQUATE

## 2020-07-28 LAB — CBC
HCT: 32.1 % — ABNORMAL LOW (ref 36.0–46.0)
Hemoglobin: 9.9 g/dL — ABNORMAL LOW (ref 12.0–15.0)
MCH: 27.6 pg (ref 26.0–34.0)
MCHC: 30.8 g/dL (ref 30.0–36.0)
MCV: 89.4 fL (ref 80.0–100.0)
Platelets: 415 10*3/uL — ABNORMAL HIGH (ref 150–400)
RBC: 3.59 MIL/uL — ABNORMAL LOW (ref 3.87–5.11)
RDW: 17.4 % — ABNORMAL HIGH (ref 11.5–15.5)
WBC: 14.7 10*3/uL — ABNORMAL HIGH (ref 4.0–10.5)
nRBC: 0.2 % (ref 0.0–0.2)

## 2020-07-28 LAB — BASIC METABOLIC PANEL
Anion gap: 9 (ref 5–15)
BUN: 22 mg/dL (ref 8–23)
CO2: 26 mmol/L (ref 22–32)
Calcium: 9.1 mg/dL (ref 8.9–10.3)
Chloride: 103 mmol/L (ref 98–111)
Creatinine, Ser: 0.87 mg/dL (ref 0.44–1.00)
GFR calc Af Amer: >60 mL/min (ref >60)
GFR calc non Af Amer: >60 mL/min (ref >60)
Glucose, Bld: 104 mg/dL — ABNORMAL HIGH (ref 70–99)
Potassium: 4 mmol/L (ref 3.5–5.1)
Sodium: 138 mmol/L (ref 135–145)

## 2020-07-28 LAB — MAGNESIUM: Magnesium: 2.2 mg/dL (ref 1.7–2.4)

## 2020-07-28 LAB — PROCALCITONIN: Procalcitonin: 0.52 ng/mL

## 2020-07-28 MED ORDER — PREDNISONE 20 MG PO TABS
20.0000 mg | ORAL_TABLET | Freq: Every day | ORAL | Status: DC
  Administered 2020-07-28 – 2020-07-30 (×3): 20 mg via ORAL
  Filled 2020-07-28 (×3): qty 1

## 2020-07-28 NOTE — Progress Notes (Signed)
PROGRESS NOTE    FLORNCE RECORD  GYJ:[id number] DOB: 23-Nov-1935 DOA: 07/23/2020 PCP: Leonel Ramsay, MD    Assessment & Plan:   Principal Problem:   Pneumonia Active Problems:   Hypertension   Primary cancer of right upper lobe of lung ([place])   Multifocal pneumonia   Malnutrition of moderate degree   Acute on chronic respiratory failure with hypoxia ([place])    Brenda Parker is a 84 y.o. female with Past medical history of lung cancer status post chemotherapy, COPD, HTN, HLD, CKD presented at [facility] ED due to worsening of shortness of breath. Patient was treated as an outpatient with antibiotics for 2 days but her shortness of breath got worse and she was hypoxic. Patient has oxygen at home but she was not using it. Patient denied any chest pain or palpitations, denied any abdominal pain, no nausea vomiting or diarrhea.  # Acute on chronic hyper respiratory failure 2/2 COPD and pneumonia Patient got antibiotic treatment for 2 days but did not improve --hypoxic on presentation, placed on 4L O2 and started on vanc/cefe.  Vanc then d/c'ed after MRSA screen neg. --completed 5-day cefepime. PLAN: --continue O2 at 2L --continue scheduled DuoNeb and bronchodilator inhalers --scheduled chest PT with RT for mucus clearance  --CXR today, per Onc  # Hypokalemia --monitor and replete PRN with oral potassium  # Lung cancer metastatic stage 4, status post chemotherapy. --currently on "chemo holiday" --CT chest showed worsening malignancy PLAN: --oncology following --Pt currently does not want comfort care --CXR today, per Onc  # weight loss, decreased appetite, inadequate oral intake --due to cancer, chemo --supplements per nutrition consult --Ensure max protein BID --Start prednisone 20 mg daily, per Onc  # Chronic back pain --cont home oxycodone PRN --cont home gabapentin  # Glaucoma --cont home eye drops  # Leukocytosis --WBC gradually trending up, however, procal  trending down, and pt improving clinically. PLAN: --trend WBC and procal   DVT prophylaxis: Lovenox SQ Code Status: DNR  Family Communication: son updated at the bedside today Status is: inpatient Dispo:   The patient is from: home Anticipated d/c is to: SNF rehab Anticipated d/c date is: whenever bed available Patient currently is medically stable to d/c.   Subjective and Interval History:  Pt reported feeling better in the afternoon.  Agreeable to short-term rehab.     Objective: Vitals:   07/28/20 0152 07/28/20 0757 07/28/20 1535 07/28/20 1550  BP:   104/60   Pulse:   93   Resp:   19   Temp:   97.8 F (36.6 C)   TempSrc:      SpO2: 94% 94% 99% 99%  Weight:      Height:        Intake/Output Summary (Last 24 hours) at 07/28/2020 1954 Last data filed at 07/28/2020 1003 Gross per 24 hour  Intake 120 ml  Output 0 ml  Net 120 ml   Filed Weights   07/23/20 1750  Weight: 60.3 kg    Examination:   Constitutional: NAD, AAOx3, calm HEENT: conjunctivae and lids normal, EOMI CV: RRR no M,R,G. Distal pulses +2.  No cyanosis.   RESP: much better lung sounds, normal respiratory effort, on 2L GI: +BS, NTND Extremities: No effusions, edema in BLE SKIN: warm, dry and intact Neuro: II - XII grossly intact.  Sensation intact    Data Reviewed: I have personally reviewed following labs and imaging studies  CBC: Recent Labs  Lab 07/23/20 1134 07/23/20 1134 07/24/20 0357  07/25/20 0423 07/26/20 0451 07/27/20 0645 07/28/20 0330  WBC 13.3*   < > 9.3 10.8* 12.0* 13.2* 14.7*  NEUTROABS 12.2*  --   --   --   --   --   --   HGB 9.1*   < > 8.5* 9.5* 9.0* 10.0* 9.9*  HCT 27.9*   < > 26.1* 31.2* 28.3* 31.1* 32.1*  MCV 86.1   < > 86.4 90.7 87.3 88.9 89.4  PLT 446*   < > 311 372 363 375 415*   < > = values in this interval not displayed.   Basic Metabolic Panel: Recent Labs  Lab 07/24/20 0357 07/25/20 0423 07/26/20 0451 07/27/20 0645 07/28/20 0330  NA 139 140 138  138 138  K 4.1 3.8 3.4* 3.4* 4.0  CL 108 106 104 103 103  CO2 24 24 25 25 26   GLUCOSE 139* 110* 104* 109* 104*  BUN 30* 29* 25* 18 22  CREATININE 0.91 0.85 0.82 0.85 0.87  CALCIUM 8.9 9.0 8.8* 8.8* 9.1  MG 1.9 2.1 1.8 2.2 2.2  PHOS 3.1  --   --   --   --    GFR: Estimated Creatinine Clearance: 43.1 mL/min (by C-G formula based on SCr of 0.87 mg/dL). Liver Function Tests: Recent Labs  Lab 07/21/20 2018 07/23/20 1805  AST 14* 56*  ALT 12 33  ALKPHOS 82 96  BILITOT 0.9 0.6  PROT 8.0 8.0  ALBUMIN 2.8* 2.6*   No results for input(s): LIPASE, AMYLASE in the last 168 hours. No results for input(s): AMMONIA in the last 168 hours. Coagulation Profile: Recent Labs  Lab 07/23/20 1605  INR 1.0   Cardiac Enzymes: No results for input(s): CKTOTAL, CKMB, CKMBINDEX, TROPONINI in the last 168 hours. BNP (last 3 results) No results for input(s): PROBNP in the last 8760 hours. HbA1C: No results for input(s): HGBA1C in the last 72 hours. CBG: No results for input(s): GLUCAP in the last 168 hours. Lipid Profile: No results for input(s): CHOL, HDL, LDLCALC, TRIG, CHOLHDL, LDLDIRECT in the last 72 hours. Thyroid Function Tests: No results for input(s): TSH, T4TOTAL, FREET4, T3FREE, THYROIDAB in the last 72 hours. Anemia Panel: No results for input(s): VITAMINB12, FOLATE, FERRITIN, TIBC, IRON, RETICCTPCT in the last 72 hours. Sepsis Labs: Recent Labs  Lab 07/23/20 1805 07/23/20 2002 07/24/20 0357 07/26/20 0451 07/28/20 0330  PROCALCITON  --   --  1.93 0.96 0.52  LATICACIDVEN 1.6 1.3  --   --   --     Recent Results (from the past 240 hour(s))  Respiratory Panel by RT PCR (Flu A&B, Covid) - Nasopharyngeal Swab     Status: None   Collection Time: 07/21/20  8:25 PM   Specimen: Nasopharyngeal Swab  Result Value Ref Range Status   SARS Coronavirus 2 by RT PCR NEGATIVE NEGATIVE Final    Comment: (NOTE) SARS-CoV-2 target nucleic acids are NOT DETECTED.  The SARS-CoV-2 RNA is  generally detectable in upper respiratoy specimens during the acute phase of infection. The lowest concentration of SARS-CoV-2 viral copies this assay can detect is 131 copies/mL. A negative result does not preclude SARS-Cov-2 infection and should not be used as the sole basis for treatment or other patient management decisions. A negative result may occur with  improper specimen collection/handling, submission of specimen other than nasopharyngeal swab, presence of viral mutation(s) within the areas targeted by this assay, and inadequate number of viral copies (<131 copies/mL). A negative result must be combined with clinical observations, patient  history, and epidemiological information. The expected result is Negative.  Fact Sheet for Patients:  PinkCheek.be  Fact Sheet for Healthcare Providers:  GravelBags.it  This test is no t yet approved or cleared by the [place] FDA and  has been authorized for detection and/or diagnosis of SARS-CoV-2 by FDA under an Emergency Use Authorization (EUA). This EUA will remain  in effect (meaning this test can be used) for the duration of the COVID-19 declaration under Section 564(b)(1) of the Act, 21 U.S.C. section 360bbb-3(b)(1), unless the authorization is terminated or revoked sooner.     Influenza A by PCR NEGATIVE NEGATIVE Final   Influenza B by PCR NEGATIVE NEGATIVE Final    Comment: (NOTE) The Xpert Xpress SARS-CoV-2/FLU/RSV assay is intended as an aid in  the diagnosis of influenza from Nasopharyngeal swab specimens and  should not be used as a sole basis for treatment. Nasal washings and  aspirates are unacceptable for Xpert Xpress SARS-CoV-2/FLU/RSV  testing.  Fact Sheet for Patients: PinkCheek.be  Fact Sheet for Healthcare Providers: GravelBags.it  This test is not yet approved or cleared by the [place] FDA and  has been authorized for detection and/or diagnosis of SARS-CoV-2 by  FDA under an Emergency Use Authorization (EUA). This EUA will remain  in effect (meaning this test can be used) for the duration of the  Covid-19 declaration under Section 564(b)(1) of the Act, 21  U.S.C. section 360bbb-3(b)(1), unless the authorization is  terminated or revoked. Performed at [facility], [address] [place], [place] [address]   Gastrointestinal Panel by PCR , Stool     Status: None   Collection Time: 07/22/20 12:20 PM   Specimen: Stool  Result Value Ref Range Status   Campylobacter species NOT DETECTED NOT DETECTED Final   Plesimonas shigelloides NOT DETECTED NOT DETECTED Final   Salmonella species NOT DETECTED NOT DETECTED Final   Yersinia enterocolitica NOT DETECTED NOT DETECTED Final   Vibrio species NOT DETECTED NOT DETECTED Final   Vibrio cholerae NOT DETECTED NOT DETECTED Final   Enteroaggregative E coli (EAEC) NOT DETECTED NOT DETECTED Final   Enteropathogenic E coli (EPEC) NOT DETECTED NOT DETECTED Final   Enterotoxigenic E coli (ETEC) NOT DETECTED NOT DETECTED Final   Shiga like toxin producing E coli (STEC) NOT DETECTED NOT DETECTED Final   Shigella/Enteroinvasive E coli (EIEC) NOT DETECTED NOT DETECTED Final   Cryptosporidium NOT DETECTED NOT DETECTED Final   Cyclospora cayetanensis NOT DETECTED NOT DETECTED Final   Entamoeba histolytica NOT DETECTED NOT DETECTED Final   Giardia lamblia NOT DETECTED NOT DETECTED Final   Adenovirus F40/41 NOT DETECTED NOT DETECTED Final   Astrovirus NOT DETECTED NOT DETECTED Final   Norovirus GI/GII NOT DETECTED NOT DETECTED Final   Rotavirus A NOT DETECTED NOT DETECTED Final   Sapovirus (I, II, IV, and V) NOT DETECTED NOT DETECTED Final    Comment: Performed at [facility], [address] [place], [place] [address]  C difficile quick screen w PCR reflex     Status: None   Collection Time: 07/22/20 12:20 PM    Specimen: STOOL  Result Value Ref Range Status   C Diff antigen NEGATIVE NEGATIVE Final   C Diff toxin NEGATIVE NEGATIVE Final   C Diff interpretation No C. difficile detected.  Final    Comment: Performed at [facility], [address] [place], [place] [address]  Culture, blood (Routine x 2)     Status: None   Collection Time: 07/23/20  6:05 PM   Specimen: BLOOD  Result Value Ref Range Status   Specimen Description BLOOD BLOOD LEFT ARM  Final   Special Requests   Final    BOTTLES DRAWN AEROBIC AND ANAEROBIC Blood Culture adequate volume   Culture   Final    NO GROWTH 5 DAYS Performed at [facility], [address] [place], [place] [address]    Report Status 07/28/2020 FINAL  Final  Culture, blood (Routine x 2)     Status: None   Collection Time: 07/23/20  6:06 PM   Specimen: BLOOD  Result Value Ref Range Status   Specimen Description BLOOD BLOOD RIGHT ARM  Final   Special Requests   Final    BOTTLES DRAWN AEROBIC AND ANAEROBIC Blood Culture adequate volume   Culture   Final    NO GROWTH 5 DAYS Performed at [facility], [address] [place], [place] [address]    Report Status 07/28/2020 FINAL  Final  Respiratory Panel by RT PCR (Flu A&B, Covid) - Nasopharyngeal Swab     Status: None   Collection Time: 07/23/20  8:02 PM   Specimen: Nasopharyngeal Swab  Result Value Ref Range Status   SARS Coronavirus 2 by RT PCR NEGATIVE NEGATIVE Final    Comment: (NOTE) SARS-CoV-2 target nucleic acids are NOT DETECTED.  The SARS-CoV-2 RNA is generally detectable in upper respiratoy specimens during the acute phase of infection. The lowest concentration of SARS-CoV-2 viral copies this assay can detect is 131 copies/mL. A negative result does not preclude SARS-Cov-2 infection and should not be used as the sole basis for treatment or other patient management decisions. A negative result may occur with  improper specimen collection/handling, submission of  specimen other than nasopharyngeal swab, presence of viral mutation(s) within the areas targeted by this assay, and inadequate number of viral copies (<131 copies/mL). A negative result must be combined with clinical observations, patient history, and epidemiological information. The expected result is Negative.  Fact Sheet for Patients:  PinkCheek.be  Fact Sheet for Healthcare Providers:  GravelBags.it  This test is no t yet approved or cleared by the [place] FDA and  has been authorized for detection and/or diagnosis of SARS-CoV-2 by FDA under an Emergency Use Authorization (EUA). This EUA will remain  in effect (meaning this test can be used) for the duration of the COVID-19 declaration under Section 564(b)(1) of the Act, 21 U.S.C. section 360bbb-3(b)(1), unless the authorization is terminated or revoked sooner.     Influenza A by PCR NEGATIVE NEGATIVE Final   Influenza B by PCR NEGATIVE NEGATIVE Final    Comment: (NOTE) The Xpert Xpress SARS-CoV-2/FLU/RSV assay is intended as an aid in  the diagnosis of influenza from Nasopharyngeal swab specimens and  should not be used as a sole basis for treatment. Nasal washings and  aspirates are unacceptable for Xpert Xpress SARS-CoV-2/FLU/RSV  testing.  Fact Sheet for Patients: PinkCheek.be  Fact Sheet for Healthcare Providers: GravelBags.it  This test is not yet approved or cleared by the [place] FDA and  has been authorized for detection and/or diagnosis of SARS-CoV-2 by  FDA under an Emergency Use Authorization (EUA). This EUA will remain  in effect (meaning this test can be used) for the duration of the  Covid-19 declaration under Section 564(b)(1) of the Act, 21  U.S.C. section 360bbb-3(b)(1), unless the authorization is  terminated or revoked. Performed at [facility], [address] [place], [place] [address]   MRSA PCR  Screening     Status: None   Collection Time: 07/24/20  4:05 PM   Specimen: Nasal Mucosa; Nasopharyngeal  Result Value Ref Range Status   MRSA by PCR NEGATIVE NEGATIVE Final    Comment:        The GeneXpert MRSA Assay (FDA approved for NASAL specimens only), is one component of a comprehensive MRSA colonization surveillance program. It is not intended to diagnose MRSA infection nor to guide or monitor treatment for MRSA infections. Performed at [facility], [address], [place], [place] [address]       Radiology Studies: DG Chest 2 View  Result Date: 07/28/2020 CLINICAL DATA:  History of metastatic lung cancer. EXAM: CHEST - 2 VIEW COMPARISON:  07/21/2020 FINDINGS: Stable cavitary opacity in the right upper lobe with architectural distortion and upward retraction of the hilum. Bibasilar hazy airspace disease. No pleural effusion or pneumothorax. Stable heart size. Left-sided Port-A-Cath in unchanged position. No acute osseous abnormality. IMPRESSION: Bibasilar hazy airspace disease concerning for pneumonia. Stable cavitary opacity in the right upper lobe with architectural distortion and upward retraction of the hilum consistent with malignancy Electronically Signed   By: Kathreen Devoid   On: 07/28/2020 14:11     Scheduled Meds: . Chlorhexidine Gluconate Cloth  6 each Topical Daily  . cholecalciferol  2,000 Units Oral Daily  . enoxaparin (LOVENOX) injection  40 mg Subcutaneous Q24H  . umeclidinium bromide  1 puff Inhalation Daily   And  . fluticasone furoate-vilanterol  1 puff Inhalation Daily  . gabapentin  100 mg Oral TID  . guaiFENesin  600 mg Oral BID  . influenza vaccine adjuvanted  0.5 mL Intramuscular Tomorrow-1000  . ipratropium-albuterol  3 mL Nebulization Q6H  . latanoprost  1 drop Both Eyes QHS  . pneumococcal 23 valent vaccine  0.5 mL Intramuscular Tomorrow-1000  . predniSONE  20 mg Oral Q breakfast  . Ensure Max  Protein  11 oz Oral BID  . sodium chloride flush  3 mL Intravenous Q12H   Continuous Infusions: . sodium chloride 250 mL (07/25/20 2058)     LOS: 5 days     Enzo Bi, MD [facility] If 7PM-7AM, please contact night-coverage 07/28/2020, 7:54 PM

## 2020-07-28 NOTE — NC FL2 (Signed)
[place] LEVEL OF CARE SCREENING TOOL     IDENTIFICATION  Patient Name: Brenda Parker Birthdate: 1936/10/16 Sex: female Admission Date (Current Location): 07/23/2020  [place] and [place] Number:  Engineering geologist and Address:  [facility], [address], [place], [place] [address]      Provider Number: [id number]  Attending Physician Name and Address:  Enzo Bi, MD  Relative Name and Phone Number:  Tatayana Beshears [phone]    Current Level of Care: Hospital Recommended Level of Care: [place] Prior Approval Number:    Date Approved/Denied:   PASRR Number: [id number]  Discharge Plan: SNF    Current Diagnoses: Patient Active Problem List   Diagnosis Date Noted  . Acute on chronic respiratory failure with hypoxia ([place]) 07/23/2020  . Goals of care, counseling/discussion 09/03/2019  . Malnutrition of moderate degree 07/10/2018  . Multifocal pneumonia 07/07/2018  . Pneumonia 06/28/2018  . Primary cancer of right upper lobe of lung ([place]) 03/03/2018  . Hyperlipidemia, unspecified 02/08/2018  . Hypertension 02/08/2018  . Chronic cough 08/05/2017  . Lesion of liver 07/04/2017  . Abnormal CXR 07/01/2017  . Psoriasis of scalp 12/22/2015  . Back pain with left-sided sciatica 06/16/2015  . 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 Impaired fasting glucose 12/13/2012  . Chronic kidney disease 03/28/2012  . Seborrheic dermatitis 03/28/2012    Orientation RESPIRATION BLADDER Height & Weight     Self, Time, Situation, Place  O2 (2L) Continent Weight: 60.3 kg Height:  5\' 4"  (162.6 cm)  BEHAVIORAL SYMPTOMS/MOOD NEUROLOGICAL BOWEL NUTRITION STATUS      Continent Diet (Regular)  AMBULATORY STATUS COMMUNICATION OF NEEDS Skin   Limited Assist Verbally Normal                       Personal Care Assistance Level of Assistance  Bathing, Feeding, Dressing Bathing Assistance: Limited assistance Feeding assistance:  Independent Dressing Assistance: Limited assistance     Functional Limitations Info  Sight, Hearing, Speech Sight Info: Adequate Hearing Info: Adequate Speech Info: Adequate    SPECIAL CARE FACTORS FREQUENCY  PT (By licensed PT), OT (By licensed OT)                    Contractures Contractures Info: Not present    Additional Factors Info  Code Status, Allergies Code Status Info: DNR Allergies Info: PCN           Current Medications (07/28/2020):  This is the current hospital active medication list Current Facility-Administered Medications  Medication Dose Route Frequency Provider Last Rate Last Admin  . 0.9 %  sodium chloride infusion  250 mL Intravenous PRN Val Riles, MD 10 mL/hr at 07/25/20 2058 250 mL at 07/25/20 2058  . acetaminophen (TYLENOL) tablet 650 mg  650 mg Oral Q6H PRN Val Riles, MD   650 mg at 07/28/20 1129   Or  . acetaminophen (TYLENOL) suppository 650 mg  650 mg Rectal Q6H PRN Val Riles, MD      . albuterol (PROVENTIL) (2.5 MG/3ML) 0.083% nebulizer solution 2.5 mg  2.5 mg Nebulization Q4H PRN Enzo Bi, MD   2.5 mg at 07/27/20 0309  . albuterol (VENTOLIN HFA) 108 (90 Base) MCG/ACT inhaler 2 puff  2 puff Inhalation Q6H PRN Val Riles, MD      . Chlorhexidine Gluconate Cloth 2 % PADS 6 each  6 each Topical Daily Enzo Bi, MD   6 each at 07/28/20 1144  . cholecalciferol (  VITAMIN D3) tablet 2,000 Units  2,000 Units Oral Daily Val Riles, MD   2,000 Units at 07/28/20 0854  . enoxaparin (LOVENOX) injection 40 mg  40 mg Subcutaneous Q24H Val Riles, MD   40 mg at 07/27/20 2043  . umeclidinium bromide (INCRUSE ELLIPTA) 62.5 MCG/INH 1 puff  1 puff Inhalation Daily Val Riles, MD   1 puff at 07/28/20 0904   And  . fluticasone furoate-vilanterol (BREO ELLIPTA) 100-25 MCG/INH 1 puff  1 puff Inhalation Daily Val Riles, MD   1 puff at 07/28/20 0903  . gabapentin (NEURONTIN) capsule 100 mg  100 mg Oral TID Val Riles, MD   100 mg at  07/28/20 0854  . guaiFENesin (MUCINEX) 12 hr tablet 600 mg  600 mg Oral BID Val Riles, MD   600 mg at 07/28/20 0855  . guaiFENesin-dextromethorphan (ROBITUSSIN DM) 100-10 MG/5ML syrup 5 mL  5 mL Oral Q6H PRN Val Riles, MD      . hydrALAZINE (APRESOLINE) injection 10 mg  10 mg Intravenous Q6H PRN Val Riles, MD      . influenza vaccine adjuvanted (FLUAD) injection 0.5 mL  0.5 mL Intramuscular Tomorrow-1000 Enzo Bi, MD      . ipratropium-albuterol (DUONEB) 0.5-2.5 (3) MG/3ML nebulizer solution 3 mL  3 mL Nebulization Q6H Enzo Bi, MD   3 mL at 07/28/20 0757  . latanoprost (XALATAN) 0.005 % ophthalmic solution 1 drop  1 drop Both Eyes QHS Val Riles, MD   1 drop at 07/27/20 2043  . ondansetron (ZOFRAN) tablet 4 mg  4 mg Oral Q6H PRN Val Riles, MD       Or  . ondansetron [facility]) injection 4 mg  4 mg Intravenous Q6H PRN Val Riles, MD      . oxyCODONE (Oxy IR/ROXICODONE) immediate release tablet 5 mg  5 mg Oral TID PRN Enzo Bi, MD   5 mg at 07/27/20 1833  . pneumococcal 23 valent vaccine (PNEUMOVAX-23) injection 0.5 mL  0.5 mL Intramuscular Tomorrow-1000 Enzo Bi, MD      . polyethylene glycol (MIRALAX / GLYCOLAX) packet 17 g  17 g Oral Daily PRN Val Riles, MD      . predniSONE (DELTASONE) tablet 20 mg  20 mg Oral Q breakfast Charlaine Dalton R, MD   20 mg at 07/28/20 1413  . protein supplement (ENSURE MAX) liquid  11 oz Oral BID Enzo Bi, MD   11 oz at 07/28/20 0902  . sodium chloride flush (NS) 0.9 % injection 3 mL  3 mL Intravenous Q12H Val Riles, MD   3 mL at 07/27/20 0906  . sodium chloride flush (NS) 0.9 % injection 3 mL  3 mL Intravenous PRN Val Riles, MD       Facility-Administered Medications Ordered in Other Encounters  Medication Dose Route Frequency Provider Last Rate Last Admin  . sodium chloride flush (NS) 0.9 % injection 10 mL  10 mL Intravenous PRN [name], MD   10 mL at 05/02/20 9767     Discharge Medications: Please see  discharge summary for a list of discharge medications.  Relevant Imaging Results:  Relevant Lab Results:   Additional Information SS# [id number]  Shelbie Ammons, RN

## 2020-07-28 NOTE — Telephone Encounter (Signed)
Brenda Rowan- I will call pt's son to discuss. Thanks GB

## 2020-07-28 NOTE — Progress Notes (Addendum)
Brenda Parker   DOB:1936/05/10   RD#:[id number]    Subjective: No acute events overnight.  Patient still coughing up sputum.  No hemoptysis.  Not been able to get out of the bed yet.  Complains of poor appetite.  Objective:  Vitals:   07/28/20 1535 07/28/20 1550  BP: 104/60   Pulse: 93   Resp: 19   Temp: 97.8 F (36.6 C)   SpO2: 99% 99%     Intake/Output Summary (Last 24 hours) at 07/28/2020 1626 Last data filed at 07/28/2020 1003 Gross per 24 hour  Intake 120 ml  Output 0 ml  Net 120 ml    Physical Exam Vitals and nursing note reviewed.  Constitutional:      Comments: Frail-appearing Caucasian female patient.  She is resting in the bed comfortably.  She is accompanied by her son.  HENT:     Head: Normocephalic and atraumatic.     Mouth/Throat:     Pharynx: No oropharyngeal exudate.  Eyes:     Pupils: Pupils are equal, round, and reactive to light.  Cardiovascular:     Rate and Rhythm: Normal rate and regular rhythm.  Pulmonary:     Effort: No respiratory distress.     Breath sounds: No wheezing.     Comments: Decreased air entry bilaterally.  Decreased crackles. Abdominal:     General: Bowel sounds are normal. There is no distension.     Palpations: Abdomen is soft. There is no mass.     Tenderness: There is no abdominal tenderness. There is no guarding or rebound.  Musculoskeletal:        General: No tenderness. Normal range of motion.     Cervical back: Normal range of motion and neck supple.  Skin:    General: Skin is warm.  Neurological:     Mental Status: She is alert and oriented to person, place, and time.  Psychiatric:        Mood and Affect: Affect normal.      Labs:  Lab Results  Component Value Date   WBC 14.7 (H) 07/28/2020   HGB 9.9 (L) 07/28/2020   HCT 32.1 (L) 07/28/2020   MCV 89.4 07/28/2020   PLT 415 (H) 07/28/2020   NEUTROABS 12.2 (H) 07/23/2020    Lab Results  Component Value Date   NA 138 07/28/2020   K 4.0 07/28/2020   CL 103  07/28/2020   CO2 26 07/28/2020    Studies:  DG Chest 2 View  Result Date: 07/28/2020 CLINICAL DATA:  History of metastatic lung cancer. EXAM: CHEST - 2 VIEW COMPARISON:  07/21/2020 FINDINGS: Stable cavitary opacity in the right upper lobe with architectural distortion and upward retraction of the hilum. Bibasilar hazy airspace disease. No pleural effusion or pneumothorax. Stable heart size. Left-sided Port-A-Cath in unchanged position. No acute osseous abnormality. IMPRESSION: Bibasilar hazy airspace disease concerning for pneumonia. Stable cavitary opacity in the right upper lobe with architectural distortion and upward retraction of the hilum consistent with malignancy Electronically Signed   By: Kathreen Devoid   On: 07/28/2020 14:11    Primary cancer of right upper lobe of lung [facility]) #84 year old female patient with metastatic/stage IV lung cancer currently on ""chemo holiday" is currently admitted hospital for worsening shortness of breath/cough; CT scan suggestive of pneumonia.  # Acute on chronic respiratory failure secondary to bilateral pneumonia-currently on cefepime.  Slight improvement noted clinically.  However patient still feeling weak/feeling poorly.  See discussion below.  Check chest x-ray; white count  trending up slightly.  However procalcitonin trending down.  # Metastatic lung cancer/stage IV-currently on chemo holiday.  September, 29th 2021-shows mildly progressive malignancy; however patient's symptoms are likely secondary to pneumonia.    #Poor appetite-secondary to underlying malignancy/pneumonia.-Recommend prednisone 20 mg a day.  #Disposition agree with skilled nursing facility given patient's overall debility/social situation [patient lives in home alone]; level plan of care was discussed with patient/son present by the bedside in detail.  Recommend.  #DNR/DNI  [name], MD 07/28/2020  4:26 PM

## 2020-07-28 NOTE — TOC Initial Note (Signed)
Transition of Care [facility]) - Initial/Assessment Note    Patient Details  Name: Brenda Parker MRN: [id number] Date of Birth: 16-Dec-1935  Transition of Care [facility]) CM/SW Contact:    Shelbie Ammons, RN Phone Number: 07/28/2020, 2:53 PM  Clinical Narrative:     RNCM met with patient and son at bedside. Patient reports to feeling somewhat better but is still very weak. Patient lives at home alone and has family that can assist her but none that can provide 24 hour care. Patient is agreeable to go to a SNF and she and her son are both ok with it being sent to all area facilities. Son requests that they would like [facility] if possible.  RNCM completed PASSR, FL-2 and started bed search.               Expected Discharge Plan: Skilled Nursing Facility Barriers to Discharge: No Barriers Identified   Patient Goals and CMS Choice     Choice offered to / list presented to : Patient  Expected Discharge Plan and Services Expected Discharge Plan: [place]   Discharge Planning Services: CM Consult Post Acute Care Choice: [place] Living arrangements for the past 2 months: [place]                                      Prior Living Arrangements/Services Living arrangements for the past 2 months: [place] Lives with:: Self Patient language and need for interpreter reviewed:: Yes Do you feel safe going back to the place where you live?: Yes      Need for Family Participation in Patient Care: Yes (Comment) Care giver support system in place?: Yes (comment)   Criminal Activity/Legal Involvement Pertinent to Current Situation/Hospitalization: No - Comment as needed  Activities of Daily Living Home Assistive Devices/Equipment: Environmental consultant (specify type), Cane (specify quad or straight), Eyeglasses, Oxygen ADL Screening (condition at time of admission) Patient's cognitive ability adequate to safely complete daily activities?: Yes Is  the patient deaf or have difficulty hearing?: No Does the patient have difficulty seeing, even when wearing glasses/contacts?: No Does the patient have difficulty concentrating, remembering, or making decisions?: No Patient able to express need for assistance with ADLs?: Yes Does the patient have difficulty dressing or bathing?: Yes Independently performs ADLs?: No Communication: Independent Dressing (OT): Appropriate for developmental age, Needs assistance Is this a change from baseline?: Change from baseline, expected to last <3days Grooming: Needs assistance Is this a change from baseline?: Change from baseline, expected to last <3 days Feeding: Independent Bathing: Needs assistance Is this a change from baseline?: Change from baseline, expected to last <3 days Toileting: Needs assistance Is this a change from baseline?: Change from baseline, expected to last <3 days In/Out Bed: Needs assistance Is this a change from baseline?: Change from baseline, expected to last <3 days Walks in Home: Needs assistance Is this a change from baseline?: Change from baseline, expected to last <3 days Does the patient have difficulty walking or climbing stairs?: Yes Weakness of Legs: Both Weakness of Arms/Hands: Both  Permission Sought/Granted                  Emotional Assessment Appearance:: Appears stated age Attitude/Demeanor/Rapport: Engaged Affect (typically observed): Appropriate, Calm Orientation: : Oriented to Self, Oriented to Place, Oriented to  Time, Oriented to Situation Alcohol / Substance Use: Never Used Psych Involvement:  No (comment)  Admission diagnosis:  Dehydration [E86.0] Pneumonia [J18.9] Primary malignant neoplasm of lung metastatic to other site, unspecified laterality ([place]) [C34.90] Multifocal pneumonia [J18.9] Sepsis with acute hypoxic respiratory failure without septic shock, due to unspecified organism ([place]) [A41.9, R65.20, J96.01] Patient Active Problem List    Diagnosis Date Noted  . Acute on chronic respiratory failure with hypoxia ([place]) 07/23/2020  . Goals of care, counseling/discussion 09/03/2019  . Malnutrition of moderate degree 07/10/2018  . Multifocal pneumonia 07/07/2018  . Pneumonia 06/28/2018  . Primary cancer of right upper lobe of lung ([place]) 03/03/2018  . Hyperlipidemia, unspecified 02/08/2018  . Hypertension 02/08/2018  . Chronic cough 08/05/2017  . Lesion of liver 07/04/2017  . Abnormal CXR 07/01/2017  . Psoriasis of scalp 12/22/2015  . Back pain with left-sided sciatica 06/16/2015  . 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 Impaired fasting glucose 12/13/2012  . Chronic kidney disease 03/28/2012  . Seborrheic dermatitis 03/28/2012   PCP:  Leonel Ramsay, MD Pharmacy:   [facility] [phone] [address], [address] [place] [address] Phone: [phone] Fax: [phone]  [facility], [place] [address] [place] [address] Phone: [phone] Fax: [phone]     Social Determinants of Health (SDOH) Interventions    Readmission Risk Interventions No flowsheet data found.

## 2020-07-28 NOTE — Care Management Important Message (Signed)
Important Message  Patient Details  Name: SARAE NICHOLES MRN: [id number] Date of Birth: [birth year]   Medicare Important Message Given:  Yes     Dannette Barbara 07/28/2020, 11:13 AM

## 2020-07-28 NOTE — Telephone Encounter (Signed)
Son called asking if instead of short term rehab, the patient should go to the Hospice Home. He states he does not feel she would live through rehab. He is asking for Dr Sharmaine Base opinion regarding this. Please return his call

## 2020-07-28 NOTE — Progress Notes (Signed)
Physical Therapy Treatment Patient Details Name: Brenda Parker MRN: [id number] DOB: 08/03/1936 Today's Date: 07/28/2020    History of Present Illness Patient is an 84 y.o. female with past medical history of metastatic lung cancer status post chemotherapy, COPD, HTN, HLD, CKD, chronic back pain presented at [facility] ED due to worsening of shortness of breath. Work up reveals acute on chronic hyper respiratory failure due to COPD and pneumonia.     PT Comments    Pt napping in bed upon arrival to room with son at bedside. Pt awoke with verbal stimuli. Pt willing to participate with increased encouragement from son and clinician. Pt on 2L O2 throughout session and remained within 90s O2 sats. Pt performed supine to sit from elevated [facility] with supervision only and required increased verbal cues for sequencing of tasks. Pt attempted to stand with min A and unable to come into full upright standing sitting quickly to the bed. Verbal cues for hand and foot placement for improved efficiency of transfer. Second attempt, pt reported needing to go to the bathroom. Mod A for transfer for boost into standing and min A for steadying while pt ambulated 3 feet to BSC. Pt voided on the floor throughout transfer. Pt stood to perform pericare requiring mod A and exhibited muscle tremors, like buckling, requiring mod A to ambulate 2 feet to recliner chair using RW. Once seated, pt performed therex for promotion of BLE strengthening. Pt required increased verbal cues for good technique. Pt fatigued post session and left sitting up in recliner chair with son present. Discharge recommendation remains appropriate at this time.   Follow Up Recommendations  SNF     Equipment Recommendations  None recommended by PT    Recommendations for Other Services       Precautions / Restrictions Precautions Precautions: Fall Precaution Comments: monitor Sp02 with activity  Restrictions Weight Bearing Restrictions: No     Mobility  Bed Mobility Overal bed mobility: Needs Assistance Bed Mobility: Supine to Sit     Supine to sit: Supervision;HOB elevated     General bed mobility comments: Pt required increased time and supervision to perform supine to sit from elevated HOB; increased verbal cues for sequencing of tasks and hand placement  Transfers Overall transfer level: Needs assistance Equipment used: Rolling walker (2 wheeled) Transfers: Sit to/from Stand Sit to Stand: Mod assist         General transfer comment: First attempt with min A, pt unable to come into full upright stance falling quickly back to bed requiring mod A for eccentric control; 2nd attempt required mod A for boost into standing and coming into full upright stance; increased verbal cues for hand and foot placement during transfer   Ambulation/Gait Ambulation/Gait assistance: Min assist;Mod assist Gait Distance (Feet): 5 Feet Assistive device: Rolling walker (2 wheeled) Gait Pattern/deviations: Narrow base of support Gait velocity: decreased   General Gait Details: Min A initially for steadying as pt ambulated 3 feet from bed to [facility] with RW; pt with narrow BOS and decreased step lengths bilaterally; final 2 feet required mod A secondary to pt fatigue   Stairs             Wheelchair Mobility    Modified Rankin (Stroke Patients Only)       Balance Overall balance assessment: Needs assistance Sitting-balance support: Bilateral upper extremity supported;Feet supported Sitting balance-Leahy Scale: Fair Sitting balance - Comments: fair balance with back unsupported while sitting edge of bed with BUE on bed  Standing balance support: Bilateral upper extremity supported;During functional activity Standing balance-Leahy Scale: Poor Standing balance comment: fair to poor balance with BUE on RW and mod A for safety                            Cognition Arousal/Alertness: Awake/alert Behavior During  Therapy: WFL for tasks assessed/performed Overall Cognitive Status: Within Functional Limits for tasks assessed                                        Exercises Other Exercises Other Exercises: pt performed LAQ with 3 second hold with knee in extension, alternating seated marches, and resisted leg presses x 10 each bilaterally    General Comments        Pertinent Vitals/Pain Pain Assessment: Faces Faces Pain Scale: Hurts little more Pain Location: back Pain Descriptors / Indicators: Discomfort Pain Intervention(s): Monitored during session;Repositioned;Patient requesting pain meds-RN notified    Home Living                      Prior Function            PT Goals (current goals can now be found in the care plan section) Acute Rehab PT Goals Patient Stated Goal: to go home  PT Goal Formulation: With patient Time For Goal Achievement: 08/10/20 Potential to Achieve Goals: Good Progress towards PT goals: Progressing toward goals    Frequency    Min 2X/week      PT Plan Current plan remains appropriate    Co-evaluation              AM-PAC PT "6 Clicks" Mobility   Outcome Measure  Help needed turning from your back to your side while in a flat bed without using bedrails?: A Little Help needed moving from lying on your back to sitting on the side of a flat bed without using bedrails?: A Lot Help needed moving to and from a bed to a chair (including a wheelchair)?: A Lot Help needed standing up from a chair using your arms (e.g., wheelchair or bedside chair)?: A Little Help needed to walk in hospital room?: A Lot Help needed climbing 3-5 steps with a railing? : A Lot 6 Click Score: 14    End of Session Equipment Utilized During Treatment: Gait belt;Oxygen;Other (comment) (2L O2 via nasal cannula) Activity Tolerance: Patient limited by fatigue Patient left: in chair;with call bell/phone within reach;with chair alarm set;with  family/visitor present;Other (comment) (son present) Nurse Communication: Mobility status PT Visit Diagnosis: Muscle weakness (generalized) (M62.81);Unsteadiness on feet (R26.81);Other abnormalities of gait and mobility (R26.89)     Time: 1431-1501 PT Time Calculation (min) (ACUTE ONLY): 30 min  Charges:                        Vale Haven, SPT   Vale Haven 07/28/2020, 3:53 PM

## 2020-07-29 DIAGNOSIS — Z515 Encounter for palliative care: Secondary | ICD-10-CM

## 2020-07-29 LAB — CBC
HCT: 32.6 % — ABNORMAL LOW (ref 36.0–46.0)
Hemoglobin: 10.6 g/dL — ABNORMAL LOW (ref 12.0–15.0)
MCH: 28.3 pg (ref 26.0–34.0)
MCHC: 32.5 g/dL (ref 30.0–36.0)
MCV: 87.2 fL (ref 80.0–100.0)
Platelets: 410 10*3/uL — ABNORMAL HIGH (ref 150–400)
RBC: 3.74 MIL/uL — ABNORMAL LOW (ref 3.87–5.11)
RDW: 17.7 % — ABNORMAL HIGH (ref 11.5–15.5)
WBC: 12.7 10*3/uL — ABNORMAL HIGH (ref 4.0–10.5)
nRBC: 0 % (ref 0.0–0.2)

## 2020-07-29 LAB — BASIC METABOLIC PANEL
Anion gap: 11 (ref 5–15)
BUN: 28 mg/dL — ABNORMAL HIGH (ref 8–23)
CO2: 25 mmol/L (ref 22–32)
Calcium: 9.5 mg/dL (ref 8.9–10.3)
Chloride: 101 mmol/L (ref 98–111)
Creatinine, Ser: 0.94 mg/dL (ref 0.44–1.00)
GFR calc Af Amer: >60 mL/min (ref >60)
GFR calc non Af Amer: 56 mL/min — ABNORMAL LOW (ref >60)
Glucose, Bld: 114 mg/dL — ABNORMAL HIGH (ref 70–99)
Potassium: 3.9 mmol/L (ref 3.5–5.1)
Sodium: 137 mmol/L (ref 135–145)

## 2020-07-29 LAB — MAGNESIUM: Magnesium: 2.3 mg/dL (ref 1.7–2.4)

## 2020-07-29 MED ORDER — POLYETHYLENE GLYCOL 3350 17 G PO PACK
34.0000 g | PACK | Freq: Two times a day (BID) | ORAL | Status: DC
  Administered 2020-07-29 – 2020-07-30 (×3): 34 g via ORAL
  Filled 2020-07-29 (×3): qty 2

## 2020-07-29 MED ORDER — IPRATROPIUM-ALBUTEROL 0.5-2.5 (3) MG/3ML IN SOLN
3.0000 mL | Freq: Three times a day (TID) | RESPIRATORY_TRACT | Status: DC
  Administered 2020-07-29 (×2): 3 mL via RESPIRATORY_TRACT
  Filled 2020-07-29 (×3): qty 3

## 2020-07-29 NOTE — TOC Progression Note (Signed)
Transition of Care [facility]) - Progression Note    Patient Details  Name: Brenda Parker MRN: [id number] Date of Birth: [birth year]  Transition of Care [facility]) CM/SW [place], RN Phone Number: 07/29/2020, 2:10 PM  Clinical Narrative:   RNCM met with patient and friend in room to discuss bed options. Patient sitting up in chair and reports that she's been up since 3 unable to sleep, other than this she reports she is feeling pretty well. Discussed that there are a few bed offers to talk about and she reports that she is not the person to talk to and wants me to call her son.   RNCM reached out to son to discuss bed options, son reports that he is waiting to hear back if patient can go to the Hospice Home. Discussed that patient will not likely qualify for the Hospice Home at this point but that they would evaluate. Discussed bed options of [facility] in [place] and [facility], son reports that he will let this CM know decision later this afternoon or tomorrow morning.     Expected Discharge Plan: [place] Barriers to Discharge: No Barriers Identified  Expected Discharge Plan and Services Expected Discharge Plan: [place]   Discharge Planning Services: CM Consult Post Acute Care Choice: [place] Living arrangements for the past 2 months: [place]                                       Social Determinants of Health (SDOH) Interventions    Readmission Risk Interventions No flowsheet data found.

## 2020-07-29 NOTE — Progress Notes (Signed)
Brenda Parker   DOB:September 26, 1936   WU#:[id number]    Subjective: No acute events overnight.  Patient sitting in a chair.  States her breathing is improved.  Coughing is improved.  Objective:  Vitals:   07/29/20 1433 07/29/20 1507  BP:  112/60  Pulse: 87 94  Resp: 16 18  Temp:  97.8 F (36.6 C)  SpO2: 97% 98%     Intake/Output Summary (Last 24 hours) at 07/29/2020 1702 Last data filed at 07/29/2020 1348 Gross per 24 hour  Intake 120 ml  Output --  Net 120 ml    Physical Exam Constitutional:      Comments: Thin cachectic appearing/frail Caucasian female patient.  Alone.  She is sitting in a chair.  HENT:     Head: Normocephalic and atraumatic.     Mouth/Throat:     Pharynx: No oropharyngeal exudate.  Eyes:     Pupils: Pupils are equal, round, and reactive to light.  Cardiovascular:     Rate and Rhythm: Normal rate and regular rhythm.  Pulmonary:     Effort: No respiratory distress.     Breath sounds: No wheezing.     Comments: Decreased air entry bilaterally bases.  Positive for crackles. Abdominal:     General: Bowel sounds are normal. There is no distension.     Palpations: Abdomen is soft. There is no mass.     Tenderness: There is no abdominal tenderness. There is no guarding or rebound.  Musculoskeletal:        General: No tenderness. Normal range of motion.     Cervical back: Normal range of motion and neck supple.  Skin:    General: Skin is warm.  Neurological:     Mental Status: She is alert and oriented to person, place, and time.  Psychiatric:        Mood and Affect: Affect normal.      Labs:  Lab Results  Component Value Date   WBC 12.7 (H) 07/29/2020   HGB 10.6 (L) 07/29/2020   HCT 32.6 (L) 07/29/2020   MCV 87.2 07/29/2020   PLT 410 (H) 07/29/2020   NEUTROABS 12.2 (H) 07/23/2020    Lab Results  Component Value Date   NA 137 07/29/2020   K 3.9 07/29/2020   CL 101 07/29/2020   CO2 25 07/29/2020    Studies:  DG Chest 2 View  Result Date:  07/28/2020 CLINICAL DATA:  History of metastatic lung cancer. EXAM: CHEST - 2 VIEW COMPARISON:  07/21/2020 FINDINGS: Stable cavitary opacity in the right upper lobe with architectural distortion and upward retraction of the hilum. Bibasilar hazy airspace disease. No pleural effusion or pneumothorax. Stable heart size. Left-sided Port-A-Cath in unchanged position. No acute osseous abnormality. IMPRESSION: Bibasilar hazy airspace disease concerning for pneumonia. Stable cavitary opacity in the right upper lobe with architectural distortion and upward retraction of the hilum consistent with malignancy Electronically Signed   By: Kathreen Devoid   On: 07/28/2020 14:11    Primary cancer of right upper lobe of lung [facility]) #84 year old female patient with metastatic/stage IV lung cancer currently on ""chemo holiday" is currently admitted hospital for worsening shortness of breath/cough; CT scan suggestive of pneumonia.  # Acute on chronic respiratory failure secondary to bilateral pneumonia-currently on cefepime.  Slight improvement noted clinically.  Recommend oral antibiotics at discharge for total of 14 days.  # Metastatic lung cancer/stage IV-currently on chemo holiday.  September, 29th 2021-shows mildly progressive malignancy; however patient's symptoms are likely secondary to pneumonia.    #  Poor appetite-secondary to underlying malignancy/pneumonia.-Recommend prednisone 20 mg a day; stable.  #DNR/DNI.  #Disposition: Patient's son is interested in having the patient sent over to hospice home as patient is not eating and drinking enough.  He is concerned about rapid decline.  However currently patient is clinically stable/improving; and is not actively dying based upon evaluation/labs.  Agree with SNF placement/with palliative care on board.  If hospice home declines patient admission; patient could be discharged to SNF with palliative care follow-up.  Discussed with [employer].  Discussed with Dr. Billie Ruddy.    [name], MD 07/29/2020  5:02 PM

## 2020-07-29 NOTE — Progress Notes (Addendum)
PROGRESS NOTE    Brenda Parker  OYD:[id number] DOB: [birth year] DOA: 07/23/2020 PCP: Leonel Ramsay, MD    Assessment & Plan:   Principal Problem:   Pneumonia Active Problems:   Hypertension   Primary cancer of right upper lobe of lung ([place])   Multifocal pneumonia   Malnutrition of moderate degree   Acute on chronic respiratory failure with hypoxia [facility])   Palliative care encounter    Brenda Parker is a 84 y.o. female with Past medical history of lung cancer status post chemotherapy, COPD, HTN, HLD, CKD presented at [facility] ED due to worsening of shortness of breath. Patient was treated as an outpatient with antibiotics for 2 days but her shortness of breath got worse and she was hypoxic. Patient has oxygen at home but she was not using it. Patient denied any chest pain or palpitations, denied any abdominal pain, no nausea vomiting or diarrhea.  # Acute on chronic hyper respiratory failure 2/2 COPD and pneumonia Patient got outpatient antibiotic treatment for 2 days but did not improve --hypoxic on presentation, placed on 4L O2 and started on vanc/cefe.  Vanc then d/c'ed after MRSA screen neg. --completed 5-day cefepime. PLAN: --continue O2 at 2L --continue scheduled DuoNeb (frequency being titrated by RT) --continue daily Incruse and Breo --scheduled chest PT with RT for mucus clearance   # Hypokalemia --monitor and replete PRN with oral potassium  # Lung cancer metastatic stage 4, status post chemotherapy. --currently on "chemo holiday" --CT chest showed worsening malignancy PLAN: --oncology Dr. Rogue Bussing following --Onc palliative care Josh Borders following --Son wants pt to go to hospice facility, if declined, then will go to SNF rehab with palliative care following  # weight loss, decreased appetite, inadequate oral intake --due to cancer, chemo.  Started on prednisone for appetite stimulation by Onc. PLAN: --supplements per nutrition consult --Ensure max  protein BID --cont prednisone 20 mg daily for appetite  # Chronic back pain --cont home oxycodone PRN (frequency increased to TID PRN from home dose of BID PRN) --cont home gabapentin  # Glaucoma --cont home eye drops  # Leukocytosis --WBC trended up after completing PNA treatment, however, procal trending down, and pt improving clinically. PLAN: --monitor WBC    DVT prophylaxis: Lovenox SQ Code Status: DNR Pt wants son to make all decisions.  Family Communication: son updated at the bedside on 10/4 by me.  Oncology and onc palliative care provider on daily communication with son.  Status is: inpatient Dispo:   The patient is from: home Anticipated d/c is to: hospice facility vs SNF rehab Anticipated d/c date is: whenever bed available Patient currently is medically stable to d/c.   Subjective and Interval History:  Pt drank the whole milk shake brought in by her visitor today.  Complained of more back pain this morning.  Reported being too weak to walk.  TOC working on finding SNF rehab for pt, however, son wanted pt to go to a hospice facility.  Pt told Onc palliative care provider that she will defer all decisions to her son.  Pt has clinically improved, so does not appear to be a candidate for hospice facility, however, son wants a formal decision from hospice facility before accepting SNF rehab.   Objective: Vitals:   07/29/20 0730 07/29/20 0812 07/29/20 1433 07/29/20 1507  BP: 124/71   112/60  Pulse: 85 86 87 94  Resp: 17 16 16 18   Temp: 98.2 F (36.8 C)   97.8 F (36.6 C)  TempSrc:      SpO2: 100% 98% 97% 98%  Weight:      Height:        Intake/Output Summary (Last 24 hours) at 07/29/2020 1635 Last data filed at 07/29/2020 1348 Gross per 24 hour  Intake 120 ml  Output --  Net 120 ml   Filed Weights   07/23/20 1750  Weight: 60.3 kg    Examination:   Constitutional: NAD, AAOx3 HEENT: conjunctivae and lids normal, EOMI CV: RRR no M,R,G. Distal  pulses +2.  No cyanosis.   RESP: breath sounds much better, no gurgling, normal respiratory effort, on 2L GI: +BS, NTND Extremities: No effusions, edema in BLE SKIN: warm, dry and intact Neuro: II - XII grossly intact.  Sensation intact Psych: Normal mood and affect.     Data Reviewed: I have personally reviewed following labs and imaging studies  CBC: Recent Labs  Lab 07/23/20 1134 07/24/20 0357 07/25/20 0423 07/26/20 0451 07/27/20 0645 07/28/20 0330 07/29/20 0331  WBC 13.3*   < > 10.8* 12.0* 13.2* 14.7* 12.7*  NEUTROABS 12.2*  --   --   --   --   --   --   HGB 9.1*   < > 9.5* 9.0* 10.0* 9.9* 10.6*  HCT 27.9*   < > 31.2* 28.3* 31.1* 32.1* 32.6*  MCV 86.1   < > 90.7 87.3 88.9 89.4 87.2  PLT 446*   < > 372 363 375 415* 410*   < > = values in this interval not displayed.   Basic Metabolic Panel: Recent Labs  Lab 07/24/20 0357 07/24/20 0357 07/25/20 0423 07/26/20 0451 07/27/20 0645 07/28/20 0330 07/29/20 0331  NA 139   < > 140 138 138 138 137  K 4.1   < > 3.8 3.4* 3.4* 4.0 3.9  CL 108   < > 106 104 103 103 101  CO2 24   < > 24 25 25 26 25   GLUCOSE 139*   < > 110* 104* 109* 104* 114*  BUN 30*   < > 29* 25* 18 22 28*  CREATININE 0.91   < > 0.85 0.82 0.85 0.87 0.94  CALCIUM 8.9   < > 9.0 8.8* 8.8* 9.1 9.5  MG 1.9   < > 2.1 1.8 2.2 2.2 2.3  PHOS 3.1  --   --   --   --   --   --    < > = values in this interval not displayed.   GFR: Estimated Creatinine Clearance: 39.8 mL/min (by C-G formula based on SCr of 0.94 mg/dL). Liver Function Tests: Recent Labs  Lab 07/23/20 1805  AST 56*  ALT 33  ALKPHOS 96  BILITOT 0.6  PROT 8.0  ALBUMIN 2.6*   No results for input(s): LIPASE, AMYLASE in the last 168 hours. No results for input(s): AMMONIA in the last 168 hours. Coagulation Profile: Recent Labs  Lab 07/23/20 1605  INR 1.0   Cardiac Enzymes: No results for input(s): CKTOTAL, CKMB, CKMBINDEX, TROPONINI in the last 168 hours. BNP (last 3 results) No results  for input(s): PROBNP in the last 8760 hours. HbA1C: No results for input(s): HGBA1C in the last 72 hours. CBG: No results for input(s): GLUCAP in the last 168 hours. Lipid Profile: No results for input(s): CHOL, HDL, LDLCALC, TRIG, CHOLHDL, LDLDIRECT in the last 72 hours. Thyroid Function Tests: No results for input(s): TSH, T4TOTAL, FREET4, T3FREE, THYROIDAB in the last 72 hours. Anemia Panel: No results for input(s): VITAMINB12, FOLATE, FERRITIN,  TIBC, IRON, RETICCTPCT in the last 72 hours. Sepsis Labs: Recent Labs  Lab 07/23/20 1805 07/23/20 2002 07/24/20 0357 07/26/20 0451 07/28/20 0330  PROCALCITON  --   --  1.93 0.96 0.52  LATICACIDVEN 1.6 1.3  --   --   --     Recent Results (from the past 240 hour(s))  Respiratory Panel by RT PCR (Flu A&B, Covid) - Nasopharyngeal Swab     Status: None   Collection Time: 07/21/20  8:25 PM   Specimen: Nasopharyngeal Swab  Result Value Ref Range Status   SARS Coronavirus 2 by RT PCR NEGATIVE NEGATIVE Final    Comment: (NOTE) SARS-CoV-2 target nucleic acids are NOT DETECTED.  The SARS-CoV-2 RNA is generally detectable in upper respiratoy specimens during the acute phase of infection. The lowest concentration of SARS-CoV-2 viral copies this assay can detect is 131 copies/mL. A negative result does not preclude SARS-Cov-2 infection and should not be used as the sole basis for treatment or other patient management decisions. A negative result may occur with  improper specimen collection/handling, submission of specimen other than nasopharyngeal swab, presence of viral mutation(s) within the areas targeted by this assay, and inadequate number of viral copies (<131 copies/mL). A negative result must be combined with clinical observations, patient history, and epidemiological information. The expected result is Negative.  Fact Sheet for Patients:  PinkCheek.be  Fact Sheet for Healthcare Providers:    GravelBags.it  This test is no t yet approved or cleared by the [place] FDA and  has been authorized for detection and/or diagnosis of SARS-CoV-2 by FDA under an Emergency Use Authorization (EUA). This EUA will remain  in effect (meaning this test can be used) for the duration of the COVID-19 declaration under Section 564(b)(1) of the Act, 21 U.S.C. section 360bbb-3(b)(1), unless the authorization is terminated or revoked sooner.     Influenza A by PCR NEGATIVE NEGATIVE Final   Influenza B by PCR NEGATIVE NEGATIVE Final    Comment: (NOTE) The Xpert Xpress SARS-CoV-2/FLU/RSV assay is intended as an aid in  the diagnosis of influenza from Nasopharyngeal swab specimens and  should not be used as a sole basis for treatment. Nasal washings and  aspirates are unacceptable for Xpert Xpress SARS-CoV-2/FLU/RSV  testing.  Fact Sheet for Patients: PinkCheek.be  Fact Sheet for Healthcare Providers: GravelBags.it  This test is not yet approved or cleared by the [place] FDA and  has been authorized for detection and/or diagnosis of SARS-CoV-2 by  FDA under an Emergency Use Authorization (EUA). This EUA will remain  in effect (meaning this test can be used) for the duration of the  Covid-19 declaration under Section 564(b)(1) of the Act, 21  U.S.C. section 360bbb-3(b)(1), unless the authorization is  terminated or revoked. Performed at [facility], [address] [place], [place] [address]   Gastrointestinal Panel by PCR , Stool     Status: None   Collection Time: 07/22/20 12:20 PM   Specimen: Stool  Result Value Ref Range Status   Campylobacter species NOT DETECTED NOT DETECTED Final   Plesimonas shigelloides NOT DETECTED NOT DETECTED Final   Salmonella species NOT DETECTED NOT DETECTED Final   Yersinia enterocolitica NOT DETECTED NOT DETECTED Final   Vibrio species NOT  DETECTED NOT DETECTED Final   Vibrio cholerae NOT DETECTED NOT DETECTED Final   Enteroaggregative E coli (EAEC) NOT DETECTED NOT DETECTED Final   Enteropathogenic E coli (EPEC) NOT DETECTED NOT DETECTED Final   Enterotoxigenic E coli (ETEC)  NOT DETECTED NOT DETECTED Final   Shiga like toxin producing E coli (STEC) NOT DETECTED NOT DETECTED Final   Shigella/Enteroinvasive E coli (EIEC) NOT DETECTED NOT DETECTED Final   Cryptosporidium NOT DETECTED NOT DETECTED Final   Cyclospora cayetanensis NOT DETECTED NOT DETECTED Final   Entamoeba histolytica NOT DETECTED NOT DETECTED Final   Giardia lamblia NOT DETECTED NOT DETECTED Final   Adenovirus F40/41 NOT DETECTED NOT DETECTED Final   Astrovirus NOT DETECTED NOT DETECTED Final   Norovirus GI/GII NOT DETECTED NOT DETECTED Final   Rotavirus A NOT DETECTED NOT DETECTED Final   Sapovirus (I, II, IV, and V) NOT DETECTED NOT DETECTED Final    Comment: Performed at [facility], [address] [place], [place] [address]  C difficile quick screen w PCR reflex     Status: None   Collection Time: 07/22/20 12:20 PM   Specimen: STOOL  Result Value Ref Range Status   C Diff antigen NEGATIVE NEGATIVE Final   C Diff toxin NEGATIVE NEGATIVE Final   C Diff interpretation No C. difficile detected.  Final    Comment: Performed at [facility], [address] [place], [place] [address]  Culture, blood (Routine x 2)     Status: None   Collection Time: 07/23/20  6:05 PM   Specimen: BLOOD  Result Value Ref Range Status   Specimen Description BLOOD BLOOD LEFT ARM  Final   Special Requests   Final    BOTTLES DRAWN AEROBIC AND ANAEROBIC Blood Culture adequate volume   Culture   Final    NO GROWTH 5 DAYS Performed at [facility], [address] [place], [place] [address]    Report Status 07/28/2020 FINAL  Final  Culture, blood (Routine x 2)     Status: None   Collection Time: 07/23/20  6:06 PM   Specimen: BLOOD  Result Value  Ref Range Status   Specimen Description BLOOD BLOOD RIGHT ARM  Final   Special Requests   Final    BOTTLES DRAWN AEROBIC AND ANAEROBIC Blood Culture adequate volume   Culture   Final    NO GROWTH 5 DAYS Performed at [facility], [address], [place], [place] [address]    Report Status 07/28/2020 FINAL  Final  Respiratory Panel by RT PCR (Flu A&B, Covid) - Nasopharyngeal Swab     Status: None   Collection Time: 07/23/20  8:02 PM   Specimen: Nasopharyngeal Swab  Result Value Ref Range Status   SARS Coronavirus 2 by RT PCR NEGATIVE NEGATIVE Final    Comment: (NOTE) SARS-CoV-2 target nucleic acids are NOT DETECTED.  The SARS-CoV-2 RNA is generally detectable in upper respiratoy specimens during the acute phase of infection. The lowest concentration of SARS-CoV-2 viral copies this assay can detect is 131 copies/mL. A negative result does not preclude SARS-Cov-2 infection and should not be used as the sole basis for treatment or other patient management decisions. A negative result may occur with  improper specimen collection/handling, submission of specimen other than nasopharyngeal swab, presence of viral mutation(s) within the areas targeted by this assay, and inadequate number of viral copies (<131 copies/mL). A negative result must be combined with clinical observations, patient history, and epidemiological information. The expected result is Negative.  Fact Sheet for Patients:  PinkCheek.be  Fact Sheet for Healthcare Providers:  GravelBags.it  This test is no t yet approved or cleared by the [place] FDA and  has been authorized for detection and/or diagnosis of  SARS-CoV-2 by FDA under an Emergency Use Authorization (EUA). This EUA will remain  in effect (meaning this test can be used) for the duration of the COVID-19 declaration under Section 564(b)(1) of the Act, 21 U.S.C. section 360bbb-3(b)(1),  unless the authorization is terminated or revoked sooner.     Influenza A by PCR NEGATIVE NEGATIVE Final   Influenza B by PCR NEGATIVE NEGATIVE Final    Comment: (NOTE) The Xpert Xpress SARS-CoV-2/FLU/RSV assay is intended as an aid in  the diagnosis of influenza from Nasopharyngeal swab specimens and  should not be used as a sole basis for treatment. Nasal washings and  aspirates are unacceptable for Xpert Xpress SARS-CoV-2/FLU/RSV  testing.  Fact Sheet for Patients: PinkCheek.be  Fact Sheet for Healthcare Providers: GravelBags.it  This test is not yet approved or cleared by the [place] FDA and  has been authorized for detection and/or diagnosis of SARS-CoV-2 by  FDA under an Emergency Use Authorization (EUA). This EUA will remain  in effect (meaning this test can be used) for the duration of the  Covid-19 declaration under Section 564(b)(1) of the Act, 21  U.S.C. section 360bbb-3(b)(1), unless the authorization is  terminated or revoked. Performed at [facility], [address] [place], [place] [address]   MRSA PCR Screening     Status: None   Collection Time: 07/24/20  4:05 PM   Specimen: Nasal Mucosa; Nasopharyngeal  Result Value Ref Range Status   MRSA by PCR NEGATIVE NEGATIVE Final    Comment:        The GeneXpert MRSA Assay (FDA approved for NASAL specimens only), is one component of a comprehensive MRSA colonization surveillance program. It is not intended to diagnose MRSA infection nor to guide or monitor treatment for MRSA infections. Performed at [facility], [address], [place], [place] [address]       Radiology Studies: DG Chest 2 View  Result Date: 07/28/2020 CLINICAL DATA:  History of metastatic lung cancer. EXAM: CHEST - 2 VIEW COMPARISON:  07/21/2020 FINDINGS: Stable cavitary opacity in the right upper lobe with architectural distortion and upward retraction  of the hilum. Bibasilar hazy airspace disease. No pleural effusion or pneumothorax. Stable heart size. Left-sided Port-A-Cath in unchanged position. No acute osseous abnormality. IMPRESSION: Bibasilar hazy airspace disease concerning for pneumonia. Stable cavitary opacity in the right upper lobe with architectural distortion and upward retraction of the hilum consistent with malignancy Electronically Signed   By: Kathreen Devoid   On: 07/28/2020 14:11     Scheduled Meds: . Chlorhexidine Gluconate Cloth  6 each Topical Daily  . cholecalciferol  2,000 Units Oral Daily  . enoxaparin (LOVENOX) injection  40 mg Subcutaneous Q24H  . umeclidinium bromide  1 puff Inhalation Daily   And  . fluticasone furoate-vilanterol  1 puff Inhalation Daily  . gabapentin  100 mg Oral TID  . guaiFENesin  600 mg Oral BID  . influenza vaccine adjuvanted  0.5 mL Intramuscular Tomorrow-1000  . ipratropium-albuterol  3 mL Nebulization TID  . latanoprost  1 drop Both Eyes QHS  . pneumococcal 23 valent vaccine  0.5 mL Intramuscular Tomorrow-1000  . polyethylene glycol  34 g Oral BID  . predniSONE  20 mg Oral Q breakfast  . Ensure Max Protein  11 oz Oral BID  . sodium chloride flush  3 mL Intravenous Q12H   Continuous Infusions: . sodium chloride 250 mL (07/25/20 2058)     LOS: 6 days     Enzo Bi, MD  [facility] If 7PM-7AM, please contact night-coverage 07/29/2020, 4:35 PM

## 2020-07-29 NOTE — Progress Notes (Signed)
[facility]) Hospital Liaison RN note:  Received request from Altha Harm, NP for family interest in [facility]. Chart reviewed and eligibility is pending.   Unfortunately, [facility] not have a bed to offer today. Family and hospital care team is aware. [facility] Liaison will follow for room availability.  Please call with any hospice related questions or concerns.  Thank you for the opportunity to participate in this patient's care.  Zandra Abts, RN [facility] Liaison [phone]

## 2020-07-29 NOTE — Consult Note (Signed)
[place]  [phone] [phone]   Name: Brenda Parker Date: 07/29/2020 MRN: [id number]  DOB: 1936/02/19  Patient Care Team: Leonel Ramsay, MD as PCP - General (Infectious Diseases) Telford Nab, RN as Registered Nurse Rogue Bussing, Elisha Headland, MD as Medical Oncologist (Medical Oncology) [name], MD as Referring Physician (Physical Medicine and Rehabilitation)    REASON FOR CONSULTATION: Brenda Parker is a 84 y.o. female with multiple medical problems including COPD, hypertension, hyperlipidemia, CKD, and stage IV lung cancer currently on chemo holiday due to poor tolerance, who was admitted to the hospital on 07/23/2020 with pneumonia.  Palliative care was consulted help address goals.  SOCIAL HISTORY:     reports that she quit smoking about 14 years ago. Her smoking use included cigarettes. She smoked 1.00 pack per day. She has never used smokeless tobacco. She reports previous alcohol use. She reports that she does not use drugs.  Patient is widowed.  She lives at home alone.  She has a son who is involved in her care.  ADVANCE DIRECTIVES:  Not on file  CODE STATUS: DNR  PAST MEDICAL HISTORY: Past Medical History:  Diagnosis Date  . Benign liver cyst   . Breast cancer ([place]) 2005   right breast  . COPD (chronic obstructive pulmonary disease) ([place])   . Dyspnea   . Glaucoma   . Hemoptysis 2019  . Hypertension    Not on medication for htn. patient denies this  . Lung mass 01/2018  . Personal history of radiation therapy     PAST SURGICAL HISTORY:  Past Surgical History:  Procedure Laterality Date  . BREAST EXCISIONAL BIOPSY Right 2005   positive, radiation  . BREAST LUMPECTOMY Right 03/2004  . CHOLECYSTECTOMY  1988  . COLONOSCOPY  2012  . ENDOBRONCHIAL ULTRASOUND N/A 02/21/2018   Procedure: ENDOBRONCHIAL ULTRASOUND;  Surgeon: Laverle Hobby, MD;  Location: [facility] ORS;  Service:  Pulmonary;  Laterality: N/A;  . EYE SURGERY Bilateral 2009,2010   cataract extractions with lens implant  . GALLBLADDER SURGERY Bilateral   . IR FLUORO GUIDE PORT INSERTION RIGHT  03/10/2018  . JOINT REPLACEMENT Left 2008   left hip replacement  . PORTACATH PLACEMENT  03/10/2018  . TONSILLECTOMY  1960    HEMATOLOGY/ONCOLOGY HISTORY:  Oncology History Overview Note  # April 2019- RUL LUNG CANCER-non-small cell; favor squamous cell; PET scan T4N0 [right upper lobe mass involving[mediastinal/blood results] vs small pleural effusion [?M1]- CARBO-TAXOL- RT;   #October 2020-CT progressive subcentimeter right-sided lung lesions;  # NOV 17th 2020- KHTXHFSF q3 W  # 24th FEB 2021- Carbo-Taxol weekly; # 5 treatments.  March 30-2021 progressive disease liver lesion; stable lung.  #February 01, 2020-gemcitabine [800 mg/m]  # Hx of Right breast ca [s/p lumpec; RT; No chemo; "pill" x5 years]  # COPD/Hx of smoking  # PALLIATIVE CARE: 12/19/2019- DELCINED  MOLECULAR TESTING/OMNISEQ: PDL-1 25% [TPS; TMB-H; No targets**]  --------------------------------------------------    DIAGNOSIS: [ April 2019]SQUAMOUS CELL LUNG CA  STAGE: IV/Recurrent ;GOALS: PALLIATIVE  CURRENT/MOST RECENT THERAPY-  gemcitabine [C] .     Primary cancer of right upper lobe of lung ([place])  03/13/2018 - 05/08/2018 Chemotherapy   The patient had dexamethasone (DECADRON) 4 MG tablet, 8 mg, Oral, Daily, 1 of 1 cycle, Start date: --, End date: -- palonosetron (ALOXI) injection 0.25 mg, 0.25 mg, Intravenous,  Once, 9 of 9 cycles Administration: 0.25 mg (03/13/2018), 0.25 mg (04/10/2018), 0.25 mg (04/17/2018), 0.25 mg (03/21/2018), 0.25  mg (04/25/2018), 0.25 mg (03/27/2018), 0.25 mg (04/03/2018), 0.25 mg (05/01/2018), 0.25 mg (05/08/2018) CARBOplatin (PARAPLATIN) 130 mg in sodium chloride 0.9 % 250 mL chemo infusion, 130 mg (100 % of original dose 131.4 mg), Intravenous,  Once, 9 of 9 cycles Dose modification:   (original dose 131.4 mg, Cycle  1) Administration: 130 mg (03/13/2018), 130 mg (04/10/2018), 130 mg (04/17/2018), 130 mg (03/21/2018), 130 mg (04/25/2018), 130 mg (03/27/2018), 130 mg (04/03/2018), 130 mg (05/01/2018), 130 mg (05/08/2018) PACLitaxel (TAXOL) 78 mg in sodium chloride 0.9 % 250 mL chemo infusion (</= 64m/m2), 45 mg/m2 = 78 mg, Intravenous,  Once, 9 of 9 cycles Administration: 78 mg (03/13/2018), 78 mg (04/10/2018), 78 mg (04/17/2018), 78 mg (03/21/2018), 78 mg (04/25/2018), 78 mg (03/27/2018), 78 mg (04/03/2018), 78 mg (05/01/2018), 78 mg (05/08/2018)  for chemotherapy treatment.    09/12/2019 - 11/14/2019 Chemotherapy   The patient had pembrolizumab (KEYTRUDA) 200 mg in sodium chloride 0.9 % 50 mL chemo infusion, 200 mg, Intravenous, Once, 4 of 6 cycles Administration: 200 mg (09/12/2019), 200 mg (10/03/2019), 200 mg (10/24/2019), 200 mg (11/14/2019)  for chemotherapy treatment.    12/19/2019 - 01/18/2020 Chemotherapy   The patient had dexamethasone (DECADRON) 4 MG tablet, 8 mg, Oral, Daily, 1 of 1 cycle, Start date: --, End date: -- palonosetron (ALOXI) injection 0.25 mg, 0.25 mg, Intravenous,  Once, 5 of 8 cycles Administration: 0.25 mg (12/19/2019), 0.25 mg (12/28/2019), 0.25 mg (01/18/2020), 0.25 mg (01/04/2020), 0.25 mg (01/11/2020) CARBOplatin (PARAPLATIN) 120 mg in sodium chloride 0.9 % 100 mL chemo infusion, 120 mg (100 % of original dose 122 mg), Intravenous,  Once, 5 of 8 cycles Dose modification:   (original dose 122 mg, Cycle 1) Administration: 120 mg (12/19/2019), 120 mg (12/28/2019), 120 mg (01/18/2020), 120 mg (01/04/2020), 120 mg (01/11/2020) PACLitaxel (TAXOL) 138 mg in sodium chloride 0.9 % 250 mL chemo infusion (</= 86mm2), 80 mg/m2 = 138 mg, Intravenous,  Once, 5 of 8 cycles Administration: 138 mg (12/19/2019), 138 mg (12/28/2019), 138 mg (01/18/2020), 138 mg (01/04/2020), 138 mg (01/11/2020)  for chemotherapy treatment.    02/01/2020 -  Chemotherapy   The patient had gemcitabine (GEMZAR) 1,400 mg in sodium chloride 0.9 % 250 mL chemo  infusion, 1,368 mg (100 % of original dose 800 mg/m2), Intravenous,  Once, 3 of 4 cycles Dose modification: 800 mg/m2 (original dose 800 mg/m2, Cycle 1, Reason: Provider Judgment) Administration: 1,400 mg (02/01/2020), 1,400 mg (02/08/2020), 1,400 mg (02/22/2020), 1,400 mg (02/29/2020), 1,400 mg (03/21/2020), 1,400 mg (03/14/2020)  for chemotherapy treatment.      ALLERGIES:  is allergic to penicillins.  MEDICATIONS:  Current Facility-Administered Medications  Medication Dose Route Frequency Provider Last Rate Last Admin  . 0.9 %  sodium chloride infusion  250 mL Intravenous PRN KuVal RilesMD 10 mL/hr at 07/25/20 2058 250 mL at 07/25/20 2058  . acetaminophen (TYLENOL) tablet 650 mg  650 mg Oral Q6H PRN KuVal RilesMD   650 mg at 07/28/20 1129   Or  . acetaminophen (TYLENOL) suppository 650 mg  650 mg Rectal Q6H PRN KuVal RilesMD      . albuterol (PROVENTIL) (2.5 MG/3ML) 0.083% nebulizer solution 2.5 mg  2.5 mg Nebulization Q4H PRN LaEnzo BiMD   2.5 mg at 07/27/20 0309  . albuterol (VENTOLIN HFA) 108 (90 Base) MCG/ACT inhaler 2 puff  2 puff Inhalation Q6H PRN KuVal RilesMD      . Chlorhexidine Gluconate Cloth 2 % PADS 6 each  6 each Topical Daily LaEnzo Bi  MD   6 each at 07/29/20 1027  . cholecalciferol (VITAMIN D3) tablet 2,000 Units  2,000 Units Oral Daily Val Riles, MD   2,000 Units at 07/29/20 0915  . enoxaparin (LOVENOX) injection 40 mg  40 mg Subcutaneous Q24H Val Riles, MD   40 mg at 07/27/20 2043  . umeclidinium bromide (INCRUSE ELLIPTA) 62.5 MCG/INH 1 puff  1 puff Inhalation Daily Val Riles, MD   1 puff at 07/29/20 0915   And  . fluticasone furoate-vilanterol (BREO ELLIPTA) 100-25 MCG/INH 1 puff  1 puff Inhalation Daily Val Riles, MD   1 puff at 07/29/20 0926  . gabapentin (NEURONTIN) capsule 100 mg  100 mg Oral TID Val Riles, MD   100 mg at 07/29/20 0915  . guaiFENesin (MUCINEX) 12 hr tablet 600 mg  600 mg Oral BID Val Riles, MD   600 mg at 07/29/20  0915  . guaiFENesin-dextromethorphan (ROBITUSSIN DM) 100-10 MG/5ML syrup 5 mL  5 mL Oral Q6H PRN Val Riles, MD      . hydrALAZINE (APRESOLINE) injection 10 mg  10 mg Intravenous Q6H PRN Val Riles, MD      . influenza vaccine adjuvanted (FLUAD) injection 0.5 mL  0.5 mL Intramuscular Tomorrow-1000 Enzo Bi, MD      . ipratropium-albuterol (DUONEB) 0.5-2.5 (3) MG/3ML nebulizer solution 3 mL  3 mL Nebulization TID Enzo Bi, MD      . latanoprost (XALATAN) 0.005 % ophthalmic solution 1 drop  1 drop Both Eyes QHS Val Riles, MD   1 drop at 07/28/20 2100  . ondansetron (ZOFRAN) tablet 4 mg  4 mg Oral Q6H PRN Val Riles, MD       Or  . ondansetron [facility]) injection 4 mg  4 mg Intravenous Q6H PRN Val Riles, MD      . oxyCODONE (Oxy IR/ROXICODONE) immediate release tablet 5 mg  5 mg Oral TID PRN Enzo Bi, MD   5 mg at 07/29/20 0926  . pneumococcal 23 valent vaccine (PNEUMOVAX-23) injection 0.5 mL  0.5 mL Intramuscular Tomorrow-1000 Enzo Bi, MD      . polyethylene glycol (MIRALAX / GLYCOLAX) packet 17 g  17 g Oral Daily PRN Val Riles, MD      . predniSONE (DELTASONE) tablet 20 mg  20 mg Oral Q breakfast [name], MD   20 mg at 07/29/20 0915  . protein supplement (ENSURE MAX) liquid  11 oz Oral BID Enzo Bi, MD   11 oz at 07/29/20 0925  . sodium chloride flush (NS) 0.9 % injection 3 mL  3 mL Intravenous Q12H Val Riles, MD   3 mL at 07/29/20 0925  . sodium chloride flush (NS) 0.9 % injection 3 mL  3 mL Intravenous PRN Val Riles, MD       Facility-Administered Medications Ordered in Other Encounters  Medication Dose Route Frequency Provider Last Rate Last Admin  . sodium chloride flush (NS) 0.9 % injection 10 mL  10 mL Intravenous PRN [name], MD   10 mL at 05/02/20 0905    VITAL SIGNS: BP 124/71 (BP Location: Right Arm)   Pulse 86   Temp 98.2 F (36.8 C)   Resp 16   Ht _0  (1.626 m)   Wt 133 lb (60.3 kg)   SpO2 98%   BMI 22.83 kg/m    Filed Weights   07/23/20 1750  Weight: 133 lb (60.3 kg)    Estimated body mass index is 22.83 kg/m as calculated from the following:  Height as of this encounter: _0  (1.626 m).   Weight as of this encounter: 133 lb (60.3 kg).  LABS: CBC:    Component Value Date/Time   WBC 12.7 (H) 07/29/2020 0331   HGB 10.6 (L) 07/29/2020 0331   HCT 32.6 (L) 07/29/2020 0331   PLT 410 (H) 07/29/2020 0331   MCV 87.2 07/29/2020 0331   NEUTROABS 12.2 (H) 07/23/2020 1134   LYMPHSABS 0.1 (L) 07/23/2020 1134   MONOABS 0.9 07/23/2020 1134   EOSABS 0.0 07/23/2020 1134   BASOSABS 0.0 07/23/2020 1134   Comprehensive Metabolic Panel:    Component Value Date/Time   NA 137 07/29/2020 0331   K 3.9 07/29/2020 0331   CL 101 07/29/2020 0331   CO2 25 07/29/2020 0331   BUN 28 (H) 07/29/2020 0331   CREATININE 0.94 07/29/2020 0331   GLUCOSE 114 (H) 07/29/2020 0331   CALCIUM 9.5 07/29/2020 0331   AST 56 (H) 07/23/2020 1805   ALT 33 07/23/2020 1805   ALKPHOS 96 07/23/2020 1805   BILITOT 0.6 07/23/2020 1805   PROT 8.0 07/23/2020 1805   ALBUMIN 2.6 (L) 07/23/2020 1805    RADIOGRAPHIC STUDIES: DG Chest 2 View  Result Date: 07/28/2020 CLINICAL DATA:  History of metastatic lung cancer. EXAM: CHEST - 2 VIEW COMPARISON:  07/21/2020 FINDINGS: Stable cavitary opacity in the right upper lobe with architectural distortion and upward retraction of the hilum. Bibasilar hazy airspace disease. No pleural effusion or pneumothorax. Stable heart size. Left-sided Port-A-Cath in unchanged position. No acute osseous abnormality. IMPRESSION: Bibasilar hazy airspace disease concerning for pneumonia. Stable cavitary opacity in the right upper lobe with architectural distortion and upward retraction of the hilum consistent with malignancy Electronically Signed   By: Kathreen Devoid   On: 07/28/2020 14:11   DG Chest 2 View  Result Date: 07/21/2020 CLINICAL DATA:  Shortness of breath, COPD and lung cancer, not currently receiving  treatments EXAM: CHEST - 2 VIEW COMPARISON:  CT 06/25/2020, radiograph 07/06/2018 FINDINGS: Increasing opacity in cavitary lesion within the right upper lobe with additional nodular opacities and architectural distortion in the aerated portions of the right lung more inferiorly. Chronic interstitial changes and bronchitic features in the otherwise clear left lung are similar to comparison and likely reflect patient's underlying COPD/emphysema better assessed on cross-sectional imaging. Some increasing hazy opacities in the lung bases could reflect some atelectatic change, early edema or infection. Left IJ approach Port-A-Cath tip terminates in the mid to lower SVC. The aorta is calcified. The remaining cardiomediastinal contours are unremarkable. No acute osseous or soft tissue abnormality. Degenerative changes are present in the imaged spine and shoulders. IMPRESSION: 1. Increasing opacity with underlying cavitary lesion within the right upper lobe as well as additional nodular opacities and architectural distortion in the aerated portions of the right lung more inferiorly. Features compatible with patient's known intrathoracic malignancy and pulmonary metastatic disease. 2. Background of coarsened interstitial and bronchitic changes compatible with history of COPD. 3. Some increasing faint, hazy opacities in the lung bases could atelectasis, early edema or infection. 4.  Aortic Atherosclerosis (ICD10-I70.0). Electronically Signed   By: Lovena Le M.D.   On: 07/21/2020 21:03   CT ANGIO CHEST PE W OR WO CONTRAST  Result Date: 07/23/2020 CLINICAL DATA:  Metastatic lung cancer, assess treatment response, possible pneumonia EXAM: CT ANGIOGRAPHY CHEST WITH CONTRAST TECHNIQUE: Multidetector CT imaging of the chest was performed using the standard protocol during bolus administration of intravenous contrast. Multiplanar CT image reconstructions and MIPs were obtained to  evaluate the vascular anatomy. CONTRAST:  81m  OMNIPAQUE IOHEXOL 350 MG/ML SOLN, <See Chart> OMNIPAQUE IOHEXOL 300 MG/ML SOLN, 628mOMNIPAQUE IOHEXOL 350 MG/ML SOLN COMPARISON:  06/25/2020 FINDINGS: Cardiovascular: Left chest port catheter. Satisfactory opacification of the left pulmonary arteries to the segmental level. No evidence of pulmonary embolism. 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 (Series 11, image 56). Normal heart size. No pericardial effusion. Aortic atherosclerosis. Extensive 3 vessel coronary artery calcifications and/or stents. Mediastinum/Nodes: No discretely enlarged mediastinal, hilar, or axillary lymph nodes. Interval increase in ill-defined left paratracheal soft tissue (series 11, image 41). Thyroid gland, trachea, and esophagus demonstrate no significant findings. Lungs/Pleura: Redemonstrated dense consolidation of the right upper lobe with a large cavitary lesion containing an air and fluid level, centered about the superior segment right lower lobe, increased in size compared to prior examination, measuring approximately 4.9 x 3.8 cm, previously 4.1 x 3.1 cm (series 6, image 51). There are multiple small right pulmonary nodules and cavitary lesions, which are similar to prior examination, an index nodule of the right lower lobe measuring 1.2 x 0.9 cm, not significantly changed (series 13, image 79). There is extensive new superimposed heterogeneous and ground-glass airspace opacity throughout the lungs. Severe diffuse bilateral bronchial wall thickening. Trace right pleural effusion. Upper Abdomen: No acute abnormality. Multiple hypodense liver lesions, incompletely imaged and incompletely characterized although not grossly changed compared to prior examination (series 11, image 104). Musculoskeletal: No chest wall abnormality. No acute or significant osseous findings. Review of the MIP images confirms the above findings. IMPRESSION: 1. Negative examination for left-sided  pulmonary embolism. The right pulmonary artery is again occluded in the distal vessels are non-opacified. 2. Redemonstrated dense consolidation of the right upper lobe with a large cavitary lesion containing an air and fluid level, increased in size compared to prior examination, measuring approximately 4.9 x 3.8 cm, previously 4.1 x 3.1 cm. Findings are consistent with worsened malignancy. 3. Multiple small right pulmonary nodules and cavitary lesions, which are similar to prior examination. 4. There is extensive new superimposed heterogeneous and ground-glass airspace opacity throughout the lungs. Severe diffuse bilateral bronchial wall thickening. Trace right pleural effusion. Findings are consistent with nonspecific infection or inflammation, including COVID pneumonia if clinically suspected. 5. Multiple hypodense liver lesions, incompletely imaged and incompletely characterized although not grossly changed compared to prior examination. 6. Coronary artery disease.  Aortic Atherosclerosis (ICD10-I70.0). Electronically Signed   By: AlEddie Candle.D.   On: 07/23/2020 15:19    PERFORMANCE STATUS (ECOG) : 3 - Symptomatic, >50% confined to bed  Review of Systems Unless otherwise noted, a complete review of systems is negative.  Physical Exam General: NAD, thin Pulmonary: Unlabored Extremities: no edema, no joint deformities Skin: no rashes Neurological: Weakness but otherwise nonfocal  IMPRESSION: I was asked to speak with patient's son regarding request for hospice IPU.  Patient seen and examined.  She was sitting in chair.  She seems comfortable appearing.  Patient appears to have only eaten a few bites of her breakfast.  I spoke with patient's son via telephone.  He reports the patient has been declining over the past couple of weeks leading up to this hospitalization.  He was with patient all through the weekend and yesterday and says that patient had extremely minimal oral intake.  He  verbalized feeling like patient is at end-of-life and wants to focus on keeping her comfortable.  There had been discussion about rehab but son does  not feel like patient is rehab appropriate.  We discussed option of hospice involvement at home but patient lives alone and son feels like she will need care in a facility.  I agreed to pass along the referral to the hospice team.  If the hospice MD does not feel that patient is hospice IPU appropriate, then the next best option may be SNF with palliative care following.  Patient could be then transferred to hospice IPU in the event of decline.  Note that she would not be eligible to receive hospice services at rehab directly given Medicare skilled days.  PLAN: -Best supportive care -Hospice home versus SNF with palliative care -DNR/DNI  Case and plan discussed with Dr. Rogue Bussing   Time Total: 30 minutes  Visit consisted of counseling and education dealing with the complex and emotionally intense issues of symptom management and palliative care in the setting of serious and potentially life-threatening illness.Greater than 50%  of this time was spent counseling and coordinating care related to the above assessment and plan.  Signed by: Altha Harm, PhD, NP-C

## 2020-07-30 DIAGNOSIS — J189 Pneumonia, unspecified organism: Secondary | ICD-10-CM | POA: Diagnosis not present

## 2020-07-30 DIAGNOSIS — C3411 Malignant neoplasm of upper lobe, right bronchus or lung: Secondary | ICD-10-CM | POA: Diagnosis not present

## 2020-07-30 LAB — BASIC METABOLIC PANEL
Anion gap: 9 (ref 5–15)
BUN: 39 mg/dL — ABNORMAL HIGH (ref 8–23)
CO2: 27 mmol/L (ref 22–32)
Calcium: 8.9 mg/dL (ref 8.9–10.3)
Chloride: 101 mmol/L (ref 98–111)
Creatinine, Ser: 1.01 mg/dL — ABNORMAL HIGH (ref 0.44–1.00)
GFR calc non Af Amer: 52 mL/min — ABNORMAL LOW (ref >60)
Glucose, Bld: 111 mg/dL — ABNORMAL HIGH (ref 70–99)
Potassium: 4.1 mmol/L (ref 3.5–5.1)
Sodium: 137 mmol/L (ref 135–145)

## 2020-07-30 LAB — CBC
HCT: 31 % — ABNORMAL LOW (ref 36.0–46.0)
Hemoglobin: 9.4 g/dL — ABNORMAL LOW (ref 12.0–15.0)
MCH: 27.3 pg (ref 26.0–34.0)
MCHC: 30.3 g/dL (ref 30.0–36.0)
MCV: 90.1 fL (ref 80.0–100.0)
Platelets: 402 10*3/uL — ABNORMAL HIGH (ref 150–400)
RBC: 3.44 MIL/uL — ABNORMAL LOW (ref 3.87–5.11)
RDW: 17.6 % — ABNORMAL HIGH (ref 11.5–15.5)
WBC: 12.8 10*3/uL — ABNORMAL HIGH (ref 4.0–10.5)
nRBC: 0 % (ref 0.0–0.2)

## 2020-07-30 LAB — SARS CORONAVIRUS 2 BY RT PCR (HOSPITAL ORDER, PERFORMED IN ~~LOC~~ HOSPITAL LAB): SARS Coronavirus 2: NEGATIVE

## 2020-07-30 LAB — MAGNESIUM: Magnesium: 2.3 mg/dL (ref 1.7–2.4)

## 2020-07-30 MED ORDER — CEFDINIR 300 MG PO CAPS: 300.0000 mg | ORAL_CAPSULE | Freq: Two times a day (BID) | ORAL | 0 refills | Status: AC

## 2020-07-30 MED ORDER — PREDNISONE 20 MG PO TABS: ORAL_TABLET | ORAL | 0 refills | Status: AC

## 2020-07-30 MED ORDER — LACTULOSE 10 GM/15ML PO SOLN
20.0000 g | Freq: Once | ORAL | Status: AC
  Administered 2020-07-30: 20 g via ORAL
  Filled 2020-07-30: qty 30

## 2020-07-30 MED ORDER — HEPARIN SOD (PORK) LOCK FLUSH 10 UNIT/ML IV SOLN
10.0000 [IU] | Freq: Once | INTRAVENOUS | Status: AC
  Administered 2020-07-30: 10 [IU]
  Filled 2020-07-30: qty 1

## 2020-07-30 MED ORDER — OXYCODONE HCL 5 MG PO TABS
5.0000 mg | ORAL_TABLET | Freq: Two times a day (BID) | ORAL | 0 refills | Status: DC | PRN
Start: 2020-07-30 — End: 2020-09-22

## 2020-07-30 NOTE — TOC Progression Note (Signed)
Transition of Care [facility]) - Progression Note    Patient Details  Name: Brenda Parker MRN: [id number] Date of Birth: [birth year]  Transition of Care [facility]) CM/SW [place], RN Phone Number: 07/30/2020, 8:58 AM  Clinical Narrative:   RNCM received phone call from patient's son [name]. They have decided to accept bed at [facility]. Son asking if patient will go today and discussed that this is a good probability. RNCM accepted bed in hub and reached out to see if they can accept today. RNCM will arrange EMS transport when patient ready.     Expected Discharge Plan: [place] Barriers to Discharge: No Barriers Identified  Expected Discharge Plan and Services Expected Discharge Plan: [place]   Discharge Planning Services: CM Consult Post Acute Care Choice: [place] Living arrangements for the past 2 months: [place]                                       Social Determinants of Health (SDOH) Interventions    Readmission Risk Interventions No flowsheet data found.

## 2020-07-30 NOTE — Progress Notes (Signed)
Report called to [facility] @ [facility], EMS called for transportation

## 2020-07-30 NOTE — Progress Notes (Signed)
Brenda Parker   DOB:09-Jan-1936   PI#:[id number]    Subjective: No acute events overnight.  No nausea vomiting.  Mild cough not any worse.  Shortness of breath and exertion not any worse.  Objective:  Vitals:   07/30/20 1532 07/30/20 1532  BP: 123/74 123/74  Pulse: 92 92  Resp: 17 17  Temp: 97.8 F (36.6 C) 97.8 F (36.6 C)  SpO2: 98% 98%     Intake/Output Summary (Last 24 hours) at 07/30/2020 1633 Last data filed at 07/30/2020 1300 Gross per 24 hour  Intake 480 ml  Output --  Net 480 ml    Physical Exam Vitals and nursing note reviewed.  Constitutional:      Comments: Frail-appearing Caucasian female patient.  She is alone.  The bed.  HENT:     Head: Normocephalic and atraumatic.     Mouth/Throat:     Pharynx: No oropharyngeal exudate.  Eyes:     Pupils: Pupils are equal, round, and reactive to light.  Cardiovascular:     Rate and Rhythm: Normal rate and regular rhythm.  Pulmonary:     Effort: No respiratory distress.     Breath sounds: No wheezing.     Comments: Bilateral basilar crackles; coarse breath sounds overall improved. Abdominal:     General: Bowel sounds are normal. There is no distension.     Palpations: Abdomen is soft. There is no mass.     Tenderness: There is no abdominal tenderness. There is no guarding or rebound.  Musculoskeletal:        General: No tenderness. Normal range of motion.     Cervical back: Normal range of motion and neck supple.  Skin:    General: Skin is warm.  Neurological:     Mental Status: She is alert and oriented to person, place, and time.  Psychiatric:        Mood and Affect: Affect normal.      Labs:  Lab Results  Component Value Date   WBC 12.8 (H) 07/30/2020   HGB 9.4 (L) 07/30/2020   HCT 31.0 (L) 07/30/2020   MCV 90.1 07/30/2020   PLT 402 (H) 07/30/2020   NEUTROABS 12.2 (H) 07/23/2020    Lab Results  Component Value Date   NA 137 07/30/2020   K 4.1 07/30/2020   CL 101 07/30/2020   CO2 27 07/30/2020     Studies:  No results found.  Primary cancer of right upper lobe of lung [facility]) #84 year old female patient with metastatic/stage IV lung cancer currently on ""chemo holiday" is currently admitted hospital for worsening shortness of breath/cough; CT scan suggestive of pneumonia.  # Acute on chronic respiratory failure secondary to bilateral pneumonia-currently on cefepime.  Clinical improvement noted.  Recommend total of 14 days of antibiotics.  # Metastatic lung cancer/stage IV-currently on chemo holiday.  September, 29th 2021-shows mildly progressive malignancy; however patient's symptoms are likely secondary to pneumonia.    #Poor appetite-secondary to underlying malignancy/pneumonia.-Continue prednisone for now.  #DNR/DNI.  #Disposition: Multiple discussion with patient/son regarding disposition hospice home versus SNF with physical therapy.  Since patient is clinically improved I would agree with recommendation for SNF physical therapy.  Palliative care to follow closely at SNF.  Discussed with the patient's son; and is in agreement.  Son updated that we will cancel appointment in the clinic for now.  Discussed with [employer].    [name], MD 07/30/2020  4:33 PM

## 2020-07-30 NOTE — Progress Notes (Signed)
[facility] Liaison note: New referral for [facility] Palliative to follow at [facility] received from [facility]. Patient information given to referral. Plan is for discharge tp [facility] today. Thank you for this referral. Flo Shanks BSN, RN, [place] [phone]

## 2020-07-30 NOTE — Plan of Care (Signed)
  Problem: Pain Managment: Goal: General experience of comfort will improve Outcome: Progressing   

## 2020-07-30 NOTE — Progress Notes (Signed)
EMS here to transport pt. Pt d/c on 2L oxygen. Per family friend at bedside , she has notified son.

## 2020-07-30 NOTE — Discharge Summary (Addendum)
Physician Discharge Summary  Patient ID: Brenda Parker MRN: [id number] DOB/AGE: [birth year] 82 y.o.  Admit date: 07/23/2020 Discharge date: 07/30/2020  Admission Diagnoses:  Discharge Diagnoses:  Principal Problem:   Pneumonia Active Problems:   Hypertension   Primary cancer of right upper lobe of lung ([place])   Multifocal pneumonia   Malnutrition of moderate degree   Acute on chronic respiratory failure with hypoxia [facility])   Palliative care encounter   Discharged Condition: fair  Hospital Course:  Brenda Parker a 84 y.o.femalewith Past medical history oflung cancer status post chemotherapy, COPD, HTN, HLD, CKD presented at [facility] ED due to worsening of shortness of breath. Patient was treated as an outpatient with antibiotics for 2 days but her shortness of breath got worse and she was hypoxic. Patient has oxygen at home but she was not using it. Patient denied any chest pain or palpitations, denied any abdominal pain, no nausea vomiting or diarrhea.  #1.  Acute on chronic hypoxemic respiratory failure secondary to pneumonia and COPD. Condition had improved, patient currently on 2 L oxygen.  #2.  Multifocal pneumonia. I reviewed the patient's CT scan images, there are significant metastatic cancers.  Very hard to identify pneumonia versus cancer.  Patient also has a cavitary lesion which is presumed to be cancer.  She has completed 5 days of IV antibiotics with cefepime.  I offered to treat a little longer with additional 5 days of cefdinir.  #3.  COPD. No bronchospasm.  4.  Stage IV lung cancer.  Status post chemotherapy. Continue palliative care.  5.  Anorexia with weight loss. Patient be continued followed by oncology.  Continue steroids.  I prescribed for 6 days, may continue if needed.  6.  Chronic back pain. Continue pain medicine as needed.  7.  Hypokalemia. Normalized.  8. Sepsis ruled out.    Consults: None  Significant Diagnostic Studies:  CT ANGIOGRAPHY  CHEST WITH CONTRAST  TECHNIQUE: Multidetector CT imaging of the chest was performed using the standard protocol during bolus administration of intravenous contrast. Multiplanar CT image reconstructions and MIPs were obtained to evaluate the vascular anatomy.  CONTRAST:  36mL OMNIPAQUE IOHEXOL 350 MG/ML SOLN, <See Chart> OMNIPAQUE IOHEXOL 300 MG/ML SOLN, 33mL OMNIPAQUE IOHEXOL 350 MG/ML SOLN  COMPARISON:  06/25/2020  FINDINGS: Cardiovascular: Left chest port catheter. Satisfactory opacification of the left pulmonary arteries to the segmental level. No evidence of pulmonary embolism. 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 (Series 11, image 56). Normal heart size. No pericardial effusion. Aortic atherosclerosis. Extensive 3 vessel coronary artery calcifications and/or stents.  Mediastinum/Nodes: No discretely enlarged mediastinal, hilar, or axillary lymph nodes. Interval increase in ill-defined left paratracheal soft tissue (series 11, image 41). Thyroid gland, trachea, and esophagus demonstrate no significant findings.  Lungs/Pleura: Redemonstrated dense consolidation of the right upper lobe with a large cavitary lesion containing an air and fluid level, centered about the superior segment right lower lobe, increased in size compared to prior examination, measuring approximately 4.9 x 3.8 cm, previously 4.1 x 3.1 cm (series 6, image 51). There are multiple small right pulmonary nodules and cavitary lesions, which are similar to prior examination, an index nodule of the right lower lobe measuring 1.2 x 0.9 cm, not significantly changed (series 13, image 79). There is extensive new superimposed heterogeneous and ground-glass airspace opacity throughout the lungs. Severe diffuse bilateral bronchial wall thickening. Trace right pleural effusion.  Upper Abdomen: No acute abnormality. Multiple hypodense liver lesions,  incompletely imaged and incompletely characterized although not grossly changed compared to prior examination (series 11, image 104).  Musculoskeletal: No chest wall abnormality. No acute or significant osseous findings.  Review of the MIP images confirms the above findings.  IMPRESSION: 1. Negative examination for left-sided pulmonary embolism. The right pulmonary artery is again occluded in the distal vessels are non-opacified. 2. Redemonstrated dense consolidation of the right upper lobe with a large cavitary lesion containing an air and fluid level, increased in size compared to prior examination, measuring approximately 4.9 x 3.8 cm, previously 4.1 x 3.1 cm. Findings are consistent with worsened malignancy. 3. Multiple small right pulmonary nodules and cavitary lesions, which are similar to prior examination. 4. There is extensive new superimposed heterogeneous and ground-glass airspace opacity throughout the lungs. Severe diffuse bilateral bronchial wall thickening. Trace right pleural effusion. Findings are consistent with nonspecific infection or inflammation, including COVID pneumonia if clinically suspected. 5. Multiple hypodense liver lesions, incompletely imaged and incompletely characterized although not grossly changed compared to prior examination. 6. Coronary artery disease.  Aortic Atherosclerosis (ICD10-I70.0).   Electronically Signed   By: Eddie Candle M.D.   On: 07/23/2020 15:19     Treatments: Antibiotics with cefepime and vancomycin.  Discharge Exam: Blood pressure 135/68, pulse 81, temperature 97.6 F (36.4 C), temperature source Oral, resp. rate 15, height 5\' 4"  (1.626 m), weight 60.3 kg, SpO2 100 %. General appearance: alert, cooperative and Orientated to people and place. Resp: clear to auscultation bilaterally Cardio: regular rate and rhythm, S1, S2 normal, no murmur, click, rub or gallop GI: soft, non-tender; bowel sounds normal; no  masses,  no organomegaly Extremities: extremities normal, atraumatic, no cyanosis or edema  Disposition: Discharge disposition: 03-Skilled Nursing Facility       Discharge Instructions    Diet - low sodium heart healthy   Complete by: As directed    Increase activity slowly   Complete by: As directed      Allergies as of 07/30/2020      Reactions   Penicillins Rash   Patient had a rash after an injection in 1973.  Unsure if taken orally will cause any reaction  Has patient had a PCN reaction causing immediate rash, facial/tongue/throat swelling, SOB or lightheadedness with hypotension: Yes Has patient had a PCN reaction causing severe rash involving mucus membranes or skin necrosis: No Has patient had a PCN reaction that required hospitalization: No Has patient had a PCN reaction occurring within the last 10 years: No      Medication List    STOP taking these medications   azithromycin 250 MG tablet Commonly known as: Zithromax     TAKE these medications   cefdinir 300 MG capsule Commonly known as: OMNICEF Take 1 capsule (300 mg total) by mouth 2 (two) times daily for 5 days.   fluocinolone 0.01 % external solution Commonly known as: SYNALAR Apply topically 2 (two) times daily.   gabapentin 100 MG capsule Commonly known as: NEURONTIN Take 100 mg by mouth 3 (three) times daily.   latanoprost 0.005 % ophthalmic solution Commonly known as: XALATAN Place 1 drop into both eyes at bedtime.   ondansetron 8 MG tablet Commonly known as: ZOFRAN Take 8 mg by mouth every 8 (eight) hours as needed for nausea or vomiting.   oxyCODONE 5 MG immediate release tablet Commonly known as: Oxy IR/ROXICODONE Take 1 tablet (5 mg total) by mouth 2 (two) times daily as needed for moderate pain or severe pain.   predniSONE 20  MG tablet Commonly known as: DELTASONE Take 1 tablet (20 mg total) by mouth daily with breakfast for 3 days, THEN 0.5 tablets (10 mg total) daily with breakfast  for 3 days. Start taking on: July 30, 2020 What changed: See the new instructions.   Proventil HFA 108 (90 Base) MCG/ACT inhaler Generic drug: albuterol Inhale 2 puffs into the lungs every 6 (six) hours as needed for wheezing or shortness of breath.   Trelegy Ellipta 100-62.5-25 MCG/INH Aepb Generic drug: Fluticasone-Umeclidin-Vilant Take 1 puff by mouth daily.   Vitamin D3 25 MCG (1000 UT) Caps Take 2,000 Units by mouth daily.       Contact information for follow-up providers    Leonel Ramsay, MD Follow up in 1 week(s).   Specialty: Infectious Diseases Contact information: [address] [place] [address] [phone]            Contact information for after-discharge care    Destination    [facility] Preferred SNF .   Service: Skilled Nursing Contact information: [address] [place] [phone]                 More than 35 minutes Signed: Sharen Hones 07/30/2020, 9:58 AM

## 2020-08-01 ENCOUNTER — Other Ambulatory Visit: Payer: Medicare Other

## 2020-08-01 ENCOUNTER — Ambulatory Visit: Payer: Medicare Other | Admitting: Internal Medicine

## 2020-08-18 ENCOUNTER — Telehealth: Payer: Self-pay | Admitting: Internal Medicine

## 2020-08-18 NOTE — Telephone Encounter (Signed)
Pt is requesting a call from a Clinical Team member to discuss if he needs to bring his mother in for reevaluation. She was released from [facility] short term stay and her son [name] has some concerns. Please call [name] to discuss.

## 2020-08-19 ENCOUNTER — Other Ambulatory Visit: Payer: Self-pay

## 2020-08-19 ENCOUNTER — Other Ambulatory Visit: Payer: Self-pay | Admitting: Primary Care

## 2020-08-19 ENCOUNTER — Telehealth: Payer: Self-pay | Admitting: Primary Care

## 2020-08-19 DIAGNOSIS — C3411 Malignant neoplasm of upper lobe, right bronchus or lung: Secondary | ICD-10-CM

## 2020-08-19 DIAGNOSIS — Z515 Encounter for palliative care: Secondary | ICD-10-CM

## 2020-08-19 NOTE — Progress Notes (Signed)
[facility] Consult Note Telephone: [phone]  Fax: [phone]  PATIENT NAME: Brenda Parker [address] Brenda Parker [facility] [address] [phone] (home)  DOB: 1935-10-31 MRN: [id number]  PRIMARY CARE PROVIDER:    Leonel Ramsay, MD,  [place] [address] [phone]  REFERRING PROVIDER:   Leonel Ramsay, MD [place],  [place] [address] [phone]  RESPONSIBLE PARTY:   Extended Emergency Contact Information Primary Emergency Contact: Parker,Brenda  [place] Mobile Phone: [phone] Relation: Son  I met face to face with patient in her  Home. I later spoke by phone with her son Brenda Parker.   ASSESSMENT AND RECOMMENDATIONS:   1. Advance Care Planning/Goals of Care: Goals include to maximize quality of life and symptom management. Our advance care planning conversation included a discussion about:     Experiences with loved ones who have been seriously ill or have died   Exploration of personal, cultural or spiritual beliefs that might influence medical decisions   Exploration of goals of care in the event of a sudden injury or illness   Identification  of a healthcare agent   Decision to de-escalate disease focused treatments due to poor prognosis.  Documents not prepared today but son voiced goals of managing symptoms and supportive care given a disease no longer mitigated.   2. Symptom Management:    Dyspnea: Has oxygen per concentrator but endorses safety issues with the cord as tether. She has an inogen which we got out and started to charge so she can use. Very dyspnic after ambulating in home without oxygen, sat = 86% on room air after walking 50-70 feet, and 93% on room air at rest. Reviewed use of inhalers, trellegy and albuterol, as well as other po meds.   Pain: Denies, has oxycodone and takes occasionally.   Mentation: Forgetful, writing down  notes but endorses she is aware of inability to perform some cognitive functions. Monitor for delirium vs dementia.  Caregiver: Has lived alone, son is coming by and helping with bringing in meals. Does not have caregiver but friends take her to the store. Will  need a plan for debility progression.  3. Follow up Palliative Care Visit: Palliative care will continue to follow for goals of care clarification and symptom management. Return 4 weeks or prn.  4. Family /Caregiver/Community Supports: Only son is Brenda Parker and is POA. Husband died 6 years ago on hospice. Lives in own home. Has supportive friends.  5. Cognitive / Functional decline: Son endorses memory loss recent, likely delirium of recent disease. May need cueing altho she keeps notes. She is doing adls with min assist although it takes some time and she need cueing. Eating < 50%, has nutritional supplements.Uses walker in home for stability.  I spent 75 minutes providing this consultation,  from 1500 to 1615. More than 50% of the time in this consultation was spent coordinating communication.   CHIEF COMPLAINT: dyspnea, self care deficits  HISTORY OF PRESENT ILLNESS:  Brenda Parker is a 84 y.o. year old female  with recent PNA, dyspnea, new oxygen dependence, debility . We are asked to consult around recent hospital stay, declining endurance and goals of care.    Palliative Care was asked to follow this patient by consultation request of Brenda Ramsay, MD to help address advance care planning and goals of care. This is the initial visit.  CODE STATUS: TBD  PPS: 60%  HOSPICE  ELIGIBILITY/DIAGNOSIS: TBD  ROS  General: NAD EYES: denies vision changes ENMT: denies dysphagia Cardiovascular: denies chest pain Pulmonary: denies  cough, denies increased SOB, using oxygen prn Abdomen: endorses fair appetite,  endorses continence of bowel GU: denies dysuria, endorses continence of urine MSK:  endorses ROM limitations, no falls  reported Skin: denies rashes or wounds Neurological: endorses weakness, endorses pain, denies insomnia Psych: Endorses positive mood, endorses forgetfulness  Physical Exam: Current and past weights: 139 lb today Constitutional: NAD General :frail appearing, WNWD EYES: anicteric sclera,EOMI, lids intact, no discharge  ENMT: intact hearing,oral mucous membranes moist CV: S1S2, RRR, no LE edema, rate 100 after walking Pulmonary: LCTA, no increased work of breathing, no cough, no audible wheezes, room air, has oxygen in home, 93% at rest Abdomen: intake 50-75%, no ascites GU: deferred MSK: mild sacropenia, decreased ROM in all extremities, ambulatory with standard walker Skin: warm and dry, no rashes or wounds on visible skin Neuro: Weakness, cognitive impairment possible delirium, grossly non -focal Psych: non -anxious affect, A and O x 2-3    CURRENT PROBLEM LIST:  Patient Active Problem List   Diagnosis Date Noted  . Palliative care encounter   . Acute on chronic respiratory failure with hypoxia ([place]) 07/23/2020  . Goals of care, counseling/discussion 09/03/2019  . Malnutrition of moderate degree 07/10/2018  . Multifocal pneumonia 07/07/2018  . Pneumonia 06/28/2018  . Primary cancer of right upper lobe of lung ([place]) 03/03/2018  . Hyperlipidemia, unspecified 02/08/2018  . Hypertension 02/08/2018  . Chronic cough 08/05/2017  . Lesion of liver 07/04/2017  . Abnormal CXR 07/01/2017  . Psoriasis of scalp 12/22/2015  . Back pain with left-sided sciatica 06/16/2015  . 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 Impaired fasting glucose 12/13/2012  . Chronic kidney disease 03/28/2012  . Seborrheic dermatitis 03/28/2012   PAST MEDICAL HISTORY:  Past Medical History:  Diagnosis Date  . Benign liver cyst   . Breast cancer ([place]) 2005   right breast  . COPD (chronic obstructive pulmonary disease) ([place])   . Dyspnea   . Glaucoma   . Hemoptysis 2019  . Hypertension    Not on medication for  htn. patient denies this  . Lung mass 01/2018  . Personal history of radiation therapy     SOCIAL HX:  Social History   Tobacco Use  . Smoking status: Former Smoker    Packs/day: 1.00    Types: Cigarettes    Quit date: 10/25/2005    Years since quitting: 14.8  . Smokeless tobacco: Never Used  Substance Use Topics  . Alcohol use: Not Currently    Comment: occasional glass of beer   FAMILY HX:  Family History  Problem Relation Age of Onset  . Breast cancer Mother 86  . CAD Father     ALLERGIES:  Allergies  Allergen Reactions  . Penicillins Rash    Patient had a rash after an injection in 1973.  Unsure if taken orally will cause any reaction  Has patient had a PCN reaction causing immediate rash, facial/tongue/throat swelling, SOB or lightheadedness with hypotension: Yes Has patient had a PCN reaction causing severe rash involving mucus membranes or skin necrosis: No Has patient had a PCN reaction that required hospitalization: No Has patient had a PCN reaction occurring within the last 10 years: No      PERTINENT MEDICATIONS:  Outpatient Encounter Medications as of 08/19/2020  Medication Sig  . albuterol (PROVENTIL HFA) 108 (90 Base) MCG/ACT inhaler Inhale 2 puffs into the lungs every  6 (six) hours as needed for wheezing or shortness of breath.   . Cholecalciferol (VITAMIN D3) 1000 units CAPS Take 2,000 Units by mouth daily.   . fluocinolone (SYNALAR) 0.01 % external solution Apply topically 2 (two) times daily.  Marland Kitchen gabapentin (NEURONTIN) 100 MG capsule Take 100 mg by mouth 3 (three) times daily.   Marland Kitchen latanoprost (XALATAN) 0.005 % ophthalmic solution Place 1 drop into both eyes at bedtime.   . ondansetron (ZOFRAN) 8 MG tablet Take 8 mg by mouth every 8 (eight) hours as needed for nausea or vomiting.  Marland Kitchen oxyCODONE (OXY IR/ROXICODONE) 5 MG immediate release tablet Take 1 tablet (5 mg total) by mouth 2 (two) times daily as needed for moderate pain or severe pain.  . TRELEGY  ELLIPTA 100-62.5-25 MCG/INH AEPB Take 1 puff by mouth daily.    Facility-Administered Encounter Medications as of 08/19/2020  Medication  . sodium chloride flush (NS) 0.9 % injection 10 mL     Jason Coop, NP , DNP, MPH, AGPCNP-BC, ACHPN  COVID-19 PATIENT SCREENING TOOL  Person answering questions: ____________mary______ _____   1.  Is the patient or any family member in the home showing any signs or symptoms regarding respiratory infection?               Person with Symptom- __________NA_________________  a. Fever                                                                          Yes___ No___          ___________________  b. Shortness of breath                                                    Yes___ No___          ___________________ c. Cough/congestion                                       Yes___  No___         ___________________ d. Body aches/pains                                                         Yes___ No___        ____________________ e. Gastrointestinal symptoms (diarrhea, nausea)           Yes___ No___        ____________________  2. Within the past 14 days, has anyone living in the home had any contact with someone with or under investigation for COVID-19?    Yes___ No_X_   Person __________________

## 2020-08-19 NOTE — Telephone Encounter (Signed)
Spoke with patient's son, Brenda Parker regarding Palliative referral and he was in agreement with scheduling visit.  I have scheduled an In-person Consult for 08/19/20 @ 3 PM.

## 2020-08-20 ENCOUNTER — Telehealth: Payer: Self-pay | Admitting: Hospice and Palliative Medicine

## 2020-08-20 NOTE — Telephone Encounter (Signed)
I spoke with patient's son by phone.  Patient is now home from SNF and functionally improving.  She is mostly able to care for herself.  Son was planning to hire some sitters but canceled that given her improvement.  However, son describes some periods of confusion.  No recent changes in medications.  He says that the SNF did a delirium work-up which was essentially negative.  We talked about hospice involvement but son wants to hold on that for now.  He is interested in having a follow-up appointment with med oncology and PCP at some point in the near future.  I also spoke with Ralene Bathe, NP who saw patient in the home yesterday.

## 2020-09-03 ENCOUNTER — Telehealth: Payer: Self-pay | Admitting: *Deleted

## 2020-09-03 DIAGNOSIS — R059 Cough, unspecified: Secondary | ICD-10-CM

## 2020-09-03 DIAGNOSIS — C3411 Malignant neoplasm of upper lobe, right bronchus or lung: Secondary | ICD-10-CM

## 2020-09-03 NOTE — Telephone Encounter (Signed)
Son called reporting that patient has a wet cough like she dis in past. She is taking Mucinex which she was told to take but it is not helping. Patient wants to know what is going on and why she has this cough. She is back home from rehab now and is s/p pneumonia. He does not report any fever associated with cough or change in breathing. Please advise

## 2020-09-03 NOTE — Telephone Encounter (Signed)
[name] please check with son if he wants to have pt seen in clinic for an evaluation [I would prefer- [facility]; cbc/cmp/ CXR etc]. Or else we can prescribe Tussinex.  Thanks GB

## 2020-09-03 NOTE — Telephone Encounter (Signed)
4-RN Spoke with patient's son [name]. Son declines apt in [facility] today due to his work schedule. Offered prescription for Tussinex, but pt's son declined and stated that she is taking mucinex. He would like to hold off on having any new prescriptions sent to pharmacy today. "I would like to address this tomorrow. If there is a need for Tussinex, we will determine this tomorrow." apts given to patient's son for 11/11. Son instructed to have patient obtain a chest xray prior to the apt.

## 2020-09-04 ENCOUNTER — Inpatient Hospital Stay: Payer: Medicare Other | Attending: Internal Medicine

## 2020-09-04 ENCOUNTER — Ambulatory Visit
Admission: RE | Admit: 2020-09-04 | Discharge: 2020-09-04 | Disposition: A | Discharge status: Home / Self Care | Payer: Medicare Other | Attending: Internal Medicine | Admitting: Internal Medicine

## 2020-09-04 ENCOUNTER — Inpatient Hospital Stay (HOSPITAL_BASED_OUTPATIENT_CLINIC_OR_DEPARTMENT_OTHER): Payer: Medicare Other | Admitting: Hospice and Palliative Medicine

## 2020-09-04 ENCOUNTER — Ambulatory Visit
Admission: RE | Admit: 2020-09-04 | Discharge: 2020-09-04 | Disposition: A | Discharge status: Home / Self Care | Payer: Medicare Other | Source: Ambulatory Visit | Attending: Internal Medicine | Admitting: Internal Medicine

## 2020-09-04 ENCOUNTER — Inpatient Hospital Stay: Payer: Medicare Other | Admitting: Nurse Practitioner

## 2020-09-04 ENCOUNTER — Other Ambulatory Visit: Payer: Self-pay

## 2020-09-04 VITALS — BP 117/64 | HR 88 | Temp 95.8°F | Resp 22 | Ht 64.0 in | Wt 130.4 lb

## 2020-09-04 DIAGNOSIS — Z515 Encounter for palliative care: Secondary | ICD-10-CM | POA: Diagnosis not present

## 2020-09-04 DIAGNOSIS — R0602 Shortness of breath: Secondary | ICD-10-CM

## 2020-09-04 DIAGNOSIS — C3411 Malignant neoplasm of upper lobe, right bronchus or lung: Secondary | ICD-10-CM

## 2020-09-04 DIAGNOSIS — J189 Pneumonia, unspecified organism: Secondary | ICD-10-CM | POA: Diagnosis not present

## 2020-09-04 DIAGNOSIS — R059 Cough, unspecified: Secondary | ICD-10-CM

## 2020-09-04 DIAGNOSIS — R9389 Abnormal findings on diagnostic imaging of other specified body structures: Secondary | ICD-10-CM

## 2020-09-04 DIAGNOSIS — Z9221 Personal history of antineoplastic chemotherapy: Secondary | ICD-10-CM | POA: Insufficient documentation

## 2020-09-04 LAB — CBC WITH DIFFERENTIAL/PLATELET
Abs Immature Granulocytes: 0.06 10*3/uL (ref 0.00–0.07)
Basophils Absolute: 0 10*3/uL (ref 0.0–0.1)
Basophils Relative: 0 %
Eosinophils Absolute: 0.1 10*3/uL (ref 0.0–0.5)
Eosinophils Relative: 1 %
HCT: 31.8 % — ABNORMAL LOW (ref 36.0–46.0)
Hemoglobin: 9.8 g/dL — ABNORMAL LOW (ref 12.0–15.0)
Immature Granulocytes: 1 %
Lymphocytes Relative: 7 %
Lymphs Abs: 0.8 10*3/uL (ref 0.7–4.0)
MCH: 27.6 pg (ref 26.0–34.0)
MCHC: 30.8 g/dL (ref 30.0–36.0)
MCV: 89.6 fL (ref 80.0–100.0)
Monocytes Absolute: 1 10*3/uL (ref 0.1–1.0)
Monocytes Relative: 9 %
Neutro Abs: 9.1 10*3/uL — ABNORMAL HIGH (ref 1.7–7.7)
Neutrophils Relative %: 82 %
Platelets: 494 10*3/uL — ABNORMAL HIGH (ref 150–400)
RBC: 3.55 MIL/uL — ABNORMAL LOW (ref 3.87–5.11)
RDW: 16.1 % — ABNORMAL HIGH (ref 11.5–15.5)
WBC: 11.1 10*3/uL — ABNORMAL HIGH (ref 4.0–10.5)
nRBC: 0 % (ref 0.0–0.2)

## 2020-09-04 LAB — COMPREHENSIVE METABOLIC PANEL
ALT: 8 U/L (ref 0–44)
AST: 15 U/L (ref 15–41)
Albumin: 2.8 g/dL — ABNORMAL LOW (ref 3.5–5.0)
Alkaline Phosphatase: 85 U/L (ref 38–126)
Anion gap: 10 (ref 5–15)
BUN: 23 mg/dL (ref 8–23)
CO2: 26 mmol/L (ref 22–32)
Calcium: 9.3 mg/dL (ref 8.9–10.3)
Chloride: 104 mmol/L (ref 98–111)
Creatinine, Ser: 1.04 mg/dL — ABNORMAL HIGH (ref 0.44–1.00)
GFR, Estimated: 54 mL/min — ABNORMAL LOW (ref >60)
Glucose, Bld: 104 mg/dL — ABNORMAL HIGH (ref 70–99)
Potassium: 3.6 mmol/L (ref 3.5–5.1)
Sodium: 140 mmol/L (ref 135–145)
Total Bilirubin: 0.5 mg/dL (ref 0.3–1.2)
Total Protein: 8.7 g/dL — ABNORMAL HIGH (ref 6.5–8.1)

## 2020-09-04 MED ORDER — AZITHROMYCIN 250 MG PO TABS: ORAL_TABLET | ORAL | 0 refills | Status: AC

## 2020-09-04 MED ORDER — BENZONATATE 100 MG PO CAPS: 100.0000 mg | ORAL_CAPSULE | Freq: Three times a day (TID) | ORAL | 0 refills | Status: AC | PRN

## 2020-09-04 MED ORDER — PREDNISONE 10 MG PO TABS: ORAL_TABLET | ORAL | 0 refills | Status: AC

## 2020-09-04 NOTE — Progress Notes (Signed)
Symptom Management [place]  [phone] [phone]  Patient Care Team: Leonel Ramsay, MD as PCP - General (Infectious Diseases) Telford Nab, RN as Registered Nurse [name], MD as Medical Oncologist (Medical Oncology) [name], MD as Referring Physician (Physical Medicine and Rehabilitation) Jason Coop, NP as Nurse Practitioner Ercel Normoyle, Kirt Boys, NP as Nurse Practitioner [facility] and Palliative Medicine)   Name of the patient: Brenda Parker  [id number]  1935-12-09   Date of visit: 09/04/20  Reason for Consult:  Brenda Parker is an 84 y.o. female with multiple medical problems including COPD, hypertension, hyperlipidemia, CKD, and stage IV lung cancer s/p chemo (ultimately discontinued due to poor tolerance).  Patient was hospitalized 07/23/2020-07/30/2020 with pneumonia.   She ultimately discharged to rehab and has since returned home with home health.  Patient presents to [facility] today for evaluation of 3 days of exertional shortness of breath and productive cough.  Patient reports having occasional wheezing.  She denies fever or chills.  She has been taking Mucinex, which has helped some.  She continues to take her Trelegy inhaler but has not required rescue albuterol.  Denies any neurologic complaints. Denies recent fevers or illnesses. Denies any easy bleeding or bruising. Reports poor appetite. Denies chest pain. Denies any nausea, vomiting, constipation, or diarrhea. Denies urinary complaints. Patient offers no further specific complaints today.    PAST MEDICAL HISTORY: Past Medical History:  Diagnosis Date  . Benign liver cyst   . Breast cancer ([place]) 2005   right breast  . COPD (chronic obstructive pulmonary disease) ([place])   . Dyspnea   . Glaucoma   . Hemoptysis 2019  . Hypertension    Not on medication for htn. patient denies this  . Lung mass 01/2018  . Personal history of radiation  therapy     PAST SURGICAL HISTORY:  Past Surgical History:  Procedure Laterality Date  . BREAST EXCISIONAL BIOPSY Right 2005   positive, radiation  . BREAST LUMPECTOMY Right 03/2004  . CHOLECYSTECTOMY  1988  . COLONOSCOPY  2012  . ENDOBRONCHIAL ULTRASOUND N/A 02/21/2018   Procedure: ENDOBRONCHIAL ULTRASOUND;  Surgeon: Laverle Hobby, MD;  Location: [facility] ORS;  Service: Pulmonary;  Laterality: N/A;  . EYE SURGERY Bilateral 2009,2010   cataract extractions with lens implant  . GALLBLADDER SURGERY Bilateral   . IR FLUORO GUIDE PORT INSERTION RIGHT  03/10/2018  . JOINT REPLACEMENT Left 2008   left hip replacement  . PORTACATH PLACEMENT  03/10/2018  . TONSILLECTOMY  1960    HEMATOLOGY/ONCOLOGY HISTORY:  Oncology History Overview Note  # April 2019- RUL LUNG CANCER-non-small cell; favor squamous cell; PET scan T4N0 [right upper lobe mass involving[mediastinal/blood results] vs small pleural effusion [?M1]- CARBO-TAXOL- RT;   #October 2020-CT progressive subcentimeter right-sided lung lesions;  # NOV 17th 2020- LTJQZESP q3 W  # 24th FEB 2021- Carbo-Taxol weekly; # 5 treatments.  March 30-2021 progressive disease liver lesion; stable lung.  #February 01, 2020-gemcitabine [800 mg/m]  # Hx of Right breast ca [s/p lumpec; RT; No chemo; "pill" x5 years]  # COPD/Hx of smoking  # PALLIATIVE CARE: 12/19/2019- DELCINED  MOLECULAR TESTING/OMNISEQ: PDL-1 25% [TPS; TMB-H; No targets**]  --------------------------------------------------    DIAGNOSIS: [ April 2019]SQUAMOUS CELL LUNG CA  STAGE: IV/Recurrent ;GOALS: PALLIATIVE  CURRENT/MOST RECENT THERAPY-  gemcitabine [C] .     Primary cancer of right upper lobe of lung ([place])  03/13/2018 - 05/08/2018 Chemotherapy   The patient had dexamethasone (DECADRON) 4 MG  tablet, 8 mg, Oral, Daily, 1 of 1 cycle, Start date: --, End date: -- palonosetron (ALOXI) injection 0.25 mg, 0.25 mg, Intravenous,  Once, 9 of 9 cycles Administration: 0.25  mg (03/13/2018), 0.25 mg (04/10/2018), 0.25 mg (04/17/2018), 0.25 mg (03/21/2018), 0.25 mg (04/25/2018), 0.25 mg (03/27/2018), 0.25 mg (04/03/2018), 0.25 mg (05/01/2018), 0.25 mg (05/08/2018) CARBOplatin (PARAPLATIN) 130 mg in sodium chloride 0.9 % 250 mL chemo infusion, 130 mg (100 % of original dose 131.4 mg), Intravenous,  Once, 9 of 9 cycles Dose modification:   (original dose 131.4 mg, Cycle 1) Administration: 130 mg (03/13/2018), 130 mg (04/10/2018), 130 mg (04/17/2018), 130 mg (03/21/2018), 130 mg (04/25/2018), 130 mg (03/27/2018), 130 mg (04/03/2018), 130 mg (05/01/2018), 130 mg (05/08/2018) PACLitaxel (TAXOL) 78 mg in sodium chloride 0.9 % 250 mL chemo infusion (</= 40m/m2), 45 mg/m2 = 78 mg, Intravenous,  Once, 9 of 9 cycles Administration: 78 mg (03/13/2018), 78 mg (04/10/2018), 78 mg (04/17/2018), 78 mg (03/21/2018), 78 mg (04/25/2018), 78 mg (03/27/2018), 78 mg (04/03/2018), 78 mg (05/01/2018), 78 mg (05/08/2018)  for chemotherapy treatment.    09/12/2019 - 11/14/2019 Chemotherapy   The patient had pembrolizumab (KEYTRUDA) 200 mg in sodium chloride 0.9 % 50 mL chemo infusion, 200 mg, Intravenous, Once, 4 of 6 cycles Administration: 200 mg (09/12/2019), 200 mg (10/03/2019), 200 mg (10/24/2019), 200 mg (11/14/2019)  for chemotherapy treatment.    12/19/2019 - 01/18/2020 Chemotherapy   The patient had dexamethasone (DECADRON) 4 MG tablet, 8 mg, Oral, Daily, 1 of 1 cycle, Start date: --, End date: -- palonosetron (ALOXI) injection 0.25 mg, 0.25 mg, Intravenous,  Once, 5 of 8 cycles Administration: 0.25 mg (12/19/2019), 0.25 mg (12/28/2019), 0.25 mg (01/18/2020), 0.25 mg (01/04/2020), 0.25 mg (01/11/2020) CARBOplatin (PARAPLATIN) 120 mg in sodium chloride 0.9 % 100 mL chemo infusion, 120 mg (100 % of original dose 122 mg), Intravenous,  Once, 5 of 8 cycles Dose modification:   (original dose 122 mg, Cycle 1) Administration: 120 mg (12/19/2019), 120 mg (12/28/2019), 120 mg (01/18/2020), 120 mg (01/04/2020), 120 mg (01/11/2020) PACLitaxel  (TAXOL) 138 mg in sodium chloride 0.9 % 250 mL chemo infusion (</= 830mm2), 80 mg/m2 = 138 mg, Intravenous,  Once, 5 of 8 cycles Administration: 138 mg (12/19/2019), 138 mg (12/28/2019), 138 mg (01/18/2020), 138 mg (01/04/2020), 138 mg (01/11/2020)  for chemotherapy treatment.    02/01/2020 -  Chemotherapy   The patient had gemcitabine (GEMZAR) 1,400 mg in sodium chloride 0.9 % 250 mL chemo infusion, 1,368 mg (100 % of original dose 800 mg/m2), Intravenous,  Once, 3 of 4 cycles Dose modification: 800 mg/m2 (original dose 800 mg/m2, Cycle 1, Reason: Provider Judgment) Administration: 1,400 mg (02/01/2020), 1,400 mg (02/08/2020), 1,400 mg (02/22/2020), 1,400 mg (02/29/2020), 1,400 mg (03/21/2020), 1,400 mg (03/14/2020)  for chemotherapy treatment.      ALLERGIES:  is allergic to penicillins.  MEDICATIONS:  Current Outpatient Medications  Medication Sig Dispense Refill  . albuterol (PROVENTIL HFA) 108 (90 Base) MCG/ACT inhaler Inhale 2 puffs into the lungs every 6 (six) hours as needed for wheezing or shortness of breath.     . Cholecalciferol (VITAMIN D3) 1000 units CAPS Take 2,000 Units by mouth daily.     . fluocinolone (SYNALAR) 0.01 % external solution Apply topically 2 (two) times daily.    . Marland Kitchenabapentin (NEURONTIN) 100 MG capsule Take 100 mg by mouth 3 (three) times daily.     . Marland KitchenuaiFENesin (MUCINEX) 600 MG 12 hr tablet Take 600 mg by mouth 2 (two) times daily.    .Marland Kitchen  latanoprost (XALATAN) 0.005 % ophthalmic solution Place 1 drop into both eyes at bedtime.     . ondansetron (ZOFRAN) 8 MG tablet Take 8 mg by mouth every 8 (eight) hours as needed for nausea or vomiting.    Marland Kitchen oxyCODONE (OXY IR/ROXICODONE) 5 MG immediate release tablet Take 1 tablet (5 mg total) by mouth 2 (two) times daily as needed for moderate pain or severe pain. 12 tablet 0  . TRELEGY ELLIPTA 100-62.5-25 MCG/INH AEPB Take 1 puff by mouth daily.      No current facility-administered medications for this visit.   Facility-Administered  Medications Ordered in Other Visits  Medication Dose Route Frequency Provider Last Rate Last Admin  . sodium chloride flush (NS) 0.9 % injection 10 mL  10 mL Intravenous PRN [name], MD   10 mL at 05/02/20 0905    VITAL SIGNS: BP 117/64 (Patient Position: Sitting)   Pulse 88   Temp (!) 95.8 F (35.4 C) (Tympanic)   Resp (!) 22   Ht 5' 4" (1.626 m)   Wt 130 lb 6.4 oz (59.1 kg)   SpO2 95%   BMI 22.38 kg/m  Filed Weights   09/04/20 0944  Weight: 130 lb 6.4 oz (59.1 kg)    Estimated body mass index is 22.38 kg/m as calculated from the following:   Height as of this encounter: 5' 4" (1.626 m).   Weight as of this encounter: 130 lb 6.4 oz (59.1 kg).  LABS: CBC:    Component Value Date/Time   WBC 11.1 (H) 09/04/2020 0915   HGB 9.8 (L) 09/04/2020 0915   HCT 31.8 (L) 09/04/2020 0915   PLT 494 (H) 09/04/2020 0915   MCV 89.6 09/04/2020 0915   NEUTROABS 9.1 (H) 09/04/2020 0915   LYMPHSABS 0.8 09/04/2020 0915   MONOABS 1.0 09/04/2020 0915   EOSABS 0.1 09/04/2020 0915   BASOSABS 0.0 09/04/2020 0915   Comprehensive Metabolic Panel:    Component Value Date/Time   NA 140 09/04/2020 0915   K 3.6 09/04/2020 0915   CL 104 09/04/2020 0915   CO2 26 09/04/2020 0915   BUN 23 09/04/2020 0915   CREATININE 1.04 (H) 09/04/2020 0915   GLUCOSE 104 (H) 09/04/2020 0915   CALCIUM 9.3 09/04/2020 0915   AST 15 09/04/2020 0915   ALT 8 09/04/2020 0915   ALKPHOS 85 09/04/2020 0915   BILITOT 0.5 09/04/2020 0915   PROT 8.7 (H) 09/04/2020 0915   ALBUMIN 2.8 (L) 09/04/2020 0915    RADIOGRAPHIC STUDIES: DG Chest 2 View  Result Date: 09/04/2020 CLINICAL DATA:  Cough and shortness of breath for 1-2 weeks, history of primary lung cancer RIGHT upper lobe, past history breast cancer, COPD, hypertension, former smoker EXAM: CHEST - 2 VIEW COMPARISON:  07/28/2020 FINDINGS: LEFT jugular Port-A-Cath with tip projecting over SVC. Normal heart size, mediastinal contours, and pulmonary  vascularity. Atherosclerotic calcification aorta. Large cavitary mass in RIGHT upper lobe 7.2 cm diameter with minimal dependent fluid. Streaky atelectasis versus scarring in remaining RIGHT lung. Emphysematous changes with subsegmental atelectasis versus infiltrate at LEFT base. No pleural effusion or pneumothorax. Bones demineralized. IMPRESSION: Cavitary mass RIGHT upper lobe 7.2 cm diameter. Chronic RIGHT lung changes with atelectasis versus infiltrate at LEFT base. Aortic Atherosclerosis (ICD10-I70.0) and Emphysema (ICD10-J43.9). Electronically Signed   By: Lavonia Dana M.D.   On: 09/04/2020 09:07    PERFORMANCE STATUS (ECOG) : 2 - Symptomatic, <50% confined to bed  Review of Systems Unless otherwise noted, a complete review of systems is  negative.  Physical Exam General: NAD, thin Cardiovascular: regular rate and rhythm Pulmonary: clear ant/posterior fields Abdomen: soft, nontender, + bowel sounds GU: no suprapubic tenderness Extremities: no edema, no joint deformities Skin: no rashes Neurological: Weakness but otherwise nonfocal  Assessment and Plan- Patient is a 84 y.o. female  COPD, hypertension, hyperlipidemia, CKD, and stage IV lung cancer s/p chemo, who was recently hospitalized for pneumonia and presents to the [facility] today for evaluation of shortness of breath and cough.  Patient was accompanied by her son  Pneumonia-chest x-ray today reveals possible atelectasis versus infiltrate in left lung base.  This could be residual from previous pneumonia as this chest x-ray overall appears improved from 1 month ago.  Patient does not have significant leukocytosis, fever, or chills.  No significant hypoxia in clinic either at rest or with ambulation.  However, given frailty, will proceed with antibiotics and symptomatic care.  Will start on azithromycin, benzonatate, and prednisone.  Continue inhalers.  Recommended as needed use of oxygen, which patient already has in the home.  Also  recommended increased fluids and oral nutritional supplements.  Patient is followed by home health.  Will plan virtual visit in 1 week.  Case and plan discussed with Dr. Rogue Bussing.   Patient expressed understanding and was in agreement with this plan. She also understands that She can call clinic at any time with any questions, concerns, or complaints.   Thank you for allowing me to participate in the care of this very pleasant patient.   Time Total: 25 minutes  Visit consisted of counseling and education dealing with the complex and emotionally intense issues of symptom management and palliative care in the setting of serious and potentially life-threatening illness.Greater than 50%  of this time was spent counseling and coordinating care related to the above assessment and plan.  Signed by: Altha Harm, PhD, NP-C

## 2020-09-11 ENCOUNTER — Other Ambulatory Visit: Payer: Self-pay

## 2020-09-11 ENCOUNTER — Inpatient Hospital Stay (HOSPITAL_BASED_OUTPATIENT_CLINIC_OR_DEPARTMENT_OTHER): Payer: Medicare Other | Admitting: Hospice and Palliative Medicine

## 2020-09-11 DIAGNOSIS — Z515 Encounter for palliative care: Secondary | ICD-10-CM | POA: Diagnosis not present

## 2020-09-11 DIAGNOSIS — C3411 Malignant neoplasm of upper lobe, right bronchus or lung: Secondary | ICD-10-CM | POA: Diagnosis not present

## 2020-09-11 NOTE — Progress Notes (Signed)
Virtual Visit via Telephone Note  I connected with son for  Brenda Parker on 09/11/20 at  3:00 PM EST by telephone and verified that I am speaking with the correct person using two identifiers.  Location: Patient: home Provider: clinic   I discussed the limitations, risks, security and privacy concerns of performing an evaluation and management service by telephone and the availability of in person appointments. I also discussed with the patient that there may be a patient responsible charge related to this service. The patient expressed understanding and agreed to proceed.   History of Present Illness: Patient is a 84 y.o. female  COPD, hypertension, hyperlipidemia, CKD, and stage IV lung cancer s/p chemo, who was recently hospitalized for pneumonia and presents to the [facility] today for evaluation of shortness of breath and cough.   Observations/Objective: I spoke with patient's son by phone.  He reports that patient's respiratory symptoms have improved with antibiotics and steroids.  However, he has noted worsening memory and incontinence issues.  Son asked about hospice involvement, which we discussed in detail.  He would like to proceed with hospice referral.  Assessment and Plan: Stage IV lung cancer -referral to hospice at home. Order for hospice was called into [facility].   Case and plan discussed with Dr. Rogue Bussing  Follow Up Instructions: As needed   I discussed the assessment and treatment plan with the patient. The patient was provided an opportunity to ask questions and all were answered. The patient agreed with the plan and demonstrated an understanding of the instructions.   The patient was advised to call back or seek an in-person evaluation if the symptoms worsen or if the condition fails to improve as anticipated.  I provided 10 minutes of non-face-to-face time during this encounter.   Irean Hong, NP

## 2020-09-15 ENCOUNTER — Telehealth: Payer: Self-pay | Admitting: Internal Medicine

## 2020-09-15 ENCOUNTER — Other Ambulatory Visit: Payer: Self-pay

## 2020-09-15 ENCOUNTER — Other Ambulatory Visit: Payer: Medicare Other | Admitting: Primary Care

## 2020-09-15 ENCOUNTER — Other Ambulatory Visit: Payer: Self-pay | Admitting: Primary Care

## 2020-09-15 DIAGNOSIS — M5432 Sciatica, left side: Secondary | ICD-10-CM

## 2020-09-15 DIAGNOSIS — E440 Moderate protein-calorie malnutrition: Secondary | ICD-10-CM

## 2020-09-15 DIAGNOSIS — C3411 Malignant neoplasm of upper lobe, right bronchus or lung: Secondary | ICD-10-CM

## 2020-09-15 DIAGNOSIS — Z515 Encounter for palliative care: Secondary | ICD-10-CM

## 2020-09-15 NOTE — Progress Notes (Signed)
[facility] Palliative Care Consult Note Telephone: [phone]  Fax: [phone]     Date of encounter: 09/15/20 PATIENT NAME: Brenda Parker [address] Brenda Parker [place] [address] [phone] (home)  DOB: 04/21/1936 MRN: [id number]  PRIMARY CARE PROVIDER:    Leonel Ramsay, MD,  [place] [address] [phone]  REFERRING PROVIDER:   Leonel Ramsay, MD Brenda Parker,  [place] [address] [phone]  RESPONSIBLE PARTY:   Extended Emergency Contact Information Primary Emergency Contact: Brenda Parker  [place] Mobile Phone: [phone] Relation: Son  I met face to face with patient and friends in the home. Palliative Care was asked to follow this patient by consultation request of Brenda Ramsay, MD to help address advance care planning and goals of care. This is a follow up   visit.   ASSESSMENT AND RECOMMENDATIONS:   1. Advance Care Planning/Goals of Care: Goals include to maximize quality of life and symptom management. Our advance care planning conversation included a discussion about:      Experiences with loved ones who have been seriously ill or have died - pt's husband died on hospice  Exploration of personal, cultural or spiritual beliefs that might influence medical decisions - son is chief Media planner  Exploration of goals of care in the event of a sudden injury or illness- wants to be at home as her disease worsens   Decision to  de-escalate disease focused treatments due to poor prognosis.  I met with patient today for advance care planning and complex medical management. . We discussed her escalating care needs. She is still living alone but finds it harder and harder to perform her ADLs. She states she does not eat because she's so exhausted by the time she walks to the kitchen that she can't cook any food.   We discussed the services of hospice as she and  her son have been discussing when was it time to choose hospice, for several months. Some friends are in attendance with her today and voiced that they thought she could benefit from the assistance of hospice. Patient continually defers to son but does state that she would appreciate help in these areas.   She states she knows that her life is limited that her quality of life is poor and for whatever time she had she is not enjoying life right now. She asked me to call her son Brenda Parker.  By phone Brenda Parker states they have elected to de- escalate care and chose hospice services. ---------------------------------------------------------------------------------------------------  2. Symptom Management:   COPD: has Trilogy, takes daily. Has albuterol but cannot locate inhaler. No nebs in home. Pt states she's never had nebs  But may benefit as she is very winded after ambulation.  Pain: States pain in bones from weather change.Patient needs her pain medicine increased. She's currently on 5 mg of oxycodone twice a day and endorses moderate to severe pain most of the time. She states she doesn't want to take too much. I discussed adding OTC analgesia and that due to her disease process increasing her pain medicine is completely reasonable. She would benefit from q 4 hr dosing, and perhaps ultimately a long acting preparation. I will send a message to her oncology team to discuss. She's clearly very painful.   Constipation: Senna daily recommended for constipation, which has worsened with narcotic. Education given to take enough laxative for qod soft formed stool.  Nutrition: Poor, due to  poor intake and cancer process. Albumin 2.8. Endorse fatigue that interrupts ability to get meals and eat. Does not like the taste of the supplemental drinks.  Mobility: Using walker for ambulation. Ambulates during this visit with oxygen. She also has fallen off the couch where she sleeps at hs. We discussed a hospital bed but  she was not interested. She has a side rail for her bed on one side but endorse fear of falling out of bed at hs. Encouraged to consider hospital bed with half rails for better sleep, positioning.  Hospice planning: Today we discussed patient   And made a decision to enroll in hospice.  3. Follow up Palliative Care Visit: Refer to hospice for admission   I spent 20 minutes providing this consultation,  from 1420 to 1440. More than 50% of the time in this consultation was spent coordinating communication.   CODE STATUS:DNR  PPS: 40%  HOSPICE ELIGIBILITY/DIAGNOSIS: yes/lung cancer  Subjective:  CHIEF COMPLAINT: pain  HISTORY OF PRESENT ILLNESS:  Brenda Parker is a 84 y.o. year old female  Reports pain in the context of Metastatic lung cancer, 8/10 in severity,  aching,  constant, poorly controlled on current regimen. She also has constipation as result of new narcotic. She endorses SOB  and DOE which is worsening and limiting her endurance and ability for adls.   We are asked to consult around advance care planning and complex medical decision making.  Review or lab tests shows albumin at 2.8 indication of poor nutrition. Patient endorses poor appetite. Review of case with family member Brenda Parker who recounts her recent decline in adl ability, and increasing symptoms.  History obtained from review of EMR, discussion with primary team, and  interview with family, caregiver  and/or Ms. Brenda Parker. Records reviewed and summarized above.     CURRENT PROBLEM LIST:  Patient Active Problem List   Diagnosis Date Noted  . Palliative care encounter   . Acute on chronic respiratory failure with hypoxia (Brenda Parker) 07/23/2020  . Goals of care, counseling/discussion 09/03/2019  . Malnutrition of moderate degree 07/10/2018  . Multifocal pneumonia 07/07/2018  . Pneumonia 06/28/2018  . Primary cancer of right upper lobe of lung (Brenda Parker) 03/03/2018  . Hyperlipidemia, unspecified 02/08/2018  . Hypertension  02/08/2018  . Chronic cough 08/05/2017  . Lesion of liver 07/04/2017  . Abnormal CXR 07/01/2017  . Psoriasis of scalp 12/22/2015  . Back pain with left-sided sciatica 06/16/2015  . 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 Impaired fasting glucose 12/13/2012  . Chronic kidney disease 03/28/2012  . Seborrheic dermatitis 03/28/2012   PAST MEDICAL HISTORY:  Active Ambulatory Problems    Diagnosis Date Noted  . Abnormal CXR 07/01/2017  . Back pain with left-sided sciatica 06/16/2015  . Chronic cough 08/05/2017  . Chronic kidney disease 03/28/2012  . Seborrheic dermatitis 03/28/2012  . 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 Hyperlipidemia, unspecified 02/08/2018  . Hypertension 02/08/2018  . Impaired fasting glucose 12/13/2012  . Lesion of liver 07/04/2017  . Psoriasis of scalp 12/22/2015  . Primary cancer of right upper lobe of lung ([place]) 03/03/2018  . Pneumonia 06/28/2018  . Multifocal pneumonia 07/07/2018  . Malnutrition of moderate degree 07/10/2018  . Goals of care, counseling/discussion 09/03/2019  . Acute on chronic respiratory failure with hypoxia ([place]) 07/23/2020  . Palliative care encounter    Resolved Ambulatory Problems    Diagnosis Date Noted  . No Resolved Ambulatory Problems   Past Medical History:  Diagnosis Date  . Benign liver cyst   .  Breast cancer ([facility]) 2005  . COPD (chronic obstructive pulmonary disease) ([facility])   . Dyspnea   . Glaucoma   . Hemoptysis 2019  . Lung mass 01/2018  . Personal history of radiation therapy    SOCIAL HX:  Social History   Tobacco Use  . Smoking status: Former Smoker    Packs/day: 1.00    Types: Cigarettes    Quit date: 10/25/2005    Years since quitting: 14.9  . Smokeless tobacco: Never Used  Substance Use Topics  . Alcohol use: Not Currently    Comment: occasional glass of beer   FAMILY HX:  Family History  Problem Relation Age of Onset  . Breast cancer Mother 92  . CAD Father     ALLERGIES:  Allergies  Allergen Reactions   . Penicillins Rash    Patient had a rash after an injection in 1973.  Unsure if taken orally will cause any reaction  Has patient had a PCN reaction causing immediate rash, facial/tongue/throat swelling, SOB or lightheadedness with hypotension: Yes Has patient had a PCN reaction causing severe rash involving mucus membranes or skin necrosis: No Has patient had a PCN reaction that required hospitalization: No Has patient had a PCN reaction occurring within the last 10 years: No      PERTINENT MEDICATIONS:  Outpatient Encounter Medications as of 09/15/2020  Medication Sig  . albuterol (PROVENTIL HFA) 108 (90 Base) MCG/ACT inhaler Inhale 2 puffs into the lungs every 6 (six) hours as needed for wheezing or shortness of breath.   Marland Kitchen azithromycin (ZITHROMAX Z-PAK) 250 MG tablet Take two tablets by mouth (559m) x 1 day, then take one tablet (2558m by mouth x 4 days  . benzonatate (TESSALON) 100 MG capsule Take 1 capsule (100 mg total) by mouth 3 (three) times daily as needed for cough.  . Cholecalciferol (VITAMIN D3) 1000 units CAPS Take 2,000 Units by mouth daily.   . fluocinolone (SYNALAR) 0.01 % external solution Apply topically 2 (two) times daily.  . Marland Kitchenabapentin (NEURONTIN) 100 MG capsule Take 100 mg by mouth 3 (three) times daily.   . Marland KitchenuaiFENesin (MUCINEX) 600 MG 12 hr tablet Take 600 mg by mouth 2 (two) times daily.  . Marland Kitchenatanoprost (XALATAN) 0.005 % ophthalmic solution Place 1 drop into both eyes at bedtime.   . ondansetron (ZOFRAN) 8 MG tablet Take 8 mg by mouth every 8 (eight) hours as needed for nausea or vomiting.  . Marland KitchenxyCODONE (OXY IR/ROXICODONE) 5 MG immediate release tablet Take 1 tablet (5 mg total) by mouth 2 (two) times daily as needed for moderate pain or severe pain.  . predniSONE (DELTASONE) 10 MG tablet Take two tablets (2075mby mouth x 3 days, then take one tablet (7m70m 3 days, then half tablet (5mg)21m3 days, then stop  . TRELEGY ELLIPTA 100-62.5-25 MCG/INH AEPB Take 1 puff  by mouth daily.    Facility-Administered Encounter Medications as of 09/15/2020  Medication  . sodium chloride flush (NS) 0.9 % injection 10 mL    Objective: ROS  General: endorses pain ENMT: denies dysphagia Cardiovascular: denies chest pain Pulmonary: endorses dry cough, denies increased SOB, DOE, uses oxygen Abdomen: endorses fair to poor appetite, endorses occ  constipation, endorses continence of bowel GU: denies dysuria, endorses continence of urine MSK:  endorses ROM limitations, no falls reported Skin: denies rashes or wounds Neurological: endorses weakness, endorses  8/10  pain, endorses insomnia Psych: Endorses depressed mood  Physical Exam: Current and past weights: 9/21 140 lbs,  09/04/20 130 lbs, 8% loss in 2 months..Was 180 lbs at base line several years ago.  Constitutional: NAD General :frail appearing, thin EYES: anicteric sclera,lids intact, no discharge  ENMT: intact hearing,oral mucous membranes moist, dentition intact CV:no LE edema Pulmonary: LCTA, no increased work of breathing, occ cough, room air Abdomen: intake 25%,  no ascites GU: deferred MSK: severe sacropenia, decreased ROM in all extremities, no contractures of LE,  Ambulatory with walker Skin: warm and dry, no rashes or wounds on visible skin Neuro:  Increased fatigue and Weakness, mild cognitive impairment, grossly non -focal Psych: anxious affect, A and O x 2-3, forgetful at times Hem/lymph/immuno: no widespread bruising   Thank you for the opportunity to participate in the care of Ms. Brenda Parker.  The palliative care team will continue to follow. Please call our office at [phone] if we can be of additional assistance.  Jason Coop, NP , DNP, MPH, AGPCNP-BC, ACHPN  COVID-19 PATIENT SCREENING TOOL  Person answering questions: ____________self______ _____   1.  Is the patient or any family member in the home showing any signs or symptoms regarding respiratory infection?                Person with Symptom- __________NA_________________  a. Fever                                                                          Yes___ No___          ___________________  b. Shortness of breath                                                    Yes___ No___          ___________________ c. Cough/congestion                                       Yes___  No___         ___________________ d. Body aches/pains                                                         Yes___ No___        ____________________ e. Gastrointestinal symptoms (diarrhea, nausea)           Yes___ No___        ____________________  2. Within the past 14 days, has anyone living in the home had any contact with someone with or under investigation for COVID-19?    Yes___ No_X_   Person __________________

## 2020-09-15 NOTE — Telephone Encounter (Signed)
on 11/19-called the patient's son to check on patient.  Patient noted to have gradual decline.  Awaiting hospice evaluation.

## 2020-09-22 ENCOUNTER — Telehealth: Payer: Self-pay | Admitting: *Deleted

## 2020-09-22 MED ORDER — OXYCODONE HCL 5 MG PO TABS: 5.0000 mg | ORAL_TABLET | ORAL | 0 refills | Status: AC | PRN

## 2020-09-22 MED ORDER — DEXAMETHASONE 2 MG PO TABS: 2.0000 mg | ORAL_TABLET | Freq: Every day | ORAL | 0 refills | Status: AC

## 2020-09-22 NOTE — Telephone Encounter (Signed)
I spoke with patient's hospice nurse. Patient has required increased oxycodone due to worsened generalized pain.  Oxycodone dose was increased to 10 mg over the weekend by on-call hospice provider.  We will resend refill today.  Reportedly, son has also requested restarting low-dose steroids due to poor appetite and fatigue.  This is reasonable given goal of quality of life under hospice.  We will start patient on low-dose dexamethasone.

## 2020-09-22 NOTE — Telephone Encounter (Signed)
Hospice nurse called stating that family has asked for something to stimulate patient appetite and she requests an order for this. She also states that she did not receive a return call from On Call physician on Thursday for pain medicine refill and had to get some ordered form [employer]. Patient is in need of a refill on her Oxycodone. Please return her call [phone]

## 2020-09-29 ENCOUNTER — Telehealth: Payer: Self-pay | Admitting: *Deleted

## 2020-09-29 ENCOUNTER — Other Ambulatory Visit: Payer: Self-pay | Admitting: Hospice and Palliative Medicine

## 2020-09-29 MED ORDER — LORAZEPAM 0.5 MG PO TABS: 0.5000 mg | ORAL_TABLET | ORAL | 0 refills | Status: AC | PRN

## 2020-09-29 MED ORDER — MORPHINE SULFATE ER 15 MG PO TBCR: 15.0000 mg | EXTENDED_RELEASE_TABLET | Freq: Two times a day (BID) | ORAL | 0 refills | Status: AC

## 2020-09-29 NOTE — Telephone Encounter (Signed)
Hospice nurse Marliss Czar [phone] called requesting a return call for orders for Ativan and pain medicine changes as what she is on is not controlling her pain

## 2020-09-29 NOTE — Telephone Encounter (Signed)
Spoke with patient's hospice nurse.  Patient has had persistent generalized pain despite taking oxycodone 10 mg every 3-4 hours around-the-clock.  Patient was also started over the weekend on morphine elixir, which she has taken several times in between oxycodone doses.  Will start patient on MS Contin 15 mg every 12 hours, with plan to increase to every 8 hour dosing if needed.  Patient reportedly also has some anxiety.  Will start lorazepam 0.5 mg p.o. every 4 hours as needed

## 2020-10-06 ENCOUNTER — Telehealth: Payer: Self-pay | Admitting: Internal Medicine

## 2020-10-06 NOTE — Telephone Encounter (Signed)
On 12/10-called the patient's son to offer my condolences.  Appreciates the phone call/care offered at the cancer center

## 2020-10-25 DEATH — deceased

## 2021-06-04 ENCOUNTER — Ambulatory Visit: Payer: Medicare Other | Admitting: Radiation Oncology
